# Patient Record
Sex: Male | Born: 1940
Health system: Southern US, Community
[De-identification: ages and names within clinical notes are randomized; demographics above are authoritative.]

## PROBLEM LIST (undated history)

## (undated) DIAGNOSIS — M069 Rheumatoid arthritis, unspecified: Secondary | ICD-10-CM

## (undated) DIAGNOSIS — K219 Gastro-esophageal reflux disease without esophagitis: Secondary | ICD-10-CM

## (undated) DIAGNOSIS — R55 Syncope and collapse: Secondary | ICD-10-CM

## (undated) DIAGNOSIS — E785 Hyperlipidemia, unspecified: Secondary | ICD-10-CM

## (undated) DIAGNOSIS — D7589 Other specified diseases of blood and blood-forming organs: Secondary | ICD-10-CM

## (undated) DIAGNOSIS — G4733 Obstructive sleep apnea (adult) (pediatric): Secondary | ICD-10-CM

## (undated) DIAGNOSIS — Z86010 Personal history of colonic polyps: Secondary | ICD-10-CM

## (undated) DIAGNOSIS — Z860101 Personal history of adenomatous and serrated colon polyps: Secondary | ICD-10-CM

## (undated) DIAGNOSIS — E669 Obesity, unspecified: Secondary | ICD-10-CM

## (undated) DIAGNOSIS — I6523 Occlusion and stenosis of bilateral carotid arteries: Secondary | ICD-10-CM

## (undated) DIAGNOSIS — D638 Anemia in other chronic diseases classified elsewhere: Secondary | ICD-10-CM

## (undated) DIAGNOSIS — R42 Dizziness and giddiness: Secondary | ICD-10-CM

## (undated) DIAGNOSIS — K2270 Barrett's esophagus without dysplasia: Secondary | ICD-10-CM

## (undated) HISTORY — DX: Barrett's esophagus without dysplasia: K22.70

## (undated) HISTORY — DX: Anemia in other chronic diseases classified elsewhere: D63.8

## (undated) HISTORY — DX: Obesity, unspecified: E66.9

## (undated) HISTORY — DX: Occlusion and stenosis of bilateral carotid arteries: I65.23

## (undated) HISTORY — DX: Dizziness and giddiness: R42

## (undated) HISTORY — DX: Personal history of adenomatous and serrated colon polyps: Z86.0101

## (undated) HISTORY — DX: Hyperlipidemia, unspecified: E78.5

## (undated) HISTORY — DX: Obstructive sleep apnea (adult) (pediatric): G47.33

## (undated) HISTORY — DX: Gastro-esophageal reflux disease without esophagitis: K21.9

## (undated) HISTORY — DX: Other specified diseases of blood and blood-forming organs: D75.89

## (undated) HISTORY — DX: Syncope and collapse: R55

## (undated) HISTORY — DX: Personal history of colonic polyps: Z86.010

---

## 2001-10-10 ENCOUNTER — Ambulatory Visit (HOSPITAL_COMMUNITY): Admission: RE | Admit: 2001-10-10 | Discharge: 2001-10-10 | Payer: Self-pay | Admitting: *Deleted

## 2002-03-02 HISTORY — PX: JOINT REPLACEMENT: SHX530

## 2003-10-08 ENCOUNTER — Ambulatory Visit (HOSPITAL_COMMUNITY): Admission: RE | Admit: 2003-10-08 | Discharge: 2003-10-08 | Payer: Self-pay | Admitting: Emergency Medicine

## 2008-01-09 ENCOUNTER — Inpatient Hospital Stay (HOSPITAL_COMMUNITY): Admission: RE | Admit: 2008-01-09 | Discharge: 2008-01-11 | Payer: Self-pay | Admitting: Orthopedic Surgery

## 2010-02-25 DIAGNOSIS — C4492 Squamous cell carcinoma of skin, unspecified: Secondary | ICD-10-CM

## 2010-02-25 HISTORY — DX: Squamous cell carcinoma of skin, unspecified: C44.92

## 2010-07-15 NOTE — Op Note (Signed)
NAME:  Nicholas Webb, Nicholas Webb NO.:  [id number]   MEDICAL RECORD NO.:  [id number]          PATIENT TYPE:  INP   LOCATION:  5015                         FACILITY:  [facility]   PHYSICIAN:  Mila Homer. Sherlean Foot, M.D. DATE OF BIRTH:  10/31/1940   DATE OF PROCEDURE:  01/09/2008  DATE OF DISCHARGE:                               OPERATIVE REPORT   SURGEON:  Mila Homer. Sherlean Foot, MD   ASSISTANTS:  1. Laural Benes. Su Hilt, PA-C  2. Altamese Cabal, PA-C   ANESTHESIA:  General.   PREOPERATIVE DIAGNOSIS:  Left knee osteoarthritis.   POSTOPERATIVE DIAGNOSIS:  Left knee osteoarthritis.   PROCEDURE:  Left total knee arthroplasty.   INDICATIONS FOR PROCEDURE:  The patient is a 70 year old male who failed  conservative measures for left knee osteoarthritis.   DESCRIPTION OF PROCEDURE:  The patient was laid supine, administered  general anesthesia, and placed in the supine position on the operating  room table.  Foley catheter was placed.  Left leg was prepped and draped  in the usual sterile fashion.  The extremity was exsanguinated with the  Esmarch and tourniquet was inflated to 350 mmHg and set for an hour.  I  then made a midline incision with #10 blade.  Used a new blade to make a  medial parapatellar arthrotomy and performed synovectomy.  There was  lots of synovitis.  This appeared to be either a rheumatoid versus gout  or pseudogout or some other source of systemic arthritis.  I everted the  patella, measured 25 mm thick.  I reamed down to 16 and drilled 3 lug  holes with 35-mm template and had recreated a 25-mm thickness.  I  removed the trial component, went into flexion subluxing the patella  laterally.  I used the extramedullary alignment system on the tibia and  intramedullary alignment on the femur, made a 6-degree valgus cut on the  femur, perpendicular 9-degree cut on the tibia.  I then drew out the  epicondylar axis, posterior condylar angle measured 3 degrees.  I then  sized to  a size G on the femur, pinned to 3-degree external rotation  holes, and fastened the 4:1 cutting block to the femur and made the  anterior-posterior chamfer cuts with sagittal saw.  I then used the 6  tibia, drill and keel.  I then trialed with a 6 tibia, G femur after  finishing the femur with the G finishing block.  I trialed various  polyethylenes and felt a size 12 gave perfect flexion/extension gap  balance.  I then took the knee through range of motion and had to do a  small lateral release.  I then chose these components.  I removed the  trial components and copiously irrigated.  I then cemented in the  components removing all excess cement and allowing the cement to harden  in extension.  I placed a Hemovac coming out superolaterally deep to the  arthrotomy, pain catheter coming out superficial to the arthrotomy and  superomedially.  I closed the arthrotomy with figure-of-eight #1 Vicryl  sutures, buried 0 Vicryl sutures, subcuticular 2-0 Vicryl stitches, and  skin  staples.  Dressed with Xeroform, dry sponge, sterile Webril, and  TED stocking.   COMPLICATIONS:  None.   DRAINS:  One Hemovac and one pain catheter.   ESTIMATED BLOOD LOSS:  300 mL.   TOURNIQUET TIME:  50 minutes.           ______________________________  Mila Homer Sherlean Foot, M.D.     SDL/MEDQ  D:  01/09/2008  T:  01/09/2008  Job:  [id number]

## 2010-12-02 LAB — URINALYSIS, ROUTINE W REFLEX MICROSCOPIC
Bilirubin Urine: NEGATIVE
Glucose, UA: NEGATIVE
Hgb urine dipstick: NEGATIVE
Ketones, ur: NEGATIVE
Nitrite: NEGATIVE
Protein, ur: NEGATIVE
Specific Gravity, Urine: 1.016
Urobilinogen, UA: 0.2
pH: 6.5

## 2010-12-02 LAB — URINE CULTURE
Colony Count: NO GROWTH
Culture: NO GROWTH

## 2010-12-02 LAB — BASIC METABOLIC PANEL
BUN: 7
BUN: 8
CO2: 26
CO2: 29
Calcium: 8.5
Calcium: 8.8
Chloride: 101
Chloride: 101
Creatinine, Ser: 0.68
Creatinine, Ser: 0.84
GFR calc Af Amer: >60
GFR calc Af Amer: >60
GFR calc non Af Amer: >60
GFR calc non Af Amer: >60
Glucose, Bld: 115 — ABNORMAL HIGH
Glucose, Bld: 120 — ABNORMAL HIGH
Potassium: 3.5
Potassium: 3.7
Sodium: 136
Sodium: 137

## 2010-12-02 LAB — CROSSMATCH
ABO/RH(D): O POS
Antibody Screen: NEGATIVE

## 2010-12-02 LAB — CBC
HCT: 30.6 — ABNORMAL LOW
HCT: 33.5 — ABNORMAL LOW
HCT: 39.6
Hemoglobin: 10.2 — ABNORMAL LOW
Hemoglobin: 11.2 — ABNORMAL LOW
Hemoglobin: 13.3
MCHC: 33.5
MCHC: 33.5
MCHC: 33.6
MCV: 93.3
MCV: 94.4
MCV: 94.7
Platelets: 237
Platelets: 239
Platelets: 292
RBC: 3.28 — ABNORMAL LOW
RBC: 3.54 — ABNORMAL LOW
RBC: 4.19 — ABNORMAL LOW
RDW: 15
RDW: 15.1
RDW: 15.3
WBC: 10.9 — ABNORMAL HIGH
WBC: 12.5 — ABNORMAL HIGH
WBC: 13.3 — ABNORMAL HIGH

## 2010-12-02 LAB — COMPREHENSIVE METABOLIC PANEL
ALT: 16
AST: 23
Albumin: 3.7
Alkaline Phosphatase: 72
BUN: 12
CO2: 28
Calcium: 9.9
Chloride: 105
Creatinine, Ser: 0.91
GFR calc Af Amer: >60
GFR calc non Af Amer: >60
Glucose, Bld: 105 — ABNORMAL HIGH
Potassium: 4.3
Sodium: 141
Total Bilirubin: 0.4
Total Protein: 7.2

## 2010-12-02 LAB — DIFFERENTIAL
Basophils Absolute: 0
Basophils Relative: 0
Eosinophils Absolute: 0.2
Eosinophils Relative: 2
Lymphocytes Relative: 11 — ABNORMAL LOW
Lymphs Abs: 1.2
Monocytes Absolute: 1.2 — ABNORMAL HIGH
Monocytes Relative: 11
Neutro Abs: 8.2 — ABNORMAL HIGH
Neutrophils Relative %: 75

## 2010-12-02 LAB — PROTIME-INR
INR: 1
Prothrombin Time: 13.4

## 2010-12-02 LAB — APTT: aPTT: 28

## 2010-12-02 LAB — ABO/RH: ABO/RH(D): O POS

## 2011-08-18 ENCOUNTER — Observation Stay (HOSPITAL_COMMUNITY)
Admission: EM | Admit: 2011-08-18 | Discharge: 2011-08-19 | Disposition: A | Discharge status: Home / Self Care | Payer: Medicare Other | Source: Ambulatory Visit | Attending: Emergency Medicine | Admitting: Emergency Medicine

## 2011-08-18 ENCOUNTER — Encounter (HOSPITAL_COMMUNITY): Payer: Self-pay | Admitting: Physical Medicine and Rehabilitation

## 2011-08-18 ENCOUNTER — Emergency Department (HOSPITAL_COMMUNITY): Payer: Medicare Other

## 2011-08-18 DIAGNOSIS — R079 Chest pain, unspecified: Principal | ICD-10-CM | POA: Insufficient documentation

## 2011-08-18 DIAGNOSIS — R0602 Shortness of breath: Secondary | ICD-10-CM | POA: Insufficient documentation

## 2011-08-18 DIAGNOSIS — R110 Nausea: Secondary | ICD-10-CM | POA: Insufficient documentation

## 2011-08-18 DIAGNOSIS — M069 Rheumatoid arthritis, unspecified: Secondary | ICD-10-CM | POA: Insufficient documentation

## 2011-08-18 DIAGNOSIS — R61 Generalized hyperhidrosis: Secondary | ICD-10-CM | POA: Insufficient documentation

## 2011-08-18 HISTORY — DX: Rheumatoid arthritis, unspecified: M06.9

## 2011-08-18 LAB — CBC
HCT: 42.5 % (ref 39.0–52.0)
Hemoglobin: 14.4 g/dL (ref 13.0–17.0)
MCH: 33.2 pg (ref 26.0–34.0)
MCHC: 33.9 g/dL (ref 30.0–36.0)
MCV: 97.9 fL (ref 78.0–100.0)
Platelets: 176 10*3/uL (ref 150–400)
RBC: 4.34 MIL/uL (ref 4.22–5.81)
RDW: 14.2 % (ref 11.5–15.5)
WBC: 7.7 10*3/uL (ref 4.0–10.5)

## 2011-08-18 LAB — BASIC METABOLIC PANEL
BUN: 16 mg/dL (ref 6–23)
CO2: 26 mEq/L (ref 19–32)
Calcium: 9.7 mg/dL (ref 8.4–10.5)
Chloride: 103 mEq/L (ref 96–112)
Creatinine, Ser: 0.98 mg/dL (ref 0.50–1.35)
GFR calc Af Amer: >90 mL/min (ref >90)
GFR calc non Af Amer: 81 mL/min — ABNORMAL LOW (ref >90)
Glucose, Bld: 96 mg/dL (ref 70–99)
Potassium: 4 mEq/L (ref 3.5–5.1)
Sodium: 141 mEq/L (ref 135–145)

## 2011-08-18 LAB — POCT I-STAT TROPONIN I
Troponin i, poc: 0 ng/mL (ref 0.00–0.08)
Troponin i, poc: 0 ng/mL (ref 0.00–0.08)

## 2011-08-18 MED ORDER — NITROGLYCERIN 2 % TD OINT
0.5000 [in_us] | TOPICAL_OINTMENT | Freq: Once | TRANSDERMAL | Status: AC
  Administered 2011-08-18: 0.5 [in_us] via TOPICAL
  Filled 2011-08-18: qty 1

## 2011-08-18 MED ORDER — ASPIRIN 81 MG PO CHEW
324.0000 mg | CHEWABLE_TABLET | Freq: Once | ORAL | Status: AC
  Administered 2011-08-18: 324 mg via ORAL
  Filled 2011-08-18: qty 4

## 2011-08-18 NOTE — ED Provider Notes (Signed)
History     CSN: [id number]  Arrival date & time 08/18/11  1541   First MD Initiated Contact with Patient 08/18/11 1808      Chief Complaint  Patient presents with  . Chest Pain    Patient is a 71 y.o. male presenting with chest pain. The history is provided by the patient.  Chest Pain The chest pain began 6 - 12 hours ago. Chest pain occurs intermittently. The chest pain is resolved. The pain is associated with eating. The severity of the pain is moderate. The quality of the pain is described as aching, heavy, dull and brief. The pain does not radiate. Primary symptoms include shortness of breath and nausea. Pertinent negatives for primary symptoms include no fever, no cough, no wheezing, no palpitations, no abdominal pain, no vomiting and no dizziness.  Associated symptoms include diaphoresis.  Pertinent negatives for associated symptoms include no numbness and no weakness. He tried nothing for the symptoms. Risk factors include male gender.  Pertinent negatives for past medical history include no seizures.     Past Medical History  Diagnosis Date  . Rheumatoid arthritis     No past surgical history on file.  History reviewed. No pertinent family history.  History  Substance Use Topics  . Smoking status: Never Smoker   . Smokeless tobacco: Not on file  . Alcohol Use: Yes      Review of Systems  Constitutional: Positive for diaphoresis. Negative for fever, chills, activity change and appetite change.  HENT: Negative for neck pain.   Respiratory: Positive for chest tightness and shortness of breath. Negative for cough and wheezing.   Cardiovascular: Positive for chest pain. Negative for palpitations and leg swelling.  Gastrointestinal: Positive for nausea. Negative for vomiting, abdominal pain, diarrhea and constipation.  Skin: Negative for rash and wound.  Neurological: Negative for dizziness, seizures, syncope, weakness, light-headedness, numbness and headaches.  All  other systems reviewed and are negative.    Allergies  Review of patient's allergies indicates no known allergies.  Home Medications   Current Outpatient Rx  Name Route Sig Dispense Refill  . ETANERCEPT 50 MG/ML [place] SOLN Subcutaneous Inject 50 mg into the skin once a week. thursdays    . FOLIC ACID 1 MG PO TABS Oral Take 1 mg by mouth daily.    Marland Kitchen METHOTREXATE 2.5 MG PO TABS Oral Take 15 mg by mouth once a week. Caution:Chemotherapy. Protect from light. Mondays    . ADULT MULTIVITAMIN W/MINERALS CH Oral Take 1 tablet by mouth daily.    Marland Kitchen PRILOSEC PO Oral Take 1 capsule by mouth daily.      BP 124/76  Pulse 73  Temp 98.3 F (36.8 C) (Oral)  Resp 15  SpO2 96%  Physical Exam  Nursing note and vitals reviewed. Constitutional: He appears well-developed and well-nourished.  HENT:  Head: Normocephalic and atraumatic.  Right Ear: External ear normal.  Left Ear: External ear normal.  Nose: Nose normal.  Mouth/Throat: Oropharynx is clear and moist. No oropharyngeal exudate.  Eyes: Conjunctivae are normal. Pupils are equal, round, and reactive to light.  Neck: Normal range of motion. Neck supple.  Cardiovascular: Normal rate, regular rhythm, normal heart sounds and intact distal pulses.   Pulmonary/Chest: Effort normal and breath sounds normal. No respiratory distress. He has no wheezes. He has no rales. He exhibits no tenderness.  Abdominal: Soft. Bowel sounds are normal. He exhibits no distension and no mass. There is no tenderness. There is no rebound and no  guarding.  Musculoskeletal: Normal range of motion. He exhibits no edema and no tenderness.  Neurological: He is alert. He displays normal reflexes. No cranial nerve deficit. He exhibits normal muscle tone. Coordination normal.  Skin: Skin is warm and dry. No rash noted. No erythema. No pallor.  Psychiatric: He has a normal mood and affect. His behavior is normal. Judgment and thought content normal.    ED Course  Procedures  (including critical care time)  Labs Reviewed  BASIC METABOLIC PANEL - Abnormal; Notable for the following:    GFR calc non Af Amer 81 (*)     All other components within normal limits  CBC  POCT I-STAT TROPONIN I   Dg Chest 2 View  08/18/2011  *RADIOLOGY REPORT*  Clinical Data: Chest pain  CHEST - 2 VIEW  Comparison: 01/03/2008  Findings: Cardiomediastinal silhouette is stable.  No acute infiltrate or pulmonary edema.  Stable streaky atelectasis or scarring left base.  Stable mild degenerative changes thoracic spine.  IMPRESSION: No active disease.  No significant change.  Original Report Authenticated By: Natasha Mead, M.D.     1. Chest pain      Date: 08/18/2011  Rate: 81 bpm  Rhythm: normal sinus rhythm  QRS Axis: normal  Intervals: normal  ST/T Wave abnormalities: nonspecific ST changes  Conduction Disutrbances:none  Narrative Interpretation:   Old EKG Reviewed: none available    MDM  41 M presents after several episodes of L-sided chest pain with associated dyspnea and diaphoresis. Pt believes it is heartburn; however, his "heartburn" symptoms normally do not have these associated symptoms. Pt currently chest pain-free in ED. AFVSS. Pain not reproducible on exam. 325mg  ASA administered. EKG not concerning for acute ischemia or arrythmia. Initial troponin negative. Will place pt in CDU Chest Pain observation protocol for serial troponins and exercise stress echo.         Clemetine Marker, MD 08/19/11 [phone]

## 2011-08-18 NOTE — ED Notes (Signed)
Pt reports this morning had central cp which subsided after some time. Reports initial sob, diaphoresis. No cardiac s/s at this time. Pt resting, family at bedside

## 2011-08-18 NOTE — ED Notes (Signed)
Pt presents to department from [facility] for evaluation of L sided non radiating chest pain. Onset this morning. Pt states he became diaphoretic and SOB at onset. Describes pain as pressure sensation. Denies pain upon arrival to ED. Skin clammy. He is conscious alert and oriented x4.

## 2011-08-18 NOTE — ED Notes (Signed)
AIDET performed. 

## 2011-08-18 NOTE — ED Notes (Signed)
Patient transported to X-ray 

## 2011-08-18 NOTE — ED Provider Notes (Signed)
Complains of left anterior chest pain 4 episodes today lasting less than 5 minutes each pain was nonradiating left anterior accompanied by shortness of breath and sweatiness he is presently asymptomatic. On exam alert nontoxic lungs clear auscultation heart regular rate and rhythm abdomen nondistended nontender extremities the  Doug Sou, MD 08/18/11 1926

## 2011-08-18 NOTE — ED Provider Notes (Signed)
Assumed care of patient in the CDU from Dr. Noe Gens and Dr. Ethelda Chick.   Patient is on Chest Pain Protocol.  Patient has an episode of chest pain this morning that was associated with nausea, diaphoresis, and dyspnea.  Chest pain has since resolved.  Patient does not have a history of HTN, hyperlipidemia, or DM.  No prior cardiac history.  Patient is scheduled to have a Stress Echocardiogram in the morning.  Patient has been given Aspirin.  [facility] to see patient in the morning.  9:45 PM Reassessed patient.  Patient reports that he is not having any chest pain at this time.  No acute distress.  VSS.  Heart RRR, Lungs CTAB, no lower extremity edema.    10:45 PM Patient in no acute distress.  No CP.  11:00 PM Patient signed out to Dr. Ethelda Chick.    Nicholas Lux Lee, PA-C 08/19/11 [phone]

## 2011-08-19 DIAGNOSIS — R072 Precordial pain: Secondary | ICD-10-CM

## 2011-08-19 LAB — POCT I-STAT TROPONIN I: Troponin i, poc: 0 ng/mL (ref 0.00–0.08)

## 2011-08-19 NOTE — ED Provider Notes (Signed)
I have personally seen and examined the patient.  I have discussed the plan of care with the resident.  I have reviewed the documentation on PMH/FH/Soc. History.  I have reviewed the documentation of the resident and agree.  Doug Sou, MD 08/19/11 [phone]

## 2011-08-19 NOTE — Progress Notes (Signed)
Observation review is complete. 

## 2011-08-19 NOTE — Discharge Instructions (Signed)
Your blood tests do not show any signs of a heart attack.  Your echocardiogram, does not show any signs of damage to your heart or heart failure.  Followup with your Dr. for reevaluation.  Return for worse or uncontrolled symptoms

## 2011-08-19 NOTE — ED Notes (Signed)
Phone call received stating echo was WNL

## 2011-08-19 NOTE — ED Provider Notes (Signed)
Medical screening examination/treatment/procedure(s) were conducted as a shared visit with non-physician practitioner(s) and myself.  I personally evaluated the patient during the encounter  Doug Sou, MD 08/19/11 [phone]

## 2011-08-19 NOTE — ED Notes (Signed)
Patient discharged with a family member. Patient denies pain at this time.  Patient able to verbalize understanding discharge instructions.

## 2011-08-19 NOTE — ED Notes (Signed)
Nitro patch removed. No complaints at this time. Will continue to monitor patient

## 2011-08-19 NOTE — ED Provider Notes (Signed)
Reviewed notes and echo report. Pt asx.  He has no hx of cad. He does not smoke.  He does not get cp when he walks.  His echo is nl.   Neg ces.   Will release to home.  Cheri Guppy, MD 08/19/11 1230

## 2013-03-28 DIAGNOSIS — D229 Melanocytic nevi, unspecified: Secondary | ICD-10-CM

## 2013-03-28 HISTORY — DX: Melanocytic nevi, unspecified: D22.9

## 2013-04-11 ENCOUNTER — Ambulatory Visit (HOSPITAL_BASED_OUTPATIENT_CLINIC_OR_DEPARTMENT_OTHER): Payer: Medicare Other | Attending: Family Medicine | Admitting: Radiology

## 2013-04-11 VITALS — Ht 69.0 in | Wt 200.0 lb

## 2013-04-11 DIAGNOSIS — G4733 Obstructive sleep apnea (adult) (pediatric): Secondary | ICD-10-CM | POA: Insufficient documentation

## 2013-04-15 DIAGNOSIS — G4733 Obstructive sleep apnea (adult) (pediatric): Secondary | ICD-10-CM

## 2013-04-15 NOTE — Sleep Study (Signed)
   NAME: Nicholas Webb DATE OF BIRTH:  21-Jun-1940 MEDICAL RECORD NUMBER [id number]  LOCATION: [facility]  PHYSICIAN: YOUNG,CLINTON D  DATE OF STUDY: 04/11/2013  SLEEP STUDY TYPE: Nocturnal Polysomnogram               REFERRING PHYSICIAN: Jonathon Bellows, MD  INDICATION FOR STUDY: Hypersomnia with sleep apnea   EPWORTH SLEEPINESS SCORE:   HEIGHT: 5\' 9"  (175.3 cm)  WEIGHT: 200 lb (90.719 kg)    Body mass index is 29.52 kg/(m^2).  NECK SIZE: 15 in.  MEDICATIONS: Charted for review  SLEEP ARCHITECTURE: A split study protocol. During the diagnostic phase total sleep time 120 minutes with sleep efficiency 87.3%. Stage I was 7.1%, stage II 78.8%, stage III absent, REM 14.2% of total sleep time. Sleep latency 6.5 minutes, REM latency 80.5 minutes, awake after sleep onset 11 minutes, arousal index 45.5. Bedtime medication: None  RESPIRATORY DATA: Apnea hypopnea index (AHI) 52 per hour. 104 events scored including 30 obstructive apneas and 74 hypopneas. Most events were associated with supine sleep position. REM AHI 63.5 per hour. CPAP was titrated to 12 CWP, AHI 0 per hour. He wore a medium ResMed AirFit F10 fullface mask with heated humidifier.  OXYGEN DATA: Moderate snoring before CPAP with oxygen desaturation to a nadir of 70% on room air. With CPAP control, snoring was prevented and mean oxygen saturation held 93.2% on room air.  CARDIAC DATA: Sinus rhythm with PVCs  MOVEMENT/PARASOMNIA: No significant movement disturbance, no bathroom trips  IMPRESSION/ RECOMMENDATION:   1) Severe obstructive sleep apnea/hypopnea syndrome, AHI 52 per hour with mostly supine events. Moderate snoring with oxygen desaturation to a nadir of 70% on room air. 2) Successful CPAP titration to 12 CWP, AHI 0 per hour. He wore a medium ResMed AirFit F10 fullface mask with heated humidifier. Snoring was prevented and mean oxygen saturation held 93.2% on room air.   Signed Baird Lyons,   M.D. Deneise Lever Diplomate, American Board of Sleep Medicine  ELECTRONICALLY SIGNED ON:  04/15/2013, 10:32 AM [place] PH: [phone]   FX: [phone] [place]

## 2013-05-16 ENCOUNTER — Encounter: Payer: Self-pay | Admitting: Podiatry

## 2013-05-16 ENCOUNTER — Ambulatory Visit (INDEPENDENT_AMBULATORY_CARE_PROVIDER_SITE_OTHER): Payer: Medicare Other | Admitting: Podiatry

## 2013-05-16 VITALS — BP 124/80 | HR 64 | Resp 12

## 2013-05-16 DIAGNOSIS — B351 Tinea unguium: Secondary | ICD-10-CM

## 2013-05-16 DIAGNOSIS — M79609 Pain in unspecified limb: Secondary | ICD-10-CM

## 2013-05-16 NOTE — Progress Notes (Signed)
   Subjective:    Patient ID: Nicholas Webb, male    DOB: [birth year], 73 y.o.   MRN: [id number]  HPI PT STATED BOTH GREAT TOENAIL ARE LITTLE SORE 3 MONTHS. THE TOENAILS ARE THICKER AND GETTING WORSE. TOES GET AGGRAVATED BY WEARING SHOES. TRIED TO KEEP THE TOENAILS TRIM DOWN.    Review of Systems  All other systems reviewed and are negative.       Objective:   Physical Exam I have reviewed her past history medications allergies surgeries social history pulses are palpable bilateral. Nails are sharp incurvated and painful palpation particularly the hallux bilateral.         Assessment & Plan:  Assessment: Ingrown nail hallux bilateral.  Plan: Debridement hallux nail bilateral followup with him.

## 2014-01-23 ENCOUNTER — Encounter: Payer: Self-pay | Admitting: *Deleted

## 2015-02-21 ENCOUNTER — Encounter: Payer: Self-pay | Admitting: Podiatry

## 2015-02-21 ENCOUNTER — Ambulatory Visit (INDEPENDENT_AMBULATORY_CARE_PROVIDER_SITE_OTHER): Payer: Medicare Other | Admitting: Podiatry

## 2015-02-21 DIAGNOSIS — B351 Tinea unguium: Secondary | ICD-10-CM

## 2015-02-21 DIAGNOSIS — M79676 Pain in unspecified toe(s): Secondary | ICD-10-CM | POA: Diagnosis not present

## 2015-02-25 NOTE — Progress Notes (Signed)
He presents today with chief complaint of painful elongated toenails and with toenails they seem to be growing and thick.  Objective: Vital signs are stable he is alert and oriented 3 pulses are palpable. This toenails are thick yellow dystrophic slightly mycotic and painful on palpation.  Assessment: Patient and limb secondary to sharply incurvated nails and onychomycosis.  Plan: Debridement of nails 1 through 5 bilateral covered service secondary to pain.

## 2015-08-06 ENCOUNTER — Other Ambulatory Visit: Payer: Self-pay | Admitting: Family Medicine

## 2015-08-06 DIAGNOSIS — R42 Dizziness and giddiness: Secondary | ICD-10-CM

## 2015-08-07 ENCOUNTER — Other Ambulatory Visit: Payer: Self-pay | Admitting: Family Medicine

## 2015-08-07 DIAGNOSIS — R42 Dizziness and giddiness: Secondary | ICD-10-CM

## 2015-08-12 ENCOUNTER — Ambulatory Visit
Admission: RE | Admit: 2015-08-12 | Discharge: 2015-08-12 | Disposition: A | Discharge status: Home / Self Care | Payer: Medicare Other | Source: Ambulatory Visit | Attending: Family Medicine | Admitting: Family Medicine

## 2015-08-12 ENCOUNTER — Other Ambulatory Visit: Payer: Self-pay

## 2015-08-12 DIAGNOSIS — R42 Dizziness and giddiness: Secondary | ICD-10-CM

## 2015-08-26 ENCOUNTER — Ambulatory Visit
Admission: RE | Admit: 2015-08-26 | Discharge: 2015-08-26 | Disposition: A | Discharge status: Home / Self Care | Payer: Medicare Other | Source: Ambulatory Visit | Attending: Family Medicine | Admitting: Family Medicine

## 2015-08-26 DIAGNOSIS — R42 Dizziness and giddiness: Secondary | ICD-10-CM

## 2015-10-07 DIAGNOSIS — R55 Syncope and collapse: Secondary | ICD-10-CM | POA: Insufficient documentation

## 2015-10-07 DIAGNOSIS — R42 Dizziness and giddiness: Secondary | ICD-10-CM

## 2015-10-21 ENCOUNTER — Ambulatory Visit (INDEPENDENT_AMBULATORY_CARE_PROVIDER_SITE_OTHER): Payer: Medicare Other | Admitting: Cardiology

## 2015-10-21 ENCOUNTER — Encounter: Payer: Self-pay | Admitting: Cardiology

## 2015-10-21 VITALS — BP 130/82 | HR 78 | Ht 69.0 in | Wt 191.8 lb

## 2015-10-21 DIAGNOSIS — I4510 Unspecified right bundle-branch block: Secondary | ICD-10-CM

## 2015-10-21 DIAGNOSIS — M069 Rheumatoid arthritis, unspecified: Secondary | ICD-10-CM | POA: Diagnosis not present

## 2015-10-21 DIAGNOSIS — R42 Dizziness and giddiness: Secondary | ICD-10-CM | POA: Diagnosis not present

## 2015-10-21 DIAGNOSIS — R9431 Abnormal electrocardiogram [ECG] [EKG]: Secondary | ICD-10-CM | POA: Diagnosis not present

## 2015-10-21 NOTE — Patient Instructions (Signed)
Medication Instructions:  The current medical regimen is effective;  continue present plan and medications.  Testing/Procedures: Your physician has requested that you have an echocardiogram. Echocardiography is a painless test that uses sound waves to create images of your heart. It provides your doctor with information about the size and shape of your heart and how well your heart's chambers and valves are working. This procedure takes approximately one hour. There are no restrictions for this procedure.  Follow-Up: Follow up as needed after the above testing.  If you need a refill on your cardiac medications before your next appointment, please call your pharmacy.  Thank you for choosing [facility]!!     

## 2015-10-21 NOTE — Progress Notes (Signed)
Cardiology Office Note    Date:  10/21/2015   ID:  Nicholas Webb, DOB 1940/09/23, MRN [id number]  PCP:  Jonathon Bellows, MD  Cardiologist:   Candee Furbish, MD     History of Present Illness:  Nicholas Webb is a 75 y.o. male here for the evaluation of dizziness, worsening presyncope at the request of Dr. Maurice Small.  In 2007 had a normal exercise treadmill test after an episode of atypical chest discomfort. Further GI evaluation revealed Barrett's esophagus, GERD.  Carotid Dopplers were also recently performed which showed minimal plaque.  States that he feels dizziness when laying down or when sitting up. Seems to be positional. When puts CPAP on, lay down, dizzy/swimmy headed. During day, if looking up in chair may feel dizzy. Loosing balance. When walking his hallway at home sometimes he bangs into the wall. This has been present for a few months.    Occasionally he will feel a slight chest discomfort on sitting for instance with no radiation lasting a few seconds duration, fleeting. Usually goes on about his day without any difficulty. No SOB. His right ankle swells occasionally. Has rheumatoid arthritis.  Cataracts 10/2014.     Past Medical History:  Diagnosis Date  . Anemia of chronic disease   . Atherosclerosis of both carotid arteries   . Barrett's esophagus   . Dizziness   . GERD (gastroesophageal reflux disease)   . Hx of adenomatous colonic polyps   . Hyperlipidemia   . Macrocytosis without anemia   . Obesity   . OSA (obstructive sleep apnea)   . Rheumatoid arthritis(714.0)   . Syncope     No past surgical history on file.  Current Medications: Outpatient Medications Prior to Visit  Medication Sig Dispense Refill  . Adalimumab (HUMIRA) 40 MG/0.8ML PSKT TAKE SUBCUTANEOUS EVERY TWO WEEKS    . amoxicillin (AMOXIL) 500 MG capsule Use as directed for dental procedures    . folic acid (FOLVITE) 1 MG tablet Take 1 mg by mouth daily.    . methotrexate  (RHEUMATREX) 2.5 MG tablet Caution:Chemotherapy. Protect from light. Mondays  Patient takes 5 tablets by mouth on Sundays  And 4 tablets by mouth on Mondays.    . Multiple Vitamin (MULTIVITAMIN WITH MINERALS) TABS Take 1 tablet by mouth daily.    Marland Kitchen etanercept (ENBREL) 50 MG/ML injection Inject 50 mg into the skin once a week. thursdays    . Omeprazole (PRILOSEC PO) Take 1 capsule by mouth daily.     No facility-administered medications prior to visit.      Allergies:   Review of patient's allergies indicates no known allergies.   Social History   Social History  . Marital status: Married    Spouse name: N/A  . Number of children: N/A  . Years of education: N/A   Social History Main Topics  . Smoking status: Never Smoker  . Smokeless tobacco: None  . Alcohol use Yes  . Drug use: No  . Sexual activity: Not Asked   Other Topics Concern  . None   Social History Narrative  . None    Worked as a Biochemist, clinical.  Family History:  The patient's family history includes Cancer in his father; Stroke in his mother.   ROS:   Please see the history of present illness.    ROS All other systems reviewed and are negative.   PHYSICAL EXAM:   VS:  BP 130/82   Pulse 78   Ht  5\' 9"  (1.753 m)   Wt 191 lb 12.8 oz (87 kg)   BMI 28.32 kg/m    GEN: Well nourished, well developed, in no acute distress  HEENT: normal  Neck: no JVD, carotid bruits, or masses Cardiac: RRR; no murmurs, rubs, or gallops,no edema  Respiratory:  clear to auscultation bilaterally, normal work of breathing GI: soft, nontender, nondistended, + BS MS: no deformity or atrophy  Skin: warm and dry, no rash Neuro:  Alert and Oriented x 3, Strength and sensation are intact Psych: euthymic mood, full affect  Wt Readings from Last 3 Encounters:  10/21/15 191 lb 12.8 oz (87 kg)  04/11/13 200 lb (90.7 kg)      Studies/Labs Reviewed:   EKG:  EKG is ordered today.  The ekg ordered today demonstrates  10/21/15-right bundle branch block, sinus rhythm, 78, no other abnormality. Personally viewed  Recent Labs: No results found for requested labs within last 8760 hours.   White count 7.8, hemoglobin 13.8, platelets 244. Creatinine 0.89, sodium 140, potassium 4.2, albumin 4.4. Total cholesterol 206, LDL 132, HDL 47  Lipid Panel No results found for: CHOL, TRIG, HDL, CHOLHDL, VLDL, LDLCALC, LDLDIRECT  Additional studies/ records that were reviewed today include:  Prior office notes reviewed, records reviewed, EKG, lab work reviewed  Carotid u/s: IMPRESSION: Minor carotid atherosclerosis. No hemodynamically significant ICA stenosis. Degree of narrowing less than 50% bilaterally.  Patent antegrade vertebral flow bilaterally.  Head CT: Normal for age non contrast CT appearance of the brain.  Orthostatics Lying down-119/73, pulse 71 Sitting-117/78 pulse 72. Upon motion of sitting up from a laying down position he felt lightheaded Standing 107/68 pulse 88 Standing for 3 minutes 119/74 pulse 82   ASSESSMENT:    1. Dizziness   2. Abnormal EKG   3. Right bundle branch block   4. Disequilibrium   5. Rheumatoid arthritis involving multiple sites, unspecified rheumatoid factor presence (HCC)      PLAN:  In order of problems listed above:  Dizziness  - Disequilibrium, vertigo-like symptoms. These can occur when actively laying down in the bed or turning his head or while sitting and looking up for instance. Gets a sensation of "swimmy headedness". Reassuring carotid Dopplers. Checking echocardiogram to ensure proper structure and function especially in the setting of his right bundle branch block. I do not think that this is an electrical issue or a primary cardiac issue. I think this is in relation to his equilibrium. He did have cataract surgery approximately 1 year ago. Perhaps a visual change has sparked this change in symptoms. One could consider vestibular therapy. He has not had  any prior strokes, no prior heart attacks.  Abnormal EKG/right bundle branch block  - Should not increase mortality. We will check an echocardiogram to insure proper structure and function.  Atypical chest pain  - Occasionally occurs, fleeting, usually at rest when sitting. Nonexertional. Likely GERD related or perhaps musculoskeletal. Does not sound like a cardiac etiology.  GERD  - Currently on Nexium  Rheumatoid arthritis  - Humira, methotrexate.    Medication Adjustments/Labs and Tests Ordered: Current medicines are reviewed at length with the patient today.  Concerns regarding medicines are outlined above.  Medication changes, Labs and Tests ordered today are listed in the Patient Instructions below. Patient Instructions  Medication Instructions:  The current medical regimen is effective;  continue present plan and medications.  Testing/Procedures: Your physician has requested that you have an echocardiogram. Echocardiography is a painless test that uses sound  waves to create images of your heart. It provides your doctor with information about the size and shape of your heart and how well your heart's chambers and valves are working. This procedure takes approximately one hour. There are no restrictions for this procedure.  Follow-Up: Follow up as needed after the above testing.  If you need a refill on your cardiac medications before your next appointment, please call your pharmacy.  Thank you for choosing [facility]!!        Signed, Candee Furbish, MD  10/21/2015 12:08 PM    [address], [place], [place]  [address] Phone: [phone]; Fax: [phone]

## 2015-11-05 ENCOUNTER — Ambulatory Visit (HOSPITAL_COMMUNITY): Payer: Medicare Other | Attending: Cardiovascular Disease

## 2015-11-05 ENCOUNTER — Other Ambulatory Visit: Payer: Self-pay

## 2015-11-05 DIAGNOSIS — E785 Hyperlipidemia, unspecified: Secondary | ICD-10-CM | POA: Insufficient documentation

## 2015-11-05 DIAGNOSIS — I429 Cardiomyopathy, unspecified: Secondary | ICD-10-CM | POA: Diagnosis present

## 2015-11-05 DIAGNOSIS — R55 Syncope and collapse: Secondary | ICD-10-CM | POA: Insufficient documentation

## 2015-11-05 DIAGNOSIS — I517 Cardiomegaly: Secondary | ICD-10-CM | POA: Insufficient documentation

## 2015-11-05 DIAGNOSIS — I4510 Unspecified right bundle-branch block: Secondary | ICD-10-CM | POA: Insufficient documentation

## 2015-11-05 DIAGNOSIS — R42 Dizziness and giddiness: Secondary | ICD-10-CM | POA: Diagnosis not present

## 2015-11-05 DIAGNOSIS — G4733 Obstructive sleep apnea (adult) (pediatric): Secondary | ICD-10-CM | POA: Insufficient documentation

## 2015-11-05 DIAGNOSIS — R9431 Abnormal electrocardiogram [ECG] [EKG]: Secondary | ICD-10-CM | POA: Insufficient documentation

## 2015-12-17 DIAGNOSIS — C4491 Basal cell carcinoma of skin, unspecified: Secondary | ICD-10-CM

## 2015-12-17 HISTORY — DX: Basal cell carcinoma of skin, unspecified: C44.91

## 2015-12-20 DIAGNOSIS — H8112 Benign paroxysmal vertigo, left ear: Secondary | ICD-10-CM | POA: Insufficient documentation

## 2016-01-13 ENCOUNTER — Ambulatory Visit: Payer: Medicare Other | Admitting: Physical Therapy

## 2016-01-21 ENCOUNTER — Ambulatory Visit: Payer: Medicare Other | Attending: Otolaryngology | Admitting: Rehabilitative and Restorative Service Providers"

## 2016-01-21 ENCOUNTER — Encounter: Payer: Self-pay | Admitting: Rehabilitative and Restorative Service Providers"

## 2016-01-21 DIAGNOSIS — R2681 Unsteadiness on feet: Secondary | ICD-10-CM | POA: Diagnosis present

## 2016-01-21 DIAGNOSIS — H8113 Benign paroxysmal vertigo, bilateral: Secondary | ICD-10-CM | POA: Diagnosis present

## 2016-01-21 NOTE — Therapy (Signed)
[facility] [address] [place], [place], [address] Phone: [phone]   Fax:  [phone]  Physical Therapy Evaluation  Patient Details  Name: Nicholas Webb MRN: [id number] Date of Birth: 12/19/1940 Referring Provider:  Melida Quitter, MD  Encounter Date: 01/21/2016      PT End of Session - 01/21/16 2042    Visit Number 1   Number of Visits 6   Date for PT Re-Evaluation 02/20/16   Authorization Type G code every 10th visit   PT Start Time 0935   PT Stop Time 1010   PT Time Calculation (min) 35 min   Activity Tolerance Other (comment)  limited by nausea; held on further treatment/assessment today due to nausea.   Behavior During Therapy [facility] for tasks assessed/performed      Past Medical History:  Diagnosis Date  . Anemia of chronic disease   . Atherosclerosis of both carotid arteries   . Barrett's esophagus   . Dizziness   . GERD (gastroesophageal reflux disease)   . Hx of adenomatous colonic polyps   . Hyperlipidemia   . Macrocytosis without anemia   . Obesity   . OSA (obstructive sleep apnea)   . Rheumatoid arthritis(714.0)   . Syncope     Past Surgical History:  Procedure Laterality Date  . JOINT REPLACEMENT  2004   left    There were no vitals filed for this visit.       Subjective Assessment - 01/21/16 0938    Subjective The patient notes onset of room spinning dizziness over 6 months ago.  He has intermittent episodes that comes and go and clear within a few seconds.  He reports when at Dr. Redmond Baseman' office, he had spinning when sitting up from mat.    After he gets oriented for the day, everything settles and he feels okay.   He has not fallen, however does note occasional episodes of imbalance due to vertigo.    Patient Stated Goals Reduce dizziness.    Currently in Pain? No/denies  L4-L5 low back pain per patient--will monitor, but no goal to follow.  Chronic in nature.              [facility] PT Assessment - 01/21/16 0942      Assessment   Medical Diagnosis BPPV   Referring Provider  Melida Quitter, MD   Onset Date/Surgical Date --  over 6 months   Prior Therapy none     Precautions   Precautions Fall  notes imbalance     Balance Screen   Has the patient fallen in the past 6 months No   Has the patient had a decrease in activity level because of a fear of falling?  No   Is the patient reluctant to leave their home because of a fear of falling?  No     Home Environment   Living Environment Private residence     Prior Function   Level of Independence Independent            Vestibular Assessment - 01/21/16 0944      Vestibular Assessment   General Observation walks indep without device     Symptom Behavior   Type of Dizziness Spinning   Frequency of Dizziness intermittently   Duration of Dizziness seconds   Aggravating Factors Mornings;Turning head quickly   Relieving Factors Lying supine;Head stationary     Occulomotor Exam   Occulomotor Alignment Abnormal  R eye hypertropia   Spontaneous Absent  Gaze-induced Absent   Smooth Pursuits Intact     Vestibulo-Occular Reflex   VOR 1 Head Only (x 1 viewing) self regulated pace provokes neck pain     Positional Testing   Dix-Hallpike Dix-Hallpike Right;Dix-Hallpike Left   Horizontal Canal Testing Horizontal Canal Right;Horizontal Canal Left     Dix-Hallpike Right   Dix-Hallpike Right Duration 15 seconds   Dix-Hallpike Right Symptoms Upbeat, right rotatory nystagmus  smaller amplitude     Dix-Hallpike Left   Dix-Hallpike Left Duration 25+ seconds   Dix-Hallpike Left Symptoms Upbeat, left rotatory nystagmus  severe, large amplitude     Horizontal Canal Right   Horizontal Canal Right Duration noted a mild nystagmus that cleared quickly   Horizontal Canal Right Symptoms Normal     Horizontal Canal Left   Horizontal Canal Left Duration none   Horizontal Canal Left Symptoms Normal                 Vestibular Treatment/Exercise - 01/21/16 1002      Vestibular Treatment/Exercise   Vestibular Treatment Provided Canalith Repositioning   Canalith Repositioning Epley Manuever Left;Canal Roll Left      EPLEY MANUEVER LEFT   Number of Reps  1   Overall Response  --  repositioned into horizontal canal    RESPONSE DETAILS LEFT patient has limited neck ROM for extension, otoconia repositioned into L horizontal canal per retest demonstrating geotropic nystagmus     Canal Roll Left   Number of Reps  1   Overall Response  --  nausea occurred, did not retest   Response Details  Patient with diaphoresis.Evelina Bucy BP=142/88, HR=72               PT Education - 01/21/16 2041    Education provided Yes   Education Details Petra Kuba of BPPV, goals for PT   Person(s) Educated Patient   Methods Explanation;Demonstration;Handout   Comprehension Verbalized understanding;Returned demonstration          PT Short Term Goals - 01/21/16 2043      PT SHORT TERM GOAL #1   Title STGs=LTGs           PT Long Term Goals - 01/21/16 2043      PT LONG TERM GOAL #1   Title The patient will have negative positional testing indicating resolution of BPPV.   Baseline Target date 02/19/2016   Time 4   Period Weeks     PT LONG TERM GOAL #2   Title The patient will be further assessed on gait/balance and HEP established as indicated.   Baseline Target date 02/19/2016   Time 4   Period Weeks     PT LONG TERM GOAL #3   Title The patient will reduce dizziness handicap index from 32% to < or equal to 18% to demo improved self perception of dizziness.   Baseline Target date 02/19/2016   Time 4   Period Weeks               Plan - 01/21/16 2058    Clinical Impression Statement The patient is a 75 year old male initially presenting today with positional testing consistent with L posterior canalithiasis and also R posterior canalithiasis (mild compared to L).  PT  initiated L epley's for canolith repositoining.  Otoconia repositioned into L horiozntal canal per the changing nystagmus to geotropic nystagmus when retesting (possibly occurred due to difficulty with extension during Epley's due to neck tightness).  PT treated L horizontal canlithiasis.  Did not retest today due to nausea.   Educated patient on multi-canal BPPV and being cautious with quick head movements.  PT to further assess/treat at next visit.    Rehab Potential Good   PT Frequency 2x / week   PT Duration 4 weeks   PT Treatment/Interventions ADLs/Self Care Home Management;Patient/family education;Gait training;Neuromuscular re-education;Vestibular;Canalith Repostioning;Therapeutic activities;Balance training;Therapeutic exercise   PT Next Visit Plan Reassess positional testing and treat as indicated; assess balance/gait and provide HEP as indicated.   Consulted and Agree with Plan of Care Patient      Patient will benefit from skilled therapeutic intervention in order to improve the following deficits and impairments:  Dizziness, Decreased balance  Visit Diagnosis: BPPV (benign paroxysmal positional vertigo), bilateral  Unsteadiness on feet     Problem List Patient Active Problem List   Diagnosis Date Noted  . Syncope   . Dizziness     Esparanza Krider, PT 01/21/2016, 9:09 PM  [facility] [address] [place], [place], [address] Phone: [phone]   Fax:  [phone]  Name: SOHAM GODETTE MRN: [id number] Date of Birth: [birth year]

## 2016-01-27 ENCOUNTER — Telehealth: Payer: Self-pay | Admitting: Rehabilitative and Restorative Service Providers"

## 2016-01-27 ENCOUNTER — Ambulatory Visit: Payer: Medicare Other | Admitting: Rehabilitative and Restorative Service Providers"

## 2016-01-27 DIAGNOSIS — H8113 Benign paroxysmal vertigo, bilateral: Secondary | ICD-10-CM | POA: Diagnosis not present

## 2016-01-27 DIAGNOSIS — R2681 Unsteadiness on feet: Secondary | ICD-10-CM

## 2016-01-27 NOTE — Patient Instructions (Signed)
Habituation - Tip Card  1.The goal of habituation training is to assist in decreasing symptoms of vertigo, dizziness, or nausea provoked by specific head and body motions. 2.These exercises may initially increase symptoms; however, be persistent and work through symptoms. With repetition and time, the exercises will assist in reducing or eliminating symptoms. 3.Exercises should be stopped and discussed with the therapist if you experience any of the following: - Sudden change or fluctuation in hearing - New onset of ringing in the ears, or increase in current intensity - Any fluid discharge from the ear - Severe pain in neck or back - Extreme nausea  Copyright  VHI. All rights reserved.   Habituation - Rolling   With pillow under head, start on back. Roll to your right side.  Hold until dizziness stops, plus 20 seconds and then roll to the left side.  Hold until dizziness stops, plus 20 seconds.  Repeat sequence 5 times per session. Do 2 sessions per day.  Copyright  VHI. All rights reserved.    REPEAT UNTIL YOU HAVE 2 DAYS WITHOUT DIZZINESS OR NAUSEA DURING EXERCISE.   Special Instructions: Exercises may bring on mild to moderate symptoms of dizziness and nausea that resolve within 30 minutes of completing exercises. If symptoms are lasting longer than 30 minutes, modify your exercises by:  >decreasing the # of times you complete each activity >ensuring your symptoms return to baseline before moving onto the next exercise >dividing up exercises so you do not do them all in one session, but multiple short sessions throughout the day >doing them once a day until symptoms improve

## 2016-01-27 NOTE — Telephone Encounter (Signed)
[name] has multi-canal BPPV and has nausea with canolith repositioning maneuvers.   He may benefit from anti-nausea medication before further treatment.  Please call prescription to [facility] on [address] if you agree. Thank you, Rudell Cobb, MPT [phone] phone

## 2016-01-27 NOTE — Therapy (Signed)
[facility] [address] [place], [place], [address] Phone: [phone]   Fax:  [phone]  Physical Therapy Treatment  Patient Details  Name: Nicholas Webb MRN: [id number] Date of Birth: [birth year] Referring Provider:  Melida Quitter, MD  Encounter Date: 01/27/2016      PT End of Session - 01/27/16 1241    Visit Number 2   Number of Visits 6   Date for PT Re-Evaluation 02/20/16   Authorization Type G code every 10th visit   PT Start Time 1230   PT Stop Time 1315   PT Time Calculation (min) 45 min   Activity Tolerance Other (comment)  limited by nausea; held on further treatment/assessment today due to nausea.   Behavior During Therapy [facility] for tasks assessed/performed      Past Medical History:  Diagnosis Date  . Anemia of chronic disease   . Atherosclerosis of both carotid arteries   . Barrett's esophagus   . Dizziness   . GERD (gastroesophageal reflux disease)   . Hx of adenomatous colonic polyps   . Hyperlipidemia   . Macrocytosis without anemia   . Obesity   . OSA (obstructive sleep apnea)   . Rheumatoid arthritis(714.0)   . Syncope     Past Surgical History:  Procedure Laterality Date  . JOINT REPLACEMENT  2004   left    There were no vitals filed for this visit.      Subjective Assessment - 01/27/16 1241    Subjective The patient feels lightheaded and nauseous since last treatment intermittently.  He c/o head stuffiness and notes it's worse with bending forward and return to sitting.  He has had 2 episodes of room spinning when rolling in bed since last Tuesday.     Patient Stated Goals Reduce dizziness.                 Vestibular Assessment - 01/27/16 1245      Positional Testing   Dix-Hallpike Dix-Hallpike Right;Dix-Hallpike Left   Sidelying Test Sidelying Right;Sidelying Left   Horizontal Canal Testing Horizontal Canal Right;Horizontal Canal Left     Sidelying Right   Sidelying Right Duration Trace of dizziness described as room spinning + motion   Sidelying Right Symptoms --  Not observed, cleared quickly      Sidelying Left   Sidelying Left Duration none initially, then after 25 seconds, developed an apogeotropic nystagmus x 30 seconds that was mild in intensity.   Sidelying Left Symptoms --  apogeotropic while in L sidelying     Horizontal Canal Right   Horizontal Canal Right Duration none initially; see below- elicited a mild geotropic nystagmus with R rolling.   Horizontal Canal Right Symptoms Normal     Horizontal Canal Left   Horizontal Canal Left Duration none initially when emphasizing 30 degree neck flexion; when using 1 pillow able to elicit strong geotroic to left nystagmus that is intense and creates some nausea.    Horizontal Canal Left Symptoms Normal                  Vestibular Treatment/Exercise - 01/27/16 1254      Vestibular Treatment/Exercise   Vestibular Treatment Provided Habituation;Gaze   Canalith Repositioning Canal Roll Left   Habituation Exercises Horizontal Roll;Brandt Daroff     Canal Roll Left   Number of Reps  1     Brandt Daroff   Number of Reps  2   Symptom Description  Repeated and noted an  apogeotropic nystagmus when moving into L sidelying.     Horizontal Roll   Number of Reps  5   Symptom Description  lightheadedness gets better with repetition               PT Education - 01/27/16 1433    Education provided Yes   Education Details self treatment with habituation for HEP (rolling)   Person(s) Educated Patient   Methods Explanation;Demonstration;Handout   Comprehension Verbalized understanding;Returned demonstration          PT Short Term Goals - 01/21/16 2043      PT SHORT TERM GOAL #1   Title STGs=LTGs           PT Long Term Goals - 01/21/16 2043      PT LONG TERM GOAL #1   Title The patient will have negative positional testing indicating resolution of BPPV.    Baseline Target date 02/19/2016   Time 4   Period Weeks     PT LONG TERM GOAL #2   Title The patient will be further assessed on gait/balance and HEP established as indicated.   Baseline Target date 02/19/2016   Time 4   Period Weeks     PT LONG TERM GOAL #3   Title The patient will reduce dizziness handicap index from 32% to < or equal to 18% to demo improved self perception of dizziness.   Baseline Target date 02/19/2016   Time 4   Period Weeks               Plan - 01/27/16 1308    Clinical Impression Statement The patient continues with persistent L horizontal canalithiasis-- may have other cupulolithiasis, however need to clear L horizontal canalithiasis to have accurate assessment.  Patient not responding to horizontal roll, therefore added habituation to HEP.    PT Treatment/Interventions ADLs/Self Care Home Management;Patient/family education;Gait training;Neuromuscular re-education;Vestibular;Canalith Repostioning;Therapeutic activities;Balance training;Therapeutic exercise   PT Next Visit Plan Reassess positional testing and treat as indicated; assess balance/gait and provide HEP as indicated.   Consulted and Agree with Plan of Care Patient      Patient will benefit from skilled therapeutic intervention in order to improve the following deficits and impairments:  Dizziness, Decreased balance  Visit Diagnosis: BPPV (benign paroxysmal positional vertigo), bilateral  Unsteadiness on feet     Problem List Patient Active Problem List   Diagnosis Date Noted  . Syncope   . Dizziness     Valory Wetherby, PT 01/27/2016, 2:34 PM  [facility] [address] [place], [place], [address] Phone: [phone]   Fax:  [phone]  Name: Nicholas Webb MRN: [id number] Date of Birth: 11-Nov-1940

## 2016-01-31 ENCOUNTER — Ambulatory Visit: Payer: Medicare Other | Admitting: Rehabilitative and Restorative Service Providers"

## 2016-02-10 ENCOUNTER — Ambulatory Visit: Payer: Medicare Other | Admitting: Rehabilitative and Restorative Service Providers"

## 2016-02-14 ENCOUNTER — Encounter: Payer: Medicare Other | Admitting: Rehabilitative and Restorative Service Providers"

## 2016-02-17 ENCOUNTER — Ambulatory Visit: Payer: Medicare Other | Attending: Otolaryngology | Admitting: Rehabilitative and Restorative Service Providers"

## 2016-02-17 DIAGNOSIS — R2681 Unsteadiness on feet: Secondary | ICD-10-CM | POA: Insufficient documentation

## 2016-02-17 DIAGNOSIS — H8113 Benign paroxysmal vertigo, bilateral: Secondary | ICD-10-CM | POA: Diagnosis not present

## 2016-02-17 NOTE — Patient Instructions (Addendum)
Habituation - Tip Card  1.The goal of habituation training is to assist in decreasing symptoms of vertigo, dizziness, or nausea provoked by specific head and body motions. 2.These exercises may initially increase symptoms; however, be persistent and work through symptoms. With repetition and time, the exercises will assist in reducing or eliminating symptoms. 3.Exercises should be stopped and discussed with the therapist if you experience any of the following: - Sudden change or fluctuation in hearing - New onset of ringing in the ears, or increase in current intensity - Any fluid discharge from the ear - Severe pain in neck or back - Extreme nausea  Copyright  VHI. All rights reserved.   Habituation - Rolling   With pillow under head, start on back. Roll to your right side.  Hold until dizziness stops, plus 20 seconds and then roll to the left side.  Hold until dizziness stops, plus 20 seconds.  Repeat sequence 5 times per session. Do 2 sessions per day.  Copyright  VHI. All rights reserved.   Habituation - Sit to Side-Lying   Sit on edge of bed. Lie down onto the right side and hold until dizziness stops, plus 20 seconds.  Return to sitting and wait until dizziness stops, plus 20 seconds.  Repeat to the left side. Repeat sequence 3 times per session. Do 2 sessions per day.  Copyright  VHI. All rights reserved.   Gaze Stabilization: Sitting    Keeping eyes on target held in hand 2-3 feet away, and move head side to side for _30___ seconds. Do __2-3__ sessions per day.  Copyright  VHI. All rights reserved.   Gaze Stabilization: Tip Card  1.Target must remain in focus, not blurry, and appear stationary while head is in motion. 2.Perform exercises with small head movements (45 to either side of midline). 3.Increase speed of head motion so long as target is in focus. 4.If you wear eyeglasses, be sure you can see target through lens (therapist will give specific instructions  for bifocal / progressive lenses). 5.These exercises may provoke dizziness or nausea. Work through these symptoms. If too dizzy, slow head movement slightly. Rest between each exercise. 6.Exercises demand concentration; avoid distractions.  Copyright  VHI. All rights reserved.     BALANCE:     Feet Heel-Toe "Tandem", Varied Arm Positions - Eyes Open   With eyes open, right foot directly in front of the other, arms at your side, look straight ahead at a stationary object. Hold __30__ seconds, then switch feet. Repeat _3___ times each side per session. Do __1-2__ sessions per day. CAN BEGIN BY LEAVING SOME SPACE BETWEEN FEET.  Copyright  VHI. All rights reserved.   Feet Together, Varied Arm Positions - Eyes Closed    Stand with feet together and arms out. Close eyes and visualize upright position. Hold _30___ seconds. Repeat __3__ times per session. Do _1-2___ sessions per day.  Copyright  VHI. All rights reserved.    Standing On One Leg Without Support   Stand on one leg in neutral spine with support through countertop or chair back. Hold __10-15__ seconds. Repeat on other leg. Do _3___ repetitions, _1-2 times per day.  http://bt.exer.us/36   Copyright  VHI. All rights reserved.    Side-Stepping   Walk to left side with eyes open. Take even steps, leading with same foot. Make sure each foot lifts off the floor. Repeat in opposite direction. Repeat for _10 steps x 3 sets up/down hallway.  Do _1-2___ sessions per day.  Copyright  VHI. All rights reserved.

## 2016-02-17 NOTE — Therapy (Signed)
[facility] [address] [place], [place], [address] Phone: [phone]   Fax:  [phone]  Physical Therapy Treatment  Patient Details  Name: QUATEZ HILT MRN: [id number] Date of Birth: [birth year] Referring Provider:  Melida Quitter, MD  Encounter Date: 02/17/2016      PT End of Session - 02/17/16 1023    Visit Number 3   Number of Visits 6   Date for PT Re-Evaluation 02/20/16   Authorization Type G code every 10th visit   PT Start Time 1020   PT Stop Time 1050   PT Time Calculation (min) 30 min   Activity Tolerance Patient tolerated treatment well   Behavior During Therapy [facility] for tasks assessed/performed      Past Medical History:  Diagnosis Date  . Anemia of chronic disease   . Atherosclerosis of both carotid arteries   . Barrett's esophagus   . Dizziness   . GERD (gastroesophageal reflux disease)   . Hx of adenomatous colonic polyps   . Hyperlipidemia   . Macrocytosis without anemia   . Obesity   . OSA (obstructive sleep apnea)   . Rheumatoid arthritis(714.0)   . Syncope     Past Surgical History:  Procedure Laterality Date  . JOINT REPLACEMENT  2004   left    There were no vitals filed for this visit.      Subjective Assessment - 02/17/16 1020    Subjective The patient reports that he travelled some and did not do HEP.  He tried to do HEP and did not bring on dizziness.  He is noting intermittent lightheaded sensation, but no further spinning.    Patient Stated Goals Reduce dizziness.    Currently in Pain? No/denies                Vestibular Assessment - 02/17/16 1024      Positional Testing   Sidelying Test Sidelying Right;Sidelying Left   Horizontal Canal Testing Horizontal Canal Right;Horizontal Canal Left     Sidelying Right   Sidelying Right Duration 3 beats (<5 seconds)     Sidelying Left   Sidelying Left Duration none   Sidelying Left Symptoms No nystagmus      Horizontal Canal Right   Horizontal Canal Right Duration none   Horizontal Canal Right Symptoms Normal     Horizontal Canal Left   Horizontal Canal Left Duration none   Horizontal Canal Left Symptoms Normal                 [facility] Adult PT Treatment/Exercise - 02/17/16 1051      Neuro Re-ed    Neuro Re-ed Details  Prescribed balance HEP after performing Habituation and Gaze HEP based on tasks that were challenging in the clinic.  HEP to include: tandem stance (beginning with partial tandem and working towards more narrow base), single leg stance, corner stand with feet together and eyes closed, and sidestepping.          Vestibular Treatment/Exercise - 02/17/16 1030      Vestibular Treatment/Exercise   Vestibular Treatment Provided Habituation   Habituation Exercises [name]   Gaze Exercises X1 Viewing Horizontal     [name]   Number of Reps  2   Symptom Description  Repeated and resolved R sidelying nystagmus on 2nd repetition     X1 Viewing Horizontal   Foot Position seated   Comments tolerates 15 reps without dizziness with PT providing demo and verbal cues for  increased speed.                 PT Short Term Goals - 01/21/16 2043      PT SHORT TERM GOAL #1   Title STGs=LTGs           PT Long Term Goals - 02/17/16 1052      PT LONG TERM GOAL #1   Title The patient will have negative positional testing indicating resolution of BPPV.   Baseline Target date 02/19/2016   Time 4   Period Weeks     PT LONG TERM GOAL #2   Title The patient will be further assessed on gait/balance and HEP established as indicated.   Baseline Provided HEP on 02/17/2016 based on which tasks were challenging in clinic.    Time 4   Period Weeks   Status Achieved     PT LONG TERM GOAL #3   Title The patient will reduce dizziness handicap index from 32% to < or equal to 18% to demo improved self perception of dizziness.   Baseline Target date 02/19/2016    Time 4   Period Weeks               Plan - 02/17/16 1053    Clinical Impression Statement The patient has trace vertigo noted with R sidelying test (<5 seconds of nystagmus).  Provided habituation to address and checked balance activities in the clinic.  HEP provided to address deficits and PT to f/u in 2 weeks to ensure progressing well.    Rehab Potential Good   PT Frequency 2x / week   PT Duration 4 weeks   PT Treatment/Interventions ADLs/Self Care Home Management;Patient/family education;Gait training;Neuromuscular re-education;Vestibular;Canalith Repostioning;Therapeutic activities;Balance training;Therapeutic exercise   PT Next Visit Plan Check HEP, ensure vertigo resolved/ reassess, discharge (cert and G code due).    Consulted and Agree with Plan of Care Patient      Patient will benefit from skilled therapeutic intervention in order to improve the following deficits and impairments:  Dizziness, Decreased balance  Visit Diagnosis: BPPV (benign paroxysmal positional vertigo), bilateral  Unsteadiness on feet     Problem List Patient Active Problem List   Diagnosis Date Noted  . Syncope   . Dizziness     Omaree Fuqua, PT 02/17/2016, 10:56 AM  [facility] [address] [place], [place], [address] Phone: [phone]   Fax:  [phone]  Name: JARRY MIHALEK MRN: [id number] Date of Birth: 10/14/1940

## 2016-03-12 ENCOUNTER — Ambulatory Visit: Payer: Medicare Other | Admitting: Rehabilitative and Restorative Service Providers"

## 2016-03-13 ENCOUNTER — Telehealth: Payer: Self-pay | Admitting: Rehabilitative and Restorative Service Providers"

## 2016-03-13 NOTE — Telephone Encounter (Signed)
The patient called to cancel due to sinus infection.  He cancelled remaining visits because dizziness improved.  PT returned call and plans to d/c.  Schyler Butikofer, PT

## 2016-04-03 ENCOUNTER — Encounter: Payer: Self-pay | Admitting: Rehabilitative and Restorative Service Providers"

## 2016-04-03 NOTE — Therapy (Signed)
[facility] [address] [place], [place], [address] Phone: [phone]   Fax:  [phone]  Patient Details  Name: Nicholas Webb MRN: [id number] Date of Birth: 16-May-1940 Referring Provider:  No ref. provider found  Encounter Date: last encounter 02/16/2017  PHYSICAL THERAPY DISCHARGE SUMMARY  Visits from Start of Care: 3  Current functional level related to goals / functional outcomes:     PT Long Term Goals - 02/17/16 1052      PT LONG TERM GOAL #1   Title The patient will have negative positional testing indicating resolution of BPPV.   Baseline Target date 02/19/2016   Time 4   Period Weeks     PT LONG TERM GOAL #2   Title The patient will be further assessed on gait/balance and HEP established as indicated.   Baseline Provided HEP on 02/17/2016 based on which tasks were challenging in clinic.    Time 4   Period Weeks   Status Achieved     PT LONG TERM GOAL #3   Title The patient will reduce dizziness handicap index from 32% to < or equal to 18% to demo improved self perception of dizziness.   Baseline Target date 02/19/2016   Time 4   Period Weeks    Patient met 1/3 LTGs- called to cancel remaining visit due to illness (sinus infection) and improved vertigo.   Remaining deficits: See last treatment note 02/17/16 for patient status.   Education / Equipment: HEP, nature of vertigo.   Plan: Patient agrees to discharge.  Patient goals were partially met. Patient is being discharged due to meeting the stated rehab goals.  ?????        Thank you for the referral of this patient. Rudell Cobb, MPT   [name] 04/03/2016, 2:22 PM  [facility] [address] [place], [place], [address] Phone: [phone]   Fax:  [phone]

## 2016-04-07 ENCOUNTER — Encounter: Payer: Self-pay | Admitting: *Deleted

## 2016-04-07 ENCOUNTER — Other Ambulatory Visit: Payer: Self-pay | Admitting: *Deleted

## 2016-04-07 ENCOUNTER — Encounter: Payer: Self-pay | Admitting: Podiatry

## 2016-04-07 ENCOUNTER — Ambulatory Visit (INDEPENDENT_AMBULATORY_CARE_PROVIDER_SITE_OTHER): Payer: Medicare Other | Admitting: Podiatry

## 2016-04-07 DIAGNOSIS — B351 Tinea unguium: Secondary | ICD-10-CM

## 2016-04-07 DIAGNOSIS — M79676 Pain in unspecified toe(s): Secondary | ICD-10-CM

## 2016-04-08 NOTE — Progress Notes (Signed)
He presents today with chief complaint of painfully ingrown toenails and elongated toenails bilaterally.  Objective: Vital signs are stable he is alert and oriented 3. Toenails are long thick yellow dystrophic with mycotic no open wounds or lesions are noted.  Assessment: Pain in limb secondary to onychomycosis  Plan: Debrided nails 1 through 5 bilateral..

## 2016-12-14 ENCOUNTER — Other Ambulatory Visit: Payer: Self-pay | Admitting: Family Medicine

## 2016-12-14 DIAGNOSIS — M79605 Pain in left leg: Secondary | ICD-10-CM

## 2016-12-15 ENCOUNTER — Ambulatory Visit
Admission: RE | Admit: 2016-12-15 | Discharge: 2016-12-15 | Disposition: A | Discharge status: Home / Self Care | Payer: Medicare Other | Source: Ambulatory Visit | Attending: Family Medicine | Admitting: Family Medicine

## 2016-12-15 DIAGNOSIS — M79605 Pain in left leg: Secondary | ICD-10-CM

## 2017-05-18 ENCOUNTER — Encounter: Payer: Self-pay | Admitting: Podiatry

## 2017-05-18 ENCOUNTER — Ambulatory Visit: Payer: Medicare Other | Admitting: Podiatry

## 2017-05-18 DIAGNOSIS — B351 Tinea unguium: Secondary | ICD-10-CM

## 2017-05-18 DIAGNOSIS — M79676 Pain in unspecified toe(s): Secondary | ICD-10-CM | POA: Diagnosis not present

## 2017-05-18 DIAGNOSIS — L600 Ingrowing nail: Secondary | ICD-10-CM | POA: Diagnosis not present

## 2017-05-18 NOTE — Progress Notes (Signed)
He presents today chief complaint of painful elongated toenails bilaterally.  He states that he would like to have the hallux nail left removed as possible.  Objective: Vital signs are stable he is alert and oriented x3 there is tenderness along thick yellow dystrophic clinically mycotic and painful palpation as well as debridement.  His hallux nail left is thickened painful however he is on entire verrucoid bilateral drug which decreases his immune status and I feel that this was a result of the chronic wound during the night and his failure.  Assessment: Pain in limb secondary to onychomycosis.  Plan: Debridement of toenails 1 through 5 bilateral.

## 2017-06-01 ENCOUNTER — Encounter: Payer: Self-pay | Admitting: Podiatry

## 2017-06-01 ENCOUNTER — Ambulatory Visit: Payer: Medicare Other | Admitting: Podiatry

## 2017-06-01 DIAGNOSIS — M79676 Pain in unspecified toe(s): Secondary | ICD-10-CM | POA: Diagnosis not present

## 2017-06-01 DIAGNOSIS — B351 Tinea unguium: Secondary | ICD-10-CM

## 2017-06-01 NOTE — Progress Notes (Signed)
He presents today for follow-up of his ingrown nails.  Objective: Vital signs are stable he is alert and oriented x3.  Sharp incurvated nail margins tender on palpation.  Assessment: Pain in limb secondary to ingrown nail tibial border hallux bilateral.  Plan: Debrided those nails today.  Follow-up with him as needed.

## 2017-11-18 IMAGING — CT CT HEAD W/O CM
3 of 4 series · 16 of 47 positions shown, 19 images · non-contrast
Comparison: None.

CLINICAL DATA: 75-year-old male with vertigo and dizziness for
several months. No known injury. Presyncope. Initial encounter.

EXAM:
CT HEAD WITHOUT CONTRAST
TECHNIQUE: Contiguous axial images were obtained from the base of the skull
through the vertex without intravenous contrast.

[Series 32: 3d filtered head w/o · axial · non-contrast · 0.49mm/px · z∈[-15,+115]mm · 10 of 32 slices shown, 13 images]
[im 3/32  brain]
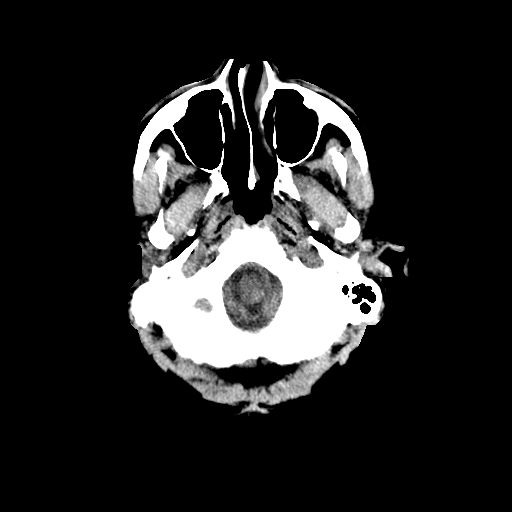
[im 3/32  bone]
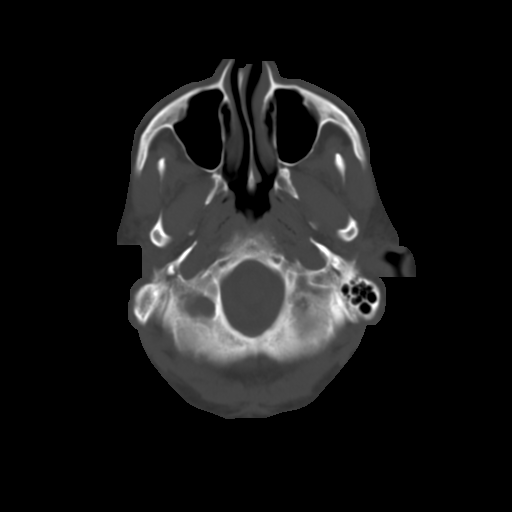
[im 5/32  brain]
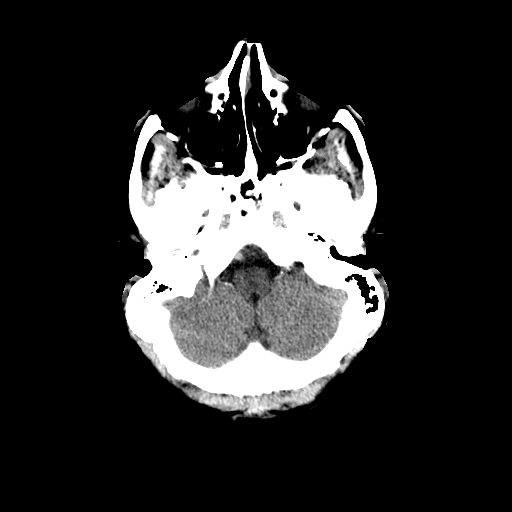
[im 9/32  brain]
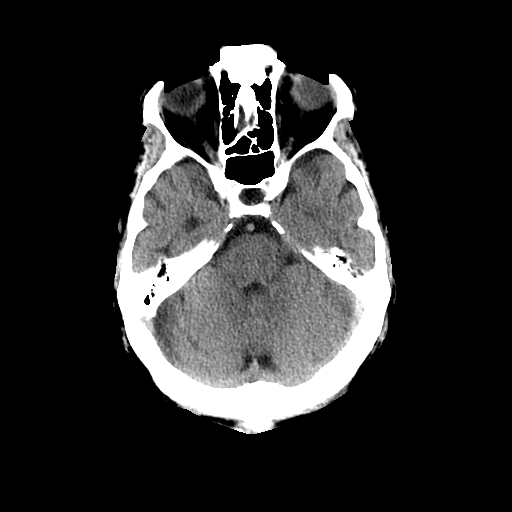
[im 12/32  brain]
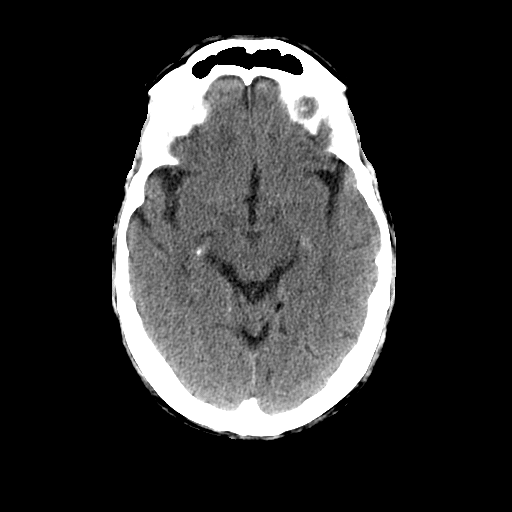
[im 14/32  brain]
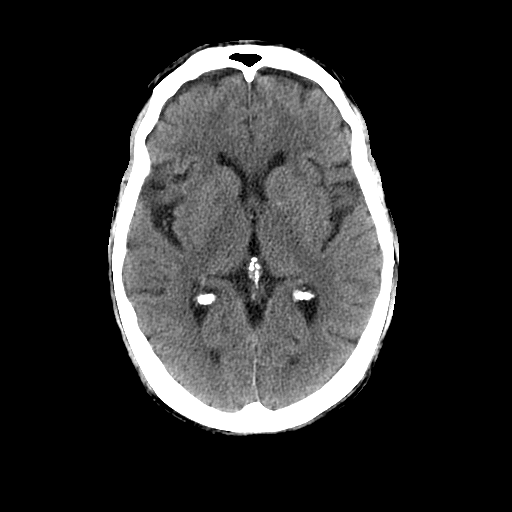
[im 14/32  bone]
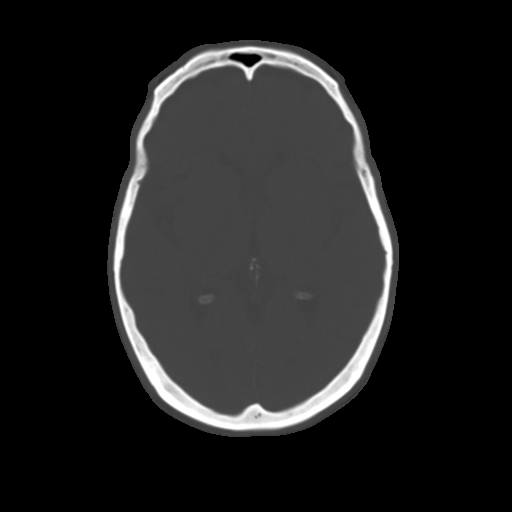
[im 18/32  brain]
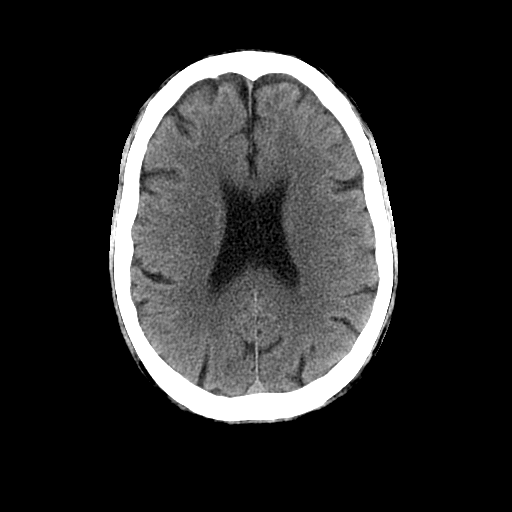
[im 20/32  brain]
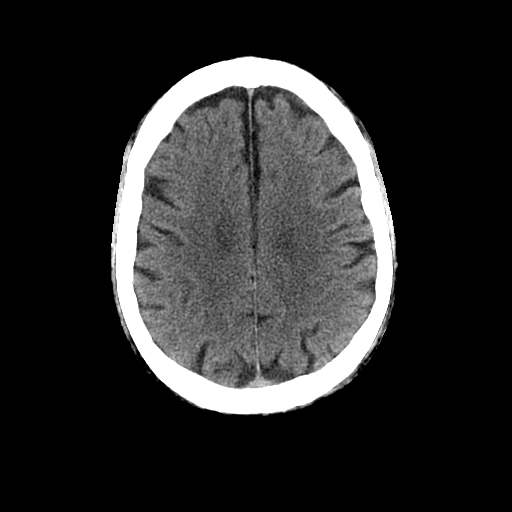
[im 23/32  brain]
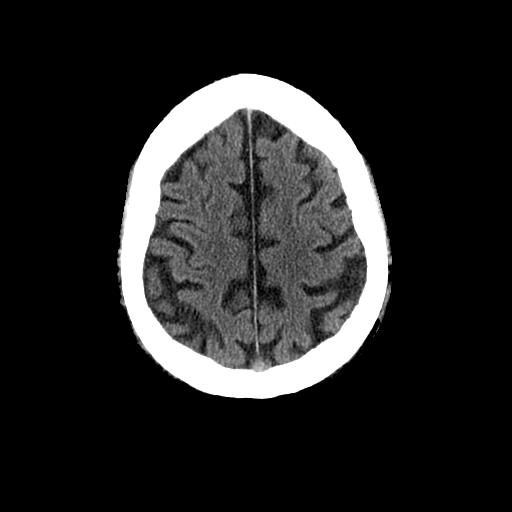
[im 27/32  brain]
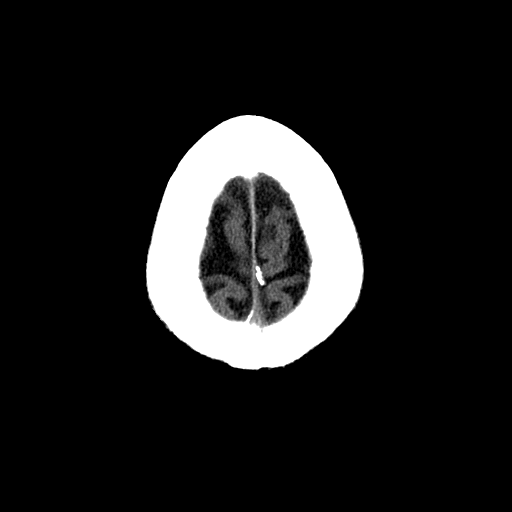
[im 27/32  bone]
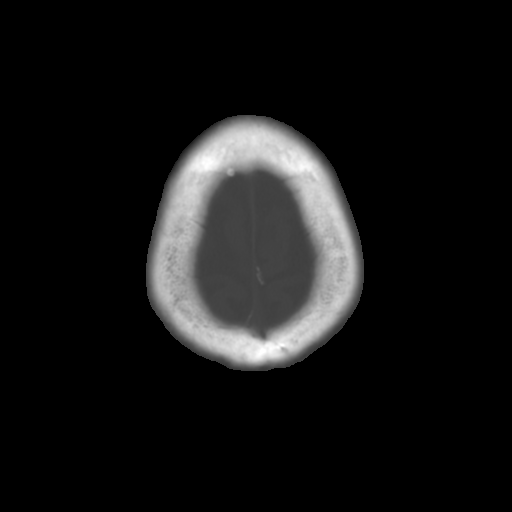
[im 29/32  brain]
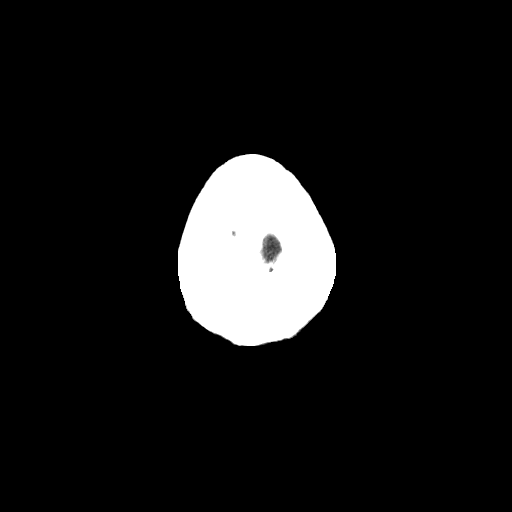

[Series 601: coronal brain · coronal · 0.49mm/px · 3 of 73 slices shown]
[im 25/73  brain]
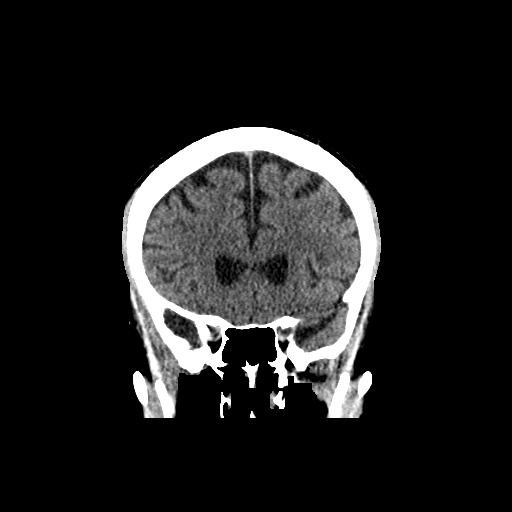
[im 33/73  brain]
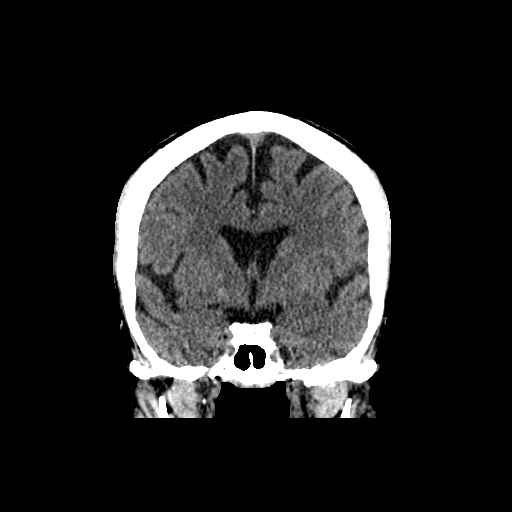
[im 41/73  brain]
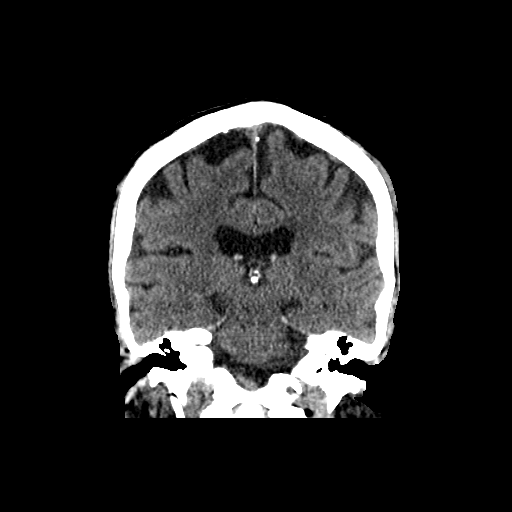

[Series 602: sagittal brain · sagittal · 0.49mm/px · 3 of 60 slices shown]
[im 20/60  brain]
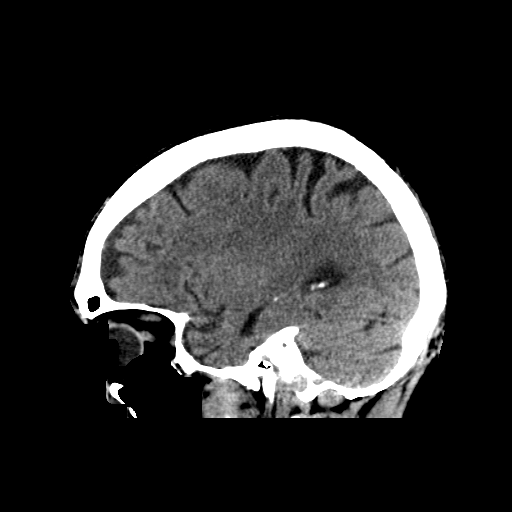
[im 30/60  brain]
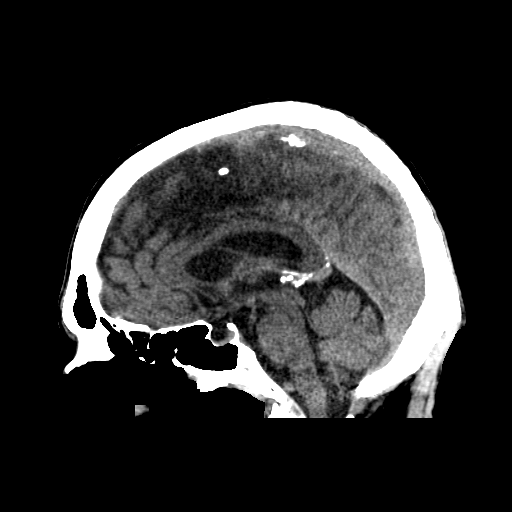
[im 40/60  brain]
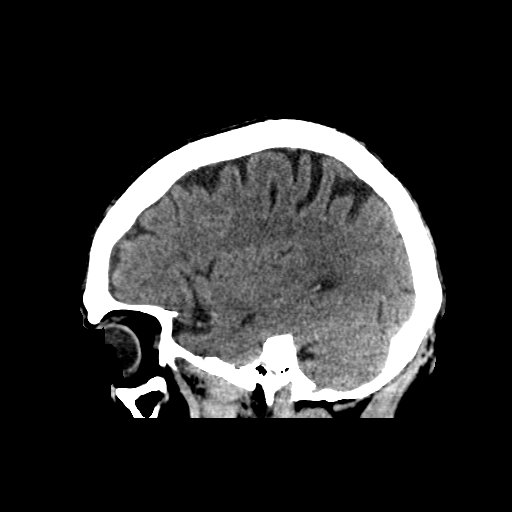

[16 of 47 positions shown; findings below may reference images not displayed]

FINDINGS: Visible paranasal sinuses and mastoids are clear. No acute orbit or
scalp soft tissue findings. Visualized skull appears intact and
within normal limits. Mild Calcified atherosclerosis at the skull
base.

Cerebral volume is within normal limits for age. No midline shift,
ventriculomegaly, mass effect, evidence of mass lesion, intracranial
hemorrhage or evidence of cortically based acute infarction.
Gray-white matter differentiation is within normal limits throughout
the brain. No suspicious intracranial vascular hyperdensity.
IMPRESSION: Normal for age non contrast CT appearance of the brain.

## 2018-01-19 ENCOUNTER — Encounter: Payer: Self-pay | Admitting: Neurology

## 2018-01-21 NOTE — Progress Notes (Signed)
NEUROLOGY CONSULTATION NOTE  Nicholas Webb MRN: [id number] DOB: [birth year]  Primary care provider: Maurice Small, MD  Reason for consult: Unsteady gait, memory problems  HISTORY OF PRESENT ILLNESS: Nicholas Webb is a 77 year old right-handed male with rheumatoid arthritis, obstructive sleep apnea, hyperlipidemia, and carotid artery disease who presents for unsteady gait and problems with memory.  He is accompanied by his daughter who supplements history.   Since 2017, he has had vertigo, described as a spinning sensation with change in position, lasting a couple of minutes.  There was no associated double vision or nausea.  CT of head from 08/12/15 was personally reviewed and was normal.  Carotid ultrasound from 08/26/2015 revealed minor carotid atherosclerosis bilaterally but without hemodynamically significant stenosis.  Patent antegrade flow of the vertebral arteries were noted.  He reportedly had an abnormal EKG with right bundle branch block.  Echocardiogram from 11/05/2015 revealed ejection fraction of 55-60% with grade 1 diastolic dysfunction and mildly dilated atrium but otherwise unremarkable.  He went to vestibular rehab which resolved the problem.  6 months ago, it started to recur.  It is not as severe as the first episode.  He can get up out of the bed and feel lightheaded.  When he has the dizziness, he may sometimes be unsteady.  He has had a couple of falls over the past year, which he attributes to tripping.  He also endorses a moderate bi-frontal pressure-like headache.  No associated symptoms.  They are triggered when bending over (body or neck), lasting for just as long as he is bent over.  It occurs every time he bends over.  He also has had some memory deficits.  For example, he forgot to pay insurance bill.  He may forget to do things that his wife asks him to perform.  B12 and TSH reportedly normal.  There is no family history of dementia.  PAST MEDICAL HISTORY: Past  Medical History:  Diagnosis Date  . Anemia of chronic disease   . Atherosclerosis of both carotid arteries   . Barrett's esophagus   . Dizziness   . GERD (gastroesophageal reflux disease)   . Hx of adenomatous colonic polyps   . Hyperlipidemia   . Macrocytosis without anemia   . Obesity   . OSA (obstructive sleep apnea)   . Rheumatoid arthritis(714.0)   . Syncope     PAST SURGICAL HISTORY: Past Surgical History:  Procedure Laterality Date  . JOINT REPLACEMENT  2004   left    MEDICATIONS: Current Outpatient Medications on File Prior to Visit  Medication Sig Dispense Refill  . Adalimumab (HUMIRA) 40 MG/0.8ML PSKT TAKE SUBCUTANEOUS EVERY TWO WEEKS    . esomeprazole (NEXIUM) 20 MG capsule Take 40 mg by mouth daily at 12 noon.    . folic acid (FOLVITE) 1 MG tablet Take 1 mg by mouth daily.    . methotrexate (RHEUMATREX) 2.5 MG tablet Caution:Chemotherapy. Protect from light. Mondays  Patient takes 5 tablets by mouth on Sundays  And 4 tablets by mouth on Mondays.    . Multiple Vitamin (MULTIVITAMIN WITH MINERALS) TABS Take 1 tablet by mouth daily.    . Vitamins/Minerals TABS Take by mouth.     No current facility-administered medications on file prior to visit.     ALLERGIES: No Known Allergies  FAMILY HISTORY: Family History  Problem Relation Age of Onset  . Stroke Mother   . Cancer Father     SOCIAL HISTORY: Social History   Socioeconomic History  .  Marital status: Married    Spouse name: Not on file  . Number of children: Not on file  . Years of education: Not on file  . Highest education level: Not on file  Occupational History  . Not on file  Social Needs  . Financial resource strain: Not on file  . Food insecurity:    Worry: Not on file    Inability: Not on file  . Transportation needs:    Medical: Not on file    Non-medical: Not on file  Tobacco Use  . Smoking status: Never Smoker  . Smokeless tobacco: Never Used  Substance and Sexual Activity    . Alcohol use: Yes  . Drug use: No  . Sexual activity: Not on file  Lifestyle  . Physical activity:    Days per week: Not on file    Minutes per session: Not on file  . Stress: Not on file  Relationships  . Social connections:    Talks on phone: Not on file    Gets together: Not on file    Attends religious service: Not on file    Active member of club or organization: Not on file    Attends meetings of clubs or organizations: Not on file    Relationship status: Not on file  . Intimate partner violence:    Fear of current or ex partner: Not on file    Emotionally abused: Not on file    Physically abused: Not on file    Forced sexual activity: Not on file  Other Topics Concern  . Not on file  Social History Narrative  . Not on file    REVIEW OF SYSTEMS: Constitutional: No fevers, chills, or sweats, no generalized fatigue, change in appetite Eyes: No visual changes, double vision, eye pain Ear, nose and throat: No hearing loss, ear pain, nasal congestion, sore throat Cardiovascular: No chest pain, palpitations Respiratory:  No shortness of breath at rest or with exertion, wheezes GastrointestinaI: No nausea, vomiting, diarrhea, abdominal pain, fecal incontinence Genitourinary:  No dysuria, urinary retention or frequency Musculoskeletal: Neck pain Integumentary: No rash, pruritus, skin lesions Neurological: as above Psychiatric: No depression, insomnia, anxiety Endocrine: No palpitations, fatigue, diaphoresis, mood swings, change in appetite, change in weight, increased thirst Hematologic/Lymphatic:  No purpura, petechiae. Allergic/Immunologic: no itchy/runny eyes, nasal congestion, recent allergic reactions, rashes  PHYSICAL EXAM: Blood pressure 104/68, pulse 76, height 5' 6.5" (1.689 m), weight 192 lb (87.1 kg), SpO2 92 %. General: No acute distress.  Patient appears well-groomed.  Head:  Normocephalic/atraumatic, left suboccipital tenderness to palpation Eyes:  fundi  examined but not visualized Neck: supple, no paraspinal tenderness, full range of motion Back: No paraspinal tenderness Heart: regular rate and rhythm Lungs: Clear to auscultation bilaterally. Vascular: No carotid bruits. Neurological Exam: Mental status: alert and oriented to person, place, and time, recent memory reduced, remote memory intact, fund of knowledge intact, attention and concentration intact, speech fluent and not dysarthric, language intact. Montreal Cognitive Assessment  01/24/2018  Visuospatial/ Executive (0/5) 5  Naming (0/3) 3  Attention: Read list of digits (0/2) 1  Attention: Read list of letters (0/1) 1  Attention: Serial 7 subtraction starting at 100 (0/3) 3  Language: Repeat phrase (0/2) 0  Language : Fluency (0/1) 0  Abstraction (0/2) 2  Delayed Recall (0/5) 2  Orientation (0/6) 6  Total 23  Adjusted Score (based on education) 23   Cranial nerves: CN I: not tested CN II: pupils equal, round and reactive to  light, visual fields intact CN III, IV, VI:  full range of motion, no nystagmus, no ptosis CN V: facial sensation intact CN VII: upper and lower face symmetric CN VIII: hearing intact CN IX, X: gag intact, uvula midline CN XI: sternocleidomastoid and trapezius muscles intact CN XII: tongue midline Bulk & Tone: normal, no fasciculations. Motor:  5/5 throughout  Sensation:  Pinprick and vibration sensation reduced in toes up to ankles, more pronounced on the left. Deep Tendon Reflexes: 1+ upper extremities, absent lower extremities, toes downgoing. Finger to nose testing:  Without dysmetria.   Heel to shin:  Without dysmetria.   Gait: Mildly wide-based gait with slight right limp and torso slightly side bent to the right.  Normal arm swing.  Able to turn.  Romberg with mild sway.  IMPRESSION: 1.  Unsteady gait, likely multifactorial.  He exhibits neuropathy in the feet which may be related to lumbar stenosis (slight asymmetry).  He also has arthritic  changes related to RA, which is likely contributing to gait abnormality. 2.  Benign paroxysmal positional vertigo 3.  Tension type headache, likely cervicogenic given that he has likely arthritis in the cervical spine and the pain is triggered with neck flexion. 4.  Memory deficits.  Based on history, he has not had any significant recurrent memory deficits.  He did miss paying 1 insurance bill but otherwise nothing suspicious.  Moca score is below normal but not significant.  Mild cognitive impairment may be possible but could be just part of normal aging.  PLAN: 1.  To address headache and neck pain, we will start gabapentin 100 mg at bedtime.  He will increase to 200 mg at bedtime in 1 week.  If needed, we can further increase dose. 2.  We will continue to monitor memory. 3.  I will refer him for vestibular rehab as it was previously effective. 4.  Follow-up in 3 to 4 months.  Thank you for allowing me to take part in the care of this patient.  40 minutes spent with the patient, over 50% spent discussing diagnosis and management.  Metta Clines, DO  CC: Maurice Small, MD

## 2018-01-24 ENCOUNTER — Ambulatory Visit (INDEPENDENT_AMBULATORY_CARE_PROVIDER_SITE_OTHER): Payer: Medicare Other | Admitting: Neurology

## 2018-01-24 ENCOUNTER — Encounter: Payer: Self-pay | Admitting: Neurology

## 2018-01-24 VITALS — BP 104/68 | HR 76 | Ht 66.5 in | Wt 192.0 lb

## 2018-01-24 DIAGNOSIS — R2681 Unsteadiness on feet: Secondary | ICD-10-CM

## 2018-01-24 DIAGNOSIS — M48061 Spinal stenosis, lumbar region without neurogenic claudication: Secondary | ICD-10-CM

## 2018-01-24 DIAGNOSIS — R413 Other amnesia: Secondary | ICD-10-CM

## 2018-01-24 DIAGNOSIS — H8110 Benign paroxysmal vertigo, unspecified ear: Secondary | ICD-10-CM | POA: Diagnosis not present

## 2018-01-24 DIAGNOSIS — G44219 Episodic tension-type headache, not intractable: Secondary | ICD-10-CM | POA: Diagnosis not present

## 2018-01-24 MED ORDER — GABAPENTIN 100 MG PO CAPS: ORAL_CAPSULE | ORAL | 0 refills | Status: DC

## 2018-01-24 NOTE — Patient Instructions (Addendum)
1.  To help with headaches and neck pain, we will start gabapentin 100mg .  Take 1 capsule at bedtime for 7 days, then increase to 2 capsules at bedtime.  If you feel we need to increase dose, contact me.  Caution for dizziness. 2.  We will refer you for vestibular rehabilitation for vertigo 3.  At this time, I don't appreciate any cognitive impairment.  We will continue to monitor.

## 2018-01-31 ENCOUNTER — Ambulatory Visit: Payer: Medicare Other | Attending: Family Medicine | Admitting: Rehabilitative and Restorative Service Providers"

## 2018-01-31 ENCOUNTER — Other Ambulatory Visit: Payer: Self-pay

## 2018-01-31 DIAGNOSIS — R2681 Unsteadiness on feet: Secondary | ICD-10-CM | POA: Diagnosis present

## 2018-01-31 DIAGNOSIS — R2689 Other abnormalities of gait and mobility: Secondary | ICD-10-CM | POA: Diagnosis present

## 2018-01-31 DIAGNOSIS — H8111 Benign paroxysmal vertigo, right ear: Secondary | ICD-10-CM | POA: Diagnosis present

## 2018-01-31 NOTE — Patient Instructions (Signed)
Access Code: [email or web address]  URL: [email or web address]  Date: 01/31/2018  Prepared by: Rudell Cobb   Exercises Brandt-Daroff Vestibular Exercise - 5 reps - 3x daily - 7x weekly

## 2018-02-01 NOTE — Therapy (Signed)
[facility] [address] [place], [place], [address] Phone: [phone]   Fax:  [phone]  Physical Therapy Evaluation  Patient Details  Name: Nicholas Webb MRN: [id number] Date of Birth: 21-May-1940 Referring Provider (PT): Metta Clines, DO   Encounter Date: 01/31/2018  PT End of Session - 01/31/18 1351    Visit Number  1    Number of Visits  8    Date for PT Re-Evaluation  04/01/18    Authorization Type  [employer] medicare, $20 copay    PT Start Time  1315    PT Stop Time  1400    PT Time Calculation (min)  45 min    Activity Tolerance  Patient tolerated treatment well    Behavior During Therapy  [facility] for tasks assessed/performed       Past Medical History:  Diagnosis Date  . Anemia of chronic disease   . Atherosclerosis of both carotid arteries   . Barrett's esophagus   . Dizziness   . GERD (gastroesophageal reflux disease)   . Hx of adenomatous colonic polyps   . Hyperlipidemia   . Macrocytosis without anemia   . Obesity   . OSA (obstructive sleep apnea)   . Rheumatoid arthritis(714.0)   . Syncope     Past Surgical History:  Procedure Laterality Date  . JOINT REPLACEMENT  2004   left    There were no vitals filed for this visit.   Subjective Assessment - 01/31/18 1307    Subjective  The patient notes a "staggering problem" for the first 5-10 minutes of walking in the morning.  He has not noted a change in symptoms since beginning med (last week from neurology).  He denies a spinning sensation and notes more of an "off balance" sensation.  He feels stress and early mornings make it worse.  He notes some imbalance on rocky surfaces.      Pertinent History   rheumatoid arthritis, obstructive sleep apnea, hyperlipidemia, and carotid artery disease who presents for unsteady gait and problems with memory    Patient Stated Goals  Get this cleared up to "get my wife off my back". (more of the staggery feeling)     Currently in Pain?  No/denies         [facility] PT Assessment - 01/31/18 1322      Assessment   Medical Diagnosis  BPPV, unspecified laterality    Referring Provider (PT)  Metta Clines, DO    Onset Date/Surgical Date  --   6 months   Hand Dominance  Right    Prior Therapy  known to our clinic from prior vestibular rehab      Precautions   Precautions  Fall      Restrictions   Weight Bearing Restrictions  No      Balance Screen   Has the patient fallen in the past 6 months  Yes    How many times?  2- tripping (walking in yard)    Has the patient had a decrease in activity level because of a fear of falling?   No    Is the patient reluctant to leave their home because of a fear of falling?   No      Home Environment   Living Environment  Private residence    Living Arrangements  Spouse/significant other    Type of [place] to enter    Entrance Stairs-Number of Steps  1    Home Layout  One level    Home Equipment  None      Prior Function   Level of Independence  Independent      Sensation   Additional Comments  Noted abnormalities in neurology examination; noticed visual changes (having to move items closer).       Posture/Postural Control   Posture/Postural Control  Postural limitations    Postural Limitations  Rounded Shoulders;Forward head;Decreased lumbar lordosis;Increased thoracic kyphosis      ROM / Strength   AROM / PROM / Strength  AROM;Strength      AROM   Overall AROM   Within functional limits for tasks performed      Strength   Overall Strength  Within functional limits for tasks performed    Overall Strength Comments  5/5 bilateral UEs for shoulder flexion/abduction, elbow flexion/extension.  5/5 bilateral hip flexion, knee flexion/extension, ankle DF.      Transfers   Transfers  Sit to Stand;Stand to Sit    Sit to Stand  7: Independent    Five time sit to stand comments   13.09 seconds    Stand to Sit  7: Independent       Ambulation/Gait   Ambulation/Gait  Yes    Ambulation/Gait Assistance  7: Independent      Standardized Balance Assessment   Standardized Balance Assessment  Berg Balance Test      Berg Balance Test   Sit to Stand  Able to stand without using hands and stabilize independently    Standing Unsupported  Able to stand safely 2 minutes    Sitting with Back Unsupported but Feet Supported on Floor or Stool  Able to sit safely and securely 2 minutes    Stand to Sit  Sits safely with minimal use of hands    Transfers  Able to transfer safely, minor use of hands    Standing Unsupported with Eyes Closed  Able to stand 10 seconds safely    Standing Ubsupported with Feet Together  Able to place feet together independently and stand 1 minute safely    From Standing, Reach Forward with Outstretched Arm  Can reach confidently >25 cm (10")    From Standing Position, Pick up Object from Floor  Able to pick up shoe safely and easily    From Standing Position, Turn to Look Behind Over each Shoulder  Turn sideways only but maintains balance    Turn 360 Degrees  Able to turn 360 degrees safely but slowly   slight dizziness   Standing Unsupported, Alternately Place Feet on Step/Stool  Able to stand independently and safely and complete 8 steps in 20 seconds    Standing Unsupported, One Foot in Front  Able to plae foot ahead of the other independently and hold 30 seconds    Standing on One Leg  Tries to lift leg/unable to hold 3 seconds but remains standing independently    Total Score  48    Berg comment:  48/56            Vestibular Assessment - 01/31/18 1335      Vestibular Assessment   General Observation  Walks independently into clinic.  Rates resting dizziness as a 1/10 further described as "lightheaded".      Symptom Behavior   Type of Dizziness  Lightheadedness    Frequency of Dizziness  daily    Duration of Dizziness  constant    Aggravating Factors  --   morning,  stress   Relieving  Factors  Head stationary   rest     Occulomotor Exam   Occulomotor Alignment  Abnormal   R eye hypertropia   Spontaneous  Absent    Gaze-induced  Absent    Smooth Pursuits  Intact   needs cues to avoid head motion   Saccades  Intact   takes 2 eye movements to get to target.     Vestibulo-Occular Reflex   VOR 1 Head Only (x 1 viewing)  Patient guards against movement for head impulse test.  Positive to the left side when PT could test.  Did not get impulse to the right due to neck muscle guarding      Positional Testing   Sidelying Test  Sidelying Right;Sidelying Left    Horizontal Canal Testing  Horizontal Canal Right;Horizontal Canal Left      Sidelying Right   Sidelying Right Duration  6 seconds    Sidelying Right Symptoms  Upbeat, right rotatory nystagmus      Sidelying Left   Sidelying Left Duration  None moving sit>L sidelying, however 3/10 with return to sit "lightheaded" sensation    Sidelying Left Symptoms  No nystagmus      Horizontal Canal Right   Horizontal Canal Right Duration  none    Horizontal Canal Right Symptoms  Normal      Horizontal Canal Left   Horizontal Canal Left Duration  none    Horizontal Canal Left Symptoms  Normal          Objective measurements completed on examination: See above findings.       Vestibular Treatment/Exercise - 01/31/18 1345      Vestibular Treatment/Exercise   Vestibular Treatment Provided  Canalith Repositioning;Habituation    Canalith Repositioning  Semont Procedure Right Posterior      Semont Procedure Right Posterior   Number of Reps   2    Response Details   *PT chose Semont maneuver due to neck tightness.  During prior vestibular rehab episode of care, PT repositioned otoconia into horiozntal canal most likely due to neck limitations.  Chose Semont over Epley's today to try to avoid this same outcome.  After first rep, patient reports 5/10 dizziness/ lightheadedness.  Retested with trace subjective report but  no nystagmus noted on 2nd rep.  Still notes 5/10 lightheadedness with return to sitting.             PT Education - 02/01/18 0800    Education Details  brandt daroff habituation    Person(s) Educated  Patient    Methods  Explanation;Handout;Demonstration    Comprehension  Verbalized understanding;Returned demonstration       PT Short Term Goals - 01/31/18 1353      PT SHORT TERM GOAL #1   Title  The patient will have negative positional testing indicating resolution of R BPPV    Time  4    Period  Weeks    Target Date  03/02/18      PT SHORT TERM GOAL #2   Title  The patient will return demo HEP for habituation, high level balance and multi-sensory balance challenges.    Time  4    Period  Weeks    Target Date  03/02/18      PT SHORT TERM GOAL #3   Title  Check gait speed and establish LTG.    Time  4    Period  Weeks    Target Date  03/02/18  PT SHORT TERM GOAL #4   Title  Check SOT and establish LTG.    Time  4    Period  Weeks    Target Date  03/02/18        PT Long Term Goals - 01/31/18 1354      PT LONG TERM GOAL #1   Title  The patient will verbalize understanding of gym routine (has silver sneakers).    Time  8    Target Date  04/01/18      PT LONG TERM GOAL #2   Title  Improve Berg score from 48/56 to > or equal to 52/56 to demo improving high level balance and dec'd risk for falls.    Time  8    Period  Weeks    Target Date  04/01/18      PT LONG TERM GOAL #3   Title  Gait speed goal based on STG baseline.    Time  8    Period  Weeks    Target Date  04/01/18      PT LONG TERM GOAL #4   Title  The patient will improve on SOT by > or equal to 9% to demo improving multi-sensory balance use.    Baseline  to establish baseline    Time  8    Period  Weeks    Target Date  04/01/18             Plan - 01/31/18 1357    Clinical Impression Statement  The patient is a 77 year old male presenting to OP physical therapy with R posterior  canal BPPV (per + R sidelying test), cervical/neck muscle tightness/guarding, multi-sensory balance impairment, and decreased mobility.  PT focused on tretament of BPPV today (modified due to neck tightness), and establishing a HEP for habituation.  Plan to further assess gait speed and sensory organization and establish HEP to address.  Also plan to address psotural weakness and neck ROM as tolerated.      History and Personal Factors relevant to plan of care:  rheumatoid arthritis, obstructive sleep apnea, hyperlipidemia, and carotid artery disease who presents for unsteady gait and problems with memory    Clinical Presentation  Evolving    Clinical Presentation due to:  2 falls (tripping), sensation of balance worsening in the morning, and multi-sensory nature of impairments    Clinical Decision Making  Moderate    Rehab Potential  Good    PT Frequency  1x / week    PT Duration  8 weeks    PT Treatment/Interventions  ADLs/Self Care Home Management;Balance training;Neuromuscular re-education;Gait training;Functional mobility training;Therapeutic activities;Therapeutic exercise;Manual techniques;Vestibular;Canalith Repostioning;Patient/family education;Passive range of motion;Dry needling    PT Next Visit Plan  Check orthostatic measures, assess gait speed, assess SOT, check HEP habituation and treat BPPV, neck ROM and VOR to tolerance.    Consulted and Agree with Plan of Care  Patient       Patient will benefit from skilled therapeutic intervention in order to improve the following deficits and impairments:  Abnormal gait, Decreased activity tolerance, Decreased balance, Dizziness, Impaired sensation, Decreased mobility, Postural dysfunction, Decreased range of motion  Visit Diagnosis: BPPV (benign paroxysmal positional vertigo), right  Unsteadiness on feet  Other abnormalities of gait and mobility     Problem List Patient Active Problem List   Diagnosis Date Noted  . BPPV (benign  paroxysmal positional vertigo), left 12/20/2015  . Syncope   . Dizziness     Marigny Borre 02/01/2018,  8:09 AM  [facility] [address] [place], [place], [address] Phone: [phone]   Fax:  [phone]  Name: LOGIN MUCKLEROY MRN: [id number] Date of Birth: [birth year]

## 2018-02-07 ENCOUNTER — Ambulatory Visit: Payer: Medicare Other | Admitting: Rehabilitative and Restorative Service Providers"

## 2018-02-07 ENCOUNTER — Encounter: Payer: Self-pay | Admitting: Rehabilitative and Restorative Service Providers"

## 2018-02-07 DIAGNOSIS — R2681 Unsteadiness on feet: Secondary | ICD-10-CM

## 2018-02-07 DIAGNOSIS — H8111 Benign paroxysmal vertigo, right ear: Secondary | ICD-10-CM | POA: Diagnosis not present

## 2018-02-07 DIAGNOSIS — R2689 Other abnormalities of gait and mobility: Secondary | ICD-10-CM

## 2018-02-07 NOTE — Therapy (Signed)
[facility] [address] [place], [place], [address] Phone: [phone]   Fax:  [phone]  Physical Therapy Treatment  Patient Details  Name: Nicholas Webb MRN: [id number] Date of Birth: 1941/02/28 Referring Provider (PT): Metta Clines, DO   Encounter Date: 02/07/2018  PT End of Session - 02/07/18 1109    Visit Number  2    Number of Visits  8    Date for PT Re-Evaluation  04/01/18    Authorization Type  [employer] medicare, $20 copay    PT Start Time  1105    PT Stop Time  1145    PT Time Calculation (min)  40 min    Activity Tolerance  Patient tolerated treatment well    Behavior During Therapy  [facility] for tasks assessed/performed       Past Medical History:  Diagnosis Date  . Anemia of chronic disease   . Atherosclerosis of both carotid arteries   . Barrett's esophagus   . Dizziness   . GERD (gastroesophageal reflux disease)   . Hx of adenomatous colonic polyps   . Hyperlipidemia   . Macrocytosis without anemia   . Obesity   . OSA (obstructive sleep apnea)   . Rheumatoid arthritis(714.0)   . Syncope     Past Surgical History:  Procedure Laterality Date  . JOINT REPLACEMENT  2004   left    There were no vitals filed for this visit.  Subjective Assessment - 02/07/18 1105    Subjective  The patient thinks that he had a negative reaction to gabapentin and he noticed worseing dizziness.  He stopped taking it the end of last week and notices he is less dizzy when not taking the medication.    He didn't do the exercises because of not feeling his best.    Pertinent History   rheumatoid arthritis, obstructive sleep apnea, hyperlipidemia, and carotid artery disease who presents for unsteady gait and problems with memory    Patient Stated Goals  Get this cleared up to "get my wife off my back". (more of the staggery feeling)    Currently in Pain?  No/denies         [facility] PT Assessment - 02/07/18 1131       Ambulation/Gait   Ambulation/Gait  Yes    Ambulation/Gait Assistance  7: Independent    Ambulation Distance (Feet)  300 Feet    Assistive device  None    Gait Pattern  Within Functional Limits    Ambulation Surface  Level    Gait velocity  3.17 ft/sec      Balance   Balance Assessed  Yes      Dynamic Standing Balance   Dynamic Standing - Comments  Sensory organization testing scoring 47% compared to age/height norm of 65%.  Patient WNLs use of somatosensory and visual inputs and 0% use of vestibular inputs for balance.  He has "falls" on 3/3 trials of conditions 5 and 6.      Standardized Balance Assessment   Standardized Balance Assessment  Balance Master Testing    Balance Master Testing  Sensory Organization Test         Vestibular Assessment - 02/07/18 1112      Positional Testing   Sidelying Test  Sidelying Right      Sidelying Right   Sidelying Right Duration  5 seconds    Sidelying Right Symptoms  Upbeat, right rotatory nystagmus      Orthostatics   BP supine (  x 5 minutes)  102/68    HR supine (x 5 minutes)  96    BP standing (after 1 minute)  90/62    HR standing (after 1 minute)  100    BP standing (after 3 minutes)  100/60    HR standing (after 3 minutes)  72    Orthostatics Comment  Mild sensation of lightheadedness when moving from sit>stand.               [facility] PT Treatment/Exercise - 02/07/18 1131      Neuro Re-ed    Neuro Re-ed Details   Corner balance HEP with feet apart progressing to feet together on pillow wiht eyes closed.  *Feet apart safest for HEP.       Vestibular Treatment/Exercise - 02/07/18 1113      Vestibular Treatment/Exercise   Vestibular Treatment Provided  Habituation;Canalith Repositioning    Canalith Repositioning  Epley Manuever Right    Habituation Exercises  [name]       EPLEY MANUEVER RIGHT   Number of Reps   2    Response Details   modified with pillow under upper back/ was able to tolerate 30  degrees of neck extension with pillow and PT supporting head.  No nystagmus or dizziness on 2nd rep (however symptoms seem to fatigue quickly).        [name]   Number of Reps   2    Symptom Description   Symptoms fatigue second rep to the right side.             PT Education - 02/07/18 1151    Education Details  HEP: corner standing + compliant surface + eyes closed with feet apart    Person(s) Educated  Patient    Methods  Explanation;Demonstration;Handout    Comprehension  Verbalized understanding;Returned demonstration       PT Short Term Goals - 01/31/18 1353      PT SHORT TERM GOAL #1   Title  The patient will have negative positional testing indicating resolution of R BPPV    Time  4    Period  Weeks    Target Date  03/02/18      PT SHORT TERM GOAL #2   Title  The patient will return demo HEP for habituation, high level balance and multi-sensory balance challenges.    Time  4    Period  Weeks    Target Date  03/02/18      PT SHORT TERM GOAL #3   Title  Check gait speed and establish LTG.    Time  4    Period  Weeks    Target Date  03/02/18      PT SHORT TERM GOAL #4   Title  Check SOT and establish LTG.    Time  4    Period  Weeks    Target Date  03/02/18        PT Long Term Goals - 01/31/18 1354      PT LONG TERM GOAL #1   Title  The patient will verbalize understanding of gym routine (has silver sneakers).    Time  8    Target Date  04/01/18      PT LONG TERM GOAL #2   Title  Improve Berg score from 48/56 to > or equal to 52/56 to demo improving high level balance and dec'd risk for falls.    Time  8    Period  Weeks  Target Date  04/01/18      PT LONG TERM GOAL #3   Title  Gait speed goal based on STG baseline.    Time  8    Period  Weeks    Target Date  04/01/18      PT LONG TERM GOAL #4   Title  The patient will improve on SOT by > or equal to 9% to demo improving multi-sensory balance use.    Baseline  to establish baseline     Time  8    Period  Weeks    Target Date  04/01/18            Plan - 02/07/18 1215    Clinical Impression Statement  The patient continues with mild R BPPV, treated in clinic with modified Epley's maneuver today and recommended HEP for habituation.  He shows dec'd use of vestibular inputs on SOT and PT provided HEP for foam + eyes closed in corner.  Continue working to [employer].     PT Treatment/Interventions  ADLs/Self Care Home Management;Balance training;Neuromuscular re-education;Gait training;Functional mobility training;Therapeutic activities;Therapeutic exercise;Manual techniques;Vestibular;Canalith Repostioning;Patient/family education;Passive range of motion;Dry needling    PT Next Visit Plan  Check HEP, ex to improve vestibular inputs, recheck R BPPV, neck ROM + VOR    Consulted and Agree with Plan of Care  Patient       Patient will benefit from skilled therapeutic intervention in order to improve the following deficits and impairments:  Abnormal gait, Decreased activity tolerance, Decreased balance, Dizziness, Impaired sensation, Decreased mobility, Postural dysfunction, Decreased range of motion  Visit Diagnosis: BPPV (benign paroxysmal positional vertigo), right  Unsteadiness on feet  Other abnormalities of gait and mobility     Problem List Patient Active Problem List   Diagnosis Date Noted  . BPPV (benign paroxysmal positional vertigo), left 12/20/2015  . Syncope   . Dizziness     Devita Nies, PT 02/07/2018, 12:19 PM  [facility] [address] [place], [place], [address] Phone: [phone]   Fax:  [phone]  Name: Nicholas Webb MRN: [id number] Date of Birth: 10-Jan-1941

## 2018-02-07 NOTE — Patient Instructions (Signed)
Feet Apart (Compliant Surface) Varied Arm Positions - Eyes Closed    Stand on compliant surface: __pillow__ with feet shoulder width apart and arms out. Close eyes and visualize upright position. Hold__30__ seconds. Repeat _3___ times per session. Do __2__ sessions per day.  Copyright  VHI. All rights reserved.   

## 2018-02-18 ENCOUNTER — Encounter

## 2018-02-23 ENCOUNTER — Other Ambulatory Visit: Payer: Self-pay | Admitting: Neurology

## 2018-02-24 ENCOUNTER — Telehealth: Payer: Self-pay | Admitting: Neurology

## 2018-02-24 NOTE — Telephone Encounter (Signed)
Called and LMOVM advising Pt could try muscle relaxant and to call back if agreeable.

## 2018-02-24 NOTE — Telephone Encounter (Signed)
Pt unable to tolerate gabapentin, states it makes him "loopy". He d/c'd 2 weeks ago. Did not help headaches or neck pain. Is currently in vestibular rehab. F/U appt in April, Pt wants to know if there is something else he should try until then, or wait until appt

## 2018-02-24 NOTE — Telephone Encounter (Signed)
Patient left vm about medication effects. Please call him back at [phone]. Thanks!

## 2018-02-24 NOTE — Telephone Encounter (Signed)
If he has not tried a muscle relaxant, he can take tizanidine 2mg  at bedtime as needed for neck pain/headache and see if this helps.  It will make him sleepy, so take at bedtime only.

## 2018-02-25 ENCOUNTER — Encounter

## 2018-02-28 NOTE — Telephone Encounter (Signed)
Called home number, LMOVM again for Pt to return my call

## 2018-03-01 ENCOUNTER — Ambulatory Visit: Payer: Medicare Other | Admitting: Podiatry

## 2018-03-01 ENCOUNTER — Encounter: Payer: Self-pay | Admitting: Podiatry

## 2018-03-01 DIAGNOSIS — M79676 Pain in unspecified toe(s): Secondary | ICD-10-CM | POA: Diagnosis not present

## 2018-03-01 DIAGNOSIS — B351 Tinea unguium: Secondary | ICD-10-CM

## 2018-03-01 NOTE — Progress Notes (Signed)
Presents today chief complaint of painful elongated toenails 1 through 5 bilateral.  Objective: Toenails are long thick yellow dystrophic-like mycotic painful palpation.  Assessment: Pain in limb secondary onychomycosis.  Plan: Debridement of toenails 1 through 5 bilateral.

## 2018-03-03 ENCOUNTER — Telehealth: Payer: Self-pay | Admitting: Neurology

## 2018-03-03 NOTE — Telephone Encounter (Signed)
Patient is calling in you back. Please call

## 2018-03-04 ENCOUNTER — Ambulatory Visit: Payer: Medicare Other | Attending: Family Medicine | Admitting: Rehabilitative and Restorative Service Providers"

## 2018-03-04 ENCOUNTER — Encounter: Payer: Self-pay | Admitting: Rehabilitative and Restorative Service Providers"

## 2018-03-04 DIAGNOSIS — H8111 Benign paroxysmal vertigo, right ear: Secondary | ICD-10-CM | POA: Insufficient documentation

## 2018-03-04 DIAGNOSIS — R2681 Unsteadiness on feet: Secondary | ICD-10-CM | POA: Insufficient documentation

## 2018-03-04 DIAGNOSIS — R2689 Other abnormalities of gait and mobility: Secondary | ICD-10-CM | POA: Diagnosis present

## 2018-03-04 MED ORDER — TIZANIDINE HCL 2 MG PO CAPS: 2.0000 mg | ORAL_CAPSULE | Freq: Every day | ORAL | 3 refills | Status: DC

## 2018-03-04 NOTE — Therapy (Signed)
[facility] [address] [place], [place], [address] Phone: [phone]   Fax:  [phone]  Physical Therapy Treatment  Patient Details  Name: Nicholas Webb MRN: [id number] Date of Birth: 1940/04/13 Referring Provider (PT): Metta Clines, DO   Encounter Date: 03/04/2018  PT End of Session - 03/04/18 1452    Visit Number  3    Number of Visits  8    Date for PT Re-Evaluation  04/01/18    Authorization Type  [employer] medicare, $20 copay    PT Start Time  1448    PT Stop Time  1532    PT Time Calculation (min)  44 min    Activity Tolerance  Patient tolerated treatment well    Behavior During Therapy  [facility] for tasks assessed/performed       Past Medical History:  Diagnosis Date  . Anemia of chronic disease   . Atherosclerosis of both carotid arteries   . Barrett's esophagus   . Dizziness   . GERD (gastroesophageal reflux disease)   . Hx of adenomatous colonic polyps   . Hyperlipidemia   . Macrocytosis without anemia   . Obesity   . OSA (obstructive sleep apnea)   . Rheumatoid arthritis(714.0)   . Syncope     Past Surgical History:  Procedure Laterality Date  . JOINT REPLACEMENT  2004   left    There were no vitals filed for this visit.  Subjective Assessment - 03/04/18 1447    Subjective  The patient continues with issues with vertigo.  "I think they are going to give me a different medication for pain for the neck."  He notes dizziness during the day worse when bending to look at items in the sink.  The patient lost the excercise sheet and didn't do the exercises since last visit.    Pertinent History   rheumatoid arthritis, obstructive sleep apnea, hyperlipidemia, and carotid artery disease who presents for unsteady gait and problems with memory    Patient Stated Goals  Get this cleared up to "get my wife off my back". (more of the staggery feeling)    Currently in Pain?  No/denies              Vestibular Assessment - 03/04/18 1453      Vestibular Assessment   General Observation  Patient still notes some vertigo with brandt daroff HEP.        Positional Testing   Sidelying Test  Sidelying Right;Sidelying Left      Sidelying Right   Sidelying Right Duration  8 seconds    Sidelying Right Symptoms  Upbeat, right rotatory nystagmus      Sidelying Left   Sidelying Left Duration  none    Sidelying Left Symptoms  No nystagmus               [facility] Adult PT Treatment/Exercise - 03/04/18 1601      Neuro Re-ed    Neuro Re-ed Details   turning in place for habituation and balance, dynamic gait activities with  horizontal head motion with supervision with veering from midline noted, reviewed foam standing with eyes closed for HEP,.      Exercises   Exercises  Neck      Neck Exercises: Seated   Cervical Rotation  5 reps;Right;Left    Other Seated Exercise  seated postural cues for improved alignment and chin tuck in seated position      Neck Exercises: Supine  Neck Retraction  10 reps;5 secs    Cervical Rotation  5 reps    Cervical Rotation Limitations  initially had pain at upper c-spine right side and this improved with manual therapy      Manual Therapy   Manual Therapy  Joint mobilization;Soft tissue mobilization    Manual therapy comments  to gain incrased ROM c-spine and reduce pain R upper c-spine at end range rotation    Joint Mobilization  upglides and down glides upper to mid cervical spine, lateral glides grader i-II upper cervical spine,     Soft tissue mobilization  suboccipital release, posterior c-spinemusculature, scalene soft tissue mobilization      Vestibular Treatment/Exercise - 03/04/18 1457      Vestibular Treatment/Exercise   Vestibular Treatment Provided  Habituation;Gaze    Gaze Exercises  X1 Viewing Horizontal      Brandt Daroff   Number of Reps   3    Symptom Description   symptoms fatigue to the right after the first  repetition      X1 Viewing Horizontal   Foot Position  seated    Comments  Verbal cues for posture and speed of movement.      X1 Viewing Vertical   Comments  deferred due to use of bifocal lenses              PT Short Term Goals - 03/04/18 1604      PT SHORT TERM GOAL #1   Title  The patient will have negative positional testing indicating resolution of R BPPV    Baseline  improved, but not resolved per + R sidelying test on first rep    Time  4    Period  Weeks    Status  Partially Met      PT SHORT TERM GOAL #2   Title  The patient will return demo HEP for habituation, high level balance and multi-sensory balance challenges.    Time  4    Period  Weeks    Status  Achieved      PT SHORT TERM GOAL #3   Title  Check gait speed and establish LTG.    Time  4    Period  Weeks    Status  On-going      PT SHORT TERM GOAL #4   Title  Check SOT and establish LTG.    Baseline  scored 47% composite equilibrium score    Time  4    Period  Weeks    Status  Achieved        PT Long Term Goals - 01/31/18 1354      PT LONG TERM GOAL #1   Title  The patient will verbalize understanding of gym routine (has silver sneakers).    Time  8    Target Date  04/01/18      PT LONG TERM GOAL #2   Title  Improve Berg score from 48/56 to > or equal to 52/56 to demo improving high level balance and dec'd risk for falls.    Time  8    Period  Weeks    Target Date  04/01/18      PT LONG TERM GOAL #3   Title  Gait speed goal based on STG baseline.    Time  8    Period  Weeks    Target Date  04/01/18      PT LONG TERM GOAL #4   Title  The patient will   improve on SOT by > or equal to 9% to demo improving multi-sensory balance use.    Baseline  to establish baseline    Time  8    Period  Weeks    Target Date  04/01/18            Plan - 03/04/18 1606    Clinical Impression Statement  Patient met 2 STGs and partially met 1 STG.  PT to assess gait speed at next session.   Patient has some veering with head motion during dynamic tasks.  PT focused on improving posture and neck mobility to enhance ability to perform VOR x 1 viewing.  Continue with activities to stimulate use of vestibular inputs.     PT Treatment/Interventions  ADLs/Self Care Home Management;Balance training;Neuromuscular re-education;Gait training;Functional mobility training;Therapeutic activities;Therapeutic exercise;Manual techniques;Vestibular;Canalith Repostioning;Patient/family education;Passive range of motion;Dry needling    PT Next Visit Plan  check STGs (gait speed), dynamic gait with head motion, check prior HEP for accuracy of technique and progress, habituation for turns.     Consulted and Agree with Plan of Care  Patient       Patient will benefit from skilled therapeutic intervention in order to improve the following deficits and impairments:  Abnormal gait, Decreased activity tolerance, Decreased balance, Dizziness, Impaired sensation, Decreased mobility, Postural dysfunction, Decreased range of motion  Visit Diagnosis: BPPV (benign paroxysmal positional vertigo), right  Unsteadiness on feet  Other abnormalities of gait and mobility     Problem List Patient Active Problem List   Diagnosis Date Noted  . BPPV (benign paroxysmal positional vertigo), left 12/20/2015  . Syncope   . Dizziness     WEAVER,CHRISTINA, PT 03/04/2018, 4:07 PM  [facility] [address] [place], [place], [address] Phone: [phone]   Fax:  [phone]  Name: Nicholas Webb MRN: [id number] Date of Birth: 06/15/1940   

## 2018-03-04 NOTE — Addendum Note (Signed)
Addended by: Clois Comber on: 03/04/2018 09:56 AM   Modules accepted: Orders

## 2018-03-04 NOTE — Patient Instructions (Signed)
Access Code: [email or web address]  URL: [email or web address]  Date: 03/04/2018  Prepared by: Rudell Cobb   Exercises Brandt-Daroff Vestibular Exercise - 5 reps - 3x daily - 7x weekly Wide Stance with Eyes Closed on Foam Pad - 3 reps - 1 sets - 30 seconds hold - 1x daily - 7x weekly Standing with Head Rotation - 10 reps - 3 sets - 1x daily - 7x weekly Standing Quarter Turn - 5 reps - 1 sets - 1x daily - 7x weekly Seated Gaze Stabilization with Head Rotation - 1 reps - 2 sets - 3x daily - 7x weekly

## 2018-03-04 NOTE — Telephone Encounter (Signed)
Spoke with Pt, he is agreeable to try zanaflex

## 2018-03-14 ENCOUNTER — Ambulatory Visit: Payer: Medicare Other | Admitting: Rehabilitative and Restorative Service Providers"

## 2018-03-14 DIAGNOSIS — H8111 Benign paroxysmal vertigo, right ear: Secondary | ICD-10-CM | POA: Diagnosis not present

## 2018-03-14 DIAGNOSIS — R2689 Other abnormalities of gait and mobility: Secondary | ICD-10-CM

## 2018-03-14 DIAGNOSIS — R2681 Unsteadiness on feet: Secondary | ICD-10-CM

## 2018-03-14 NOTE — Therapy (Signed)
[facility] [address] [place], [place], [address] Phone: [phone]   Fax:  [phone]  Physical Therapy Treatment  Patient Details  Name: Nicholas Webb MRN: [id number] Date of Birth: 08-Oct-1940 Referring Provider (PT): Metta Clines, DO   Encounter Date: 03/14/2018  PT End of Session - 03/14/18 1107    Visit Number  4    Number of Visits  8    Date for PT Re-Evaluation  04/01/18    Authorization Type  [employer] medicare, $20 copay    PT Start Time  1104    PT Stop Time  1144    PT Time Calculation (min)  40 min    Activity Tolerance  Patient tolerated treatment well    Behavior During Therapy  [facility] for tasks assessed/performed       Past Medical History:  Diagnosis Date  . Anemia of chronic disease   . Atherosclerosis of both carotid arteries   . Barrett's esophagus   . Dizziness   . GERD (gastroesophageal reflux disease)   . Hx of adenomatous colonic polyps   . Hyperlipidemia   . Macrocytosis without anemia   . Obesity   . OSA (obstructive sleep apnea)   . Rheumatoid arthritis(714.0)   . Syncope     Past Surgical History:  Procedure Laterality Date  . JOINT REPLACEMENT  2004   left    There were no vitals filed for this visit.  Subjective Assessment - 03/14/18 1105    Subjective  "The exercises have helped a little bit."  The patient gets dizziness to the right side "not every time, but most" on the first rep.  He notes some soreness in his neck and feels the CPAP pillow might be aggravating his neck further.      Pertinent History   rheumatoid arthritis, obstructive sleep apnea, hyperlipidemia, and carotid artery disease who presents for unsteady gait and problems with memory    Patient Stated Goals  Get this cleared up to "get my wife off my back". (more of the staggery feeling)    Currently in Pain?  No/denies         [facility] PT Assessment - 03/14/18 1110      Ambulation/Gait   Ambulation/Gait  Yes    Ambulation/Gait Assistance  7: Independent    Ambulation Distance (Feet)  400 Feet    Assistive device  None    Gait Pattern  Within Functional Limits    Ambulation Surface  Level    Gait velocity  3.82 ft/sec         Vestibular Assessment - 03/14/18 1115      Positional Testing   Sidelying Test  Sidelying Right;Sidelying Left      Sidelying Right   Sidelying Right Duration  5 seconds with longer latency>10 seconds    Sidelying Right Symptoms  Upbeat, right rotatory nystagmus      Sidelying Left   Sidelying Left Duration  did not retest *notes symptoms only to right               [facility] Adult PT Treatment/Exercise - 03/14/18 1110      Ambulation/Gait   Gait Comments  Gait with ball toss for vertical head motion and then horiozntal passing of the ball right and left sides.       Exercises   Exercises  Other Exercises    Other Exercises   Supine neck AROM rotation, chin tuck x 5 reps into provider's hands (*if  only using pillow, the patient elevates shoulders) and then added chin tuck + cervical rotaiton.      Vestibular Treatment/Exercise - 03/14/18 1118      Vestibular Treatment/Exercise   Vestibular Treatment Provided  Habituation;Gaze    Canalith Repositioning  Epley Manuever Right    Habituation Exercises  [employer]    Gaze Exercises  X1 Viewing Horizontal       EPLEY MANUEVER RIGHT   Number of Reps   1    Overall Response  Symptoms Resolved   no nystagmus viewed with R sidelying test after maneuver   Response Details   with vibration due to longer latency today      [name]   Number of Reps   3    Symptom Description   symtpoms fatigue with repetition      X1 Viewing Horizontal   Foot Position  seated    Comments  x 25 seconds x 3 reps with verbal and demo cues for posture and technique.      X1 Viewing Vertical   Foot Position  *deferred due to bifocal lenses              PT Short Term Goals - 03/14/18  1110      PT SHORT TERM GOAL #1   Title  The patient will have negative positional testing indicating resolution of R BPPV    Baseline  improved, but not resolved per + R sidelying test on first rep    Time  4    Period  Weeks    Status  Partially Met      PT SHORT TERM GOAL #2   Title  The patient will return demo HEP for habituation, high level balance and multi-sensory balance challenges.    Time  4    Period  Weeks    Status  Achieved      PT SHORT TERM GOAL #3   Title  Check gait speed and establish LTG.    Baseline  3.82 ft/sec.    Time  4    Period  Weeks    Status  Achieved      PT SHORT TERM GOAL #4   Title  Check SOT and establish LTG.    Baseline  scored 47% composite equilibrium score    Time  4    Period  Weeks    Status  Achieved        PT Long Term Goals - 03/14/18 1111      PT LONG TERM GOAL #1   Title  The patient will verbalize understanding of gym routine (has silver sneakers).    Time  8      PT LONG TERM GOAL #2   Title  Improve Berg score from 48/56 to > or equal to 52/56 to demo improving high level balance and dec'd risk for falls.    Time  8    Period  Weeks      PT LONG TERM GOAL #3   Title  Gait speed goal based on STG baseline.    Baseline  Tested and 3.82 ft/sec.    Time  8    Period  Weeks    Status  Deferred      PT LONG TERM GOAL #4   Title  The patient will improve on SOT by > or equal to 9% to demo improving multi-sensory balance use.    Baseline  to establish baseline    Time  8    Period  Weeks            Plan - 03/14/18 1458    Clinical Impression Statement  The patient has near normal gait speed.  He continues with postural deficits and R BPPV, however he is not performing HEP regularly for habituation.  PT treated using vibration today to see if it can improve outcome of canolith repsotioningmaneuvers.     PT Treatment/Interventions  ADLs/Self Care Home Management;Balance training;Neuromuscular re-education;Gait  training;Functional mobility training;Therapeutic activities;Therapeutic exercise;Manual techniques;Vestibular;Canalith Repostioning;Patient/family education;Passive range of motion;Dry needling    PT Next Visit Plan  Dynamic gait with head motion, habituation for turns.    Consulted and Agree with Plan of Care  Patient       Patient will benefit from skilled therapeutic intervention in order to improve the following deficits and impairments:  Abnormal gait, Decreased activity tolerance, Decreased balance, Dizziness, Impaired sensation, Decreased mobility, Postural dysfunction, Decreased range of motion  Visit Diagnosis: BPPV (benign paroxysmal positional vertigo), right  Unsteadiness on feet  Other abnormalities of gait and mobility     Problem List Patient Active Problem List   Diagnosis Date Noted  . BPPV (benign paroxysmal positional vertigo), left 12/20/2015  . Syncope   . Dizziness     Alzina Golda, PT 03/14/2018, 3:01 PM  [facility] [address] [place], [place], [address] Phone: [phone]   Fax:  [phone]  Name: Nicholas Webb MRN: [id number] Date of Birth: [birth year]

## 2018-03-21 ENCOUNTER — Encounter: Payer: Self-pay | Admitting: Rehabilitative and Restorative Service Providers"

## 2018-03-21 ENCOUNTER — Ambulatory Visit: Payer: Medicare Other | Admitting: Rehabilitative and Restorative Service Providers"

## 2018-03-21 DIAGNOSIS — H8111 Benign paroxysmal vertigo, right ear: Secondary | ICD-10-CM

## 2018-03-21 DIAGNOSIS — R2689 Other abnormalities of gait and mobility: Secondary | ICD-10-CM

## 2018-03-21 DIAGNOSIS — R2681 Unsteadiness on feet: Secondary | ICD-10-CM

## 2018-03-21 NOTE — Therapy (Signed)
[facility] [address] [place], [place], [address] Phone: [phone]   Fax:  [phone]  Physical Therapy Treatment  Patient Details  Name: Nicholas Webb MRN: [id number] Date of Birth: 09-29-1940 Referring Provider (PT): Metta Clines, DO   Encounter Date: 03/21/2018  PT End of Session - 03/21/18 1152    Visit Number  5    Number of Visits  8    Date for PT Re-Evaluation  04/01/18    Authorization Type  [employer] medicare, $20 copay    PT Start Time  1145    PT Stop Time  1225    PT Time Calculation (min)  40 min    Activity Tolerance  Patient tolerated treatment well    Behavior During Therapy  [facility] for tasks assessed/performed       Past Medical History:  Diagnosis Date  . Anemia of chronic disease   . Atherosclerosis of both carotid arteries   . Barrett's esophagus   . Dizziness   . GERD (gastroesophageal reflux disease)   . Hx of adenomatous colonic polyps   . Hyperlipidemia   . Macrocytosis without anemia   . Obesity   . OSA (obstructive sleep apnea)   . Rheumatoid arthritis(714.0)   . Syncope     Past Surgical History:  Procedure Laterality Date  . JOINT REPLACEMENT  2004   left    There were no vitals filed for this visit.  Subjective Assessment - 03/21/18 1146    Subjective  The patient reports he felt bad after last week's treatment.  He fell asleep in his truck for 2 hours before driving home.  He felt better after taking a nap. He reports that he has not had any dizziness when getitng into bed since last session.    Pertinent History   rheumatoid arthritis, obstructive sleep apnea, hyperlipidemia, and carotid artery disease who presents for unsteady gait and problems with memory    Patient Stated Goals  Get this cleared up to "get my wife off my back". (more of the staggery feeling)    Currently in Pain?  No/denies         [facility] PT Assessment - 03/21/18 1156      Dynamic Standing  Balance   Dynamic Standing - Comments  Sensory organization testing scoring 48% compared to age/height norm of 65%.  Patient WNLs use of somatosensory and visual inputs and 0% use of vestibular inputs for balance.  He has "falls" on 3/3 trials of conditions 5 and 6.  He scored 47% on 02/07/2018 (no significant clinical change).      Standardized Balance Assessment   Standardized Balance Assessment  Balance Master Testing;Berg Balance Test    Balance Master Testing  Sensory Organization Test      Berg Balance Test   Sit to Stand  Able to stand without using hands and stabilize independently    Standing Unsupported  Able to stand safely 2 minutes    Sitting with Back Unsupported but Feet Supported on Floor or Stool  Able to sit safely and securely 2 minutes    Stand to Sit  Sits safely with minimal use of hands    Transfers  Able to transfer safely, minor use of hands    Standing Unsupported with Eyes Closed  Able to stand 10 seconds safely    Standing Ubsupported with Feet Together  Able to place feet together independently and stand 1 minute safely    From Standing, Reach  Forward with Outstretched Arm  Can reach confidently >25 cm (10")    From Standing Position, Pick up Object from [place] to pick up shoe safely and easily    From Standing Position, Turn to Look Behind Over each Shoulder  Looks behind from both sides and weight shifts well    Turn 360 Degrees  Able to turn 360 degrees safely but slowly    Standing Unsupported, Alternately Place Feet on Step/Stool  Able to stand independently and safely and complete 8 steps in 20 seconds    Standing Unsupported, One Foot in Front  Able to plae foot ahead of the other independently and hold 30 seconds    Standing on One Leg  Tries to lift leg/unable to hold 3 seconds but remains standing independently    Total Score  50    Berg comment:  50/56         Vestibular Assessment - 03/21/18 1153      Positional Testing   Sidelying Test   Sidelying Right;Sidelying Left      Sidelying Right   Sidelying Right Duration  3-4 seconds; feeling a slight sense of headache or vibration    Sidelying Right Symptoms  No nystagmus   viewed in room light              [facility] Adult PT Treatment/Exercise - 03/21/18 2003      Self-Care   Self-Care  Other Self-Care Comments    Other Self-Care Comments   REcommended community exercise program.        Neuro Re-ed    Neuro Re-ed Details   Reviewed HEP and emphasized need for foam standing + eyes closed.  Patient notes he is not performing as regularly as recommended.       Vestibular Treatment/Exercise - 03/21/18 1155      Vestibular Treatment/Exercise   Vestibular Treatment Provided  Habituation    Habituation Exercises  Legrand Como Daroff   Number of Reps   2    Symptom Description   resolved on 2nd repetition              PT Short Term Goals - 03/14/18 1110      PT SHORT TERM GOAL #1   Title  The patient will have negative positional testing indicating resolution of R BPPV    Baseline  improved, but not resolved per + R sidelying test on first rep    Time  4    Period  Weeks    Status  Partially Met      PT SHORT TERM GOAL #2   Title  The patient will return demo HEP for habituation, high level balance and multi-sensory balance challenges.    Time  4    Period  Weeks    Status  Achieved      PT SHORT TERM GOAL #3   Title  Check gait speed and establish LTG.    Baseline  3.82 ft/sec.    Time  4    Period  Weeks    Status  Achieved      PT SHORT TERM GOAL #4   Title  Check SOT and establish LTG.    Baseline  scored 47% composite equilibrium score    Time  4    Period  Weeks    Status  Achieved        PT Long Term Goals - 03/21/18 1155  PT LONG TERM GOAL #1   Title  The patient will verbalize understanding of gym routine (has silver sneakers).    Time  8    Status  Achieved      PT LONG TERM GOAL #2   Title  Improve Berg  score from 48/56 to > or equal to 52/56 to demo improving high level balance and dec'd risk for falls.    Baseline  50/56.    Time  8    Period  Weeks    Status  Partially Met      PT LONG TERM GOAL #3   Title  Gait speed goal based on STG baseline.    Baseline  Tested and 3.82 ft/sec.    Time  8    Period  Weeks    Status  Deferred      PT LONG TERM GOAL #4   Title  The patient will improve on SOT by > or equal to 9% to demo improving multi-sensory balance use.    Baseline  47% increased to 48%.    Time  8    Period  Weeks    Status  Not Met            Plan - 03/21/18 2012    Clinical Impression Statement  The patient has partially met LTGs.  He is demonstrating improved posture, has close to normal gait speed, and has comprehensive HEP to address remaining deficits.  He has not shown improvement in use of vestibular inputs per SOT.  He continued today to have "falls" on conditions 5 and 6 in multi-sensory environments.  The patient notes no sensations of dizziness during daily tasks, but does have a trace subjective report during R sidelying.  No nystagmus were vieed in room light today and PT encourages patient to continue habituation.  We plan to f/u as needed after patient emphasizes home exercise program to address specific impairments.      PT Treatment/Interventions  ADLs/Self Care Home Management;Balance training;Neuromuscular re-education;Gait training;Functional mobility training;Therapeutic activities;Therapeutic exercise;Manual techniques;Vestibular;Canalith Repostioning;Patient/family education;Passive range of motion;Dry needling    PT Next Visit Plan  Patient to work on performing HEP regularly, beginning to perform community wellness.      Consulted and Agree with Plan of Care  Patient       Patient will benefit from skilled therapeutic intervention in order to improve the following deficits and impairments:  Abnormal gait, Decreased activity tolerance, Decreased  balance, Dizziness, Impaired sensation, Decreased mobility, Postural dysfunction, Decreased range of motion  Visit Diagnosis: BPPV (benign paroxysmal positional vertigo), right  Unsteadiness on feet  Other abnormalities of gait and mobility     Problem List Patient Active Problem List   Diagnosis Date Noted  . BPPV (benign paroxysmal positional vertigo), left 12/20/2015  . Syncope   . Dizziness     Hang Ammon, PT 03/21/2018, 8:33 PM  [facility] [address] [place], [place], [address] Phone: [phone]   Fax:  [phone]  Name: Nicholas Webb MRN: [id number] Date of Birth: 1940/09/24

## 2018-03-28 ENCOUNTER — Ambulatory Visit: Payer: Medicare Other | Admitting: Rehabilitative and Restorative Service Providers"

## 2018-05-31 ENCOUNTER — Other Ambulatory Visit: Payer: Self-pay

## 2018-05-31 ENCOUNTER — Telehealth (INDEPENDENT_AMBULATORY_CARE_PROVIDER_SITE_OTHER): Payer: Medicare Other | Admitting: Neurology

## 2018-05-31 ENCOUNTER — Encounter: Payer: Self-pay | Admitting: Neurology

## 2018-05-31 VITALS — BP 143/78 | HR 78 | Wt 192.0 lb

## 2018-05-31 DIAGNOSIS — R413 Other amnesia: Secondary | ICD-10-CM | POA: Diagnosis not present

## 2018-05-31 DIAGNOSIS — G44219 Episodic tension-type headache, not intractable: Secondary | ICD-10-CM

## 2018-05-31 DIAGNOSIS — R030 Elevated blood-pressure reading, without diagnosis of hypertension: Secondary | ICD-10-CM

## 2018-05-31 DIAGNOSIS — H8110 Benign paroxysmal vertigo, unspecified ear: Secondary | ICD-10-CM | POA: Diagnosis not present

## 2018-05-31 DIAGNOSIS — M542 Cervicalgia: Secondary | ICD-10-CM | POA: Diagnosis not present

## 2018-05-31 NOTE — Progress Notes (Addendum)
Virtual Visit via Video Note The purpose of this virtual visit is to provide medical care while limiting exposure to the novel coronavirus.    Consent was obtained for video visit:  Yes.   Answered questions that patient had about telehealth interaction:  Yes.   I discussed the limitations, risks, security and privacy concerns of performing an evaluation and management service by telemedicine. I also discussed with the patient that there may be a patient responsible charge related to this service. The patient expressed understanding and agreed to proceed.  Pt location: Home Physician Location: office Name of referring provider:  Maurice Small, MD I connected with [name] at patients initiation/request on 05/31/2018 at  9:00 AM EDT by video enabled telemedicine application and verified that I am speaking with the correct person using two identifiers. Pt MRN:  [id number] Pt DOB:  01-May-1940   History of Present Illness:  Nicholas Webb is a 78 year old right-handed male with rheumatoid arthritis, obstructive sleep apnea, hyperlipidemia, and carotid artery disease who follows up for memory problems, tension type headache with cervicalgia and benign paroxysmal positional vertigo.  He is accompanied by his daughter who supplements history.  UPDATE: For headache and neck pain, he was started on gabapentin but it made him feel "loopy" so he discontinued it.  Instead, he was started on tizanidine 2mg  at bedtime.  He is feeling well.  Headaches and neck pain have pretty much resolved.  If he is sitting at the computer for any significant time, he may feel a popping in his neck.  He was referred to vestibular rehab for BPPV.  Vertigo has resolved. Memory is stable.  HISTORY: Since 2017, he has had vertigo, described as a spinning sensation with change in position, lasting a couple of minutes.  There was no associated double vision or nausea.  CT of head from 08/12/15 was personally  reviewed and was normal.  Carotid ultrasound from 08/26/2015 revealed minor carotid atherosclerosis bilaterally but without hemodynamically significant stenosis.  Patent antegrade flow of the vertebral arteries were noted.  He reportedly had an abnormal EKG with right bundle branch block.  Echocardiogram from 11/05/2015 revealed ejection fraction of 55-60% with grade 1 diastolic dysfunction and mildly dilated atrium but otherwise unremarkable.  He went to vestibular rehab which resolved the problem.  6 months ago, it started to recur.  It is not as severe as the first episode.  He can get up out of the bed and feel lightheaded.  When he has the dizziness, he may sometimes be unsteady.  He has had a couple of falls over the past year, which he attributes to tripping.  He also endorses a moderate bi-frontal pressure-like headache.  No associated symptoms.  They are triggered when bending over (body or neck), lasting for just as long as he is bent over.  It occurs every time he bends over.  He also has had some memory deficits.  For example, he forgot to pay insurance bill.  He may forget to do things that his wife asks him to perform.  B12 and TSH reportedly normal.  There is no family history of dementia.  Past medications:  Gabapentin (unable to tolerate)  Past Medical History: Past Medical History:  Diagnosis Date   Anemia of chronic disease    Atherosclerosis of both carotid arteries    Barrett's esophagus    Dizziness    GERD (gastroesophageal reflux disease)    Hx of adenomatous colonic polyps    Hyperlipidemia  Macrocytosis without anemia    Obesity    OSA (obstructive sleep apnea)    Rheumatoid arthritis(714.0)    Syncope     Medications: Outpatient Encounter Medications as of 05/31/2018  Medication Sig   Adalimumab (HUMIRA) 40 MG/0.8ML PSKT TAKE SUBCUTANEOUS EVERY TWO WEEKS   esomeprazole (NEXIUM) 20 MG capsule Take 40 mg by mouth daily at 12 noon.   folic acid (FOLVITE)  1 MG tablet Take 1 mg by mouth daily.   methotrexate (RHEUMATREX) 2.5 MG tablet Caution:Chemotherapy. Protect from light. Mondays  Patient takes 5 tablets by mouth on Sundays  And 4 tablets by mouth on Mondays.   Multiple Vitamin (MULTIVITAMIN WITH MINERALS) TABS Take 1 tablet by mouth daily.   naproxen sodium (ALEVE) 220 MG tablet Take 220 mg by mouth.   tizanidine (ZANAFLEX) 2 MG capsule Take 1 capsule (2 mg total) by mouth at bedtime.   gabapentin (NEURONTIN) 100 MG capsule TAKE 2 CAPSULES BY MOUTH AT BEDTIME (Patient not taking: Reported on 05/31/2018)   Vitamins/Minerals TABS Take by mouth.   No facility-administered encounter medications on file as of 05/31/2018.    Allergies: No known allergies.  Family History: Family History  Problem Relation Age of Onset   Stroke Mother    Glaucoma Mother    Cancer Father    Prostate cancer Father    Colon cancer Father    Social History: Social History   Socioeconomic History   Marital status: Married    Spouse name: Katharine Look   Number of children: 2   Years of education: Not on file   Highest education level: Bachelor's degree (e.g., BA, AB, BS)  Occupational History    Employer: [employer]  Social Needs   Financial resource strain: Not on file   Food insecurity:    Worry: Not on file    Inability: Not on file   Transportation needs:    Medical: Not on file    Non-medical: Not on file  Tobacco Use   Smoking status: Never Smoker   Smokeless tobacco: Never Used  Substance and Sexual Activity   Alcohol use: Yes   Drug use: No   Sexual activity: Not on file  Lifestyle   Physical activity:    Days per week: Not on file    Minutes per session: Not on file   Stress: Not on file  Relationships   Social connections:    Talks on phone: Not on file    Gets together: Not on file    Attends religious service: Not on file    Active member of club or organization: Not on file    Attends  meetings of clubs or organizations: Not on file    Relationship status: Not on file   Intimate partner violence:    Fear of current or ex partner: Not on file    Emotionally abused: Not on file    Physically abused: Not on file    Forced sexual activity: Not on file  Other Topics Concern   Not on file  Social History Narrative   Patient is right-handed.  He lives with his wife in a one level home. He does not exercise.    Review of Systems: Review of Systems  Constitutional: Negative for chills, diaphoresis, fever, malaise/fatigue and weight loss.  HENT: Negative.  Negative for congestion, ear discharge, ear pain, nosebleeds, sinus pain, sore throat and tinnitus.   Eyes: Negative.  Negative for blurred vision, double vision, pain, discharge and redness.  Respiratory: Negative.  Negative for cough, hemoptysis, shortness of breath and wheezing.   Cardiovascular: Negative.  Negative for chest pain, palpitations and orthopnea.  Gastrointestinal: Negative.  Negative for abdominal pain, constipation, diarrhea, nausea and vomiting.  Genitourinary: Negative.  Negative for dysuria.  Musculoskeletal: Negative.  Negative for back pain, myalgias and neck pain.  Skin: Negative for itching and rash.  Neurological: Negative.  Negative for dizziness, tremors, focal weakness, seizures and loss of consciousness.  Endo/Heme/Allergies: Negative.  Negative for environmental allergies and polydipsia. Does not bruise/bleed easily.  Psychiatric/Behavioral: Negative.  Negative for depression.   Observations/Objective:   Blood pressure (!) 143/78, pulse 78, weight 192 lb (87.1 kg). Patient in no acute distress; well-groomed; alert and oriented to person, place, and time. Attention span and concentration intact, recalled 3 words after 5 minutes, remote memory intact, fund of knowledge intact.  Speech fluent and not dysarthric, language intact.  Pupils equal and round.  Face symmetric.  Facial sensation intact.   Tongue midline.  No pronator drift.  Able to move all extremities against gravity. Finger to nose testing intact.  Mildly wide-based gait with slight right limp and torso side bent right.  Romberg with mild sway.  Assessment and Plan:   1.  Tension-type headache, likely cervicogenic given that he has likely arthritis in the cervical spine and the pain is triggered with neck flexion. 2.  Memory deficits.  Based on history, he has not had any significant recurrent memory deficits.  He did miss paying 1 insurance bill but otherwise nothing suspicious.  Prior MOCA score was below normal but not significant.  Mild cognitive impairment may be possible but could be just part of normal aging. 3.  Benign paroxysmal positional vertigo 4.  Unsteady gait, likely multifactorial.  He exhibits neuropathy in the feet which may be related to lumbar stenosis (slight asymmetry).  He also has arthritic changes related to RA, which is likely contributing to gait abnormality. 5.  Elevated blood pressure.  It may be elevated since he had just returned from walking his dogs.  1.  Tizanidine 2mg  at bedtime for tension-type headache and cervicalgia 2.  Advised to recheck blood pressure in a couple of hours after resting.  If still elevated, then to contact PCP office. 3.  Limit use of pain relievers to no more than 2 days out of week to prevent risk of rebound or medication-overuse headache. 4.  Keep headache diary 5.  Follow up in 6 months.   Follow Up Instructions:    -I discussed the assessment and treatment plan with the patient. The patient was provided an opportunity to ask questions and all were answered. The patient agreed with the plan and demonstrated an understanding of the instructions.   The patient was advised to call back or seek an in-person evaluation if the symptoms worsen or if the condition fails to improve as anticipated.    Dudley Major, DO

## 2018-06-07 ENCOUNTER — Ambulatory Visit: Payer: Medicare Other | Admitting: Neurology

## 2018-06-23 ENCOUNTER — Emergency Department (HOSPITAL_COMMUNITY): Payer: Medicare Other

## 2018-06-23 ENCOUNTER — Other Ambulatory Visit: Payer: Self-pay

## 2018-06-23 ENCOUNTER — Inpatient Hospital Stay (HOSPITAL_COMMUNITY)
Admission: EM | Admit: 2018-06-23 | Discharge: 2018-06-28 | DRG: 246 | Disposition: A | Discharge status: Home / Self Care | Payer: Medicare Other | Attending: Internal Medicine | Admitting: Internal Medicine

## 2018-06-23 ENCOUNTER — Encounter (HOSPITAL_COMMUNITY): Payer: Self-pay

## 2018-06-23 ENCOUNTER — Inpatient Hospital Stay (HOSPITAL_COMMUNITY): Payer: Medicare Other

## 2018-06-23 DIAGNOSIS — M159 Polyosteoarthritis, unspecified: Secondary | ICD-10-CM

## 2018-06-23 DIAGNOSIS — Z8042 Family history of malignant neoplasm of prostate: Secondary | ICD-10-CM

## 2018-06-23 DIAGNOSIS — J8410 Pulmonary fibrosis, unspecified: Secondary | ICD-10-CM | POA: Diagnosis present

## 2018-06-23 DIAGNOSIS — W19XXXA Unspecified fall, initial encounter: Secondary | ICD-10-CM | POA: Diagnosis not present

## 2018-06-23 DIAGNOSIS — S0083XA Contusion of other part of head, initial encounter: Secondary | ICD-10-CM | POA: Diagnosis present

## 2018-06-23 DIAGNOSIS — I82412 Acute embolism and thrombosis of left femoral vein: Secondary | ICD-10-CM | POA: Diagnosis present

## 2018-06-23 DIAGNOSIS — I4510 Unspecified right bundle-branch block: Secondary | ICD-10-CM | POA: Diagnosis not present

## 2018-06-23 DIAGNOSIS — R001 Bradycardia, unspecified: Secondary | ICD-10-CM | POA: Diagnosis present

## 2018-06-23 DIAGNOSIS — I2510 Atherosclerotic heart disease of native coronary artery without angina pectoris: Secondary | ICD-10-CM | POA: Diagnosis not present

## 2018-06-23 DIAGNOSIS — Z6828 Body mass index (BMI) 28.0-28.9, adult: Secondary | ICD-10-CM

## 2018-06-23 DIAGNOSIS — D496 Neoplasm of unspecified behavior of brain: Secondary | ICD-10-CM

## 2018-06-23 DIAGNOSIS — K760 Fatty (change of) liver, not elsewhere classified: Secondary | ICD-10-CM | POA: Diagnosis present

## 2018-06-23 DIAGNOSIS — R0602 Shortness of breath: Secondary | ICD-10-CM

## 2018-06-23 DIAGNOSIS — E669 Obesity, unspecified: Secondary | ICD-10-CM | POA: Diagnosis present

## 2018-06-23 DIAGNOSIS — M898X8 Other specified disorders of bone, other site: Secondary | ICD-10-CM | POA: Diagnosis not present

## 2018-06-23 DIAGNOSIS — K219 Gastro-esophageal reflux disease without esophagitis: Secondary | ICD-10-CM

## 2018-06-23 DIAGNOSIS — Y92009 Unspecified place in unspecified non-institutional (private) residence as the place of occurrence of the external cause: Secondary | ICD-10-CM | POA: Diagnosis not present

## 2018-06-23 DIAGNOSIS — I2699 Other pulmonary embolism without acute cor pulmonale: Secondary | ICD-10-CM | POA: Diagnosis present

## 2018-06-23 DIAGNOSIS — Z83511 Family history of glaucoma: Secondary | ICD-10-CM

## 2018-06-23 DIAGNOSIS — G9389 Other specified disorders of brain: Secondary | ICD-10-CM | POA: Diagnosis not present

## 2018-06-23 DIAGNOSIS — K2270 Barrett's esophagus without dysplasia: Secondary | ICD-10-CM | POA: Diagnosis present

## 2018-06-23 DIAGNOSIS — Z86010 Personal history of colonic polyps: Secondary | ICD-10-CM

## 2018-06-23 DIAGNOSIS — G4733 Obstructive sleep apnea (adult) (pediatric): Secondary | ICD-10-CM | POA: Diagnosis present

## 2018-06-23 DIAGNOSIS — R42 Dizziness and giddiness: Secondary | ICD-10-CM | POA: Diagnosis not present

## 2018-06-23 DIAGNOSIS — H8110 Benign paroxysmal vertigo, unspecified ear: Secondary | ICD-10-CM | POA: Diagnosis present

## 2018-06-23 DIAGNOSIS — R008 Other abnormalities of heart beat: Secondary | ICD-10-CM | POA: Diagnosis not present

## 2018-06-23 DIAGNOSIS — K210 Gastro-esophageal reflux disease with esophagitis: Secondary | ICD-10-CM | POA: Diagnosis present

## 2018-06-23 DIAGNOSIS — Z955 Presence of coronary angioplasty implant and graft: Secondary | ICD-10-CM

## 2018-06-23 DIAGNOSIS — I214 Non-ST elevation (NSTEMI) myocardial infarction: Secondary | ICD-10-CM | POA: Diagnosis present

## 2018-06-23 DIAGNOSIS — M899 Disorder of bone, unspecified: Secondary | ICD-10-CM | POA: Diagnosis present

## 2018-06-23 DIAGNOSIS — I361 Nonrheumatic tricuspid (valve) insufficiency: Secondary | ICD-10-CM | POA: Diagnosis not present

## 2018-06-23 DIAGNOSIS — E785 Hyperlipidemia, unspecified: Secondary | ICD-10-CM

## 2018-06-23 DIAGNOSIS — D7589 Other specified diseases of blood and blood-forming organs: Secondary | ICD-10-CM | POA: Diagnosis present

## 2018-06-23 DIAGNOSIS — I25110 Atherosclerotic heart disease of native coronary artery with unstable angina pectoris: Secondary | ICD-10-CM | POA: Diagnosis present

## 2018-06-23 DIAGNOSIS — R55 Syncope and collapse: Secondary | ICD-10-CM | POA: Diagnosis present

## 2018-06-23 DIAGNOSIS — G44209 Tension-type headache, unspecified, not intractable: Secondary | ICD-10-CM | POA: Diagnosis present

## 2018-06-23 DIAGNOSIS — Z79899 Other long term (current) drug therapy: Secondary | ICD-10-CM

## 2018-06-23 DIAGNOSIS — Z823 Family history of stroke: Secondary | ICD-10-CM

## 2018-06-23 DIAGNOSIS — Z800 Family history of malignant neoplasm of digestive organs: Secondary | ICD-10-CM

## 2018-06-23 DIAGNOSIS — Z23 Encounter for immunization: Secondary | ICD-10-CM | POA: Diagnosis present

## 2018-06-23 DIAGNOSIS — I200 Unstable angina: Secondary | ICD-10-CM | POA: Diagnosis not present

## 2018-06-23 DIAGNOSIS — M069 Rheumatoid arthritis, unspecified: Secondary | ICD-10-CM | POA: Diagnosis present

## 2018-06-23 DIAGNOSIS — D638 Anemia in other chronic diseases classified elsewhere: Secondary | ICD-10-CM | POA: Diagnosis present

## 2018-06-23 DIAGNOSIS — Z7982 Long term (current) use of aspirin: Secondary | ICD-10-CM

## 2018-06-23 DIAGNOSIS — D333 Benign neoplasm of cranial nerves: Secondary | ICD-10-CM | POA: Diagnosis present

## 2018-06-23 DIAGNOSIS — Z87891 Personal history of nicotine dependence: Secondary | ICD-10-CM

## 2018-06-23 DIAGNOSIS — G939 Disorder of brain, unspecified: Secondary | ICD-10-CM | POA: Diagnosis present

## 2018-06-23 DIAGNOSIS — H8112 Benign paroxysmal vertigo, left ear: Secondary | ICD-10-CM | POA: Diagnosis present

## 2018-06-23 LAB — CBC WITH DIFFERENTIAL/PLATELET
Abs Immature Granulocytes: 0.03 10*3/uL (ref 0.00–0.07)
Basophils Absolute: 0 10*3/uL (ref 0.0–0.1)
Basophils Relative: 0 %
Eosinophils Absolute: 0 10*3/uL (ref 0.0–0.5)
Eosinophils Relative: 0 %
HCT: 41.4 % (ref 39.0–52.0)
Hemoglobin: 13.4 g/dL (ref 13.0–17.0)
Immature Granulocytes: 0 %
Lymphocytes Relative: 20 %
Lymphs Abs: 1.6 10*3/uL (ref 0.7–4.0)
MCH: 33.5 pg (ref 26.0–34.0)
MCHC: 32.4 g/dL (ref 30.0–36.0)
MCV: 103.5 fL — ABNORMAL HIGH (ref 80.0–100.0)
Monocytes Absolute: 0.5 10*3/uL (ref 0.1–1.0)
Monocytes Relative: 7 %
Neutro Abs: 5.7 10*3/uL (ref 1.7–7.7)
Neutrophils Relative %: 73 %
Platelets: 158 10*3/uL (ref 150–400)
RBC: 4 MIL/uL — ABNORMAL LOW (ref 4.22–5.81)
RDW: 14.6 % (ref 11.5–15.5)
WBC: 7.9 10*3/uL (ref 4.0–10.5)
nRBC: 0 % (ref 0.0–0.2)

## 2018-06-23 LAB — LIPID PANEL
Cholesterol: 155 mg/dL (ref 0–200)
HDL: 45 mg/dL (ref >40)
LDL Cholesterol: 103 mg/dL — ABNORMAL HIGH (ref 0–99)
Total CHOL/HDL Ratio: 3.4 RATIO
Triglycerides: 36 mg/dL (ref <150)
VLDL: 7 mg/dL (ref 0–40)

## 2018-06-23 LAB — BASIC METABOLIC PANEL
Anion gap: 15 (ref 5–15)
BUN: 22 mg/dL (ref 8–23)
CO2: 22 mmol/L (ref 22–32)
Calcium: 9.5 mg/dL (ref 8.9–10.3)
Chloride: 103 mmol/L (ref 98–111)
Creatinine, Ser: 1.04 mg/dL (ref 0.61–1.24)
GFR calc Af Amer: >60 mL/min (ref >60)
GFR calc non Af Amer: >60 mL/min (ref >60)
Glucose, Bld: 135 mg/dL — ABNORMAL HIGH (ref 70–99)
Potassium: 4.2 mmol/L (ref 3.5–5.1)
Sodium: 140 mmol/L (ref 135–145)

## 2018-06-23 LAB — TROPONIN I
Troponin I: 0.32 ng/mL (ref <0.03)
Troponin I: 0.33 ng/mL (ref <0.03)

## 2018-06-23 LAB — MAGNESIUM: Magnesium: 2 mg/dL (ref 1.7–2.4)

## 2018-06-23 LAB — HEMOGLOBIN A1C
Hgb A1c MFr Bld: 5.9 % — ABNORMAL HIGH (ref 4.8–5.6)
Mean Plasma Glucose: 122.63 mg/dL

## 2018-06-23 LAB — BRAIN NATRIURETIC PEPTIDE: B Natriuretic Peptide: 314.2 pg/mL — ABNORMAL HIGH (ref 0.0–100.0)

## 2018-06-23 LAB — TSH: TSH: 2.365 u[IU]/mL (ref 0.350–4.500)

## 2018-06-23 MED ORDER — ACETAMINOPHEN 325 MG PO TABS: 650.0000 mg | ORAL_TABLET | Freq: Four times a day (QID) | ORAL | Status: DC | PRN

## 2018-06-23 MED ORDER — ACETAMINOPHEN 650 MG RE SUPP: 650.0000 mg | Freq: Four times a day (QID) | RECTAL | Status: DC | PRN

## 2018-06-23 MED ORDER — HEPARIN (PORCINE) 25000 UT/250ML-% IV SOLN
1100.0000 [IU]/h | INTRAVENOUS | Status: DC
  Administered 2018-06-23: 1100 [IU]/h via INTRAVENOUS
  Filled 2018-06-23 (×2): qty 250

## 2018-06-23 MED ORDER — FLEET ENEMA 7-19 GM/118ML RE ENEM: 1.0000 | ENEMA | Freq: Once | RECTAL | Status: DC | PRN

## 2018-06-23 MED ORDER — ASPIRIN 81 MG PO CHEW
324.0000 mg | CHEWABLE_TABLET | Freq: Once | ORAL | Status: AC
  Administered 2018-06-23: 324 mg via ORAL
  Filled 2018-06-23: qty 4

## 2018-06-23 MED ORDER — HEPARIN BOLUS VIA INFUSION
4000.0000 [IU] | Freq: Once | INTRAVENOUS | Status: AC
  Administered 2018-06-23: 4000 [IU] via INTRAVENOUS
  Filled 2018-06-23: qty 4000

## 2018-06-23 MED ORDER — ASPIRIN EC 81 MG PO TBEC: 81.0000 mg | DELAYED_RELEASE_TABLET | Freq: Every day | ORAL | Status: DC

## 2018-06-23 MED ORDER — ONDANSETRON HCL 4 MG PO TABS: 4.0000 mg | ORAL_TABLET | Freq: Four times a day (QID) | ORAL | Status: DC | PRN

## 2018-06-23 MED ORDER — HYDRALAZINE HCL 20 MG/ML IJ SOLN: 10.0000 mg | INTRAMUSCULAR | Status: DC | PRN

## 2018-06-23 MED ORDER — BISACODYL 5 MG PO TBEC: 5.0000 mg | DELAYED_RELEASE_TABLET | Freq: Every day | ORAL | Status: DC | PRN

## 2018-06-23 MED ORDER — TIZANIDINE HCL 2 MG PO TABS
2.0000 mg | ORAL_TABLET | Freq: Every day | ORAL | Status: DC
  Administered 2018-06-23 – 2018-06-27 (×5): 2 mg via ORAL
  Filled 2018-06-23 (×6): qty 1

## 2018-06-23 MED ORDER — ONDANSETRON HCL 4 MG/2ML IJ SOLN: 4.0000 mg | Freq: Four times a day (QID) | INTRAMUSCULAR | Status: DC | PRN

## 2018-06-23 MED ORDER — TETANUS-DIPHTH-ACELL PERTUSSIS 5-2.5-18.5 LF-MCG/0.5 IM SUSP
0.5000 mL | Freq: Once | INTRAMUSCULAR | Status: AC
  Administered 2018-06-23: 0.5 mL via INTRAMUSCULAR
  Filled 2018-06-23: qty 0.5

## 2018-06-23 MED ORDER — SENNOSIDES-DOCUSATE SODIUM 8.6-50 MG PO TABS: 1.0000 | ORAL_TABLET | Freq: Every evening | ORAL | Status: DC | PRN

## 2018-06-23 MED ORDER — SODIUM CHLORIDE 0.9 % IV SOLN
INTRAVENOUS | Status: DC
  Administered 2018-06-23 – 2018-06-24 (×3): via INTRAVENOUS

## 2018-06-23 MED ORDER — PANTOPRAZOLE SODIUM 40 MG PO TBEC
40.0000 mg | DELAYED_RELEASE_TABLET | Freq: Every day | ORAL | Status: DC
  Administered 2018-06-24 – 2018-06-28 (×5): 40 mg via ORAL
  Filled 2018-06-23 (×5): qty 1

## 2018-06-23 MED ORDER — FOLIC ACID 1 MG PO TABS
2.0000 mg | ORAL_TABLET | Freq: Every day | ORAL | Status: DC
  Administered 2018-06-24 – 2018-06-28 (×5): 2 mg via ORAL
  Filled 2018-06-23 (×5): qty 2

## 2018-06-23 MED ORDER — HYDROCODONE-ACETAMINOPHEN 5-325 MG PO TABS: 1.0000 | ORAL_TABLET | ORAL | Status: DC | PRN

## 2018-06-23 NOTE — Consult Note (Addendum)
Cardiology Consultation:   Patient ID: Nicholas Webb; [id number]; 1940/07/17   Admit date: 06/23/2018 Date of Consult: 06/23/2018  Primary Care Provider: Maurice Small, MD Primary Cardiologist: Candee Furbish, MD 10/21/2015 Primary Electrophysiologist:  None   Patient Profile:   Nicholas Webb is a 78 y.o. male with a hx of GERD, Barrett's esophagus, OSA, RA, HLD, minimal carotid disease in 2017, normal GXT 2007, RBBB, who is being seen today for the evaluation of elevated troponin at the request of Dr. Wilson Singer.  History of Present Illness:   Mr. [name] was evaluated by Dr. Marlou Porch in 2017 for dizziness and worsening presyncope.  The symptoms were positional/postural.  He also reported symptoms when tilting his head back, but carotid Doppler showed no significant disease.  The symptoms were felt similar to vertigo, and echo showed no significant abnormalities.  Symptoms felt related to equilibrium or a vestibular problem.    Mr. [name] has been having exertional chest pain for approximately 2 weeks.  He would notice it when he mowed the grass.  It was a tightness, 2-3/10.  He did not feel any change in his normal dyspnea on exertion, there was no nausea or diaphoresis.  The symptoms would resolve with rest.  The last time he had the symptoms was yesterday, while mowing the grass.  They have not changed in frequency or intensity since they started, a couple of weeks ago.  Today, he had an episode of dizziness that was significant.  He does not think he completely lost consciousness.  He fell to his knees and then fell forward.  He has some facial injuries from this.  He was able to crawl back to bed.  The dizziness persisted, but improved when he laid on his right side.  He remained there until approximately 3:00 this afternoon, when his daughter came by.  His his symptoms were no better, so he came to the emergency room.  Currently, he is pain-free, and he is not dizzy.  He has  occasional PVCs on the monitor, but is not aware of them.  Other than some discomfort from his facial injuries, he has no complaints.  He has been having headaches recently.  He has had vestibular therapy several times for the dizziness, which helped.  However, the dizziness was much worse than usual today.  He has not previously fallen.   Past Medical History:  Diagnosis Date  . Anemia of chronic disease   . Atherosclerosis of both carotid arteries   . Barrett's esophagus   . Dizziness   . GERD (gastroesophageal reflux disease)   . Hx of adenomatous colonic polyps   . Hyperlipidemia   . Macrocytosis without anemia   . Obesity   . OSA (obstructive sleep apnea)   . Rheumatoid arthritis(714.0)   . Syncope     Past Surgical History:  Procedure Laterality Date  . JOINT REPLACEMENT  2004   left     Prior to Admission medications   Medication Sig Start Date End Date Taking? Authorizing Provider  Adalimumab (HUMIRA) 40 MG/0.8ML PSKT TAKE SUBCUTANEOUS EVERY TWO WEEKS    [provider]  esomeprazole (NEXIUM) 20 MG capsule Take 40 mg by mouth daily at 12 noon.    [provider]  folic acid (FOLVITE) 1 MG tablet Take 1 mg by mouth daily.    [provider]  gabapentin (NEURONTIN) 100 MG capsule TAKE 2 CAPSULES BY MOUTH AT BEDTIME Patient not taking: Reported on 05/31/2018 02/24/18   Metta Clines  R, DO  methotrexate (RHEUMATREX) 2.5 MG tablet Caution:Chemotherapy. Protect from light. Mondays  Patient takes 5 tablets by mouth on Sundays  And 4 tablets by mouth on Mondays.    [provider]  Multiple Vitamin (MULTIVITAMIN WITH MINERALS) TABS Take 1 tablet by mouth daily.    [provider]  naproxen sodium (ALEVE) 220 MG tablet Take 220 mg by mouth.    [provider]  tizanidine (ZANAFLEX) 2 MG capsule Take 1 capsule (2 mg total) by mouth at bedtime. 03/04/18   Pieter Partridge, DO  Vitamins/Minerals TABS Take by mouth.    [provider]    Inpatient Medications: Scheduled Meds: . [START ON 06/24/2018] aspirin EC  81 mg Oral Daily  . [START ON 04/30/6008] folic acid  2 mg Oral Daily  . [START ON 06/24/2018] pantoprazole  40 mg Oral Daily  . tiZANidine  2 mg Oral QHS   Continuous Infusions: . sodium chloride 100 mL/hr at 06/23/18 2040  . heparin 1,100 Units/hr (06/23/18 1904)   PRN Meds:   Allergies:   No Known Allergies  Social History:   Social History   Socioeconomic History  . Marital status: Married    Spouse name: Katharine Look  . Number of children: 2  . Years of education: Not on file  . Highest education level: Bachelor's degree (e.g., BA, AB, BS)  Occupational History    Employer: [employer]  Social Needs  . Financial resource strain: Not on file  . Food insecurity:    Worry: Not on file    Inability: Not on file  . Transportation needs:    Medical: Not on file    Non-medical: Not on file  Tobacco Use  . Smoking status: Never Smoker  . Smokeless tobacco: Never Used  Substance and Sexual Activity  . Alcohol use: Yes  . Drug use: No  . Sexual activity: Not on file  Lifestyle  . Physical activity:    Days per week: Not on file    Minutes per session: Not on file  . Stress: Not on file  Relationships  . Social connections:    Talks on phone: Not on file    Gets together: Not on file    Attends religious service: Not on file    Active member of club or organization: Not on file    Attends meetings of clubs or organizations: Not on file    Relationship status: Not on file  . Intimate partner violence:    Fear of current or ex partner: Not on file    Emotionally abused: Not on file    Physically abused: Not on file    Forced sexual activity: Not on file  Other Topics Concern  . Not on file  Social History Narrative   Patient is right-handed.  He lives with his wife in a one level home. He does not exercise.     Family History:   Family History  Problem  Relation Age of Onset  . Stroke Mother   . Glaucoma Mother   . Cancer Father   . Prostate cancer Father   . Colon cancer Father    Family Status:  Family Status  Relation Name Status  . Mother  Deceased  . Father  Deceased    ROS:  Please see the history of present illness.  All other ROS reviewed and negative.     Physical Exam/Data:   Vitals:   06/23/18 1927 06/23/18 1928 06/23/18 2000  06/23/18 2038  BP:    125/78  Pulse:      Resp:    16  Temp:    98.2 F (36.8 C)  TempSrc:    Oral  SpO2: 97% 97%  96%  Weight:   81.6 kg   Height:   5\' 9"  (1.753 m)    No intake or output data in the 24 hours ending 06/23/18 2247 Filed Weights   06/23/18 1644 06/23/18 2000  Weight: 87.1 kg 81.6 kg   Body mass index is 26.57 kg/m.  General:  Well nourished, well developed, in no acute distress HEENT: normal, multiple small contusions and abrasions, a small laceration on the bridge of his nose are noted Lymph: no adenopathy Neck: no JVD Endocrine:  No thryomegaly Vascular: No carotid bruits; 4/4 extremity pulses 2+, without bruits  Cardiac:  normal S1, S2; RRR; no murmur  Lungs: Scattered rales to auscultation bilaterally, no wheezing, rhonchi Abd: soft, nontender, no hepatomegaly  Ext: no edema Musculoskeletal:  No deformities, BUE and BLE strength normal and equal Skin: warm and dry  Neuro:  CNs 2-12 intact, no focal abnormalities noted Psych:  Normal affect   EKG:  The EKG was personally reviewed and demonstrates: Sinus rhythm, ventricular bigeminy, heart rate 69 Telemetry:  Telemetry was personally reviewed and demonstrates: Sinus rhythm with frequent PVCs  Relevant CV Studies:  ECHO: 11/05/2015 - Left ventricle: The cavity size was normal. Wall thickness was   normal. Systolic function was normal. The estimated ejection   fraction was in the range of 55% to 60%. Wall motion was normal;   there were no regional wall motion abnormalities. Doppler   parameters are  consistent with abnormal left ventricular   relaxation (grade 1 diastolic dysfunction). - Right atrium: The atrium was mildly dilated.   CAROTID DOPPLER: 08/26/2015  IMPRESSION: Minor carotid atherosclerosis. No hemodynamically significant ICA stenosis. Degree of narrowing less than 50% bilaterally.  Patent antegrade vertebral flow bilaterally.  Laboratory Data:  Chemistry Recent Labs  Lab 06/23/18 1701  NA 140  K 4.2  CL 103  CO2 22  GLUCOSE 135*  BUN 22  CREATININE 1.04  CALCIUM 9.5  GFRNONAA >60  GFRAA >60  ANIONGAP 15    Lab Results  Component Value Date   ALT 16 01/03/2008   AST 23 01/03/2008   ALKPHOS 72 01/03/2008   BILITOT 0.4 01/03/2008   Hematology Recent Labs  Lab 06/23/18 1701  WBC 7.9  RBC 4.00*  HGB 13.4  HCT 41.4  MCV 103.5*  MCH 33.5  MCHC 32.4  RDW 14.6  PLT 158   Cardiac Enzymes Recent Labs  Lab 06/23/18 1701 06/23/18 1932  TROPONINI 0.32* 0.33*   Magnesium:  Magnesium  Date Value Ref Range Status  06/23/2018 2.0 1.7 - 2.4 mg/dL Final    Comment:    Performed at [facility], [place] [address], [place], [place] [address]     Radiology/Studies:  Dg Chest 2 View  Result Date: 06/23/2018 CLINICAL DATA:  Shortness of breath. EXAM: CHEST - 2 VIEW COMPARISON:  04/11/2017. FINDINGS: Borderline cardiomegaly. Pleuroparenchymal scarring bilaterally is stable. No consolidation or edema. No effusion or pneumothorax. Bones unremarkable. IMPRESSION: Stable chest. Electronically Signed   By: Staci Righter M.D.   On: 06/23/2018 20:07   Ct Head Wo Contrast  Result Date: 06/23/2018 CLINICAL DATA:  Vertigo.  Recent fall.  History of BPPV. EXAM: CT HEAD WITHOUT CONTRAST TECHNIQUE: Contiguous axial images were obtained from the base of the  skull through the vertex without intravenous contrast. COMPARISON:  CT head 08/12/2015. FINDINGS: Brain: No evidence for acute infarction, hemorrhage, mass lesion, hydrocephalus, or extra-axial fluid.  Generalized atrophy. Hypoattenuation of white matter likely small vessel disease. In the LEFT parietal parasagittal vertex, there is a destructive lesion of the skull. There is both intracranial and extracranial extension, with a 3 mm thick epidural component seen best on sagittal series 6 image 33. Vascular: Calcification of the cavernous internal carotid arteries consistent with cerebrovascular atherosclerotic disease. No signs of intracranial large vessel occlusion. Skull: Destructive skull lesion, LEFT parasagittal parietal bone, with largest confluent area destruction 15 x 18 x 14 mm. Lesion is contiguous with the superior sagittal sinus but I do not believe that it is venous in origin. A second lesion is seen more caudally in the midline, involving the occipital bone, again with intracranial extension. Subgaleal extension is most prominent at the LEFT parietal region. No fracture related to the recent fall. Sinuses/Orbits: Paranasal sinuses are clear. No orbital findings. Slight frontal soft tissue swelling. Other: Compared with previous CT from 2017, the skull was normal. IMPRESSION: 1. Destructive skull lesion, LEFT parasagittal parietal bone, with intracranial and extracranial extension. Smaller lesion also involving the occipital bone in the midline. The lesions are contiguous with the superior sagittal sinus but I do not favor that it is venous in origin. Concern for osseous spread of unknown malignancy. Aggressive pacchionian granulations cannot completely be excluded, but would appear unlikely given the normal skull just 3 years earlier. 2. Atrophy and small vessel disease. No acute intracranial findings. 3. No skull fracture or intracranial hemorrhage. 4. If further investigation desired, MRI of the brain without and with contrast recommended for further evaluation. Electronically Signed   By: Staci Righter M.D.   On: 06/23/2018 17:43    Assessment and Plan:   1.  Elevated troponin, possible  non-STEMI: His last episode of chest pain was 24 hours ago, not clearly related to the troponin.   -Continue to cycle troponin -Check an echocardiogram -His oxygen saturation keeps falling below 90%, check a two-view chest x-ray and a BNP -Further evaluation and treatment will depend on the results of the above testing  2.  Near syncope/syncope with abnormal head CT: -Internal medicine to admit the patient and evaluate him for this  Principal Problem:   Syncope Active Problems:   Dizziness   BPPV (benign paroxysmal positional vertigo), left   NSTEMI (non-ST elevated myocardial infarction) (HCC)   GERD (gastroesophageal reflux disease)   Generalized OA   HLD (hyperlipidemia)  For questions or updates, please contact [facility] Please consult [email or web address] for contact info under Cardiology/STEMI.   Signed, Ena Dawley, MD  06/23/2018 10:47 PM  The patient was seen, examined and discussed with Rosaria Ferries, PA-C and I agree with the above.   77-yo-male with h/o GERD, Barrett's esophagus, OSA, RA, HLD, chronic, RBBB, who has seen by Dr. Marlou Porch for dizziness, presyncopal episodes- symptoms were positional/postural, also with head position change (carotid Doppler showed no significant disease), symptoms were attributed to a vestibular problem.    He presented with exertional chest pain for approximately 2 weeks, much worse yesterday while he mowed the grass.  It was a tightness, resolved within 30 minutes at rest. Today he felt unusually tired and slept most of the day, then had a syncopal episode falling forward on his face. He was very dizzy afterwards, brought to ER by his daughter later. On physical exam he is comfortable, NAD, S1,2, lungs  CTA, no edema in the lower extremities, good peripheral pulses. Troponin 0.32->0.33, Hb13.4, Crea 1.0. ECG shows SR, RBBB, frequent PVCs in a pattern of bigeminy.  Head CT performed for fall/head injury showed destructive skull lesion,  LEFT parasagittal parietal bone, with intracranial and extracranial extension. Smaller lesion also involving the occipital bone in the midline. The lesions are contiguous with the superior sagittal sinus but I do not favor that it is venous in origin. Concern for osseous spread of unknown malignancy. MRI of the brain without and with contrast recommended for further evaluation.  Plan: NSTEMI Brain mass, probable metastatis of unknown malignancy  Continue iv Heparin, aspirin 81 mg po daily, plan for a left heart cath in the am to rule our significant CAD, however before proceeding with intervention - further evaluation of brain mass, finding primary malignancy, risk of bleeding should be considered.  Ena Dawley, MD 06/23/2018

## 2018-06-23 NOTE — ED Notes (Signed)
ED TO INPATIENT HANDOFF REPORT  ED Nurse Name and Phone #: Gifford Shave [phone]  S Name/Age/Gender [name] 78 y.o. male Room/Bed: 027C/027C  Code Status   Code Status: Prior  Home/SNF/Other Home Patient oriented to: self, place, time and situation Is this baseline? Yes   Triage Complete: Triage complete  Chief Complaint Fall; Vertigo  Triage Note Pt reports getting ready to walk the dog and experiencing vertigo. Falling and hitting himself in the face and head. No chest pain, no LOC, no injuries to the torso, arms, legs or hips.    Allergies No Known Allergies  Level of Care/Admitting Diagnosis ED Disposition    ED Disposition Condition Comment   Admit  Hospital Area: [facility] [[id number]]  Level of Care: Telemetry Cardiac [[id number]]  Covid Evaluation: N/A  Diagnosis: NSTEMI (non-ST elevated myocardial infarction) [facility]) [[id number]]  Admitting Physician: Gerlean Ren [facility] [[id number]]  Attending Physician: Gerlean Ren [facility] [[id number]]  Estimated length of stay: past midnight tomorrow  Certification:: I certify this patient will need inpatient services for at least 2 midnights  PT Class (Do Not Modify): Inpatient [101]  PT Acc Code (Do Not Modify): Private [1]       B Medical/Surgery History Past Medical History:  Diagnosis Date  . Anemia of chronic disease   . Atherosclerosis of both carotid arteries   . Barrett's esophagus   . Dizziness   . GERD (gastroesophageal reflux disease)   . Hx of adenomatous colonic polyps   . Hyperlipidemia   . Macrocytosis without anemia   . Obesity   . OSA (obstructive sleep apnea)   . Rheumatoid arthritis(714.0)   . Syncope    Past Surgical History:  Procedure Laterality Date  . JOINT REPLACEMENT  2004   left     A IV Location/Drains/Wounds Patient Lines/Drains/Airways Status   Active Line/Drains/Airways    Name:   Placement date:   Placement time:   Site:   Days:   Peripheral IV 06/23/18  Right Antecubital   06/23/18    1710    Antecubital   less than 1          Intake/Output Last 24 hours No intake or output data in the 24 hours ending 06/23/18 1922  Labs/Imaging Results for orders placed or performed during the hospital encounter of 06/23/18 (from the past 48 hour(s))  CBC with Differential     Status: Abnormal   Collection Time: 06/23/18  5:01 PM  Result Value Ref Range   WBC 7.9 4.0 - 10.5 K/uL   RBC 4.00 (L) 4.22 - 5.81 MIL/uL   Hemoglobin 13.4 13.0 - 17.0 g/dL   HCT 41.4 39.0 - 52.0 %   MCV 103.5 (H) 80.0 - 100.0 fL   MCH 33.5 26.0 - 34.0 pg   MCHC 32.4 30.0 - 36.0 g/dL   RDW 14.6 11.5 - 15.5 %   Platelets 158 150 - 400 K/uL   nRBC 0.0 0.0 - 0.2 %   Neutrophils Relative % 73 %   Neutro Abs 5.7 1.7 - 7.7 K/uL   Lymphocytes Relative 20 %   Lymphs Abs 1.6 0.7 - 4.0 K/uL   Monocytes Relative 7 %   Monocytes Absolute 0.5 0.1 - 1.0 K/uL   Eosinophils Relative 0 %   Eosinophils Absolute 0.0 0.0 - 0.5 K/uL   Basophils Relative 0 %   Basophils Absolute 0.0 0.0 - 0.1 K/uL   Immature Granulocytes 0 %   Abs Immature Granulocytes 0.03 0.00 -  0.07 K/uL    Comment: Performed at [facility], [place] [address] [place], [place] [address]  Basic metabolic panel     Status: Abnormal   Collection Time: 06/23/18  5:01 PM  Result Value Ref Range   Sodium 140 135 - 145 mmol/L   Potassium 4.2 3.5 - 5.1 mmol/L   Chloride 103 98 - 111 mmol/L   CO2 22 22 - 32 mmol/L   Glucose, Bld 135 (H) 70 - 99 mg/dL   BUN 22 8 - 23 mg/dL   Creatinine, Ser 1.04 0.61 - 1.24 mg/dL   Calcium 9.5 8.9 - 10.3 mg/dL   GFR calc non Af Amer >60 >60 mL/min   GFR calc Af Amer >60 >60 mL/min   Anion gap 15 5 - 15    Comment: Performed at [facility] [address] [place], [place] [address]  Magnesium     Status: None   Collection Time: 06/23/18  5:01 PM  Result Value Ref Range   Magnesium 2.0 1.7 - 2.4 mg/dL    Comment: Performed at [facility], [place] [address],  [place], [place] [address]  Troponin I - ONCE - STAT     Status: Abnormal   Collection Time: 06/23/18  5:01 PM  Result Value Ref Range   Troponin I 0.32 (HH) <0.03 ng/mL    Comment: CRITICAL RESULT CALLED TO, READ BACK BY AND VERIFIED WITH: Sherry Ruffing 1811 06/23/2018 WBOND Performed at [facility], [place] [address], [place], [place] [address]    Ct Head Wo Contrast  Result Date: 06/23/2018 CLINICAL DATA:  Vertigo.  Recent fall.  History of BPPV. EXAM: CT HEAD WITHOUT CONTRAST TECHNIQUE: Contiguous axial images were obtained from the base of the skull through the vertex without intravenous contrast. COMPARISON:  CT head 08/12/2015. FINDINGS: Brain: No evidence for acute infarction, hemorrhage, mass lesion, hydrocephalus, or extra-axial fluid. Generalized atrophy. Hypoattenuation of white matter likely small vessel disease. In the LEFT parietal parasagittal vertex, there is a destructive lesion of the skull. There is both intracranial and extracranial extension, with a 3 mm thick epidural component seen best on sagittal series 6 image 33. Vascular: Calcification of the cavernous internal carotid arteries consistent with cerebrovascular atherosclerotic disease. No signs of intracranial large vessel occlusion. Skull: Destructive skull lesion, LEFT parasagittal parietal bone, with largest confluent area destruction 15 x 18 x 14 mm. Lesion is contiguous with the superior sagittal sinus but I do not believe that it is venous in origin. A second lesion is seen more caudally in the midline, involving the occipital bone, again with intracranial extension. Subgaleal extension is most prominent at the LEFT parietal region. No fracture related to the recent fall. Sinuses/Orbits: Paranasal sinuses are clear. No orbital findings. Slight frontal soft tissue swelling. Other: Compared with previous CT from 2017, the skull was normal. IMPRESSION: 1. Destructive skull lesion, LEFT parasagittal parietal bone, with  intracranial and extracranial extension. Smaller lesion also involving the occipital bone in the midline. The lesions are contiguous with the superior sagittal sinus but I do not favor that it is venous in origin. Concern for osseous spread of unknown malignancy. Aggressive pacchionian granulations cannot completely be excluded, but would appear unlikely given the normal skull just 3 years earlier. 2. Atrophy and small vessel disease. No acute intracranial findings. 3. No skull fracture or intracranial hemorrhage. 4. If further investigation desired, MRI of the brain without and with contrast recommended for further evaluation. Electronically Signed  By: Staci Righter M.D.   On: 06/23/2018 17:43    Pending Labs Unresulted Labs (From admission, onward)    Start     Ordered   06/24/18 0500  Heparin level (unfractionated)  Daily,   R     06/23/18 1827   06/24/18 0500  CBC  Daily,   R     06/23/18 1827          Vitals/Pain Today's Vitals   06/23/18 1644 06/23/18 1650 06/23/18 1700 06/23/18 1911  BP:  (!) 131/93 103/62 114/70  Pulse:  82 80 81  Resp:  18 17 18   Temp:  (!) 97.3 F (36.3 C)    TempSrc:  Axillary    SpO2:  95% 100% 92%  Weight: 87.1 kg     Height: 5\' 9"  (1.753 m)     PainSc:        Isolation Precautions No active isolations  Medications Medications  0.9 %  sodium chloride infusion ( Intravenous New Bag/Given 06/23/18 1715)  heparin ADULT infusion 100 units/mL (25000 units/252mL sodium chloride 0.45%) (1,100 Units/hr Intravenous New Bag/Given 06/23/18 1904)  Tdap (BOOSTRIX) injection 0.5 mL (0.5 mLs Intramuscular Given 06/23/18 1911)  aspirin chewable tablet 324 mg (324 mg Oral Given 06/23/18 1908)  heparin bolus via infusion 4,000 Units (4,000 Units Intravenous Bolus from Bag 06/23/18 1904)    Mobility walks High fall risk   Focused Assessments   R Recommendations: See Admitting Provider Note  Report given to:   Additional Notes:

## 2018-06-23 NOTE — H&P (Signed)
History and Physical    Nicholas Webb UVO:[id number] DOB: 1940-10-16 DOA: 06/23/2018  PCP: Maurice Small, MD Patient coming from: Home  Chief Complaint: Chest pain  HPI: Nicholas Webb is a 78 y.o. male with medical history significant of Barrett's esophagus, GERD, osteoarthritis, rheumatoid arthritis, hyperlipidemia came to the hospital with complains of chest pain.  Patient states he was cutting grass yesterday and as he was doing so he had episodes of substernal squeezing chest pain without any evidence of shortness of breath.  After rest this pain resolved but with activity this would come back on.  No other symptoms with this.  Denies any fevers, chills and other complaints.  Denied diaphoresis as well. This morning when he was walking he felt lightheaded, dizzy with presyncopal episode where he fell down on his knees.  Cannot clearly remember if he had any palpitations or funny feeling in his chest. Recently as outpatient has been evaluated by a neurologist for headaches and has been labeled as tension headache.  Currently he is not having 1.  In the ER his troponins were elevated at 0.3.  EKG did not show any acute ST-T changes but there was concerns of non-STEMI therefore cardiology team was consulted and started on heparin drip.  CT of the head showedAbnormal destructive skull lesion.  He denies having any previous knowledge or diagnosis of this.  He was admitted to the hospital for further care and management.   Review of Systems: As per HPI otherwise 10 point review of systems negative.  Review of Systems Otherwise negative except as per HPI, including: General: Denies fever, chills, night sweats or unintended weight loss. Resp: Denies cough, wheezing, shortness of breath. Cardiac: Denies palpitations, orthopnea, paroxysmal nocturnal dyspnea. GI: Denies abdominal pain, nausea, vomiting, diarrhea or constipation GU: Denies dysuria, frequency, hesitancy or incontinence MS:  Denies muscle aches, joint pain or swelling Neuro: Denies headache, neurologic deficits (focal weakness, numbness, tingling), abnormal gait Psych: Denies anxiety, depression, SI/HI/AVH Skin: Denies new rashes or lesions ID: Denies sick contacts, exotic exposures, travel  Past Medical History:  Diagnosis Date   Anemia of chronic disease    Atherosclerosis of both carotid arteries    Barrett's esophagus    Dizziness    GERD (gastroesophageal reflux disease)    Hx of adenomatous colonic polyps    Hyperlipidemia    Macrocytosis without anemia    Obesity    OSA (obstructive sleep apnea)    Rheumatoid arthritis(714.0)    Syncope     Past Surgical History:  Procedure Laterality Date   JOINT REPLACEMENT  2004   left    SOCIAL HISTORY:  reports that he has never smoked. He has never used smokeless tobacco. He reports current alcohol use. He reports that he does not use drugs.  No Known Allergies  FAMILY HISTORY: Family History  Problem Relation Age of Onset   Stroke Mother    Glaucoma Mother    Cancer Father    Prostate cancer Father    Colon cancer Father      Prior to Admission medications   Medication Sig Start Date End Date Taking? Authorizing Provider  Adalimumab (HUMIRA) 40 MG/0.8ML PSKT Inject 1 each into the skin See admin instructions. Every 2 weeks   Yes [provider]  aspirin EC 81 MG tablet Take 81 mg by mouth daily.   Yes [provider]  esomeprazole (NEXIUM) 20 MG capsule Take 20 mg by mouth daily at 12 noon.    Yes  [provider]  folic acid (FOLVITE) 1 MG tablet Take 2 mg by mouth daily.    Yes [provider]  methotrexate (RHEUMATREX) 2.5 MG tablet Take 30-37.5 mg by mouth See admin instructions. Take 37.5mg  on Sundays and 30mg  on Mondays   Yes [provider]  Multiple Vitamin (MULTIVITAMIN WITH MINERALS) TABS Take 1 tablet by mouth daily.   Yes [provider]  naproxen sodium  (ALEVE) 220 MG tablet Take 220 mg by mouth.   Yes [provider]  tizanidine (ZANAFLEX) 2 MG capsule Take 1 capsule (2 mg total) by mouth at bedtime. 03/04/18  Yes Pieter Partridge, DO    Physical Exam: Vitals:   06/23/18 1927 06/23/18 1928 06/23/18 2000 06/23/18 2038  BP:    125/78  Pulse:      Resp:    16  Temp:    98.2 F (36.8 C)  TempSrc:    Oral  SpO2: 97% 97%  96%  Weight:   (P) 81.6 kg   Height:   (P) 5\' 9"  (1.753 m)       Constitutional: NAD, calm, comfortable Eyes: PERRL, lids and conjunctivae normal ENMT: Mucous membranes are moist. Posterior pharynx clear of any exudate or lesions.Normal dentition.  Neck: normal, supple, no masses, no thyromegaly Respiratory: clear to auscultation bilaterally, no wheezing, no crackles. Normal respiratory effort. No accessory muscle use.  Cardiovascular: Regular rate and rhythm, no murmurs / rubs / gallops. No extremity edema. 2+ pedal pulses. No carotid bruits.  Abdomen: no tenderness, no masses palpated. No hepatosplenomegaly. Bowel sounds positive.  Musculoskeletal: no clubbing / cyanosis. No joint deformity upper and lower extremities. Good ROM, no contractures. Normal muscle tone.  Skin: no rashes, lesions, ulcers. No induration Neurologic: CN 2-12 grossly intact. Sensation intact, DTR normal. Strength 5/5 in all 4.  Psychiatric: Normal judgment and insight. Alert and oriented x 3. Normal mood.     Labs on Admission: I have personally reviewed following labs and imaging studies  CBC: Recent Labs  Lab 06/23/18 1701  WBC 7.9  NEUTROABS 5.7  HGB 13.4  HCT 41.4  MCV 103.5*  PLT 423   Basic Metabolic Panel: Recent Labs  Lab 06/23/18 1701  NA 140  K 4.2  CL 103  CO2 22  GLUCOSE 135*  BUN 22  CREATININE 1.04  CALCIUM 9.5  MG 2.0   GFR: Estimated Creatinine Clearance: 66.1 mL/min (by C-G formula based on SCr of 1.04 mg/dL). Liver Function Tests: No results for input(s): AST, ALT, ALKPHOS, BILITOT, PROT,  ALBUMIN in the last 168 hours. No results for input(s): LIPASE, AMYLASE in the last 168 hours. No results for input(s): AMMONIA in the last 168 hours. Coagulation Profile: No results for input(s): INR, PROTIME in the last 168 hours. Cardiac Enzymes: Recent Labs  Lab 06/23/18 1701 06/23/18 1932  TROPONINI 0.32* 0.33*   BNP (last 3 results) No results for input(s): PROBNP in the last 8760 hours. HbA1C: Recent Labs    06/23/18 1933  HGBA1C 5.9*   CBG: No results for input(s): GLUCAP in the last 168 hours. Lipid Profile: No results for input(s): CHOL, HDL, LDLCALC, TRIG, CHOLHDL, LDLDIRECT in the last 72 hours. Thyroid Function Tests: Recent Labs    06/23/18 1933  TSH 2.365   Anemia Panel: No results for input(s): VITAMINB12, FOLATE, FERRITIN, TIBC, IRON, RETICCTPCT in the last 72 hours. Urine analysis:    Component Value Date/Time   COLORURINE YELLOW 01/03/2008 1019   APPEARANCEUR CLEAR 01/03/2008 1019  LABSPEC 1.016 01/03/2008 1019   PHURINE 6.5 01/03/2008 1019   GLUCOSEU NEGATIVE 01/03/2008 1019   HGBUR NEGATIVE 01/03/2008 1019   BILIRUBINUR NEGATIVE 01/03/2008 1019   KETONESUR NEGATIVE 01/03/2008 1019   PROTEINUR NEGATIVE 01/03/2008 1019   UROBILINOGEN 0.2 01/03/2008 1019   NITRITE NEGATIVE 01/03/2008 1019   LEUKOCYTESUR  01/03/2008 1019    NEGATIVE MICROSCOPIC NOT DONE ON URINES WITH NEGATIVE PROTEIN, BLOOD, LEUKOCYTES, NITRITE, OR GLUCOSE <1000 mg/dL.   Sepsis Labs: !!!!!!!!!!!!!!!!!!!!!!!!!!!!!!!!!!!!!!!!!!!! @LABRCNTIP (procalcitonin:4,lacticidven:4) )No results found for this or any previous visit (from the past 240 hour(s)).   Radiological Exams on Admission: Dg Chest 2 View  Result Date: 06/23/2018 CLINICAL DATA:  Shortness of breath. EXAM: CHEST - 2 VIEW COMPARISON:  04/11/2017. FINDINGS: Borderline cardiomegaly. Pleuroparenchymal scarring bilaterally is stable. No consolidation or edema. No effusion or pneumothorax. Bones unremarkable. IMPRESSION:  Stable chest. Electronically Signed   By: Staci Righter M.D.   On: 06/23/2018 20:07   Ct Head Wo Contrast  Result Date: 06/23/2018 CLINICAL DATA:  Vertigo.  Recent fall.  History of BPPV. EXAM: CT HEAD WITHOUT CONTRAST TECHNIQUE: Contiguous axial images were obtained from the base of the skull through the vertex without intravenous contrast. COMPARISON:  CT head 08/12/2015. FINDINGS: Brain: No evidence for acute infarction, hemorrhage, mass lesion, hydrocephalus, or extra-axial fluid. Generalized atrophy. Hypoattenuation of white matter likely small vessel disease. In the LEFT parietal parasagittal vertex, there is a destructive lesion of the skull. There is both intracranial and extracranial extension, with a 3 mm thick epidural component seen best on sagittal series 6 image 33. Vascular: Calcification of the cavernous internal carotid arteries consistent with cerebrovascular atherosclerotic disease. No signs of intracranial large vessel occlusion. Skull: Destructive skull lesion, LEFT parasagittal parietal bone, with largest confluent area destruction 15 x 18 x 14 mm. Lesion is contiguous with the superior sagittal sinus but I do not believe that it is venous in origin. A second lesion is seen more caudally in the midline, involving the occipital bone, again with intracranial extension. Subgaleal extension is most prominent at the LEFT parietal region. No fracture related to the recent fall. Sinuses/Orbits: Paranasal sinuses are clear. No orbital findings. Slight frontal soft tissue swelling. Other: Compared with previous CT from 2017, the skull was normal. IMPRESSION: 1. Destructive skull lesion, LEFT parasagittal parietal bone, with intracranial and extracranial extension. Smaller lesion also involving the occipital bone in the midline. The lesions are contiguous with the superior sagittal sinus but I do not favor that it is venous in origin. Concern for osseous spread of unknown malignancy. Aggressive  pacchionian granulations cannot completely be excluded, but would appear unlikely given the normal skull just 3 years earlier. 2. Atrophy and small vessel disease. No acute intracranial findings. 3. No skull fracture or intracranial hemorrhage. 4. If further investigation desired, MRI of the brain without and with contrast recommended for further evaluation. Electronically Signed   By: Staci Righter M.D.   On: 06/23/2018 17:43     All images have been reviewed by me personally.  EKG: Independently reviewed.  No acute ST elevations otherwise sinus rhythm  Assessment/Plan Principal Problem:   Syncope Active Problems:   Dizziness   BPPV (benign paroxysmal positional vertigo), left   NSTEMI (non-ST elevated myocardial infarction) (HCC)   GERD (gastroesophageal reflux disease)   Generalized OA   HLD (hyperlipidemia)   Chest pain with elevated troponin - Concerns for acute coronary syndrome.  Troponins 0.3.  We will continue to trend this.  EKG does not show  any acute ST-T elevations. - Check 2D echocardiogram. - Possibly could benefit from stress test at the least but will defer to cardiology.  Presyncope with chronic headache -CT of the head shows destructive bony lesion in the parietal bone.  We will get MRI of the brain to get better definition of this.  Denies any personal or family history of malignancies.  History of rheumatoid arthritis -On Humira and methotrexate.  We will hold this for now but should be able to resume if necessary.  On folic acid  GERD with Barrett's esophagus -PPI daily   DVT prophylaxis: Heparin drip Code Status: Full code Family Communication: None at bedside Disposition Plan: To be determined Consults called: Cardiology Admission status: Inpatient admission to cardiac telemetry floor   Time Spent: 65 minutes.  >50% of the time was devoted to discussing the patients care, assessment, plan and disposition with other care givers along with counseling  the patient about the risks and benefits of treatment.    Kourtni Stineman Arsenio Loader MD [facility]  If 7PM-7AM, please contact night-coverage [email or web address]  06/23/2018, 9:03 PM

## 2018-06-23 NOTE — ED Notes (Signed)
Pt report attempted 3x times. Report given at 2000 to floor nurse. Pt went to xray and returned. Admitting provider currently at bedside. Will transfer when admitting provider leaves room.

## 2018-06-23 NOTE — ED Triage Notes (Signed)
Pt reports getting ready to walk the dog and experiencing vertigo. Falling and hitting himself in the face and head. No chest pain, no LOC, no injuries to the torso, arms, legs or hips.

## 2018-06-23 NOTE — ED Provider Notes (Signed)
[facility] EMERGENCY DEPARTMENT Provider Note   CSN: [id number] Arrival date & time: 06/23/18  1627    History   Chief Complaint Chief Complaint  Patient presents with  . Dizziness  . Fall    injury to the face    HPI Nicholas Webb is a 78 y.o. male.     HPI   78 year old male presenting after a fall.  Happened shortly before arrival.  Patient was getting ready to walk his dog and felt lightheaded.  Felt as if he may pass out.  He fell down onto his knees and then struck his face/head against the ground.  He did not completely lose consciousness though.  He has some facial contusion/abrasion from his glasses.  He denies any other associated injuries. Does have some neck pain but says this is chronic/unchanged.  When questioning about how he has otherwise been feeling recently, he reports that he has been having exertional chest tightness and generalized weakness such as when doing yard work.  His symptoms will improve with rest.  He is currently not having any chest pain.  Denies any known cardiac disease.  Past Medical History:  Diagnosis Date  . Anemia of chronic disease   . Atherosclerosis of both carotid arteries   . Barrett's esophagus   . Dizziness   . GERD (gastroesophageal reflux disease)   . Hx of adenomatous colonic polyps   . Hyperlipidemia   . Macrocytosis without anemia   . Obesity   . OSA (obstructive sleep apnea)   . Rheumatoid arthritis(714.0)   . Syncope     Patient Active Problem List   Diagnosis Date Noted  . BPPV (benign paroxysmal positional vertigo), left 12/20/2015  . Syncope   . Dizziness     Past Surgical History:  Procedure Laterality Date  . JOINT REPLACEMENT  2004   left        Home Medications    Prior to Admission medications   Medication Sig Start Date End Date Taking? Authorizing Provider  Adalimumab (HUMIRA) 40 MG/0.8ML PSKT TAKE SUBCUTANEOUS EVERY TWO WEEKS    [provider]   esomeprazole (NEXIUM) 20 MG capsule Take 40 mg by mouth daily at 12 noon.    [provider]  folic acid (FOLVITE) 1 MG tablet Take 1 mg by mouth daily.    [provider]  gabapentin (NEURONTIN) 100 MG capsule TAKE 2 CAPSULES BY MOUTH AT BEDTIME Patient not taking: Reported on 05/31/2018 02/24/18   Pieter Partridge, DO  methotrexate (RHEUMATREX) 2.5 MG tablet Caution:Chemotherapy. Protect from light. Mondays  Patient takes 5 tablets by mouth on Sundays  And 4 tablets by mouth on Mondays.    [provider]  Multiple Vitamin (MULTIVITAMIN WITH MINERALS) TABS Take 1 tablet by mouth daily.    [provider]  naproxen sodium (ALEVE) 220 MG tablet Take 220 mg by mouth.    [provider]  tizanidine (ZANAFLEX) 2 MG capsule Take 1 capsule (2 mg total) by mouth at bedtime. 03/04/18   Pieter Partridge, DO  Vitamins/Minerals TABS Take by mouth.    [provider]    Family History Family History  Problem Relation Age of Onset  . Stroke Mother   . Glaucoma Mother   . Cancer Father   . Prostate cancer Father   . Colon cancer Father     Social History Social History   Tobacco Use  . Smoking status: Never Smoker  . Smokeless tobacco: Never  Used  Substance Use Topics  . Alcohol use: Yes  . Drug use: No     Allergies   Patient has no known allergies.   Review of Systems Review of Systems  All systems reviewed and negative, other than as noted in HPI.  Physical Exam Updated Vital Signs BP 103/62   Pulse 80   Temp (!) 97.3 F (36.3 C) (Axillary)   Resp 17   Ht 5\' 9"  (1.753 m)   Wt 87.1 kg   SpO2 100%   BMI 28.35 kg/m   Physical Exam Vitals signs and nursing note reviewed.  Constitutional:      General: He is not in acute distress.    Appearance: He is well-developed.  HENT:     Head: Normocephalic.     Comments: Abrasion across bridge of nose and mild ecchymosis to forehead.  Eyes:     General:        Right eye: No  discharge.        Left eye: No discharge.     Conjunctiva/sclera: Conjunctivae normal.  Neck:     Musculoskeletal: Neck supple.  Cardiovascular:     Rate and Rhythm: Normal rate.     Heart sounds: Normal heart sounds. No murmur. No friction rub. No gallop.      Comments: Mostly regular but ectopy noted Pulmonary:     Effort: Pulmonary effort is normal. No respiratory distress.     Breath sounds: Normal breath sounds.  Abdominal:     General: There is no distension.     Palpations: Abdomen is soft.     Tenderness: There is no abdominal tenderness.  Musculoskeletal:        General: No tenderness.  Skin:    General: Skin is warm and dry.  Neurological:     Mental Status: He is alert.  Psychiatric:        Behavior: Behavior normal.        Thought Content: Thought content normal.      ED Treatments / Results  Labs (all labs ordered are listed, but only abnormal results are displayed) Labs Reviewed  CBC WITH DIFFERENTIAL/PLATELET - Abnormal; Notable for the following components:      Result Value   RBC 4.00 (*)    MCV 103.5 (*)    All other components within normal limits  BASIC METABOLIC PANEL - Abnormal; Notable for the following components:   Glucose, Bld 135 (*)    All other components within normal limits  TROPONIN I - Abnormal; Notable for the following components:   Troponin I 0.32 (*)    All other components within normal limits  MAGNESIUM  HEPARIN LEVEL (UNFRACTIONATED)  CBC    EKG EKG Interpretation  Date/Time:  Thursday June 23 2018 16:44:18 EDT Ventricular Rate:  69 PR Interval:    QRS Duration: 140 QT Interval:  403 QTC Calculation: 432 R Axis:   36 Text Interpretation:  Sinus rhythm Ventricular bigeminy Right bundle branch block Confirmed by Virgel Manifold [id number]) on 06/23/2018 4:59:43 PM   Radiology Ct Head Wo Contrast  Result Date: 06/23/2018 CLINICAL DATA:  Vertigo.  Recent fall.  History of BPPV. EXAM: CT HEAD WITHOUT CONTRAST TECHNIQUE:  Contiguous axial images were obtained from the base of the skull through the vertex without intravenous contrast. COMPARISON:  CT head 08/12/2015. FINDINGS: Brain: No evidence for acute infarction, hemorrhage, mass lesion, hydrocephalus, or extra-axial fluid. Generalized atrophy. Hypoattenuation of white matter likely small vessel disease. In the LEFT parietal parasagittal  vertex, there is a destructive lesion of the skull. There is both intracranial and extracranial extension, with a 3 mm thick epidural component seen best on sagittal series 6 image 33. Vascular: Calcification of the cavernous internal carotid arteries consistent with cerebrovascular atherosclerotic disease. No signs of intracranial large vessel occlusion. Skull: Destructive skull lesion, LEFT parasagittal parietal bone, with largest confluent area destruction 15 x 18 x 14 mm. Lesion is contiguous with the superior sagittal sinus but I do not believe that it is venous in origin. A second lesion is seen more caudally in the midline, involving the occipital bone, again with intracranial extension. Subgaleal extension is most prominent at the LEFT parietal region. No fracture related to the recent fall. Sinuses/Orbits: Paranasal sinuses are clear. No orbital findings. Slight frontal soft tissue swelling. Other: Compared with previous CT from 2017, the skull was normal. IMPRESSION: 1. Destructive skull lesion, LEFT parasagittal parietal bone, with intracranial and extracranial extension. Smaller lesion also involving the occipital bone in the midline. The lesions are contiguous with the superior sagittal sinus but I do not favor that it is venous in origin. Concern for osseous spread of unknown malignancy. Aggressive pacchionian granulations cannot completely be excluded, but would appear unlikely given the normal skull just 3 years earlier. 2. Atrophy and small vessel disease. No acute intracranial findings. 3. No skull fracture or intracranial  hemorrhage. 4. If further investigation desired, MRI of the brain without and with contrast recommended for further evaluation. Electronically Signed   By: Staci Righter M.D.   On: 06/23/2018 17:43    Procedures Procedures (including critical care time)  CRITICAL CARE Performed by: Virgel Manifold Total critical care time: 35 minutes Critical care time was exclusive of separately billable procedures and treating other patients. Critical care was necessary to treat or prevent imminent or life-threatening deterioration. Critical care was time spent personally by me on the following activities: development of treatment plan with patient and/or surrogate as well as nursing, discussions with consultants, evaluation of patient's response to treatment, examination of patient, obtaining history from patient or surrogate, ordering and performing treatments and interventions, ordering and review of laboratory studies, ordering and review of radiographic studies, pulse oximetry and re-evaluation of patient's condition.  Medications Ordered in ED Medications  Tdap (BOOSTRIX) injection 0.5 mL (has no administration in time range)  0.9 %  sodium chloride infusion ( Intravenous New Bag/Given 06/23/18 1715)     Initial Impression / Assessment and Plan / ED Course  I have reviewed the triage vital signs and the nursing notes.  Pertinent labs & imaging results that were available during my care of the patient were reviewed by me and considered in my medical decision making (see chart for details).        77yM with dizziness and fall. Describes what sounds like near syncope. Has a history of vertigo but says episodes today felt different. He relates that he has had at least a several day history of exertional chest tightness and feeling of generalized weakness which will improve with rest. His initial troponin was elevated at 0.32. He is currently asymptomatic. ASA/heparin ordered. Cardiology consulted.  No  significant traumatic injury on head CT, but destructive osseous skull lesions noted. New from head CT done in 2017. No known hx of malignancy. He has been having headaches for months and this may be why.  Will discuss with medicine for admission.   Pt and his daughter, who is a Designer, jewellery with [employer], updated.   Final  Clinical Impressions(s) / ED Diagnoses   Final diagnoses:  Unstable angina [facility])  Mass of parietal bone of skull    ED Discharge Orders    None       Virgel Manifold, MD 06/24/18 2334

## 2018-06-23 NOTE — ED Notes (Signed)
Patient transported to CT 

## 2018-06-23 NOTE — ED Notes (Signed)
Pt wavering between mid 90's and high 80's for o2 sats. Placed on 2L o2. Sats now in the upper 90's.

## 2018-06-23 NOTE — Progress Notes (Signed)
ANTICOAGULATION CONSULT NOTE - Initial Consult  Pharmacy Consult for heparin Indication: chest pain/ACS  No Known Allergies  Patient Measurements: Height: 5\' 9"  (175.3 cm) Weight: 192 lb (87.1 kg) IBW/kg (Calculated) : 70.7 Heparin Dosing Weight: 87kg  Vital Signs: Temp: 97.3 F (36.3 C) (04/23 1650) Temp Source: Axillary (04/23 1650) BP: 103/62 (04/23 1700) Pulse Rate: 80 (04/23 1700)  Labs: Recent Labs    06/23/18 1701  HGB 13.4  HCT 41.4  PLT 158  CREATININE 1.04  TROPONINI 0.32*    Estimated Creatinine Clearance: 66.1 mL/min (by C-G formula based on SCr of 1.04 mg/dL).   Medical History: Past Medical History:  Diagnosis Date  . Anemia of chronic disease   . Atherosclerosis of both carotid arteries   . Barrett's esophagus   . Dizziness   . GERD (gastroesophageal reflux disease)   . Hx of adenomatous colonic polyps   . Hyperlipidemia   . Macrocytosis without anemia   . Obesity   . OSA (obstructive sleep apnea)   . Rheumatoid arthritis(714.0)   . Syncope    Assessment: 61 YOM presenting with vertigo, troponin 0.32 and pharmacy consulted to start heparin.  No anticoagulation PTA, CBC wnl.    Goal of Therapy:  Heparin level 0.3-0.7 units/ml Monitor platelets by anticoagulation protocol: Yes   Plan:  Heparin 4000 units IV x1, and gtt at 1100 units/hr F/u 8 hour heparin level  Bertis Ruddy, PharmD Clinical Pharmacist Please check AMION for all [facility] numbers 06/23/2018 6:26 PM

## 2018-06-24 ENCOUNTER — Encounter (HOSPITAL_COMMUNITY)
Admission: EM | Disposition: A | Discharge status: Home / Self Care | Payer: Self-pay | Source: Home / Self Care | Attending: Internal Medicine

## 2018-06-24 ENCOUNTER — Inpatient Hospital Stay (HOSPITAL_COMMUNITY): Payer: Medicare Other

## 2018-06-24 DIAGNOSIS — I361 Nonrheumatic tricuspid (valve) insufficiency: Secondary | ICD-10-CM

## 2018-06-24 DIAGNOSIS — M898X8 Other specified disorders of bone, other site: Secondary | ICD-10-CM

## 2018-06-24 DIAGNOSIS — I200 Unstable angina: Secondary | ICD-10-CM

## 2018-06-24 DIAGNOSIS — I2510 Atherosclerotic heart disease of native coronary artery without angina pectoris: Secondary | ICD-10-CM

## 2018-06-24 HISTORY — PX: CORONARY STENT INTERVENTION: CATH118234

## 2018-06-24 HISTORY — PX: LEFT HEART CATH AND CORONARY ANGIOGRAPHY: CATH118249

## 2018-06-24 LAB — ECHOCARDIOGRAM COMPLETE
Height: 69 in
Weight: 2878.4 oz

## 2018-06-24 LAB — CBC
HCT: 34.8 % — ABNORMAL LOW (ref 39.0–52.0)
Hemoglobin: 11.6 g/dL — ABNORMAL LOW (ref 13.0–17.0)
MCH: 34 pg (ref 26.0–34.0)
MCHC: 33.3 g/dL (ref 30.0–36.0)
MCV: 102.1 fL — ABNORMAL HIGH (ref 80.0–100.0)
Platelets: 150 10*3/uL (ref 150–400)
RBC: 3.41 MIL/uL — ABNORMAL LOW (ref 4.22–5.81)
RDW: 14.5 % (ref 11.5–15.5)
WBC: 10.1 10*3/uL (ref 4.0–10.5)
nRBC: 0 % (ref 0.0–0.2)

## 2018-06-24 LAB — COMPREHENSIVE METABOLIC PANEL
ALT: 49 U/L — ABNORMAL HIGH (ref 0–44)
AST: 40 U/L (ref 15–41)
Albumin: 3.2 g/dL — ABNORMAL LOW (ref 3.5–5.0)
Alkaline Phosphatase: 67 U/L (ref 38–126)
Anion gap: 10 (ref 5–15)
BUN: 25 mg/dL — ABNORMAL HIGH (ref 8–23)
CO2: 23 mmol/L (ref 22–32)
Calcium: 8.9 mg/dL (ref 8.9–10.3)
Chloride: 107 mmol/L (ref 98–111)
Creatinine, Ser: 1 mg/dL (ref 0.61–1.24)
GFR calc Af Amer: >60 mL/min (ref >60)
GFR calc non Af Amer: >60 mL/min (ref >60)
Glucose, Bld: 111 mg/dL — ABNORMAL HIGH (ref 70–99)
Potassium: 4 mmol/L (ref 3.5–5.1)
Sodium: 140 mmol/L (ref 135–145)
Total Bilirubin: 0.8 mg/dL (ref 0.3–1.2)
Total Protein: 6.4 g/dL — ABNORMAL LOW (ref 6.5–8.1)

## 2018-06-24 LAB — PSA: Prostatic Specific Antigen: 0.87 ng/mL (ref 0.00–4.00)

## 2018-06-24 LAB — POCT ACTIVATED CLOTTING TIME
Activated Clotting Time: 296 seconds
Activated Clotting Time: 362 seconds

## 2018-06-24 LAB — TROPONIN I
Troponin I: 0.25 ng/mL (ref <0.03)
Troponin I: 0.17 ng/mL (ref <0.03)

## 2018-06-24 LAB — HEPARIN LEVEL (UNFRACTIONATED)
Heparin Unfractionated: 0.48 IU/mL (ref 0.30–0.70)
Heparin Unfractionated: 0.42 IU/mL (ref 0.30–0.70)

## 2018-06-24 LAB — MAGNESIUM: Magnesium: 1.9 mg/dL (ref 1.7–2.4)

## 2018-06-24 SURGERY — LEFT HEART CATH AND CORONARY ANGIOGRAPHY
Anesthesia: LOCAL

## 2018-06-24 MED ORDER — SODIUM CHLORIDE 0.9% FLUSH
3.0000 mL | Freq: Two times a day (BID) | INTRAVENOUS | Status: DC
  Administered 2018-06-26 – 2018-06-28 (×2): 3 mL via INTRAVENOUS

## 2018-06-24 MED ORDER — CHLORHEXIDINE GLUCONATE 0.12 % MT SOLN
15.0000 mL | Freq: Two times a day (BID) | OROMUCOSAL | Status: DC
  Administered 2018-06-24 – 2018-06-28 (×9): 15 mL via OROMUCOSAL
  Filled 2018-06-24 (×8): qty 15

## 2018-06-24 MED ORDER — ASPIRIN 81 MG PO CHEW
81.0000 mg | CHEWABLE_TABLET | ORAL | Status: AC
  Administered 2018-06-24: 81 mg via ORAL
  Filled 2018-06-24: qty 1

## 2018-06-24 MED ORDER — SODIUM CHLORIDE 0.9 % IV SOLN: 250.0000 mL | INTRAVENOUS | Status: DC | PRN

## 2018-06-24 MED ORDER — SODIUM CHLORIDE 0.9% FLUSH: 3.0000 mL | INTRAVENOUS | Status: DC | PRN

## 2018-06-24 MED ORDER — ORAL CARE MOUTH RINSE
15.0000 mL | Freq: Two times a day (BID) | OROMUCOSAL | Status: DC
  Administered 2018-06-27: 15 mL via OROMUCOSAL

## 2018-06-24 MED ORDER — IOHEXOL 350 MG/ML SOLN
INTRAVENOUS | Status: DC | PRN
  Administered 2018-06-24: 120 mL via INTRA_ARTERIAL

## 2018-06-24 MED ORDER — HEPARIN SODIUM (PORCINE) 1000 UNIT/ML IJ SOLN
INTRAMUSCULAR | Status: DC | PRN
  Administered 2018-06-24: 3000 [IU] via INTRAVENOUS
  Administered 2018-06-24: 6000 [IU] via INTRAVENOUS
  Administered 2018-06-24: 4000 [IU] via INTRAVENOUS

## 2018-06-24 MED ORDER — ATORVASTATIN CALCIUM 80 MG PO TABS
80.0000 mg | ORAL_TABLET | Freq: Every day | ORAL | Status: DC
  Administered 2018-06-24 – 2018-06-27 (×4): 80 mg via ORAL
  Filled 2018-06-24 (×4): qty 1

## 2018-06-24 MED ORDER — HEPARIN (PORCINE) IN NACL 1000-0.9 UT/500ML-% IV SOLN
INTRAVENOUS | Status: AC
  Filled 2018-06-24: qty 1000

## 2018-06-24 MED ORDER — LIDOCAINE HCL (PF) 1 % IJ SOLN
INTRAMUSCULAR | Status: DC | PRN
  Administered 2018-06-24: 2 mL

## 2018-06-24 MED ORDER — SODIUM CHLORIDE 0.9 % WEIGHT BASED INFUSION: 3.0000 mL/kg/h | INTRAVENOUS | Status: DC

## 2018-06-24 MED ORDER — MIDAZOLAM HCL 2 MG/2ML IJ SOLN
INTRAMUSCULAR | Status: AC
  Filled 2018-06-24: qty 2

## 2018-06-24 MED ORDER — MIDAZOLAM HCL 2 MG/2ML IJ SOLN
INTRAMUSCULAR | Status: DC | PRN
  Administered 2018-06-24: 1 mg via INTRAVENOUS

## 2018-06-24 MED ORDER — FENTANYL CITRATE (PF) 100 MCG/2ML IJ SOLN
INTRAMUSCULAR | Status: DC | PRN
  Administered 2018-06-24: 25 ug via INTRAVENOUS

## 2018-06-24 MED ORDER — ASPIRIN EC 81 MG PO TBEC
81.0000 mg | DELAYED_RELEASE_TABLET | Freq: Every day | ORAL | Status: DC
  Administered 2018-06-25 – 2018-06-28 (×4): 81 mg via ORAL
  Filled 2018-06-24 (×4): qty 1

## 2018-06-24 MED ORDER — SODIUM CHLORIDE 0.9 % WEIGHT BASED INFUSION: 1.0000 mL/kg/h | INTRAVENOUS | Status: DC

## 2018-06-24 MED ORDER — SODIUM CHLORIDE 0.9 % IV SOLN
INTRAVENOUS | Status: AC
  Administered 2018-06-24: 18:00:00 via INTRAVENOUS

## 2018-06-24 MED ORDER — HEPARIN (PORCINE) IN NACL 1000-0.9 UT/500ML-% IV SOLN
INTRAVENOUS | Status: DC | PRN
  Administered 2018-06-24: 500 mL

## 2018-06-24 MED ORDER — SODIUM CHLORIDE 0.9% FLUSH
3.0000 mL | INTRAVENOUS | Status: DC | PRN
  Administered 2018-06-25: 3 mL via INTRAVENOUS
  Filled 2018-06-24: qty 3

## 2018-06-24 MED ORDER — GADOBUTROL 1 MMOL/ML IV SOLN
8.0000 mL | Freq: Once | INTRAVENOUS | Status: AC | PRN
  Administered 2018-06-24: 8 mL via INTRAVENOUS

## 2018-06-24 MED ORDER — HEPARIN SODIUM (PORCINE) 1000 UNIT/ML IJ SOLN
INTRAMUSCULAR | Status: AC
  Filled 2018-06-24: qty 1

## 2018-06-24 MED ORDER — TICAGRELOR 90 MG PO TABS
ORAL_TABLET | ORAL | Status: AC
  Filled 2018-06-24: qty 2

## 2018-06-24 MED ORDER — NITROGLYCERIN 1 MG/10 ML FOR IR/CATH LAB
INTRA_ARTERIAL | Status: AC
  Filled 2018-06-24: qty 10

## 2018-06-24 MED ORDER — VERAPAMIL HCL 2.5 MG/ML IV SOLN
INTRAVENOUS | Status: AC
  Filled 2018-06-24: qty 2

## 2018-06-24 MED ORDER — VERAPAMIL HCL 2.5 MG/ML IV SOLN
INTRAVENOUS | Status: DC | PRN
  Administered 2018-06-24: 10 mL via INTRA_ARTERIAL

## 2018-06-24 MED ORDER — FENTANYL CITRATE (PF) 100 MCG/2ML IJ SOLN
INTRAMUSCULAR | Status: AC
  Filled 2018-06-24: qty 2

## 2018-06-24 MED ORDER — SODIUM CHLORIDE 0.9% FLUSH: 3.0000 mL | Freq: Two times a day (BID) | INTRAVENOUS | Status: DC

## 2018-06-24 MED ORDER — HYDRALAZINE HCL 20 MG/ML IJ SOLN: 10.0000 mg | INTRAMUSCULAR | Status: AC | PRN

## 2018-06-24 MED ORDER — LABETALOL HCL 5 MG/ML IV SOLN: 10.0000 mg | INTRAVENOUS | Status: AC | PRN

## 2018-06-24 MED ORDER — LIDOCAINE HCL (PF) 1 % IJ SOLN
INTRAMUSCULAR | Status: AC
  Filled 2018-06-24: qty 30

## 2018-06-24 MED ORDER — TICAGRELOR 90 MG PO TABS
ORAL_TABLET | ORAL | Status: DC | PRN
  Administered 2018-06-24: 180 mg via ORAL

## 2018-06-24 MED ORDER — TICAGRELOR 90 MG PO TABS
90.0000 mg | ORAL_TABLET | Freq: Two times a day (BID) | ORAL | Status: DC
  Administered 2018-06-24 – 2018-06-28 (×8): 90 mg via ORAL
  Filled 2018-06-24 (×8): qty 1

## 2018-06-24 SURGICAL SUPPLY — 16 items
BALLN SAPPHIRE 2.0X12 (BALLOONS) ×2
BALLN SAPPHIRE ~~LOC~~ 3.25X12 (BALLOONS) ×1 IMPLANT
BALLOON SAPPHIRE 2.0X12 (BALLOONS) IMPLANT
CATH 5FR JL3.5 JR4 ANG PIG MP (CATHETERS) ×1 IMPLANT
CATH VISTA GUIDE 6FR XBLAD3.5 (CATHETERS) ×1 IMPLANT
DEVICE RAD COMP TR BAND LRG (VASCULAR PRODUCTS) ×1 IMPLANT
GLIDESHEATH SLEND SS 6F .021 (SHEATH) ×1 IMPLANT
GUIDEWIRE INQWIRE 1.5J.035X260 (WIRE) IMPLANT
INQWIRE 1.5J .035X260CM (WIRE) ×2
KIT ENCORE 26 ADVANTAGE (KITS) ×1 IMPLANT
KIT HEART LEFT (KITS) ×2 IMPLANT
PACK CARDIAC CATHETERIZATION (CUSTOM PROCEDURE TRAY) ×2 IMPLANT
STENT RESOLUTE ONYX 3.0X18 (Permanent Stent) ×1 IMPLANT
TRANSDUCER W/STOPCOCK (MISCELLANEOUS) ×2 IMPLANT
TUBING CIL FLEX 10 FLL-RA (TUBING) ×2 IMPLANT
WIRE COUGAR XT STRL 190CM (WIRE) ×1 IMPLANT

## 2018-06-24 NOTE — Progress Notes (Signed)
ANTICOAGULATION CONSULT NOTE  Pharmacy Consult for heparin Indication: chest pain/ACS  No Known Allergies  Patient Measurements: Height: 5\' 9"  (175.3 cm) Weight: 179 lb 14.4 oz (81.6 kg) IBW/kg (Calculated) : 70.7 Heparin Dosing Weight: 87kg  Vital Signs: Temp: 98.2 F (36.8 C) (04/23 2038) Temp Source: Oral (04/23 2038) BP: 111/72 (04/23 2109) Pulse Rate: 40 (04/23 1930)  Labs: Recent Labs    06/23/18 1701 06/23/18 1932 06/24/18 0234  HGB 13.4  --  11.6*  HCT 41.4  --  34.8*  PLT 158  --  150  HEPARINUNFRC  --   --  0.48  CREATININE 1.04  --   --   TROPONINI 0.32* 0.33*  --     Estimated Creatinine Clearance: 60.4 mL/min (by C-G formula based on SCr of 1.04 mg/dL).  Medical History: Past Medical History:  Diagnosis Date  . Anemia of chronic disease   . Atherosclerosis of both carotid arteries   . Barrett's esophagus   . Dizziness   . GERD (gastroesophageal reflux disease)   . Hx of adenomatous colonic polyps   . Hyperlipidemia   . Macrocytosis without anemia   . Obesity   . OSA (obstructive sleep apnea)   . Rheumatoid arthritis(714.0)   . Syncope    Assessment: 75 YOM presenting with NSTEMI. Pharmacy consulted for heparin dosing pending cath on 4/24.  No anticoagulation PTA. Heparin level therapeutic at 0.48. Hgb decreased from 13.4 to 11.6 today. Pltc stable at 150. No bleeding noted per RN.  Goal of Therapy:  Heparin level 0.3-0.7 units/ml Monitor platelets by anticoagulation protocol: Yes   Plan:  Continue IV heparin at 1100 units/hr Check 8 hour confirmatory heparin level Monitor for signs/symptoms of bleeding F/u plans post cath  Vertis Kelch, PharmD PGY1 Pharmacy Resident Phone [phone] 06/24/2018       3:45 AM

## 2018-06-24 NOTE — Progress Notes (Deleted)
RT called regarding cpap, RT was in a rapid on 3E. RT checked on pt, pt resting comfortably at this time and will be placed on CPAP tonight. RN aware.

## 2018-06-24 NOTE — Progress Notes (Signed)
Progress Note  Patient Name: Nicholas Webb Date of Encounter: 06/24/2018  Primary Cardiologist: Candee Furbish, MD   Subjective   78 year old gentleman with a history of gastroesophageal reflux, hyperlipidemia, minimal carotid artery disease who is seen yesterday by Dr. Meda Coffee for further evaluation of an elevated troponin level.  Patient describes exertional chest pain for the past several weeks.  He noticed it initially while mowing the grass. Yesterday the patient had an episode of syncope.  It was preceded by some dizziness for a few seconds and then he completely lost consciousness.  He is scheduled for heart catheterization today given his episodes of unstable angina and the episode of syncope.  CT of the head revealed no lesions in the brain.  He was found to have some skull lesions which are likely metastatic disease.  There is no PSA on file in our chart.  Inpatient Medications    Scheduled Meds:  aspirin EC  81 mg Oral Daily   chlorhexidine  15 mL Mouth Rinse BID   folic acid  2 mg Oral Daily   mouth rinse  15 mL Mouth Rinse q12n4p   pantoprazole  40 mg Oral Daily   tiZANidine  2 mg Oral QHS   Continuous Infusions:  sodium chloride 100 mL/hr at 06/24/18 3818   heparin 1,100 Units/hr (06/23/18 1904)   PRN Meds: acetaminophen **OR** acetaminophen, bisacodyl, hydrALAZINE, HYDROcodone-acetaminophen, ondansetron **OR** ondansetron (ZOFRAN) IV, senna-docusate, sodium phosphate   Vital Signs    Vitals:   06/23/18 2000 06/23/18 2038 06/23/18 2109 06/24/18 0402  BP:  125/78 111/72 (!) 103/59  Pulse:      Resp:  16  16  Temp:  98.2 F (36.8 C)  97.6 F (36.4 C)  TempSrc:  Oral  Oral  SpO2:  96% 91% 98%  Weight: 81.6 kg   81.6 kg  Height: 5\' 9"  (1.753 m)       Intake/Output Summary (Last 24 hours) at 06/24/2018 0729 Last data filed at 06/24/2018 0600 Gross per 24 hour  Intake 1112.43 ml  Output 400 ml  Net 712.43 ml   Last 3 Weights 06/24/2018  06/23/2018 06/23/2018  Weight (lbs) 179 lb 14.4 oz 179 lb 14.4 oz 192 lb  Weight (kg) 81.602 kg 81.602 kg 87.091 kg      Telemetry     NSR  - Personally Reviewed  ECG     NSR  - Personally Reviewed  Physical Exam   GEN: Elderly gentleman, no acute distress.   Neck: No JVD Cardiac: RRR, no murmurs, rubs, or gallops.  Respiratory: Clear to auscultation bilaterally. GI: Soft, nontender, non-distended  MS: No edema; No deformity.  Right radial pulses 2+.  Neuro:  Nonfocal  Psych: Normal affect   Labs    Chemistry Recent Labs  Lab 06/23/18 1701 06/24/18 0234  NA 140 140  K 4.2 4.0  CL 103 107  CO2 22 23  GLUCOSE 135* 111*  BUN 22 25*  CREATININE 1.04 1.00  CALCIUM 9.5 8.9  PROT  --  6.4*  ALBUMIN  --  3.2*  AST  --  40  ALT  --  49*  ALKPHOS  --  67  BILITOT  --  0.8  GFRNONAA >60 >60  GFRAA >60 >60  ANIONGAP 15 10     Hematology Recent Labs  Lab 06/23/18 1701 06/24/18 0234  WBC 7.9 10.1  RBC 4.00* 3.41*  HGB 13.4 11.6*  HCT 41.4 34.8*  MCV 103.5* 102.1*  MCH 33.5 34.0  MCHC 32.4 33.3  RDW 14.6 14.5  PLT 158 150    Cardiac Enzymes Recent Labs  Lab 06/23/18 1701 06/23/18 1932 06/24/18 0234  TROPONINI 0.32* 0.33* 0.25*   No results for input(s): TROPIPOC in the last 168 hours.   BNP Recent Labs  Lab 06/23/18 1933  BNP 314.2*     DDimer No results for input(s): DDIMER in the last 168 hours.   Radiology    Dg Chest 2 View  Result Date: 06/23/2018 CLINICAL DATA:  Shortness of breath. EXAM: CHEST - 2 VIEW COMPARISON:  04/11/2017. FINDINGS: Borderline cardiomegaly. Pleuroparenchymal scarring bilaterally is stable. No consolidation or edema. No effusion or pneumothorax. Bones unremarkable. IMPRESSION: Stable chest. Electronically Signed   By: Staci Righter M.D.   On: 06/23/2018 20:07   Ct Head Wo Contrast  Result Date: 06/23/2018 CLINICAL DATA:  Vertigo.  Recent fall.  History of BPPV. EXAM: CT HEAD WITHOUT CONTRAST TECHNIQUE: Contiguous  axial images were obtained from the base of the skull through the vertex without intravenous contrast. COMPARISON:  CT head 08/12/2015. FINDINGS: Brain: No evidence for acute infarction, hemorrhage, mass lesion, hydrocephalus, or extra-axial fluid. Generalized atrophy. Hypoattenuation of white matter likely small vessel disease. In the LEFT parietal parasagittal vertex, there is a destructive lesion of the skull. There is both intracranial and extracranial extension, with a 3 mm thick epidural component seen best on sagittal series 6 image 33. Vascular: Calcification of the cavernous internal carotid arteries consistent with cerebrovascular atherosclerotic disease. No signs of intracranial large vessel occlusion. Skull: Destructive skull lesion, LEFT parasagittal parietal bone, with largest confluent area destruction 15 x 18 x 14 mm. Lesion is contiguous with the superior sagittal sinus but I do not believe that it is venous in origin. A second lesion is seen more caudally in the midline, involving the occipital bone, again with intracranial extension. Subgaleal extension is most prominent at the LEFT parietal region. No fracture related to the recent fall. Sinuses/Orbits: Paranasal sinuses are clear. No orbital findings. Slight frontal soft tissue swelling. Other: Compared with previous CT from 2017, the skull was normal. IMPRESSION: 1. Destructive skull lesion, LEFT parasagittal parietal bone, with intracranial and extracranial extension. Smaller lesion also involving the occipital bone in the midline. The lesions are contiguous with the superior sagittal sinus but I do not favor that it is venous in origin. Concern for osseous spread of unknown malignancy. Aggressive pacchionian granulations cannot completely be excluded, but would appear unlikely given the normal skull just 3 years earlier. 2. Atrophy and small vessel disease. No acute intracranial findings. 3. No skull fracture or intracranial hemorrhage. 4. If  further investigation desired, MRI of the brain without and with contrast recommended for further evaluation. Electronically Signed   By: Staci Righter M.D.   On: 06/23/2018 17:43   [name] MW Contrast  Result Date: 06/24/2018 CLINICAL DATA:  78 year old male with headache and destructive lesion at the skull vertex by CT yesterday. EXAM: MRI HEAD WITHOUT AND WITH CONTRAST TECHNIQUE: Multiplanar, multiecho pulse sequences of the brain and surrounding structures were obtained without and with intravenous contrast. CONTRAST:  8 milliliters Gadavist COMPARISON:  Head CT 06/23/2018. FINDINGS: Brain: Poorly delineated and infiltrative appearing lesion of the posterior skull vertex demonstrates replacement of the normal marrow in an area of 6-7 centimeters (series 9, image 13), with associated abnormal diffusion and heterogeneous enhancement of the bone. There is extraosseous extension both intracranially and superficially into the scalp (series 20, image 13) encompassing 16-20 millimeters in  thickness. Associated fairly focal pachymeningeal thickening and T2 hyperintense soft tissue mass centered around the posterosuperior sagittal sinus (series 17, image 5, series 18, image 5). Some of the lesion appears centrally necrotic or nonvascular (series 19, image 48). The sagittal sinus is narrow it but remains patent. There is no significant mass effect on the brain parenchyma at this time, and no cerebral edema identified. Elsewhere the dura appears within normal limits. No leptomeningeal thickening identified. No abnormal parenchymal enhancement of the brain is identified. Cerebral volume is within normal limits. No restricted diffusion to suggest acute infarction. No midline shift,ventriculomegaly, or acute intracranial hemorrhage. Cervicomedullary junction and pituitary are within normal limits. There are occasional chronic micro hemorrhages in the brain (right parietal lobe series 14, image 35). No cortical  encephalomalacia identified and otherwise normal for age gray and white matter signal. Vascular: Major intracranial vascular flow voids are preserved, although the superior sagittal sinus is partially invaded and narrow it by the vertex lesion (series 17 images 4 through 6). The left vertebral artery is dominant. The other major dural venous sinuses are enhancing and appear to be patent. Skull and upper cervical spine: Posterior skull vertex bone lesion described above. Elsewhere visible bone marrow signal is within normal limits. Negative visible cervical spine aside from degenerative changes. Sinuses/Orbits: Negative; prior cataract surgery. Other: Mastoids are clear. There is subtle nodularity and enhancement in the left internal auditory canal (series 10, image 6 and series 19, image 15. This is 2-3 millimeters. The other visible internal auditory structures, including the left cochlea and vestibule appear grossly normal. Normal stylomastoid foramina. Abnormal scalp invasion over the posterior skull vertex as stated above. Other scalp and face soft tissues appear negative. IMPRESSION: 1. 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 No associated cerebral edema. No leptomeningeal involvement or direct brain invasion is suspected. The lesion invades and narrows the superior sagittal sinus, but the sinus remains patent. A 6-7 cm diameter area of the bone and dura appears infiltrated, with the remaining confluent extraosseous component encompassing 2-3 cm. Top differential considerations include metastasis, meningioma, and lymphoma. 2. 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 Dedicated IAC protocol brain MRI would better characterize this lesion in the future. 3. No other bone lesion and no other acute or neoplastic process identified in the  head. There are occasional chronic cerebral microhemorrhages. Electronically Signed   By: Genevie Ann M.D.   On: 06/24/2018 06:24    Cardiac Studies     Patient Profile     PAL SHELL is a 78 y.o. male with a hx of GERD, Barrett's esophagus, OSA, RA, HLD, minimal carotid disease in 2017, normal GXT 2007, RBBB presented for evaluation of dizziness/near syncope and chest pain.   He presented with exertional chest pain for approximately 2 weeks, much worse yesterday while he mowed the grass.  It was a tightness, resolved within 30 minutes at rest. Today he felt unusually tired and slept most of the day, then had a syncopal episode falling forward on his face. He was very dizzy afterwards, brought to ER by his daughter later.   Assessment & Plan    1. NSTEMI - Presented with symptoms concerning for angina. Troponin trend down 0.32>>0.33>>0.25. EKG with ventri bigeminy and RBBB. Treated with IV heparin and ASA.  LDL 103. Will start statin. Patient will need ischemic evaluation however further  evaluation of brain mass and risk of bleeding should be considered. Might need to hold heparin as well.   2. Abnormal head CT and MRI - Per report differential considerations include metastasis, meningioma, and Lymphoma. - Work up per primary team   For questions or updates, please contact [facility] Please consult [email or web address] for contact info under        SignedLeanor Kail, PA  06/24/2018, 7:29 AM     Attending Note:   The patient was seen and examined.  Agree with assessment and plan as noted above.  Changes made to the above note as needed.  Patient seen and independently examined with  Robbie Lis, PA .   We discussed all aspects of the encounter. I agree with the assessment and plan as stated above.  1.  Unstable angina: The patient presents with symptoms that are consistent with unstable angina.  They have progressed over the past couple weeks.  He now has episodes of  angina mowing to laps with his lawnmower.  He is scheduled for heart catheterization.  I discussed the risks, benefits, options of heart cath.  He understands and agrees to proceed.  2.  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  He is scheduled to have an MRI today. I do think that we should further evaluate and treat his unstable angina if needed. He is having fairly predictable angina at a low level of exercise.  I have ordered a PSA.   I have spent a total of 40 minutes with patient reviewing hospital  notes , telemetry, EKGs, labs and examining patient as well as establishing an assessment and plan that was discussed with the patient. > 50% of time was spent in direct patient care.    Thayer Headings, Brooke Bonito., MD, [facility] 06/24/2018, 9:09 AM [address],  [place] Pager [phone]

## 2018-06-24 NOTE — Progress Notes (Signed)
RT called regarding CPAP. RT was in a rapid on 3E. RT checked on pt, pt resting comfortably at this time, pt will be placed on cpap tonight. RN aware.

## 2018-06-24 NOTE — Progress Notes (Signed)
PROGRESS NOTE    Nicholas Webb  WLS:[id number] DOB: 1941-02-05 DOA: 06/23/2018 PCP: Maurice Small, MD   Brief Narrative: 78 y.o. male  W/ hc of [place]/GERD ,steoarthritis/RA, hyperlipidemia came to the hospital with complains of chest pain that started While he was cutting grass 4/22, substernal, squeezing in nature without associated shortness of breath.  After rest pain resolved but would come back with activity.  Patient also felt lightheaded dizzy with presyncopal episode on the morning of 4/23. recently as outpatient has been evaluated by a neurologist for headaches and has been labeled as tension headache.  Currently he is not having 1.  In the ER his troponins were elevated at 0.3.  EKG did not show any acute ST-T changes but there was concerns of non-STEMI therefore cardiology team was consulted and started on heparin drip.  CT of the head showedAbnormal destructive skull lesion.  Subjective: Seen this morning resting well denies any complaint.  Went for cardiac cath at rest being seen by cardiology.  No chest pain this morning.  Assessment & Plan:  Chest pain with positive troponin, consistent with NSTEMI w presyncopal episode: trop 0.3.>0.33>0.24m EKG vent bigimeny and RBBB. He is on heparin drip, aspirin. Add statin. Appreciate cardiology input, going for cardiac cath today.  Skull mass noted in CT/MRI.  Patient has had about 10 pound weight loss unclear what timeframe, no prior history of malignancy in family or himself. 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 No associated cerebral edema. No leptomeningeal involvement or direct brain invasion is suspected. The lesion invades and narrows the superior sagittal sinus, but the sinus remains patent. A 6-7 cm diameter area of the bone and dura appears infiltrated, with the remaining confluent extraosseous  component encompassing 2-3 cm. Top differential considerations include metastasis, meningioma, and lymphoma." Neurosurgery consulted, appreciate input after cardiac cath will continue further work-up with imaging- bone scan, psa, thyroid US, CT chest/abd/pelvis.Given contrast load will hydrate him w NSS post cath.  Discussed with neurosurgery team and they are okay with anticoagulation with heparin for now for NSTEMI.  RA, stable on humira and methotrexate  GERD c/w ppi  Generalized OA: prn tylenol  HLD added statins   DVT prophylaxis: Heparin Code Status: full Family Communication: discussed the plan of care with the patient in detail, reviewed imaging.  He has verbalized understanding.family at bedside Disposition Plan: remains inpatient pending clinical improvement.   Consultants:  Neurosurgery Cardiology Procedures:  Antimicrobials: Anti-infectives (From admission, onward)   None       Objective: Vitals:   06/23/18 2000 06/23/18 2038 06/23/18 2109 06/24/18 0402  BP:  125/78 111/72 (!) 103/59  Pulse:      Resp:  16  16  Temp:  98.2 F (36.8 C)  97.6 F (36.4 C)  TempSrc:  Oral  Oral  SpO2:  96% 91% 98%  Weight: 81.6 kg   81.6 kg  Height: 5\' 9"  (1.753 m)       Intake/Output Summary (Last 24 hours) at 06/24/2018 1244 Last data filed at 06/24/2018 1117 Gross per 24 hour  Intake 1170.19 ml  Output 400 ml  Net 770.19 ml   Filed Weights   06/23/18 1644 06/23/18 2000 06/24/18 0402  Weight: 87.1 kg 81.6 kg 81.6 kg   Weight change:   Body mass index is 26.57 kg/m.  Intake/Output from previous day: 04/23 0701 - 04/24 0700 In: 1112.4 [  I.V.:1112.4] Out: 400 [Urine:400] Intake/Output this shift: Total I/O In: 57.8 [I.V.:57.8] Out: -   Examination:  General exam: Appears calm and comfortable,Not in distress, older fore the age HEENT:PERRL,Oral mucosa moist, Ear/Nose normal on gross exam Respiratory system: Bilateral equal air entry, normal vesicular breath  sounds, no wheezes or crackles  Cardiovascular system: S1 & S2 heard,No JVD, murmurs. Gastrointestinal system: Abdomen is  soft, non tender, non distended, BS +  Nervous System:Alert and oriented. No focal neurological deficits/moving extremities, sensation intact. Extremities: No edema, no clubbing, distal peripheral pulses palpable. Skin: No rashes, lesions, no icterus MSK: Normal muscle bulk,tone ,power  Medications:  Scheduled Meds:  [START ON 06/25/2018] aspirin EC  81 mg Oral Daily   chlorhexidine  15 mL Mouth Rinse BID   folic acid  2 mg Oral Daily   mouth rinse  15 mL Mouth Rinse q12n4p   pantoprazole  40 mg Oral Daily   sodium chloride flush  3 mL Intravenous Q12H   tiZANidine  2 mg Oral QHS   Continuous Infusions:  sodium chloride 100 mL/hr at 06/24/18 4782   sodium chloride     sodium chloride 1 mL/kg/hr (06/24/18 0916)   heparin 1,100 Units/hr (06/24/18 0601)    Data Reviewed: I have personally reviewed following labs and imaging studies  CBC: Recent Labs  Lab 06/23/18 1701 06/24/18 0234  WBC 7.9 10.1  NEUTROABS 5.7  --   HGB 13.4 11.6*  HCT 41.4 34.8*  MCV 103.5* 102.1*  PLT 158 956   Basic Metabolic Panel: Recent Labs  Lab 06/23/18 1701 06/24/18 0234  NA 140 140  K 4.2 4.0  CL 103 107  CO2 22 23  GLUCOSE 135* 111*  BUN 22 25*  CREATININE 1.04 1.00  CALCIUM 9.5 8.9  MG 2.0 1.9   GFR: Estimated Creatinine Clearance: 62.8 mL/min (by C-G formula based on SCr of 1 mg/dL). Liver Function Tests: Recent Labs  Lab 06/24/18 0234  AST 40  ALT 49*  ALKPHOS 67  BILITOT 0.8  PROT 6.4*  ALBUMIN 3.2*   No results for input(s): LIPASE, AMYLASE in the last 168 hours. No results for input(s): AMMONIA in the last 168 hours. Coagulation Profile: No results for input(s): INR, PROTIME in the last 168 hours. Cardiac Enzymes: Recent Labs  Lab 06/23/18 1701 06/23/18 1932 06/24/18 0234 06/24/18 0649  TROPONINI 0.32* 0.33* 0.25* 0.17*   BNP  (last 3 results) No results for input(s): PROBNP in the last 8760 hours. HbA1C: Recent Labs    06/23/18 1933  HGBA1C 5.9*   CBG: No results for input(s): GLUCAP in the last 168 hours. Lipid Profile: Recent Labs    06/23/18 1933  CHOL 155  HDL 45  LDLCALC 103*  TRIG 36  CHOLHDL 3.4   Thyroid Function Tests: Recent Labs    06/23/18 1933  TSH 2.365   Anemia Panel: No results for input(s): VITAMINB12, FOLATE, FERRITIN, TIBC, IRON, RETICCTPCT in the last 72 hours. Sepsis Labs: No results for input(s): PROCALCITON, LATICACIDVEN in the last 168 hours.  No results found for this or any previous visit (from the past 240 hour(s)).    Radiology Studies: Dg Chest 2 View  Result Date: 06/23/2018 CLINICAL DATA:  Shortness of breath. EXAM: CHEST - 2 VIEW COMPARISON:  04/11/2017. FINDINGS: Borderline cardiomegaly. Pleuroparenchymal scarring bilaterally is stable. No consolidation or edema. No effusion or pneumothorax. Bones unremarkable. IMPRESSION: Stable chest. Electronically Signed   By: Staci Righter M.D.   On: 06/23/2018 20:07   Ct  Head Wo Contrast  Result Date: 06/23/2018 CLINICAL DATA:  Vertigo.  Recent fall.  History of BPPV. EXAM: CT HEAD WITHOUT CONTRAST TECHNIQUE: Contiguous axial images were obtained from the base of the skull through the vertex without intravenous contrast. COMPARISON:  CT head 08/12/2015. FINDINGS: Brain: No evidence for acute infarction, hemorrhage, mass lesion, hydrocephalus, or extra-axial fluid. Generalized atrophy. Hypoattenuation of white matter likely small vessel disease. In the LEFT parietal parasagittal vertex, there is a destructive lesion of the skull. There is both intracranial and extracranial extension, with a 3 mm thick epidural component seen best on sagittal series 6 image 33. Vascular: Calcification of the cavernous internal carotid arteries consistent with cerebrovascular atherosclerotic disease. No signs of intracranial large vessel  occlusion. Skull: Destructive skull lesion, LEFT parasagittal parietal bone, with largest confluent area destruction 15 x 18 x 14 mm. Lesion is contiguous with the superior sagittal sinus but I do not believe that it is venous in origin. A second lesion is seen more caudally in the midline, involving the occipital bone, again with intracranial extension. Subgaleal extension is most prominent at the LEFT parietal region. No fracture related to the recent fall. Sinuses/Orbits: Paranasal sinuses are clear. No orbital findings. Slight frontal soft tissue swelling. Other: Compared with previous CT from 2017, the skull was normal. IMPRESSION: 1. Destructive skull lesion, LEFT parasagittal parietal bone, with intracranial and extracranial extension. Smaller lesion also involving the occipital bone in the midline. The lesions are contiguous with the superior sagittal sinus but I do not favor that it is venous in origin. Concern for osseous spread of unknown malignancy. Aggressive pacchionian granulations cannot completely be excluded, but would appear unlikely given the normal skull just 3 years earlier. 2. Atrophy and small vessel disease. No acute intracranial findings. 3. No skull fracture or intracranial hemorrhage. 4. If further investigation desired, MRI of the brain without and with contrast recommended for further evaluation. Electronically Signed   By: Staci Righter M.D.   On: 06/23/2018 17:43   [name] OV Contrast  Result Date: 06/24/2018 CLINICAL DATA:  78 year old male with headache and destructive lesion at the skull vertex by CT yesterday. EXAM: MRI HEAD WITHOUT AND WITH CONTRAST TECHNIQUE: Multiplanar, multiecho pulse sequences of the brain and surrounding structures were obtained without and with intravenous contrast. CONTRAST:  8 milliliters Gadavist COMPARISON:  Head CT 06/23/2018. FINDINGS: Brain: Poorly delineated and infiltrative appearing lesion of the posterior skull vertex demonstrates  replacement of the normal marrow in an area of 6-7 centimeters (series 9, image 13), with associated abnormal diffusion and heterogeneous enhancement of the bone. There is extraosseous extension both intracranially and superficially into the scalp (series 20, image 13) encompassing 16-20 millimeters in thickness. Associated fairly focal pachymeningeal thickening and T2 hyperintense soft tissue mass centered around the posterosuperior sagittal sinus (series 17, image 5, series 18, image 5). Some of the lesion appears centrally necrotic or nonvascular (series 19, image 48). The sagittal sinus is narrow it but remains patent. There is no significant mass effect on the brain parenchyma at this time, and no cerebral edema identified. Elsewhere the dura appears within normal limits. No leptomeningeal thickening identified. No abnormal parenchymal enhancement of the brain is identified. Cerebral volume is within normal limits. No restricted diffusion to suggest acute infarction. No midline shift,ventriculomegaly, or acute intracranial hemorrhage. Cervicomedullary junction and pituitary are within normal limits. There are occasional chronic micro hemorrhages in the brain (right parietal lobe series 14, image 35). No cortical encephalomalacia identified and otherwise  normal for age gray and white matter signal. Vascular: Major intracranial vascular flow voids are preserved, although the superior sagittal sinus is partially invaded and narrow it by the vertex lesion (series 17 images 4 through 6). The left vertebral artery is dominant. The other major dural venous sinuses are enhancing and appear to be patent. Skull and upper cervical spine: Posterior skull vertex bone lesion described above. Elsewhere visible bone marrow signal is within normal limits. Negative visible cervical spine aside from degenerative changes. Sinuses/Orbits: Negative; prior cataract surgery. Other: Mastoids are clear. There is subtle nodularity and  enhancement in the left internal auditory canal (series 10, image 6 and series 19, image 15. This is 2-3 millimeters. The other visible internal auditory structures, including the left cochlea and vestibule appear grossly normal. Normal stylomastoid foramina. Abnormal scalp invasion over the posterior skull vertex as stated above. Other scalp and face soft tissues appear negative. IMPRESSION: 1. 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 No associated cerebral edema. No leptomeningeal involvement or direct brain invasion is suspected. The lesion invades and narrows the superior sagittal sinus, but the sinus remains patent. A 6-7 cm diameter area of the bone and dura appears infiltrated, with the remaining confluent extraosseous component encompassing 2-3 cm. Top differential considerations include metastasis, meningioma, and lymphoma. 2. 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 Dedicated IAC protocol brain MRI would better characterize this lesion in the future. 3. No other bone lesion and no other acute or neoplastic process identified in the head. There are occasional chronic cerebral microhemorrhages. Electronically Signed   By: Genevie Ann M.D.   On: 06/24/2018 06:24      LOS: 1 day   Time spent: More than 50% of that time was spent in counseling and/or coordination of care.  Antonieta Pert, MD [facility]  06/24/2018, 12:44 PM

## 2018-06-24 NOTE — Progress Notes (Signed)
  Echocardiogram 2D Echocardiogram has been performed.  Jannett Celestine 06/24/2018, 10:21 AM

## 2018-06-24 NOTE — Interval H&P Note (Signed)
History and Physical Interval Note:  06/24/2018 2:07 PM  Nicholas Webb  has presented today for cardiac cath with the diagnosis of non stemi.  The various methods of treatment have been discussed with the patient and family. After consideration of risks, benefits and other options for treatment, the patient has consented to  Procedure(s): LEFT HEART CATH AND CORONARY ANGIOGRAPHY (N/A) as a surgical intervention.  The patient's history has been reviewed, patient examined, no change in status, stable for surgery.  I have reviewed the patient's chart and labs.  Questions were answered to the patient's satisfaction.    Cath Lab Visit (complete for each Cath Lab visit)  Clinical Evaluation Leading to the Procedure:   ACS: Yes.    Non-ACS:    Anginal Classification: CCS II  Anti-ischemic medical therapy: No Therapy  Non-Invasive Test Results: No non-invasive testing performed  Prior CABG: No previous CABG         Lauree Chandler

## 2018-06-24 NOTE — H&P (View-Only) (Signed)
Progress Note  Patient Name: Nicholas Webb Date of Encounter: 06/24/2018  Primary Cardiologist: Candee Furbish, MD   Subjective   78 year old gentleman with a history of gastroesophageal reflux, hyperlipidemia, minimal carotid artery disease who is seen yesterday by Dr. Meda Coffee for further evaluation of an elevated troponin level.  Patient describes exertional chest pain for the past several weeks.  He noticed it initially while mowing the grass. Yesterday the patient had an episode of syncope.  It was preceded by some dizziness for a few seconds and then he completely lost consciousness.  He is scheduled for heart catheterization today given his episodes of unstable angina and the episode of syncope.  CT of the head revealed no lesions in the brain.  He was found to have some skull lesions which are likely metastatic disease.  There is no PSA on file in our chart.  Inpatient Medications    Scheduled Meds:  aspirin EC  81 mg Oral Daily   chlorhexidine  15 mL Mouth Rinse BID   folic acid  2 mg Oral Daily   mouth rinse  15 mL Mouth Rinse q12n4p   pantoprazole  40 mg Oral Daily   tiZANidine  2 mg Oral QHS   Continuous Infusions:  sodium chloride 100 mL/hr at 06/24/18 1941   heparin 1,100 Units/hr (06/23/18 1904)   PRN Meds: acetaminophen **OR** acetaminophen, bisacodyl, hydrALAZINE, HYDROcodone-acetaminophen, ondansetron **OR** ondansetron (ZOFRAN) IV, senna-docusate, sodium phosphate   Vital Signs    Vitals:   06/23/18 2000 06/23/18 2038 06/23/18 2109 06/24/18 0402  BP:  125/78 111/72 (!) 103/59  Pulse:      Resp:  16  16  Temp:  98.2 F (36.8 C)  97.6 F (36.4 C)  TempSrc:  Oral  Oral  SpO2:  96% 91% 98%  Weight: 81.6 kg   81.6 kg  Height: 5\' 9"  (1.753 m)       Intake/Output Summary (Last 24 hours) at 06/24/2018 0729 Last data filed at 06/24/2018 0600 Gross per 24 hour  Intake 1112.43 ml  Output 400 ml  Net 712.43 ml   Last 3 Weights 06/24/2018  06/23/2018 06/23/2018  Weight (lbs) 179 lb 14.4 oz 179 lb 14.4 oz 192 lb  Weight (kg) 81.602 kg 81.602 kg 87.091 kg      Telemetry     NSR  - Personally Reviewed  ECG     NSR  - Personally Reviewed  Physical Exam   GEN: Elderly gentleman, no acute distress.   Neck: No JVD Cardiac: RRR, no murmurs, rubs, or gallops.  Respiratory: Clear to auscultation bilaterally. GI: Soft, nontender, non-distended  MS: No edema; No deformity.  Right radial pulses 2+.  Neuro:  Nonfocal  Psych: Normal affect   Labs    Chemistry Recent Labs  Lab 06/23/18 1701 06/24/18 0234  NA 140 140  K 4.2 4.0  CL 103 107  CO2 22 23  GLUCOSE 135* 111*  BUN 22 25*  CREATININE 1.04 1.00  CALCIUM 9.5 8.9  PROT  --  6.4*  ALBUMIN  --  3.2*  AST  --  40  ALT  --  49*  ALKPHOS  --  67  BILITOT  --  0.8  GFRNONAA >60 >60  GFRAA >60 >60  ANIONGAP 15 10     Hematology Recent Labs  Lab 06/23/18 1701 06/24/18 0234  WBC 7.9 10.1  RBC 4.00* 3.41*  HGB 13.4 11.6*  HCT 41.4 34.8*  MCV 103.5* 102.1*  MCH 33.5 34.0  MCHC 32.4 33.3  RDW 14.6 14.5  PLT 158 150    Cardiac Enzymes Recent Labs  Lab 06/23/18 1701 06/23/18 1932 06/24/18 0234  TROPONINI 0.32* 0.33* 0.25*   No results for input(s): TROPIPOC in the last 168 hours.   BNP Recent Labs  Lab 06/23/18 1933  BNP 314.2*     DDimer No results for input(s): DDIMER in the last 168 hours.   Radiology    Dg Chest 2 View  Result Date: 06/23/2018 CLINICAL DATA:  Shortness of breath. EXAM: CHEST - 2 VIEW COMPARISON:  04/11/2017. FINDINGS: Borderline cardiomegaly. Pleuroparenchymal scarring bilaterally is stable. No consolidation or edema. No effusion or pneumothorax. Bones unremarkable. IMPRESSION: Stable chest. Electronically Signed   By: Staci Righter M.D.   On: 06/23/2018 20:07   Ct Head Wo Contrast  Result Date: 06/23/2018 CLINICAL DATA:  Vertigo.  Recent fall.  History of BPPV. EXAM: CT HEAD WITHOUT CONTRAST TECHNIQUE: Contiguous  axial images were obtained from the base of the skull through the vertex without intravenous contrast. COMPARISON:  CT head 08/12/2015. FINDINGS: Brain: No evidence for acute infarction, hemorrhage, mass lesion, hydrocephalus, or extra-axial fluid. Generalized atrophy. Hypoattenuation of white matter likely small vessel disease. In the LEFT parietal parasagittal vertex, there is a destructive lesion of the skull. There is both intracranial and extracranial extension, with a 3 mm thick epidural component seen best on sagittal series 6 image 33. Vascular: Calcification of the cavernous internal carotid arteries consistent with cerebrovascular atherosclerotic disease. No signs of intracranial large vessel occlusion. Skull: Destructive skull lesion, LEFT parasagittal parietal bone, with largest confluent area destruction 15 x 18 x 14 mm. Lesion is contiguous with the superior sagittal sinus but I do not believe that it is venous in origin. A second lesion is seen more caudally in the midline, involving the occipital bone, again with intracranial extension. Subgaleal extension is most prominent at the LEFT parietal region. No fracture related to the recent fall. Sinuses/Orbits: Paranasal sinuses are clear. No orbital findings. Slight frontal soft tissue swelling. Other: Compared with previous CT from 2017, the skull was normal. IMPRESSION: 1. Destructive skull lesion, LEFT parasagittal parietal bone, with intracranial and extracranial extension. Smaller lesion also involving the occipital bone in the midline. The lesions are contiguous with the superior sagittal sinus but I do not favor that it is venous in origin. Concern for osseous spread of unknown malignancy. Aggressive pacchionian granulations cannot completely be excluded, but would appear unlikely given the normal skull just 3 years earlier. 2. Atrophy and small vessel disease. No acute intracranial findings. 3. No skull fracture or intracranial hemorrhage. 4. If  further investigation desired, MRI of the brain without and with contrast recommended for further evaluation. Electronically Signed   By: Staci Righter M.D.   On: 06/23/2018 17:43   [name] WU Contrast  Result Date: 06/24/2018 CLINICAL DATA:  78 year old male with headache and destructive lesion at the skull vertex by CT yesterday. EXAM: MRI HEAD WITHOUT AND WITH CONTRAST TECHNIQUE: Multiplanar, multiecho pulse sequences of the brain and surrounding structures were obtained without and with intravenous contrast. CONTRAST:  8 milliliters Gadavist COMPARISON:  Head CT 06/23/2018. FINDINGS: Brain: Poorly delineated and infiltrative appearing lesion of the posterior skull vertex demonstrates replacement of the normal marrow in an area of 6-7 centimeters (series 9, image 13), with associated abnormal diffusion and heterogeneous enhancement of the bone. There is extraosseous extension both intracranially and superficially into the scalp (series 20, image 13) encompassing 16-20 millimeters in  thickness. Associated fairly focal pachymeningeal thickening and T2 hyperintense soft tissue mass centered around the posterosuperior sagittal sinus (series 17, image 5, series 18, image 5). Some of the lesion appears centrally necrotic or nonvascular (series 19, image 48). The sagittal sinus is narrow it but remains patent. There is no significant mass effect on the brain parenchyma at this time, and no cerebral edema identified. Elsewhere the dura appears within normal limits. No leptomeningeal thickening identified. No abnormal parenchymal enhancement of the brain is identified. Cerebral volume is within normal limits. No restricted diffusion to suggest acute infarction. No midline shift,ventriculomegaly, or acute intracranial hemorrhage. Cervicomedullary junction and pituitary are within normal limits. There are occasional chronic micro hemorrhages in the brain (right parietal lobe series 14, image 35). No cortical  encephalomalacia identified and otherwise normal for age gray and white matter signal. Vascular: Major intracranial vascular flow voids are preserved, although the superior sagittal sinus is partially invaded and narrow it by the vertex lesion (series 17 images 4 through 6). The left vertebral artery is dominant. The other major dural venous sinuses are enhancing and appear to be patent. Skull and upper cervical spine: Posterior skull vertex bone lesion described above. Elsewhere visible bone marrow signal is within normal limits. Negative visible cervical spine aside from degenerative changes. Sinuses/Orbits: Negative; prior cataract surgery. Other: Mastoids are clear. There is subtle nodularity and enhancement in the left internal auditory canal (series 10, image 6 and series 19, image 15. This is 2-3 millimeters. The other visible internal auditory structures, including the left cochlea and vestibule appear grossly normal. Normal stylomastoid foramina. Abnormal scalp invasion over the posterior skull vertex as stated above. Other scalp and face soft tissues appear negative. IMPRESSION: 1. 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 No associated cerebral edema. No leptomeningeal involvement or direct brain invasion is suspected. The lesion invades and narrows the superior sagittal sinus, but the sinus remains patent. A 6-7 cm diameter area of the bone and dura appears infiltrated, with the remaining confluent extraosseous component encompassing 2-3 cm. Top differential considerations include metastasis, meningioma, and lymphoma. 2. 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 Dedicated IAC protocol brain MRI would better characterize this lesion in the future. 3. No other bone lesion and no other acute or neoplastic process identified in the  head. There are occasional chronic cerebral microhemorrhages. Electronically Signed   By: Genevie Ann M.D.   On: 06/24/2018 06:24    Cardiac Studies     Patient Profile     JERET GOYER is a 78 y.o. male with a hx of GERD, Barrett's esophagus, OSA, RA, HLD, minimal carotid disease in 2017, normal GXT 2007, RBBB presented for evaluation of dizziness/near syncope and chest pain.   He presented with exertional chest pain for approximately 2 weeks, much worse yesterday while he mowed the grass.  It was a tightness, resolved within 30 minutes at rest. Today he felt unusually tired and slept most of the day, then had a syncopal episode falling forward on his face. He was very dizzy afterwards, brought to ER by his daughter later.   Assessment & Plan    1. NSTEMI - Presented with symptoms concerning for angina. Troponin trend down 0.32>>0.33>>0.25. EKG with ventri bigeminy and RBBB. Treated with IV heparin and ASA.  LDL 103. Will start statin. Patient will need ischemic evaluation however further  evaluation of brain mass and risk of bleeding should be considered. Might need to hold heparin as well.   2. Abnormal head CT and MRI - Per report differential considerations include metastasis, meningioma, and Lymphoma. - Work up per primary team   For questions or updates, please contact [facility] Please consult [email or web address] for contact info under        SignedLeanor Kail, PA  06/24/2018, 7:29 AM     Attending Note:   The patient was seen and examined.  Agree with assessment and plan as noted above.  Changes made to the above note as needed.  Patient seen and independently examined with  Robbie Lis, PA .   We discussed all aspects of the encounter. I agree with the assessment and plan as stated above.  1.  Unstable angina: The patient presents with symptoms that are consistent with unstable angina.  They have progressed over the past couple weeks.  He now has episodes of  angina mowing to laps with his lawnmower.  He is scheduled for heart catheterization.  I discussed the risks, benefits, options of heart cath.  He understands and agrees to proceed.  2.  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  He is scheduled to have an MRI today. I do think that we should further evaluate and treat his unstable angina if needed. He is having fairly predictable angina at a low level of exercise.  I have ordered a PSA.   I have spent a total of 40 minutes with patient reviewing hospital  notes , telemetry, EKGs, labs and examining patient as well as establishing an assessment and plan that was discussed with the patient. > 50% of time was spent in direct patient care.    Thayer Headings, Brooke Bonito., MD, [facility] 06/24/2018, 9:09 AM [address]  [place] Pager [phone]

## 2018-06-24 NOTE — Consult Note (Signed)
Reason for Consult: skull lesion Referring Physician: Dr. Ludwig Clarks is an 78 y.o. male.   HPI:  78 year old male who presented to the hospital for dizziness headaches and chest pain for 2 weeks. States that he fell at home. He denies any history of cancer.   Past Medical History:  Diagnosis Date  . Anemia of chronic disease   . Atherosclerosis of both carotid arteries   . Barrett's esophagus   . Dizziness   . GERD (gastroesophageal reflux disease)   . Hx of adenomatous colonic polyps   . Hyperlipidemia   . Macrocytosis without anemia   . Obesity   . OSA (obstructive sleep apnea)   . Rheumatoid arthritis(714.0)   . Syncope     Past Surgical History:  Procedure Laterality Date  . JOINT REPLACEMENT  2004   left    No Known Allergies  Social History   Tobacco Use  . Smoking status: Never Smoker  . Smokeless tobacco: Never Used  Substance Use Topics  . Alcohol use: Yes    Family History  Problem Relation Age of Onset  . Stroke Mother   . Glaucoma Mother   . Cancer Father   . Prostate cancer Father   . Colon cancer Father      Review of Systems  Positive ROS:   All other systems have been reviewed and were otherwise negative with the exception of those mentioned in the HPI and as above.  Objective: Vital signs in last 24 hours: Temp:  [97.3 F (36.3 C)-98.2 F (36.8 C)] 97.6 F (36.4 C) (04/24 0402) Pulse Rate:  [40-82] 40 (04/23 1930) Resp:  [14-18] 16 (04/24 0402) BP: (103-131)/(56-93) 103/59 (04/24 0402) SpO2:  [89 %-100 %] 98 % (04/24 0402) Weight:  [81.6 kg-87.1 kg] 81.6 kg (04/24 0402)  General Appearance: Alert, cooperative, no distress, appears stated age Head: Normocephalic, without obvious abnormality, atraumatic Eyes: PERRL, conjunctiva/corneas clear, EOM's intact, fundi benign, both eyes      Lungs: respirations unlabored Heart: Regular rate and rhythm Skin: flat palpable mass paracentral to the left on the vertex    NEUROLOGIC:   Mental status: A&O x4, no aphasia, good attention span, Memory and fund of knowledge Motor Exam - grossly normal, normal tone and bulk Sensory Exam - grossly normal Reflexes: not tested Coordination - not tested Gait - not tested Balance - not tested Cranial Nerves: I: smell Not tested  II: visual acuity  OS: na    OD: na  II: visual fields Full to confrontation  II: pupils Equal, round, reactive to light  III,VII: ptosis None  III,IV,VI: extraocular muscles    V: mastication   V: facial light touch sensation    V,VII: corneal reflex    VII: facial muscle function - upper    VII: facial muscle function - lower   VIII: hearing   IX: soft palate elevation    IX,X: gag reflex   XI: trapezius strength    XI: sternocleidomastoid strength   XI: neck flexion strength    XII: tongue strength      Data Review Lab Results  Component Value Date   WBC 10.1 06/24/2018   HGB 11.6 (L) 06/24/2018   HCT 34.8 (L) 06/24/2018   MCV 102.1 (H) 06/24/2018   PLT 150 06/24/2018   Lab Results  Component Value Date   NA 140 06/24/2018   K 4.0 06/24/2018   CL 107 06/24/2018   CO2 23 06/24/2018   BUN  25 (H) 06/24/2018   CREATININE 1.00 06/24/2018   GLUCOSE 111 (H) 06/24/2018   Lab Results  Component Value Date   INR 1.0 01/03/2008    Radiology: Dg Chest 2 View  Result Date: 06/23/2018 CLINICAL DATA:  Shortness of breath. EXAM: CHEST - 2 VIEW COMPARISON:  04/11/2017. FINDINGS: Borderline cardiomegaly. Pleuroparenchymal scarring bilaterally is stable. No consolidation or edema. No effusion or pneumothorax. Bones unremarkable. IMPRESSION: Stable chest. Electronically Signed   By: Staci Righter M.D.   On: 06/23/2018 20:07   Ct Head Wo Contrast  Result Date: 06/23/2018 CLINICAL DATA:  Vertigo.  Recent fall.  History of BPPV. EXAM: CT HEAD WITHOUT CONTRAST TECHNIQUE: Contiguous axial images were obtained from the base of the skull through the vertex without intravenous  contrast. COMPARISON:  CT head 08/12/2015. FINDINGS: Brain: No evidence for acute infarction, hemorrhage, mass lesion, hydrocephalus, or extra-axial fluid. Generalized atrophy. Hypoattenuation of white matter likely small vessel disease. In the LEFT parietal parasagittal vertex, there is a destructive lesion of the skull. There is both intracranial and extracranial extension, with a 3 mm thick epidural component seen best on sagittal series 6 image 33. Vascular: Calcification of the cavernous internal carotid arteries consistent with cerebrovascular atherosclerotic disease. No signs of intracranial large vessel occlusion. Skull: Destructive skull lesion, LEFT parasagittal parietal bone, with largest confluent area destruction 15 x 18 x 14 mm. Lesion is contiguous with the superior sagittal sinus but I do not believe that it is venous in origin. A second lesion is seen more caudally in the midline, involving the occipital bone, again with intracranial extension. Subgaleal extension is most prominent at the LEFT parietal region. No fracture related to the recent fall. Sinuses/Orbits: Paranasal sinuses are clear. No orbital findings. Slight frontal soft tissue swelling. Other: Compared with previous CT from 2017, the skull was normal. IMPRESSION: 1. Destructive skull lesion, LEFT parasagittal parietal bone, with intracranial and extracranial extension. Smaller lesion also involving the occipital bone in the midline. The lesions are contiguous with the superior sagittal sinus but I do not favor that it is venous in origin. Concern for osseous spread of unknown malignancy. Aggressive pacchionian granulations cannot completely be excluded, but would appear unlikely given the normal skull just 3 years earlier. 2. Atrophy and small vessel disease. No acute intracranial findings. 3. No skull fracture or intracranial hemorrhage. 4. If further investigation desired, MRI of the brain without and with contrast recommended for  further evaluation. Electronically Signed   By: Staci Righter M.D.   On: 06/23/2018 17:43   [name] XF Contrast  Result Date: 06/24/2018 CLINICAL DATA:  78 year old male with headache and destructive lesion at the skull vertex by CT yesterday. EXAM: MRI HEAD WITHOUT AND WITH CONTRAST TECHNIQUE: Multiplanar, multiecho pulse sequences of the brain and surrounding structures were obtained without and with intravenous contrast. CONTRAST:  8 milliliters Gadavist COMPARISON:  Head CT 06/23/2018. FINDINGS: Brain: Poorly delineated and infiltrative appearing lesion of the posterior skull vertex demonstrates replacement of the normal marrow in an area of 6-7 centimeters (series 9, image 13), with associated abnormal diffusion and heterogeneous enhancement of the bone. There is extraosseous extension both intracranially and superficially into the scalp (series 20, image 13) encompassing 16-20 millimeters in thickness. Associated fairly focal pachymeningeal thickening and T2 hyperintense soft tissue mass centered around the posterosuperior sagittal sinus (series 17, image 5, series 18, image 5). Some of the lesion appears centrally necrotic or nonvascular (series 19, image 48). The sagittal sinus is narrow it  but remains patent. There is no significant mass effect on the brain parenchyma at this time, and no cerebral edema identified. Elsewhere the dura appears within normal limits. No leptomeningeal thickening identified. No abnormal parenchymal enhancement of the brain is identified. Cerebral volume is within normal limits. No restricted diffusion to suggest acute infarction. No midline shift,ventriculomegaly, or acute intracranial hemorrhage. Cervicomedullary junction and pituitary are within normal limits. There are occasional chronic micro hemorrhages in the brain (right parietal lobe series 14, image 35). No cortical encephalomalacia identified and otherwise normal for age gray and white matter signal. Vascular:  Major intracranial vascular flow voids are preserved, although the superior sagittal sinus is partially invaded and narrow it by the vertex lesion (series 17 images 4 through 6). The left vertebral artery is dominant. The other major dural venous sinuses are enhancing and appear to be patent. Skull and upper cervical spine: Posterior skull vertex bone lesion described above. Elsewhere visible bone marrow signal is within normal limits. Negative visible cervical spine aside from degenerative changes. Sinuses/Orbits: Negative; prior cataract surgery. Other: Mastoids are clear. There is subtle nodularity and enhancement in the left internal auditory canal (series 10, image 6 and series 19, image 15. This is 2-3 millimeters. The other visible internal auditory structures, including the left cochlea and vestibule appear grossly normal. Normal stylomastoid foramina. Abnormal scalp invasion over the posterior skull vertex as stated above. Other scalp and face soft tissues appear negative. IMPRESSION: 1. 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 No associated cerebral edema. No leptomeningeal involvement or direct brain invasion is suspected. The lesion invades and narrows the superior sagittal sinus, but the sinus remains patent. A 6-7 cm diameter area of the bone and dura appears infiltrated, with the remaining confluent extraosseous component encompassing 2-3 cm. Top differential considerations include metastasis, meningioma, and lymphoma. 2. 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 Dedicated IAC protocol brain MRI would better characterize this lesion in the future. 3. No other bone lesion and no other acute or neoplastic process identified in the head. There are occasional chronic cerebral microhemorrhages. Electronically Signed   By: Genevie Ann  M.D.   On: 06/24/2018 06:24    Assessment/Plan: 1. 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 Recommend follow up with ENT as outpatient.   2. Skull mass- metastasis versus primary bone lesion versus lymphoma versus meningioma. This incidental finding can be worked up while his cardiac issue is being managed. Must rule out prostate, breast, kidney, thyroid, lung mets to skull. Would recommend typical medical workup such as bone scan, psa, thyroid ultrasound, and chest abd pelvis CT and typical medical workup for lymphoma. If all of this is negative and does not give a diagnosis and tissue is needed, could consider IR or we could perform biopsy once cleared from cardiac standpoint. There is no indication for urgent neurosurgical procedure at this point.    Ocie Cornfield Meyran 06/24/2018 8:25 AM

## 2018-06-24 NOTE — Progress Notes (Signed)
Pt refusing to go on cpap for the night. RT made pt aware if he were to change his mind to let us know. RT will continue to monitor as needed.

## 2018-06-24 NOTE — Progress Notes (Signed)
[facility] for heparin Indication: chest pain/ACS  No Known Allergies  Patient Measurements: Height: 5\' 9"  (175.3 cm) Weight: 179 lb 14.4 oz (81.6 kg) IBW/kg (Calculated) : 70.7 Heparin Dosing Weight: 87kg  Vital Signs: Temp: 97.6 F (36.4 C) (04/24 0402) Temp Source: Oral (04/24 0402) BP: 103/59 (04/24 0402)  Labs: Recent Labs    06/23/18 1701 06/23/18 1932 06/24/18 0234 06/24/18 0649 06/24/18 1148  HGB 13.4  --  11.6*  --   --   HCT 41.4  --  34.8*  --   --   PLT 158  --  150  --   --   HEPARINUNFRC  --   --  0.48  --  0.42  CREATININE 1.04  --  1.00  --   --   TROPONINI 0.32* 0.33* 0.25* 0.17*  --     Estimated Creatinine Clearance: 62.8 mL/min (by C-G formula based on SCr of 1 mg/dL).  Medical History: Past Medical History:  Diagnosis Date  . Anemia of chronic disease   . Atherosclerosis of both carotid arteries   . Barrett's esophagus   . Dizziness   . GERD (gastroesophageal reflux disease)   . Hx of adenomatous colonic polyps   . Hyperlipidemia   . Macrocytosis without anemia   . Obesity   . OSA (obstructive sleep apnea)   . Rheumatoid arthritis(714.0)   . Syncope    Assessment: Nicholas Webb presenting with NSTEMI. Pharmacy consulted for heparin dosing pending cath on 4/24.  No anticoagulation PTA. Heparin level therapeutic. Hgb decreased from 13.4 to 11.6 today. Pltc stable at 150. No bleeding noted per RN.  Goal of Therapy:  Heparin level 0.3-0.7 units/ml Monitor platelets by anticoagulation protocol: Yes   Plan:  Continue IV heparin at 1100 units/hr Daily heparin level and CBC. Monitor for signs/symptoms of bleeding F/u plans post cath  Nicholas Webb, [place] Pharmacist Phone [phone]  06/24/2018 1:06 PM

## 2018-06-25 ENCOUNTER — Encounter (HOSPITAL_COMMUNITY): Payer: Self-pay | Admitting: Radiology

## 2018-06-25 ENCOUNTER — Other Ambulatory Visit: Payer: Self-pay | Admitting: Student

## 2018-06-25 ENCOUNTER — Inpatient Hospital Stay (HOSPITAL_COMMUNITY): Payer: Medicare Other

## 2018-06-25 ENCOUNTER — Telehealth: Payer: Self-pay | Admitting: Student

## 2018-06-25 DIAGNOSIS — R55 Syncope and collapse: Secondary | ICD-10-CM

## 2018-06-25 DIAGNOSIS — R002 Palpitations: Secondary | ICD-10-CM

## 2018-06-25 DIAGNOSIS — I2699 Other pulmonary embolism without acute cor pulmonale: Secondary | ICD-10-CM

## 2018-06-25 LAB — BASIC METABOLIC PANEL
Anion gap: 8 (ref 5–15)
BUN: 14 mg/dL (ref 8–23)
CO2: 24 mmol/L (ref 22–32)
Calcium: 8.9 mg/dL (ref 8.9–10.3)
Chloride: 107 mmol/L (ref 98–111)
Creatinine, Ser: 0.85 mg/dL (ref 0.61–1.24)
GFR calc Af Amer: >60 mL/min (ref >60)
GFR calc non Af Amer: >60 mL/min (ref >60)
Glucose, Bld: 106 mg/dL — ABNORMAL HIGH (ref 70–99)
Potassium: 3.9 mmol/L (ref 3.5–5.1)
Sodium: 139 mmol/L (ref 135–145)

## 2018-06-25 LAB — CBC
HCT: 35 % — ABNORMAL LOW (ref 39.0–52.0)
Hemoglobin: 11.7 g/dL — ABNORMAL LOW (ref 13.0–17.0)
MCH: 34 pg (ref 26.0–34.0)
MCHC: 33.4 g/dL (ref 30.0–36.0)
MCV: 101.7 fL — ABNORMAL HIGH (ref 80.0–100.0)
Platelets: 136 10*3/uL — ABNORMAL LOW (ref 150–400)
RBC: 3.44 MIL/uL — ABNORMAL LOW (ref 4.22–5.81)
RDW: 14.7 % (ref 11.5–15.5)
WBC: 9.5 10*3/uL (ref 4.0–10.5)
nRBC: 0 % (ref 0.0–0.2)

## 2018-06-25 LAB — PSA, TOTAL AND FREE
PSA, Free Pct: 17.8 %
PSA, Free: 0.16 ng/mL
Prostate Specific Ag, Serum: 0.9 ng/mL (ref 0.0–4.0)

## 2018-06-25 MED ORDER — METOPROLOL TARTRATE 12.5 MG HALF TABLET
12.5000 mg | ORAL_TABLET | Freq: Two times a day (BID) | ORAL | Status: DC
  Administered 2018-06-25 – 2018-06-28 (×7): 12.5 mg via ORAL
  Filled 2018-06-25 (×7): qty 1

## 2018-06-25 MED ORDER — METOPROLOL TARTRATE 25 MG PO TABS: 12.5000 mg | ORAL_TABLET | Freq: Two times a day (BID) | ORAL | 1 refills | Status: DC

## 2018-06-25 MED ORDER — TICAGRELOR 90 MG PO TABS: 90.0000 mg | ORAL_TABLET | Freq: Two times a day (BID) | ORAL | 1 refills | Status: DC

## 2018-06-25 MED ORDER — IOHEXOL 300 MG/ML  SOLN
100.0000 mL | Freq: Once | INTRAMUSCULAR | Status: AC | PRN
  Administered 2018-06-25: 100 mL via INTRAVENOUS

## 2018-06-25 MED ORDER — ATORVASTATIN CALCIUM 80 MG PO TABS: 80.0000 mg | ORAL_TABLET | Freq: Every day | ORAL | 1 refills | Status: DC

## 2018-06-25 MED ORDER — HEPARIN (PORCINE) 25000 UT/250ML-% IV SOLN
1250.0000 [IU]/h | INTRAVENOUS | Status: AC
  Administered 2018-06-25: 17:00:00 1100 [IU]/h via INTRAVENOUS
  Administered 2018-06-26 – 2018-06-28 (×3): 1250 [IU]/h via INTRAVENOUS
  Filled 2018-06-25 (×3): qty 250

## 2018-06-25 NOTE — Telephone Encounter (Signed)
   TELEPHONE CALL NOTE  This patient has been deemed a candidate for follow-up tele-health visit to limit community exposure during the Covid-19 pandemic. I spoke with the patient via phone to discuss instructions. This has been outlined on the patient's AVS (dotphrase: hcevisitinfo). The patient was advised to review the section on consent for treatment as well. The patient will receive a phone call 2-3 days prior to their E-Visit at which time consent will be verbally confirmed. Will have office contact patient to schedule Virtual Office Visit appointment - patient willing to try Video type.  Patient recently declined MyChart.  Darreld Mclean, PA-C 06/25/2018 9:30 AM

## 2018-06-25 NOTE — Progress Notes (Signed)
[facility] for heparin Indication: PE  No Known Allergies  Patient Measurements: Height: 5\' 9"  (175.3 cm) Weight: 181 lb 1.6 oz (82.1 kg) IBW/kg (Calculated) : 70.7 Heparin Dosing Weight: 87kg  Vital Signs: Temp: 97.9 F (36.6 C) (04/25 1505) Temp Source: Oral (04/25 1505) BP: 130/67 (04/25 1505) Pulse Rate: 65 (04/25 1505)  Labs: Recent Labs    06/23/18 1701 06/23/18 1932 06/24/18 0234 06/24/18 0649 06/24/18 1148 06/25/18 0403  HGB 13.4  --  11.6*  --   --  11.7*  HCT 41.4  --  34.8*  --   --  35.0*  PLT 158  --  150  --   --  136*  HEPARINUNFRC  --   --  0.48  --  0.42  --   CREATININE 1.04  --  1.00  --   --  0.85  TROPONINI 0.32* 0.33* 0.25* 0.17*  --   --     Estimated Creatinine Clearance: 73.9 mL/min (by C-G formula based on SCr of 0.85 mg/dL).  Medical History: Past Medical History:  Diagnosis Date  . Anemia of chronic disease   . Atherosclerosis of both carotid arteries   . Barrett's esophagus   . Dizziness   . GERD (gastroesophageal reflux disease)   . Hx of adenomatous colonic polyps   . Hyperlipidemia   . Macrocytosis without anemia   . Obesity   . OSA (obstructive sleep apnea)   . Rheumatoid arthritis(714.0)   . Syncope    Assessment: 49 YOM s/p NSTEMI and cath with DES placed.. Pharmacy consulted for heparin dosing pending cath on 4/24.  CT now shows PE and pleural nodules. Pharmacy consulted to restart heparin. Patient noted on ASA and ticagrelor -hg= 11.7, plt= 136 -last heparin rate was 1100 units/hr with heparin level= 0.48  Goal of Therapy:  Heparin level 0.3-0.7 units/ml Monitor platelets by anticoagulation protocol: Yes   Plan:  -Restart IV heparin at 1100 units/hr -Heparin level in 6 hours and daily wth CBC daily  Hildred Laser, PharmD Clinical Pharmacist **Pharmacist phone directory can now be found on [email or web address] ([email or web address]).  Listed under [place].

## 2018-06-25 NOTE — Addendum Note (Signed)
Addended by: Sande Rives on: 06/25/2018 09:44 AM   Modules accepted: Orders

## 2018-06-25 NOTE — Progress Notes (Signed)
Cardiac Rehab Advisory Cardiac Rehab Phase I is not seeing pts face to face at this time due to Covid 19 restrictions. Ambulation is occurring through nursing, PT, and mobility teams. We will help facilitate that process as needed. We are calling pts in their rooms and discussing education. We will then deliver education materials to pts RN for delivery to pt.   Spoke with pt by phone. He is feeling well but has not been out of bed yet. Discussed MI, stent, Brilinta/ASA, restrictions, diet, walking at home for exercise, NTG, and CRPII. Pt voiced understanding and requested that I call his daughter Mickel Baas, who is a NP with [facility]. I spoke with Mickel Baas as well and reviewed information. Good reception. I will refer pt to [facility]. He understands the importance of Brilinta. I encouraged him to walk with staff this am. I will deliver his materials to his RN.  [phone] Yves Dill CES, ACSM 8:45 AM 06/25/2018

## 2018-06-25 NOTE — Progress Notes (Signed)
Ordered 14 day monitor at discharge for evaluation of syncope.

## 2018-06-25 NOTE — Progress Notes (Signed)
Progress Note  Patient Name: Nicholas Webb Date of Encounter: 06/25/2018  Primary Cardiologist: Candee Furbish, MD   Subjective   78 year old gentleman with a history of gastroesophageal reflux, hyperlipidemia, minimal carotid artery disease who is admitted for non-STEMI.  He currently feels well.  No chest pain or shortness of breath..  Inpatient Medications    Scheduled Meds:  aspirin EC  81 mg Oral Daily   atorvastatin  80 mg Oral q1800   chlorhexidine  15 mL Mouth Rinse BID   folic acid  2 mg Oral Daily   mouth rinse  15 mL Mouth Rinse q12n4p   metoprolol tartrate  12.5 mg Oral BID   pantoprazole  40 mg Oral Daily   sodium chloride flush  3 mL Intravenous Q12H   ticagrelor  90 mg Oral BID   tiZANidine  2 mg Oral QHS   Continuous Infusions:  sodium chloride 100 mL/hr at 06/24/18 7169   sodium chloride     PRN Meds: sodium chloride, acetaminophen **OR** acetaminophen, bisacodyl, hydrALAZINE, HYDROcodone-acetaminophen, ondansetron **OR** ondansetron (ZOFRAN) IV, senna-docusate, sodium chloride flush, sodium phosphate   Vital Signs    Vitals:   06/24/18 1502 06/24/18 1507 06/24/18 2157 06/25/18 0536  BP: 125/81 126/78 121/68 115/62  Pulse: 86 (!) 51 79 72  Resp: 15 18    Temp:   97.9 F (36.6 C) 98 F (36.7 C)  TempSrc:   Oral Oral  SpO2: 98% 100% 95% 94%  Weight:    82.1 kg  Height:        Intake/Output Summary (Last 24 hours) at 06/25/2018 0854 Last data filed at 06/25/2018 6789 Gross per 24 hour  Intake 936.43 ml  Output 700 ml  Net 236.43 ml   Last 3 Weights 06/25/2018 06/24/2018 06/23/2018  Weight (lbs) 181 lb 1.6 oz 179 lb 14.4 oz 179 lb 14.4 oz  Weight (kg) 82.146 kg 81.602 kg 81.602 kg      Telemetry     NSR  - Personally Reviewed  ECG    Sinus rhythm, right bundle branch block- Personally Reviewed  Physical Exam   GEN: Well nourished, well developed, in no acute distress  HEENT: normal  Neck: no JVD, carotid bruits, or  masses Cardiac: RRR; no murmurs, rubs, or gallops,no edema  Respiratory:  clear to auscultation bilaterally, normal work of breathing GI: soft, nontender, nondistended, + BS MS: no deformity or atrophy  Skin: warm and dry Neuro:  Strength and sensation are intact Psych: euthymic mood, full affect   Labs    Chemistry Recent Labs  Lab 06/23/18 1701 06/24/18 0234 06/25/18 0403  NA 140 140 139  K 4.2 4.0 3.9  CL 103 107 107  CO2 22 23 24   GLUCOSE 135* 111* 106*  BUN 22 25* 14  CREATININE 1.04 1.00 0.85  CALCIUM 9.5 8.9 8.9  PROT  --  6.4*  --   ALBUMIN  --  3.2*  --   AST  --  40  --   ALT  --  49*  --   ALKPHOS  --  67  --   BILITOT  --  0.8  --   GFRNONAA >60 >60 >60  GFRAA >60 >60 >60  ANIONGAP 15 10 8      Hematology Recent Labs  Lab 06/23/18 1701 06/24/18 0234 06/25/18 0403  WBC 7.9 10.1 9.5  RBC 4.00* 3.41* 3.44*  HGB 13.4 11.6* 11.7*  HCT 41.4 34.8* 35.0*  MCV 103.5* 102.1* 101.7*  MCH 33.5  34.0 34.0  MCHC 32.4 33.3 33.4  RDW 14.6 14.5 14.7  PLT 158 150 136*    Cardiac Enzymes Recent Labs  Lab 06/23/18 1701 06/23/18 1932 06/24/18 0234 06/24/18 0649  TROPONINI 0.32* 0.33* 0.25* 0.17*   No results for input(s): TROPIPOC in the last 168 hours.   BNP Recent Labs  Lab 06/23/18 1933  BNP 314.2*     DDimer No results for input(s): DDIMER in the last 168 hours.   Radiology    Dg Chest 2 View  Result Date: 06/23/2018 CLINICAL DATA:  Shortness of breath. EXAM: CHEST - 2 VIEW COMPARISON:  04/11/2017. FINDINGS: Borderline cardiomegaly. Pleuroparenchymal scarring bilaterally is stable. No consolidation or edema. No effusion or pneumothorax. Bones unremarkable. IMPRESSION: Stable chest. Electronically Signed   By: Staci Righter M.D.   On: 06/23/2018 20:07   Ct Head Wo Contrast  Result Date: 06/23/2018 CLINICAL DATA:  Vertigo.  Recent fall.  History of BPPV. EXAM: CT HEAD WITHOUT CONTRAST TECHNIQUE: Contiguous axial images were obtained from the  base of the skull through the vertex without intravenous contrast. COMPARISON:  CT head 08/12/2015. FINDINGS: Brain: No evidence for acute infarction, hemorrhage, mass lesion, hydrocephalus, or extra-axial fluid. Generalized atrophy. Hypoattenuation of white matter likely small vessel disease. In the LEFT parietal parasagittal vertex, there is a destructive lesion of the skull. There is both intracranial and extracranial extension, with a 3 mm thick epidural component seen best on sagittal series 6 image 33. Vascular: Calcification of the cavernous internal carotid arteries consistent with cerebrovascular atherosclerotic disease. No signs of intracranial large vessel occlusion. Skull: Destructive skull lesion, LEFT parasagittal parietal bone, with largest confluent area destruction 15 x 18 x 14 mm. Lesion is contiguous with the superior sagittal sinus but I do not believe that it is venous in origin. A second lesion is seen more caudally in the midline, involving the occipital bone, again with intracranial extension. Subgaleal extension is most prominent at the LEFT parietal region. No fracture related to the recent fall. Sinuses/Orbits: Paranasal sinuses are clear. No orbital findings. Slight frontal soft tissue swelling. Other: Compared with previous CT from 2017, the skull was normal. IMPRESSION: 1. Destructive skull lesion, LEFT parasagittal parietal bone, with intracranial and extracranial extension. Smaller lesion also involving the occipital bone in the midline. The lesions are contiguous with the superior sagittal sinus but I do not favor that it is venous in origin. Concern for osseous spread of unknown malignancy. Aggressive pacchionian granulations cannot completely be excluded, but would appear unlikely given the normal skull just 3 years earlier. 2. Atrophy and small vessel disease. No acute intracranial findings. 3. No skull fracture or intracranial hemorrhage. 4. If further investigation desired, MRI  of the brain without and with contrast recommended for further evaluation. Electronically Signed   By: Staci Righter M.D.   On: 06/23/2018 17:43   [name] ER Contrast  Result Date: 06/24/2018 CLINICAL DATA:  78 year old male with headache and destructive lesion at the skull vertex by CT yesterday. EXAM: MRI HEAD WITHOUT AND WITH CONTRAST TECHNIQUE: Multiplanar, multiecho pulse sequences of the brain and surrounding structures were obtained without and with intravenous contrast. CONTRAST:  8 milliliters Gadavist COMPARISON:  Head CT 06/23/2018. FINDINGS: Brain: Poorly delineated and infiltrative appearing lesion of the posterior skull vertex demonstrates replacement of the normal marrow in an area of 6-7 centimeters (series 9, image 13), with associated abnormal diffusion and heterogeneous enhancement of the bone. There is extraosseous extension both intracranially and superficially into the  scalp (series 20, image 13) encompassing 16-20 millimeters in thickness. Associated fairly focal pachymeningeal thickening and T2 hyperintense soft tissue mass centered around the posterosuperior sagittal sinus (series 17, image 5, series 18, image 5). Some of the lesion appears centrally necrotic or nonvascular (series 19, image 48). The sagittal sinus is narrow it but remains patent. There is no significant mass effect on the brain parenchyma at this time, and no cerebral edema identified. Elsewhere the dura appears within normal limits. No leptomeningeal thickening identified. No abnormal parenchymal enhancement of the brain is identified. Cerebral volume is within normal limits. No restricted diffusion to suggest acute infarction. No midline shift,ventriculomegaly, or acute intracranial hemorrhage. Cervicomedullary junction and pituitary are within normal limits. There are occasional chronic micro hemorrhages in the brain (right parietal lobe series 14, image 35). No cortical encephalomalacia identified and otherwise  normal for age gray and white matter signal. Vascular: Major intracranial vascular flow voids are preserved, although the superior sagittal sinus is partially invaded and narrow it by the vertex lesion (series 17 images 4 through 6). The left vertebral artery is dominant. The other major dural venous sinuses are enhancing and appear to be patent. Skull and upper cervical spine: Posterior skull vertex bone lesion described above. Elsewhere visible bone marrow signal is within normal limits. Negative visible cervical spine aside from degenerative changes. Sinuses/Orbits: Negative; prior cataract surgery. Other: Mastoids are clear. There is subtle nodularity and enhancement in the left internal auditory canal (series 10, image 6 and series 19, image 15. This is 2-3 millimeters. The other visible internal auditory structures, including the left cochlea and vestibule appear grossly normal. Normal stylomastoid foramina. Abnormal scalp invasion over the posterior skull vertex as stated above. Other scalp and face soft tissues appear negative. IMPRESSION: 1. 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 No associated cerebral edema. No leptomeningeal involvement or direct brain invasion is suspected. The lesion invades and narrows the superior sagittal sinus, but the sinus remains patent. A 6-7 cm diameter area of the bone and dura appears infiltrated, with the remaining confluent extraosseous component encompassing 2-3 cm. Top differential considerations include metastasis, meningioma, and lymphoma. 2. 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 Dedicated IAC protocol brain MRI would better characterize this lesion in the future. 3. No other bone lesion and no other acute or neoplastic process identified in the head. There are occasional chronic cerebral  microhemorrhages. Electronically Signed   By: Genevie Ann M.D.   On: 06/24/2018 06:24   US Thyroid  Result Date: 06/25/2018 CLINICAL DATA:  Other.  Intracranial mass. EXAM: THYROID ULTRASOUND TECHNIQUE: Ultrasound examination of the thyroid gland and adjacent soft tissues was performed. COMPARISON:  None. FINDINGS: Parenchymal Echotexture: Mildly heterogenous Isthmus: 0.1 cm Right lobe: 2.8 x 1.5 x 1.3 cm Left lobe: 3.2 x 1.5 x 1.3 cm _________________________________________________________ Estimated total number of nodules >/= 1 cm: 1 Number of spongiform nodules >/=  2 cm not described below (TR1): 0 Number of mixed cystic and solid nodules >/= 1.5 cm not described below (TR2): 0 _________________________________________________________ Nodule # 1: Location: Left; Superior Maximum size: 1.4 cm; Other 2 dimensions: 0.5 x 0.8 cm Composition: solid/almost completely solid (2) Echogenicity: hypoechoic (2) Shape: not taller-than-wide (0) Margins: ill-defined (0) Echogenic foci: none (0) ACR TI-RADS total points: 4. ACR TI-RADS risk category: TR4 (4-6 points). ACR TI-RADS recommendations: *Given size (>/= 1 -  1.4 cm) and appearance, a follow-up ultrasound in 1 year should be considered based on TI-RADS criteria. _________________________________________________________ Two other hyperechoic areas measured in both right and left lobes do not have the appearance of discrete nodules and are consistent with hyperechoic islands of tissue that appear benign (white knight nodules). No abnormal lymph nodes identified. IMPRESSION: 1.4 cm left superior hypoechoic nodule meets criteria for 1 year follow-up ultrasound. The above is in keeping with the ACR TI-RADS recommendations - J Am Coll Radiol 2017;14:587-595. Electronically Signed   By: Aletta Edouard M.D.   On: 06/25/2018 08:08    Cardiac Studies   LHC 06/24/18  Mid RCA lesion is 20% stenosed.  Prox Cx to Mid Cx lesion is 20% stenosed.  Prox LAD lesion is 99%  stenosed.  A drug-eluting stent was successfully placed using a STENT RESOLUTE ONYX 3.0X18.  Post intervention, there is a 0% residual stenosis.  Ost 1st Diag lesion is 80% stenosed.   1. Severe stenosis proximal to mid LAD 2. Successful PTCA/DES x 1 proximal LAD 3. Mild non-obstructive disease in the Circumflex and RCA  Patient Profile     Nicholas Webb is a 78 y.o. male with a hx of GERD, Barrett's esophagus, OSA, RA, HLD, minimal carotid disease in 2017, normal GXT 2007, RBBB presented for evaluation of dizziness/near syncope and chest pain.   Assessment & Plan    1. NSTEMI That is post left heart catheterization and stenting of his proximal LAD.  He does also have disease of his diagonal vessel, but this was not stented.  Would continue aspirin and Brilinta.  He is on high-dose statin.  Patriece Archbold need dual antiplatelet therapy for 1 year.  2. Abnormal head CT and MRI Likely due to metastasis.  Work-up per primary team.  3.  Syncope: At this point is unclear to me as to the cause of the syncope.  I spoke with his daughter and he does have vertigo, but this does not sound is similar to that.  He is never had an episode of syncope.  We Maytal Mijangos arrange for him to wear a 14-day monitor and follow-up in cardiology clinic.  I have told him no driving for 6 months per [facility].  4.  Hyperlipidemia: LDL 103.  Goal less than 70.  Continue high-dose atorvastatin.  For questions or updates, please contact [place] Please consult [email or web address] for contact info under    Derby Stanlee Roehrig sign off.   Medication Recommendations: Aspirin, Brilinta, atorvastatin Other recommendations (labs, testing, etc): None Follow up as an outpatient: To be arranged in cardiology clinic    Signed, Burdett Pinzon Meredith Leeds, MD  06/25/2018, 8:54 AM

## 2018-06-25 NOTE — Care Management (Signed)
Pt given 30 day free Brilinta card.  Advised to find out copay and plan for the next month.  Pt verbalizes understanding.

## 2018-06-25 NOTE — Progress Notes (Signed)
PROGRESS NOTE    Nicholas Webb  EGB:[id number] DOB: 1940-03-28 DOA: 06/23/2018 PCP: Maurice Small, MD   Brief Narrative: 78 y.o. male  W/ hc of [place]/GERD ,steoarthritis/RA, hyperlipidemia came to the hospital with complains of chest pain that started While he was cutting grass 4/22, substernal, squeezing in nature without associated shortness of breath.  After rest pain resolved but would come back with activity.  Patient also felt lightheaded dizzy with presyncopal episode on the morning of 4/23. recently as outpatient has been evaluated by a neurologist for headaches and has been labeled as tension headache.  Currently he is not having 1.  In the ER his troponins were elevated at 0.3.  EKG did not show any acute ST-T changes but there was concerns of non-STEMI therefore cardiology team was consulted and started on heparin drip.  CT of the head showedAbnormal destructive skull lesion. Patient was admitted underwent cardiac cath with stent placement.  For his abnormal CT scan MRI brain seen by neurosurgery recommended to continue work-up for underlying malignancy  Subjective: Resting well, denies any new complaint.  Assessment & Plan:  NSTEMI status post cath with proximal LAD lesion 99% stenosed, s/p DES advised to continue aspirin/Brilinta, statin, beta-blocker . appreciate cardio input.  Syncope: Unclear etiology, is being scheduled for outpatient cardiac monitoring device.  Advised not to drive for 6 months as per state law patient verbalized understanding  Skull mass noted in CT/MRI:Patient has had about 10 pound weight loss unclear what timeframe, no prior history of malignancy in family or himself.MRI " Posterior vertex infiltrative and poorly marginated tumor with epicenter in the bone and extension both the underlying pachymeninges and superficially into the scalp.No associated cerebral edema.No leptomeningeal involvement or direct brain invasion is suspected. The lesion invades  and narrows the superior sagittal sinus, but the sinus remains patent. A 6-7 cm diameter area of the bone and dura appears infiltrated, with the remaining confluent extraosseous component encompassing 2-3 cm.Top differential considerations include metastasis, meningioma, and lymphoma." Neurosurgery consulted, appreciate input-psa is normal, thyroid USw nodule needing 1 y follow up. CT chest/abd/pelvis pending depending on result may need to f/u as outpatient to complete other work up. Paged Dr Jana Hakim to d/w.  Acute bilateral PE:CTA done for skull mass showing emboli in distal right main pulmonary artery, the interlobar pulmonary artery RUL,RML RLL, LLL,Pulmonary artery and its branches.Clot burden is large. No evidence of right heart strain with RV/LV ratio is approximately 0.86. I discussed w Dr Margaretann Loveless from cardio- and we will anticoagualte with heparin for now and recommends to cont triple therapy for 1 month then drop aspirin and after 1 year switch brilinta to aspirin. Spoke w Neurosurgery team [place]: the yare okay to anticoagualtion as above as it is skull mass and they plan to follow as outpatient on non-emergent basis for skull mass. Discussed risks/benefits w patient.  RA,stable on humira and methotrexate.  Severe chronic pulmonary fibrosis incidentally noted in CTA chest.  GERD:c/w ppi.  Generalized OA:prn tylenol.  HLD added statins.  DVT prophylaxis:ambulate Code Status: full Family Communication: discussed the plan of care with the patient in detail, reviewed imaging, labs, ct findings. Called and updated his daughter who is a PA Horticulturist, commercial int medicine. Disposition Plan:  Home pending further workup   Consultants:  Neurosurgery Cardiology Procedures: [facility] 06/24/18  Mid RCA lesion is 20% stenosed.  Prox Cx to Mid Cx lesion is 20% stenosed.  Prox LAD lesion is 99% stenosed.  A drug-eluting stent was successfully  placed using a STENT RESOLUTE ONYX 3.0X18.  Post  intervention, there is a 0% residual stenosis.  Ost 1st Diag lesion is 80% stenosed.   1. Severe stenosis proximal to mid LAD 2. Successful PTCA/DES x 1 proximal LAD 3. Mild non-obstructive disease in the Circumflex and RCA  CTA Chest/Abdomen pelvis  1 Acute pulmonary emboli involving the distal right main pulmonary artery, the interlobar pulmonary artery, the RIGHT UPPER LOBE pulmonary artery, the RIGHT MIDDLE and RIGHT LOWER LOBE pulmonary arteries and their branches, the LEFT LOWER LOBE pulmonary artery and its branches, and minimally in the LEFT UPPER LOBE pulmonary artery. Clot burden is large. No evidence of right heart strain currently as the RV/LV ratio is approximately 0.86. 2. A primary malignancy is not clearly identified on the current examination. 3. Necrotic approximate 2 cm right paraesophageal mediastinal lymph node and approximate 2 cm left paratracheal mediastinal lymph node. No pathologic lymphadenopathy elsewhere in the chest, abdomen or pelvis. 4. Approximate 8 mm pleural based nodule in the lateral aspect of the SUPERIOR segment LEFT LOWER LOBE and approximate 4 mm nodule in the LEFT UPPER LOBE. No nodules elsewhere in either lung. 5. Severe chronic pulmonary fibrosis. No acute cardiopulmonary disease. 6. No acute abnormalities involving the abdomen or pelvis. 7. Diffuse hepatic steatosis without evidence of hepatic mass. 8. Elongated and tortuous sigmoid colon without evidence of a colonic mass.   Antimicrobials: Anti-infectives (From admission, onward)   None       Objective: Vitals:   06/24/18 1507 06/24/18 2157 06/25/18 0536 06/25/18 0928  BP: 126/78 121/68 115/62 105/66  Pulse: (!) 51 79 72   Resp: 18     Temp:  97.9 F (36.6 C) 98 F (36.7 C)   TempSrc:  Oral Oral   SpO2: 100% 95% 94%   Weight:   82.1 kg   Height:        Intake/Output Summary (Last 24 hours) at 06/25/2018 1245 Last data filed at 06/25/2018 0900 Gross per 24 hour    Intake 1118.67 ml  Output 700 ml  Net 418.67 ml   Filed Weights   06/23/18 2000 06/24/18 0402 06/25/18 0536  Weight: 81.6 kg 81.6 kg 82.1 kg   Weight change: -4.944 kg  Body mass index is 26.74 kg/m.  Intake/Output from previous day: 04/24 0701 - 04/25 0700 In: 936.4 [P.O.:240; I.V.:696.4] Out: 500 [Urine:500] Intake/Output this shift: Total I/O In: 240 [P.O.:240] Out: 200 [Urine:200]  Examination:  General exam: Calm, comfortable, not in acute distress, older for age, average built.  HEENT:Oral mucosa moist, Ear/Nose WNL grossly, dentition normal. Respiratory system: Bilateral equal air entry, no crackles and wheezing, no use of accessory muscle, non tender on palpation. Cardiovascular system: regular rate and rhythm, S1 & S2 heard, No JVD/murmurs. Gastrointestinal system: Abdomen soft, non-tender, non-distended, BS +. No hepatosplenomegaly palpable. Nervous System:Alert, awake and oriented at baseline. Able to move UE and LE, sensation intact. Extremities: No edema, distal peripheral pulses palpable.  Skin: No rashes,no icterus. MSK: Normal muscle bulk,tone, power  Medications:  Scheduled Meds:  aspirin EC  81 mg Oral Daily   atorvastatin  80 mg Oral q1800   chlorhexidine  15 mL Mouth Rinse BID   folic acid  2 mg Oral Daily   mouth rinse  15 mL Mouth Rinse q12n4p   metoprolol tartrate  12.5 mg Oral BID   pantoprazole  40 mg Oral Daily   sodium chloride flush  3 mL Intravenous Q12H   ticagrelor  90 mg Oral  BID   tiZANidine  2 mg Oral QHS   Continuous Infusions:  sodium chloride 100 mL/hr at 06/24/18 1610   sodium chloride      Data Reviewed: I have personally reviewed following labs and imaging studies  CBC: Recent Labs  Lab 06/23/18 1701 06/24/18 0234 06/25/18 0403  WBC 7.9 10.1 9.5  NEUTROABS 5.7  --   --   HGB 13.4 11.6* 11.7*  HCT 41.4 34.8* 35.0*  MCV 103.5* 102.1* 101.7*  PLT 158 150 960*   Basic Metabolic Panel: Recent Labs   Lab 06/23/18 1701 06/24/18 0234 06/25/18 0403  NA 140 140 139  K 4.2 4.0 3.9  CL 103 107 107  CO2 22 23 24   GLUCOSE 135* 111* 106*  BUN 22 25* 14  CREATININE 1.04 1.00 0.85  CALCIUM 9.5 8.9 8.9  MG 2.0 1.9  --    GFR: Estimated Creatinine Clearance: 73.9 mL/min (by C-G formula based on SCr of 0.85 mg/dL). Liver Function Tests: Recent Labs  Lab 06/24/18 0234  AST 40  ALT 49*  ALKPHOS 67  BILITOT 0.8  PROT 6.4*  ALBUMIN 3.2*   No results for input(s): LIPASE, AMYLASE in the last 168 hours. No results for input(s): AMMONIA in the last 168 hours. Coagulation Profile: No results for input(s): INR, PROTIME in the last 168 hours. Cardiac Enzymes: Recent Labs  Lab 06/23/18 1701 06/23/18 1932 06/24/18 0234 06/24/18 0649  TROPONINI 0.32* 0.33* 0.25* 0.17*   BNP (last 3 results) No results for input(s): PROBNP in the last 8760 hours. HbA1C: Recent Labs    06/23/18 1933  HGBA1C 5.9*   CBG: No results for input(s): GLUCAP in the last 168 hours. Lipid Profile: Recent Labs    06/23/18 1933  CHOL 155  HDL 45  LDLCALC 103*  TRIG 36  CHOLHDL 3.4   Thyroid Function Tests: Recent Labs    06/23/18 1933  TSH 2.365   Anemia Panel: No results for input(s): VITAMINB12, FOLATE, FERRITIN, TIBC, IRON, RETICCTPCT in the last 72 hours. Sepsis Labs: No results for input(s): PROCALCITON, LATICACIDVEN in the last 168 hours.  No results found for this or any previous visit (from the past 240 hour(s)).    Radiology Studies: Dg Chest 2 View  Result Date: 06/23/2018 CLINICAL DATA:  Shortness of breath. EXAM: CHEST - 2 VIEW COMPARISON:  04/11/2017. FINDINGS: Borderline cardiomegaly. Pleuroparenchymal scarring bilaterally is stable. No consolidation or edema. No effusion or pneumothorax. Bones unremarkable. IMPRESSION: Stable chest. Electronically Signed   By: Staci Righter M.D.   On: 06/23/2018 20:07   Ct Head Wo Contrast  Result Date: 06/23/2018 CLINICAL DATA:  Vertigo.   Recent fall.  History of BPPV. EXAM: CT HEAD WITHOUT CONTRAST TECHNIQUE: Contiguous axial images were obtained from the base of the skull through the vertex without intravenous contrast. COMPARISON:  CT head 08/12/2015. FINDINGS: Brain: No evidence for acute infarction, hemorrhage, mass lesion, hydrocephalus, or extra-axial fluid. Generalized atrophy. Hypoattenuation of white matter likely small vessel disease. In the LEFT parietal parasagittal vertex, there is a destructive lesion of the skull. There is both intracranial and extracranial extension, with a 3 mm thick epidural component seen best on sagittal series 6 image 33. Vascular: Calcification of the cavernous internal carotid arteries consistent with cerebrovascular atherosclerotic disease. No signs of intracranial large vessel occlusion. Skull: Destructive skull lesion, LEFT parasagittal parietal bone, with largest confluent area destruction 15 x 18 x 14 mm. Lesion is contiguous with the superior sagittal sinus but I do not believe  that it is venous in origin. A second lesion is seen more caudally in the midline, involving the occipital bone, again with intracranial extension. Subgaleal extension is most prominent at the LEFT parietal region. No fracture related to the recent fall. Sinuses/Orbits: Paranasal sinuses are clear. No orbital findings. Slight frontal soft tissue swelling. Other: Compared with previous CT from 2017, the skull was normal. IMPRESSION: 1. Destructive skull lesion, LEFT parasagittal parietal bone, with intracranial and extracranial extension. Smaller lesion also involving the occipital bone in the midline. The lesions are contiguous with the superior sagittal sinus but I do not favor that it is venous in origin. Concern for osseous spread of unknown malignancy. Aggressive pacchionian granulations cannot completely be excluded, but would appear unlikely given the normal skull just 3 years earlier. 2. Atrophy and small vessel disease. No  acute intracranial findings. 3. No skull fracture or intracranial hemorrhage. 4. If further investigation desired, MRI of the brain without and with contrast recommended for further evaluation. Electronically Signed   By: Staci Righter M.D.   On: 06/23/2018 17:43   [name] JS Contrast  Result Date: 06/24/2018 CLINICAL DATA:  78 year old male with headache and destructive lesion at the skull vertex by CT yesterday. EXAM: MRI HEAD WITHOUT AND WITH CONTRAST TECHNIQUE: Multiplanar, multiecho pulse sequences of the brain and surrounding structures were obtained without and with intravenous contrast. CONTRAST:  8 milliliters Gadavist COMPARISON:  Head CT 06/23/2018. FINDINGS: Brain: Poorly delineated and infiltrative appearing lesion of the posterior skull vertex demonstrates replacement of the normal marrow in an area of 6-7 centimeters (series 9, image 13), with associated abnormal diffusion and heterogeneous enhancement of the bone. There is extraosseous extension both intracranially and superficially into the scalp (series 20, image 13) encompassing 16-20 millimeters in thickness. Associated fairly focal pachymeningeal thickening and T2 hyperintense soft tissue mass centered around the posterosuperior sagittal sinus (series 17, image 5, series 18, image 5). Some of the lesion appears centrally necrotic or nonvascular (series 19, image 48). The sagittal sinus is narrow it but remains patent. There is no significant mass effect on the brain parenchyma at this time, and no cerebral edema identified. Elsewhere the dura appears within normal limits. No leptomeningeal thickening identified. No abnormal parenchymal enhancement of the brain is identified. Cerebral volume is within normal limits. No restricted diffusion to suggest acute infarction. No midline shift,ventriculomegaly, or acute intracranial hemorrhage. Cervicomedullary junction and pituitary are within normal limits. There are occasional chronic micro  hemorrhages in the brain (right parietal lobe series 14, image 35). No cortical encephalomalacia identified and otherwise normal for age gray and white matter signal. Vascular: Major intracranial vascular flow voids are preserved, although the superior sagittal sinus is partially invaded and narrow it by the vertex lesion (series 17 images 4 through 6). The left vertebral artery is dominant. The other major dural venous sinuses are enhancing and appear to be patent. Skull and upper cervical spine: Posterior skull vertex bone lesion described above. Elsewhere visible bone marrow signal is within normal limits. Negative visible cervical spine aside from degenerative changes. Sinuses/Orbits: Negative; prior cataract surgery. Other: Mastoids are clear. There is subtle nodularity and enhancement in the left internal auditory canal (series 10, image 6 and series 19, image 15. This is 2-3 millimeters. The other visible internal auditory structures, including the left cochlea and vestibule appear grossly normal. Normal stylomastoid foramina. Abnormal scalp invasion over the posterior skull vertex as stated above. Other scalp and face soft tissues appear negative. IMPRESSION: 1.  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 No associated cerebral edema. No leptomeningeal involvement or direct brain invasion is suspected. The lesion invades and narrows the superior sagittal sinus, but the sinus remains patent. A 6-7 cm diameter area of the bone and dura appears infiltrated, with the remaining confluent extraosseous component encompassing 2-3 cm. Top differential considerations include metastasis, meningioma, and lymphoma. 2. 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 Dedicated IAC protocol brain MRI would better characterize this lesion in the future. 3.  No other bone lesion and no other acute or neoplastic process identified in the head. There are occasional chronic cerebral microhemorrhages. Electronically Signed   By: Genevie Ann M.D.   On: 06/24/2018 06:24   US Thyroid  Result Date: 06/25/2018 CLINICAL DATA:  Other.  Intracranial mass. EXAM: THYROID ULTRASOUND TECHNIQUE: Ultrasound examination of the thyroid gland and adjacent soft tissues was performed. COMPARISON:  None. FINDINGS: Parenchymal Echotexture: Mildly heterogenous Isthmus: 0.1 cm Right lobe: 2.8 x 1.5 x 1.3 cm Left lobe: 3.2 x 1.5 x 1.3 cm _________________________________________________________ Estimated total number of nodules >/= 1 cm: 1 Number of spongiform nodules >/=  2 cm not described below (TR1): 0 Number of mixed cystic and solid nodules >/= 1.5 cm not described below (TR2): 0 _________________________________________________________ Nodule # 1: Location: Left; Superior Maximum size: 1.4 cm; Other 2 dimensions: 0.5 x 0.8 cm Composition: solid/almost completely solid (2) Echogenicity: hypoechoic (2) Shape: not taller-than-wide (0) Margins: ill-defined (0) Echogenic foci: none (0) ACR TI-RADS total points: 4. ACR TI-RADS risk category: TR4 (4-6 points). ACR TI-RADS recommendations: *Given size (>/= 1 - 1.4 cm) and appearance, a follow-up ultrasound in 1 year should be considered based on TI-RADS criteria. _________________________________________________________ Two other hyperechoic areas measured in both right and left lobes do not have the appearance of discrete nodules and are consistent with hyperechoic islands of tissue that appear benign (white knight nodules). No abnormal lymph nodes identified. IMPRESSION: 1.4 cm left superior hypoechoic nodule meets criteria for 1 year follow-up ultrasound. The above is in keeping with the ACR TI-RADS recommendations - J Am Coll Radiol 2017;14:587-595. Electronically Signed   By: Aletta Edouard M.D.   On: 06/25/2018 08:08      LOS: 2 days    Time spent: More than 50% of that time was spent in counseling and/or coordination of care.  Antonieta Pert, MD [facility]  06/25/2018, 12:45 PM

## 2018-06-26 ENCOUNTER — Other Ambulatory Visit: Payer: Self-pay | Admitting: Neurology

## 2018-06-26 DIAGNOSIS — G9389 Other specified disorders of brain: Secondary | ICD-10-CM

## 2018-06-26 LAB — PROTIME-INR
INR: 1.2 (ref 0.8–1.2)
Prothrombin Time: 14.7 seconds (ref 11.4–15.2)

## 2018-06-26 LAB — CBC
HCT: 36.8 % — ABNORMAL LOW (ref 39.0–52.0)
Hemoglobin: 12.5 g/dL — ABNORMAL LOW (ref 13.0–17.0)
MCH: 34.2 pg — ABNORMAL HIGH (ref 26.0–34.0)
MCHC: 34 g/dL (ref 30.0–36.0)
MCV: 100.5 fL — ABNORMAL HIGH (ref 80.0–100.0)
Platelets: 143 10*3/uL — ABNORMAL LOW (ref 150–400)
RBC: 3.66 MIL/uL — ABNORMAL LOW (ref 4.22–5.81)
RDW: 14.7 % (ref 11.5–15.5)
WBC: 8.7 10*3/uL (ref 4.0–10.5)
nRBC: 0 % (ref 0.0–0.2)

## 2018-06-26 LAB — HEPARIN LEVEL (UNFRACTIONATED)
Heparin Unfractionated: 0.19 IU/mL — ABNORMAL LOW (ref 0.30–0.70)
Heparin Unfractionated: 0.43 IU/mL (ref 0.30–0.70)
Heparin Unfractionated: 0.48 IU/mL (ref 0.30–0.70)

## 2018-06-26 MED ORDER — HEART ATTACK BOUNCING BOOK
Freq: Once | Status: AC
  Administered 2018-06-26: 05:00:00

## 2018-06-26 MED ORDER — ANGIOPLASTY BOOK
Freq: Once | Status: AC
  Administered 2018-06-26: 05:00:00

## 2018-06-26 NOTE — Progress Notes (Signed)
PROGRESS NOTE    Nicholas Webb  UDJ:[id number] DOB: 12/01/1940 DOA: 06/23/2018 PCP: Maurice Small, MD   Brief Narrative: 78 y.o. male  W/ hc of Barrets/GERD ,steoarthritis/RA, hyperlipidemia came to the hospital with complains of chest pain that started While he was cutting grass 4/22, substernal, squeezing in nature without associated shortness of breath.  After rest pain resolved but would come back with activity.  Patient also felt lightheaded dizzy with presyncopal episode on the morning of 4/23. recently as outpatient has been evaluated by a neurologist for headaches and has been labeled as tension headache.  Currently he is not having 1.  In the ER his troponins were elevated at 0.3.  EKG did not show any acute ST-T changes but there was concerns of non-STEMI therefore cardiology team was consulted and started on heparin drip.  CT of the head showedAbnormal destructive skull lesion. Patient was admitted underwent cardiac cath with stent placement.  For his abnormal CT scan MRI brain seen by neurosurgery recommended to continue work-up for underlying malignancy  4/25: CT chest showed bilateral pulmonary embolism, patient placed back on heparin drip  subjective: Seen this morning no new complaints, on room air, denies chest pain shortness of breath.  Later in the room.  Assessment & Plan:  NSTEMI status post cath with proximal LAD lesion 99% stenosed, s/p DES advised to continue aspirin/Brilinta, statin, beta-blocker . appreciate cardio input.  Syncope: Unclear etiology.  Could be secondary to patient's PE. He is being scheduled for outpatient cardiac monitoring device. 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 The lesion invades and narrows the superior sagittal sinus, but the sinus remains patent. A 6-7 cm diameter area of the bone and dura appears infiltrated, with the remaining confluent extraosseous component encompassing 2-3 cm.Top differential considerations include metastasis, meningioma, and lymphoma." Neurosurgery consulted, appreciate input-psa is normal, thyroid USw nodule needing 1 y follow up. CT chest/abd/pelvis done. No obvious primary.  Discussed with on call oncology again today and they will evaluate the patient and will await further recommendation. Continue on current heparin drip.  Acute bilateral PE w large clot burden seen in CTA incidentally distal right main pulmonary:Discussed w Dr Margaretann Loveless from cardio- cont heparin gtt for now and recommends to cont triple therapy for 1 month then drop aspirin and after 1 year switch brilinta to aspirin. Spoke w Neurosurgery team [place]: they are okay to anticoagualtion as above as it is skull mass and they plan to follow as outpatient on non-emergent basis for skull mass. Discussed risks/benefits w patient and agrees with current therapy.  RA,stable on humira and methotrexate.  Severe chronic pulmonary fibrosis incidentally noted in CTA chest. w RA.  GERD w hx of Barrets esophagitis: c/w ppi.  Generalized OA:prn tylenol.  HLD added statins.  DVT prophylaxis: heparin  Code Status: full Family Communication: Discussed the plan of care with the patient in detail, reviewed imaging, labs, ct findings. I had called and updated his daughter who is a PA w [employer] int medicine 4/25. Disposition Plan:  Remains inpatient.   Consultants:  Neurosurgery Cardiology Oncology  Procedures: [facility] 06/24/18  Mid RCA lesion is 20% stenosed.  Prox Cx to Mid Cx lesion is 20%  stenosed.  Prox LAD lesion is 99% stenosed.  A drug-eluting stent was successfully placed using a STENT RESOLUTE ONYX 3.0X18.  Post intervention, there is a 0% residual stenosis.  Ost 1st Diag lesion is 80% stenosed.  1. Severe stenosis proximal to mid LAD 2. Successful PTCA/DES x 1 proximal LAD 3. Mild non-obstructive disease in the Circumflex and RCA  CTA Chest/Abdomen pelvis  1 Acute pulmonary emboli involving the distal right main pulmonary artery, the interlobar pulmonary artery, the RIGHT UPPER LOBE pulmonary artery, the RIGHT MIDDLE and RIGHT LOWER LOBE pulmonary arteries and their branches, the LEFT LOWER LOBE pulmonary artery and its branches, and minimally in the LEFT UPPER LOBE pulmonary artery. Clot burden is large. No evidence of right heart strain currently as the RV/LV ratio is approximately 0.86. 2. A primary malignancy is not clearly identified on the current examination. 3. Necrotic approximate 2 cm right paraesophageal mediastinal lymph node and approximate 2 cm left paratracheal mediastinal lymph node. No pathologic lymphadenopathy elsewhere in the chest, abdomen or pelvis. 4. Approximate 8 mm pleural based nodule in the lateral aspect of the SUPERIOR segment LEFT LOWER LOBE and approximate 4 mm nodule in the LEFT UPPER LOBE. No nodules elsewhere in either lung. 5. Severe chronic pulmonary fibrosis. No acute cardiopulmonary disease. 6. No acute abnormalities involving the abdomen or pelvis. 7. Diffuse hepatic steatosis without evidence of hepatic mass. 8. Elongated and tortuous sigmoid colon without evidence of a colonic mass.   Antimicrobials: Anti-infectives (From admission, onward)   None       Objective: Vitals:   06/25/18 1505 06/25/18 2036 06/25/18 2126 06/26/18 0459  BP: 130/67 123/78  111/64  Pulse: 65 (!) 109 78 62  Resp:  20  18  Temp: 97.9 F (36.6 C) (!) 97.5 F (36.4 C)  97.9 F (36.6 C)  TempSrc: Oral Oral  Oral  SpO2: 95%  95%    Weight:    81.7 kg  Height:        Intake/Output Summary (Last 24 hours) at 06/26/2018 1029 Last data filed at 06/25/2018 1800 Gross per 24 hour  Intake 5.85 ml  Output --  Net 5.85 ml   Filed Weights   06/24/18 0402 06/25/18 0536 06/26/18 0459  Weight: 81.6 kg 82.1 kg 81.7 kg   Weight change: -0.454 kg  Body mass index is 26.6 kg/m.  Intake/Output from previous day: 04/25 0701 - 04/26 0700 In: 245.9 [P.O.:240; I.V.:5.9] Out: 200 [Urine:200] Intake/Output this shift: No intake/output data recorded.  Examination:  General exam: Calm, comfortable, not in acute distress,. HEENT:Oral mucosa moist, Ear/Nose WNL grossly, dentition normal. Respiratory system: Bilateral equal air entry, no crackles and wheezing, no use of accessory muscle, non tender on palpation. Cardiovascular system:Regular rate and rhythm, S1 & S2 heard, No JVD/murmurs. Gastrointestinal system:Abdomen soft, non-tender, non-distended, BS +. No hepatosplenomegaly palpable. Nervous System:Alert, awake and oriented at baseline. Able to move UE and LE, sensation intact. Extremities:No edema, distal peripheral pulses palpable.  Skin:No rashes,no icterus. ELF:YBOFBP muscle bulk,tone, power  Medications:  Scheduled Meds:  aspirin EC  81 mg Oral Daily   atorvastatin  80 mg Oral q1800   chlorhexidine  15 mL Mouth Rinse BID   folic acid  2 mg Oral Daily   mouth rinse  15 mL Mouth Rinse q12n4p   metoprolol tartrate  12.5 mg Oral BID   pantoprazole  40 mg Oral Daily  sodium chloride flush  3 mL Intravenous Q12H   ticagrelor  90 mg Oral BID   tiZANidine  2 mg Oral QHS   Continuous Infusions:  sodium chloride 100 mL/hr at 06/24/18 9470   sodium chloride     heparin 1,250 Units/hr (06/26/18 0030)    Data Reviewed: I have personally reviewed following labs and imaging studies  CBC: Recent Labs  Lab 06/23/18 1701 06/24/18 0234 06/25/18 0403 06/26/18 0453  WBC 7.9 10.1 9.5 8.7   NEUTROABS 5.7  --   --   --   HGB 13.4 11.6* 11.7* 12.5*  HCT 41.4 34.8* 35.0* 36.8*  MCV 103.5* 102.1* 101.7* 100.5*  PLT 158 150 136* 962*   Basic Metabolic Panel: Recent Labs  Lab 06/23/18 1701 06/24/18 0234 06/25/18 0403  NA 140 140 139  K 4.2 4.0 3.9  CL 103 107 107  CO2 22 23 24   GLUCOSE 135* 111* 106*  BUN 22 25* 14  CREATININE 1.04 1.00 0.85  CALCIUM 9.5 8.9 8.9  MG 2.0 1.9  --    GFR: Estimated Creatinine Clearance: 73.9 mL/min (by C-G formula based on SCr of 0.85 mg/dL). Liver Function Tests: Recent Labs  Lab 06/24/18 0234  AST 40  ALT 49*  ALKPHOS 67  BILITOT 0.8  PROT 6.4*  ALBUMIN 3.2*   No results for input(s): LIPASE, AMYLASE in the last 168 hours. No results for input(s): AMMONIA in the last 168 hours. Coagulation Profile: Recent Labs  Lab 06/26/18 0453  INR 1.2   Cardiac Enzymes: Recent Labs  Lab 06/23/18 1701 06/23/18 1932 06/24/18 0234 06/24/18 0649  TROPONINI 0.32* 0.33* 0.25* 0.17*   BNP (last 3 results) No results for input(s): PROBNP in the last 8760 hours. HbA1C: Recent Labs    06/23/18 1933  HGBA1C 5.9*   CBG: No results for input(s): GLUCAP in the last 168 hours. Lipid Profile: Recent Labs    06/23/18 1933  CHOL 155  HDL 45  LDLCALC 103*  TRIG 36  CHOLHDL 3.4   Thyroid Function Tests: Recent Labs    06/23/18 1933  TSH 2.365   Anemia Panel: No results for input(s): VITAMINB12, FOLATE, FERRITIN, TIBC, IRON, RETICCTPCT in the last 72 hours. Sepsis Labs: No results for input(s): PROCALCITON, LATICACIDVEN in the last 168 hours.  No results found for this or any previous visit (from the past 240 hour(s)).    Radiology Studies: Ct Chest W Contrast  Result Date: 06/25/2018 CLINICAL DATA:  78 year old with a mass involving the skull with extension into the leptomeninges and the scalp, possibly a metastasis. This examination is performed in order to potentially identify a primary malignancy. EXAM: CT CHEST,  ABDOMEN, AND PELVIS WITH CONTRAST TECHNIQUE: Multidetector CT imaging of the chest, abdomen and pelvis was performed following the standard protocol during bolus administration of intravenous contrast. Oral contrast was also administered. CONTRAST:  116mL OMNIPAQUE IOHEXOL 300 MG/ML IV. COMPARISON:  No prior chest or abdominopelvic CT. The MRI brain performed yesterday is correlated. FINDINGS: CT CHEST FINDINGS Cardiovascular: Normal heart size. LAD coronary stent. No pericardial effusion. Filling defects within the distal RIGHT main pulmonary artery extending into the RIGHT UPPER LOBE pulmonary artery, the intralobar pulmonary artery and branches of the RIGHT MIDDLE LOBE and RIGHT LOWER LOBE pulmonary artery. Filling defects within the proximal LEFT UPPER LOBE pulmonary artery and the distal LEFT LOWER LOBE pulmonary artery and several of its branches. No evidence of RIGHT heart strain currently as the RV/LV ratio measures approximately 0.86. Mild atherosclerosis  involving the thoracic aorta without evidence of aneurysm. Visualized proximal great vessels widely patent. Mediastinum/Nodes: Necrotic approximate 1.1 x 2.0 cm RIGHT paraesophageal mediastinal lymph node (station 8), series 3, image 33. Approximate 1.6 x 2.1 cm LEFT paratracheal mediastinal node (station 4 L), series 3, image 22. Multiple normal sized lymph nodes elsewhere throughout the mediastinum. Normal sized BILATERAL hilar lymph nodes. Normal axillary lymph nodes. No visible supraclavicular lymphadenopathy. Normal appearing esophagus. Normal-appearing thyroid gland. Lungs/Pleura: Severe changes of peripheral pulmonary fibrosis throughout both lungs, with LOWER zonal predominance. Approximate 4 x 5 mm (5 mm mean) nodule in the LEFT UPPER LOBE on image 38 of series 5. Approximate 8 x 8 mm (8 mm mean) pleural based nodule laterally in the SUPERIOR segment LEFT LOWER LOBE on image 67 of series 5. No parenchymal nodules or masses elsewhere in either  lung. No confluent or ground-glass airspace consolidation in either lung. No pleural effusions. Central airways patent. Mild BILATERAL lower lobe bronchiectasis. Musculoskeletal: Degenerative disc disease and spondylosis involving the visualized LOWER cervical spine and throughout the thoracic spine. No acute findings. No evidence of osseous metastatic disease. CT ABDOMEN PELVIS FINDINGS Hepatobiliary: Diffuse hepatic steatosis without evidence of a hepatic mass. Gallbladder normal in appearance without calcified gallstones. No biliary ductal dilation. Pancreas: Normal in appearance without evidence of mass, ductal dilation, or inflammation. Spleen: Normal in size and appearance. Adrenals/Urinary Tract: Normal appearing adrenal glands. Subcentimeter cortical cysts involving both kidneys. No solid renal masses. No urinary tract calculi. No hydronephrosis. Normal appearing urinary bladder. Stomach/Bowel: Stomach normal in appearance for the degree of distention. Normal-appearing small bowel. The sigmoid colon is elongated and tortuous and extends well up into the RIGHT UPPER QUADRANT of the abdomen beneath the RIGHT hemidiaphragm. Moderate stool burden is present throughout the colon. A colonic mass is not identified. Appendix is not conspicuous but there is no pericecal inflammation. Vascular/Lymphatic: Mild aortoiliofemoral atherosclerosis without evidence of aneurysm. Normal-appearing portal venous and systemic venous systems. No pathologic lymphadenopathy. Reproductive: Mild median lobe prostate gland enlargement. Scattered prostatic calcifications. Normal seminal vesicles. Other: Phleboliths low in both sides of the pelvis. Musculoskeletal: Lumbar levoscoliosis. Multilevel degenerative disc disease, spondylosis and facet degenerative changes. No acute findings. No evidence of osseous metastatic disease. IMPRESSION: 1. Acute pulmonary emboli involving the distal right main pulmonary artery, the interlobar  pulmonary artery, the RIGHT UPPER LOBE pulmonary artery, the RIGHT MIDDLE and RIGHT LOWER LOBE pulmonary arteries and their branches, the LEFT LOWER LOBE pulmonary artery and its branches, and minimally in the LEFT UPPER LOBE pulmonary artery. Clot burden is large. No evidence of right heart strain currently as the RV/LV ratio is approximately 0.86. 2. A primary malignancy is not clearly identified on the current examination. 3. Necrotic approximate 2 cm right paraesophageal mediastinal lymph node and approximate 2 cm left paratracheal mediastinal lymph node. No pathologic lymphadenopathy elsewhere in the chest, abdomen or pelvis. 4. Approximate 8 mm pleural based nodule in the lateral aspect of the SUPERIOR segment LEFT LOWER LOBE and approximate 4 mm nodule in the LEFT UPPER LOBE. No nodules elsewhere in either lung. 5. Severe chronic pulmonary fibrosis. No acute cardiopulmonary disease. 6. No acute abnormalities involving the abdomen or pelvis. 7. Diffuse hepatic steatosis without evidence of hepatic mass. 8. Elongated and tortuous sigmoid colon without evidence of a colonic mass. Aortic Atherosclerosis (ICD10-170.0) One might consider biopsy of the skull mass in order to establish the source of the malignancy. These critical/emergent results were discussed directly by telephone with the hospitalist caring for  the patient at the time of interpretation on 06/25/2018 at 4:12 p.m. Electronically Signed   By: Evangeline Dakin M.D.   On: 06/25/2018 16:19   Ct Abdomen Pelvis W Contrast  Result Date: 06/25/2018 CLINICAL DATA:  78 year old with a mass involving the skull with extension into the leptomeninges and the scalp, possibly a metastasis. This examination is performed in order to potentially identify a primary malignancy. EXAM: CT CHEST, ABDOMEN, AND PELVIS WITH CONTRAST TECHNIQUE: Multidetector CT imaging of the chest, abdomen and pelvis was performed following the standard protocol during bolus  administration of intravenous contrast. Oral contrast was also administered. CONTRAST:  174mL OMNIPAQUE IOHEXOL 300 MG/ML IV. COMPARISON:  No prior chest or abdominopelvic CT. The MRI brain performed yesterday is correlated. FINDINGS: CT CHEST FINDINGS Cardiovascular: Normal heart size. LAD coronary stent. No pericardial effusion. Filling defects within the distal RIGHT main pulmonary artery extending into the RIGHT UPPER LOBE pulmonary artery, the intralobar pulmonary artery and branches of the RIGHT MIDDLE LOBE and RIGHT LOWER LOBE pulmonary artery. Filling defects within the proximal LEFT UPPER LOBE pulmonary artery and the distal LEFT LOWER LOBE pulmonary artery and several of its branches. No evidence of RIGHT heart strain currently as the RV/LV ratio measures approximately 0.86. Mild atherosclerosis involving the thoracic aorta without evidence of aneurysm. Visualized proximal great vessels widely patent. Mediastinum/Nodes: Necrotic approximate 1.1 x 2.0 cm RIGHT paraesophageal mediastinal lymph node (station 8), series 3, image 33. Approximate 1.6 x 2.1 cm LEFT paratracheal mediastinal node (station 4 L), series 3, image 22. Multiple normal sized lymph nodes elsewhere throughout the mediastinum. Normal sized BILATERAL hilar lymph nodes. Normal axillary lymph nodes. No visible supraclavicular lymphadenopathy. Normal appearing esophagus. Normal-appearing thyroid gland. Lungs/Pleura: Severe changes of peripheral pulmonary fibrosis throughout both lungs, with LOWER zonal predominance. Approximate 4 x 5 mm (5 mm mean) nodule in the LEFT UPPER LOBE on image 38 of series 5. Approximate 8 x 8 mm (8 mm mean) pleural based nodule laterally in the SUPERIOR segment LEFT LOWER LOBE on image 67 of series 5. No parenchymal nodules or masses elsewhere in either lung. No confluent or ground-glass airspace consolidation in either lung. No pleural effusions. Central airways patent. Mild BILATERAL lower lobe bronchiectasis.  Musculoskeletal: Degenerative disc disease and spondylosis involving the visualized LOWER cervical spine and throughout the thoracic spine. No acute findings. No evidence of osseous metastatic disease. CT ABDOMEN PELVIS FINDINGS Hepatobiliary: Diffuse hepatic steatosis without evidence of a hepatic mass. Gallbladder normal in appearance without calcified gallstones. No biliary ductal dilation. Pancreas: Normal in appearance without evidence of mass, ductal dilation, or inflammation. Spleen: Normal in size and appearance. Adrenals/Urinary Tract: Normal appearing adrenal glands. Subcentimeter cortical cysts involving both kidneys. No solid renal masses. No urinary tract calculi. No hydronephrosis. Normal appearing urinary bladder. Stomach/Bowel: Stomach normal in appearance for the degree of distention. Normal-appearing small bowel. The sigmoid colon is elongated and tortuous and extends well up into the RIGHT UPPER QUADRANT of the abdomen beneath the RIGHT hemidiaphragm. Moderate stool burden is present throughout the colon. A colonic mass is not identified. Appendix is not conspicuous but there is no pericecal inflammation. Vascular/Lymphatic: Mild aortoiliofemoral atherosclerosis without evidence of aneurysm. Normal-appearing portal venous and systemic venous systems. No pathologic lymphadenopathy. Reproductive: Mild median lobe prostate gland enlargement. Scattered prostatic calcifications. Normal seminal vesicles. Other: Phleboliths low in both sides of the pelvis. Musculoskeletal: Lumbar levoscoliosis. Multilevel degenerative disc disease, spondylosis and facet degenerative changes. No acute findings. No evidence of osseous metastatic disease. IMPRESSION: 1.  Acute pulmonary emboli involving the distal right main pulmonary artery, the interlobar pulmonary artery, the RIGHT UPPER LOBE pulmonary artery, the RIGHT MIDDLE and RIGHT LOWER LOBE pulmonary arteries and their branches, the LEFT LOWER LOBE pulmonary artery  and its branches, and minimally in the LEFT UPPER LOBE pulmonary artery. Clot burden is large. No evidence of right heart strain currently as the RV/LV ratio is approximately 0.86. 2. A primary malignancy is not clearly identified on the current examination. 3. Necrotic approximate 2 cm right paraesophageal mediastinal lymph node and approximate 2 cm left paratracheal mediastinal lymph node. No pathologic lymphadenopathy elsewhere in the chest, abdomen or pelvis. 4. Approximate 8 mm pleural based nodule in the lateral aspect of the SUPERIOR segment LEFT LOWER LOBE and approximate 4 mm nodule in the LEFT UPPER LOBE. No nodules elsewhere in either lung. 5. Severe chronic pulmonary fibrosis. No acute cardiopulmonary disease. 6. No acute abnormalities involving the abdomen or pelvis. 7. Diffuse hepatic steatosis without evidence of hepatic mass. 8. Elongated and tortuous sigmoid colon without evidence of a colonic mass. Aortic Atherosclerosis (ICD10-170.0) One might consider biopsy of the skull mass in order to establish the source of the malignancy. These critical/emergent results were discussed directly by telephone with the hospitalist caring for the patient at the time of interpretation on 06/25/2018 at 4:12 p.m. Electronically Signed   By: Evangeline Dakin M.D.   On: 06/25/2018 16:19   US Thyroid  Result Date: 06/25/2018 CLINICAL DATA:  Other.  Intracranial mass. EXAM: THYROID ULTRASOUND TECHNIQUE: Ultrasound examination of the thyroid gland and adjacent soft tissues was performed. COMPARISON:  None. FINDINGS: Parenchymal Echotexture: Mildly heterogenous Isthmus: 0.1 cm Right lobe: 2.8 x 1.5 x 1.3 cm Left lobe: 3.2 x 1.5 x 1.3 cm _________________________________________________________ Estimated total number of nodules >/= 1 cm: 1 Number of spongiform nodules >/=  2 cm not described below (TR1): 0 Number of mixed cystic and solid nodules >/= 1.5 cm not described below (TR2): 0  _________________________________________________________ Nodule # 1: Location: Left; Superior Maximum size: 1.4 cm; Other 2 dimensions: 0.5 x 0.8 cm Composition: solid/almost completely solid (2) Echogenicity: hypoechoic (2) Shape: not taller-than-wide (0) Margins: ill-defined (0) Echogenic foci: none (0) ACR TI-RADS total points: 4. ACR TI-RADS risk category: TR4 (4-6 points). ACR TI-RADS recommendations: *Given size (>/= 1 - 1.4 cm) and appearance, a follow-up ultrasound in 1 year should be considered based on TI-RADS criteria. _________________________________________________________ Two other hyperechoic areas measured in both right and left lobes do not have the appearance of discrete nodules and are consistent with hyperechoic islands of tissue that appear benign (white knight nodules). No abnormal lymph nodes identified. IMPRESSION: 1.4 cm left superior hypoechoic nodule meets criteria for 1 year follow-up ultrasound. The above is in keeping with the ACR TI-RADS recommendations - J Am Coll Radiol 2017;14:587-595. Electronically Signed   By: Aletta Edouard M.D.   On: 06/25/2018 08:08     LOS: 3 days   Time spent: More than 50% of that time was spent in counseling and/or coordination of care.  Antonieta Pert, MD [facility]  06/26/2018, 10:29 AM

## 2018-06-26 NOTE — Progress Notes (Signed)
ANTICOAGULATION CONSULT NOTE  Pharmacy Consult for Heparin Indication: PE  No Known Allergies  Patient Measurements: Height: 5\' 9"  (175.3 cm) Weight: 180 lb 1.6 oz (81.7 kg) IBW/kg (Calculated) : 70.7 Heparin Dosing Weight: 87kg  Vital Signs: Temp: 98.1 F (36.7 C) (04/26 1348) Temp Source: Oral (04/26 1348) BP: 105/66 (04/26 1348) Pulse Rate: 57 (04/26 1348)  Labs: Recent Labs    06/23/18 1932  06/24/18 0234 06/24/18 3382  06/25/18 0403 06/25/18 2332 06/26/18 0453 06/26/18 0927 06/26/18 1653  HGB  --    < > 11.6*  --   --  11.7*  --  12.5*  --   --   HCT  --   --  34.8*  --   --  35.0*  --  36.8*  --   --   PLT  --   --  150  --   --  136*  --  143*  --   --   LABPROT  --   --   --   --   --   --   --  14.7  --   --   INR  --   --   --   --   --   --   --  1.2  --   --   HEPARINUNFRC  --   --  0.48  --    < >  --  0.19*  --  0.48 0.43  CREATININE  --   --  1.00  --   --  0.85  --   --   --   --   TROPONINI 0.33*  --  0.25* 0.17*  --   --   --   --   --   --    < > = values in this interval not displayed.    Estimated Creatinine Clearance: 73.9 mL/min (by C-G formula based on SCr of 0.85 mg/dL).  Medical History: Past Medical History:  Diagnosis Date  . Anemia of chronic disease   . Atherosclerosis of both carotid arteries   . Barrett's esophagus   . Dizziness   . GERD (gastroesophageal reflux disease)   . Hx of adenomatous colonic polyps   . Hyperlipidemia   . Macrocytosis without anemia   . Obesity   . OSA (obstructive sleep apnea)   . Rheumatoid arthritis(714.0)   . Syncope    Assessment: Nicholas Webb s/p NSTEMI and cath with DES placed.. Pharmacy consulted for heparin dosing pending cath on 4/24.  CT now shows PE and pleural nodules. Pharmacy consulted to restart heparin. Patient noted on ASA and ticagrelor  Heparin level came back therapeutic at 0.43, on 1250 units/hr. Hgb 12.5, plt 143. No s/sx of bleeding. No infusion issues.   Goal of Therapy:   Heparin level 0.3-0.7 units/ml Monitor platelets by anticoagulation protocol: Yes   Plan:  -Continue IV heparin at current rate. -Daily heparin level and CBC -F/u plans for DOAC once invasive procedures over.  Antonietta Jewel, PharmD, [place] Clinical Pharmacist  Pager: [phone] Phone: [phone]  06/26/2018 5:45 PM

## 2018-06-26 NOTE — Consult Note (Signed)
New Hematology/Oncology Consult   Requesting WS:[facility]       Reason for Consult: Pulmonary emboli, chest lymphadenopathy, skull mass  HPI: Nicholas Webb was admitted on 06/23/2018 with chest pain and presyncope.  A CT of the skull revealed a destructive lesion in the left parasagittal parietal bone.  A smaller lesion was noted in the midline occipital bone.  An MRI of the brain on 06/24/2018 revealed a lesion at the posterior skull vertex replacing the normal marrow.  Extraosseous extension was noted intracranially and superficially into the scalp.  There is associated focal pachymeningeal thickening at the posterior superior sagittal sinus.  No leptomeningeal thickening.  No abnormal parenchymal enhancement of the brain.  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 CTs of the chest, abdomen, and pelvis on 06/25/2018 revealed acute pulmonary emboli in the distal right main pulmonary artery and throughout the right lung.  There is also embolism in the left lower and upper lobe pulmonary arteries.  No evidence of right heart strain.  Necrotic right paraesophageal node and 2 cm left paratracheal node.  Small nodules at the pleura superior segment of the left lower lobe and in the left upper lobe.  Chronic pulmonary fibrosis.  He was placed on heparin anticoagulation.  He was evaluated by cardiology.  A cardiac catheterization 06/24/2018 revealed a 99% stenosis of the proximal LAD  He has no complaint today.    Past Medical History:  Diagnosis Date  . Anemia of chronic disease   . Atherosclerosis of both carotid arteries   . Barrett's esophagus   .    Marland Kitchen GERD (gastroesophageal reflux disease)   . Hx of adenomatous colonic polyps   . Hyperlipidemia   . Macrocytosis without anemia   . Obesity   . OSA (obstructive sleep apnea)   . Rheumatoid arthritis(714.0)   . Syncope   :  .  History of premalignant skin lesions  Past Surgical History:  Procedure  Laterality Date  . JOINT REPLACEMENT  2004   left  :   Current Facility-Administered Medications:  .  0.9 %  sodium chloride infusion, , Intravenous, Continuous, Burnell Blanks, MD, Last Rate: 100 mL/hr at 06/24/18 5170 .  0.9 %  sodium chloride infusion, 250 mL, Intravenous, PRN, Burnell Blanks, MD .  acetaminophen (TYLENOL) tablet 650 mg, 650 mg, Oral, Q6H PRN **OR** acetaminophen (TYLENOL) suppository 650 mg, 650 mg, Rectal, Q6H PRN, Burnell Blanks, MD .  aspirin EC tablet 81 mg, 81 mg, Oral, Daily, Burnell Blanks, MD, 81 mg at 06/26/18 1001 .  atorvastatin (LIPITOR) tablet 80 mg, 80 mg, Oral, q1800, Burnell Blanks, MD, 80 mg at 06/26/18 1718 .  bisacodyl (DULCOLAX) EC tablet 5 mg, 5 mg, Oral, Daily PRN, Burnell Blanks, MD .  chlorhexidine (PERIDEX) 0.12 % solution 15 mL, 15 mL, Mouth Rinse, BID, Burnell Blanks, MD, 15 mL at 06/26/18 1002 .  folic acid (FOLVITE) tablet 2 mg, 2 mg, Oral, Daily, Burnell Blanks, MD, 2 mg at 06/26/18 1001 .  heparin ADULT infusion 100 units/mL (25000 units/265mL sodium chloride 0.45%), 1,250 Units/hr, Intravenous, Continuous, Erenest Blank, RPH, Last Rate: 12.5 mL/hr at 06/26/18 1145, 1,250 Units/hr at 06/26/18 1145 .  hydrALAZINE (APRESOLINE) injection 10 mg, 10 mg, Intravenous, Q4H PRN, Burnell Blanks, MD .  HYDROcodone-acetaminophen (NORCO/VICODIN) 5-325 MG per tablet 1-2 tablet, 1-2 tablet, Oral, Q4H PRN, Burnell Blanks, MD .  MEDLINE mouth rinse, 15 mL,  Mouth Rinse, q12n4p, Burnell Blanks, MD .  metoprolol tartrate (LOPRESSOR) tablet 12.5 mg, 12.5 mg, Oral, BID, Kc, Ramesh, MD, 12.5 mg at 06/26/18 1001 .  ondansetron (ZOFRAN) tablet 4 mg, 4 mg, Oral, Q6H PRN **OR** ondansetron (ZOFRAN) injection 4 mg, 4 mg, Intravenous, Q6H PRN, Burnell Blanks, MD .  pantoprazole (PROTONIX) EC tablet 40 mg, 40 mg, Oral, Daily, Burnell Blanks, MD, 40 mg at  06/26/18 1001 .  senna-docusate (Senokot-S) tablet 1 tablet, 1 tablet, Oral, QHS PRN, Lauree Chandler D, MD .  sodium chloride flush (NS) 0.9 % injection 3 mL, 3 mL, Intravenous, Q12H, McAlhany, Christopher D, MD .  sodium chloride flush (NS) 0.9 % injection 3 mL, 3 mL, Intravenous, PRN, Burnell Blanks, MD, 3 mL at 06/25/18 2131 .  sodium phosphate (FLEET) 7-19 GM/118ML enema 1 enema, 1 enema, Rectal, Once PRN, Burnell Blanks, MD .  ticagrelor (BRILINTA) tablet 90 mg, 90 mg, Oral, BID, Burnell Blanks, MD, 90 mg at 06/26/18 1001 .  tiZANidine (ZANAFLEX) tablet 2 mg, 2 mg, Oral, QHS, Burnell Blanks, MD, 2 mg at 06/25/18 2127:  . aspirin EC  81 mg Oral Daily  . atorvastatin  80 mg Oral q1800  . chlorhexidine  15 mL Mouth Rinse BID  . folic acid  2 mg Oral Daily  . mouth rinse  15 mL Mouth Rinse q12n4p  . metoprolol tartrate  12.5 mg Oral BID  . pantoprazole  40 mg Oral Daily  . sodium chloride flush  3 mL Intravenous Q12H  . ticagrelor  90 mg Oral BID  . tiZANidine  2 mg Oral QHS  :  No Known Allergies:  FH: No family history of cancer  SOCIAL HISTORY: He lives in [place].  He is a retired Architect.  He smoked cigarettes briefly in the 1970s, rare alcohol use.  No risk factor for HIV or hepatitis.  Review of Systems:  Positives include: Weight loss, chest discomfort and presyncope on the day of admission  A complete ROS was otherwise negative.   Physical Exam:  Blood pressure 105/66, pulse (!) 57, temperature 98.1 F (36.7 C), temperature source Oral, resp. rate 18, height 5\' 9"  (1.753 m), weight 180 lb 1.6 oz (81.7 kg), SpO2 91 %.  HEENT: Oral cavity without visible mass, neck without mass Lungs: Decreased breath sounds with inspiratory rales at the lower posterior chest bilaterally, no respiratory distress Cardiac: Irregular Abdomen: No hepatosplenomegaly, nontender, no mass GU: Testes without mass Vascular: No leg edema Lymph  nodes: No cervical, supraclavicular, axillary, or inguinal nodes Neurologic: Alert and oriented, the motor exam appears intact in the upper and lower extremities bilaterally Skin: Multiple benign-appearing moles over the scalp and trunk Musculoskeletal: No palpable scalp mass, no scalp tenderness, no spine tenderness  LABS:  Recent Labs    06/25/18 0403 06/26/18 0453  WBC 9.5 8.7  HGB 11.7* 12.5*  HCT 35.0* 36.8*  PLT 136* 143*    Recent Labs    06/24/18 0234 06/25/18 0403  NA 140 139  K 4.0 3.9  CL 107 107  CO2 23 24  GLUCOSE 111* 106*  BUN 25* 14  CREATININE 1.00 0.85  CALCIUM 8.9 8.9      RADIOLOGY:  Dg Chest 2 View  Result Date: 06/23/2018 CLINICAL DATA:  Shortness of breath. EXAM: CHEST - 2 VIEW COMPARISON:  04/11/2017. FINDINGS: Borderline cardiomegaly. Pleuroparenchymal scarring bilaterally is stable. No consolidation or edema. No effusion or pneumothorax. Bones unremarkable. IMPRESSION: Stable chest.  Electronically Signed   By: Staci Righter M.D.   On: 06/23/2018 20:07   Ct Head Wo Contrast  Result Date: 06/23/2018 CLINICAL DATA:  Vertigo.  Recent fall.  History of BPPV. EXAM: CT HEAD WITHOUT CONTRAST TECHNIQUE: Contiguous axial images were obtained from the base of the skull through the vertex without intravenous contrast. COMPARISON:  CT head 08/12/2015. FINDINGS: Brain: No evidence for acute infarction, hemorrhage, mass lesion, hydrocephalus, or extra-axial fluid. Generalized atrophy. Hypoattenuation of white matter likely small vessel disease. In the LEFT parietal parasagittal vertex, there is a destructive lesion of the skull. There is both intracranial and extracranial extension, with a 3 mm thick epidural component seen best on sagittal series 6 image 33. Vascular: Calcification of the cavernous internal carotid arteries consistent with cerebrovascular atherosclerotic disease. No signs of intracranial large vessel occlusion. Skull: Destructive skull lesion,  LEFT parasagittal parietal bone, with largest confluent area destruction 15 x 18 x 14 mm. Lesion is contiguous with the superior sagittal sinus but I do not believe that it is venous in origin. A second lesion is seen more caudally in the midline, involving the occipital bone, again with intracranial extension. Subgaleal extension is most prominent at the LEFT parietal region. No fracture related to the recent fall. Sinuses/Orbits: Paranasal sinuses are clear. No orbital findings. Slight frontal soft tissue swelling. Other: Compared with previous CT from 2017, the skull was normal. IMPRESSION: 1. Destructive skull lesion, LEFT parasagittal parietal bone, with intracranial and extracranial extension. Smaller lesion also involving the occipital bone in the midline. The lesions are contiguous with the superior sagittal sinus but I do not favor that it is venous in origin. Concern for osseous spread of unknown malignancy. Aggressive pacchionian granulations cannot completely be excluded, but would appear unlikely given the normal skull just 3 years earlier. 2. Atrophy and small vessel disease. No acute intracranial findings. 3. No skull fracture or intracranial hemorrhage. 4. If further investigation desired, MRI of the brain without and with contrast recommended for further evaluation. Electronically Signed   By: Staci Righter M.D.   On: 06/23/2018 17:43   Ct Chest W Contrast  Result Date: 06/25/2018 CLINICAL DATA:  78 year old with a mass involving the skull with extension into the leptomeninges and the scalp, possibly a metastasis. This examination is performed in order to potentially identify a primary malignancy. EXAM: CT CHEST, ABDOMEN, AND PELVIS WITH CONTRAST TECHNIQUE: Multidetector CT imaging of the chest, abdomen and pelvis was performed following the standard protocol during bolus administration of intravenous contrast. Oral contrast was also administered. CONTRAST:  113mL OMNIPAQUE IOHEXOL 300 MG/ML IV.  COMPARISON:  No prior chest or abdominopelvic CT. The MRI brain performed yesterday is correlated. FINDINGS: CT CHEST FINDINGS Cardiovascular: Normal heart size. LAD coronary stent. No pericardial effusion. Filling defects within the distal RIGHT main pulmonary artery extending into the RIGHT UPPER LOBE pulmonary artery, the intralobar pulmonary artery and branches of the RIGHT MIDDLE LOBE and RIGHT LOWER LOBE pulmonary artery. Filling defects within the proximal LEFT UPPER LOBE pulmonary artery and the distal LEFT LOWER LOBE pulmonary artery and several of its branches. No evidence of RIGHT heart strain currently as the RV/LV ratio measures approximately 0.86. Mild atherosclerosis involving the thoracic aorta without evidence of aneurysm. Visualized proximal great vessels widely patent. Mediastinum/Nodes: Necrotic approximate 1.1 x 2.0 cm RIGHT paraesophageal mediastinal lymph node (station 8), series 3, image 33. Approximate 1.6 x 2.1 cm LEFT paratracheal mediastinal node (station 4 L), series 3, image 22. Multiple normal sized lymph  nodes elsewhere throughout the mediastinum. Normal sized BILATERAL hilar lymph nodes. Normal axillary lymph nodes. No visible supraclavicular lymphadenopathy. Normal appearing esophagus. Normal-appearing thyroid gland. Lungs/Pleura: Severe changes of peripheral pulmonary fibrosis throughout both lungs, with LOWER zonal predominance. Approximate 4 x 5 mm (5 mm mean) nodule in the LEFT UPPER LOBE on image 38 of series 5. Approximate 8 x 8 mm (8 mm mean) pleural based nodule laterally in the SUPERIOR segment LEFT LOWER LOBE on image 67 of series 5. No parenchymal nodules or masses elsewhere in either lung. No confluent or ground-glass airspace consolidation in either lung. No pleural effusions. Central airways patent. Mild BILATERAL lower lobe bronchiectasis. Musculoskeletal: Degenerative disc disease and spondylosis involving the visualized LOWER cervical spine and throughout the  thoracic spine. No acute findings. No evidence of osseous metastatic disease. CT ABDOMEN PELVIS FINDINGS Hepatobiliary: Diffuse hepatic steatosis without evidence of a hepatic mass. Gallbladder normal in appearance without calcified gallstones. No biliary ductal dilation. Pancreas: Normal in appearance without evidence of mass, ductal dilation, or inflammation. Spleen: Normal in size and appearance. Adrenals/Urinary Tract: Normal appearing adrenal glands. Subcentimeter cortical cysts involving both kidneys. No solid renal masses. No urinary tract calculi. No hydronephrosis. Normal appearing urinary bladder. Stomach/Bowel: Stomach normal in appearance for the degree of distention. Normal-appearing small bowel. The sigmoid colon is elongated and tortuous and extends well up into the RIGHT UPPER QUADRANT of the abdomen beneath the RIGHT hemidiaphragm. Moderate stool burden is present throughout the colon. A colonic mass is not identified. Appendix is not conspicuous but there is no pericecal inflammation. Vascular/Lymphatic: Mild aortoiliofemoral atherosclerosis without evidence of aneurysm. Normal-appearing portal venous and systemic venous systems. No pathologic lymphadenopathy. Reproductive: Mild median lobe prostate gland enlargement. Scattered prostatic calcifications. Normal seminal vesicles. Other: Phleboliths low in both sides of the pelvis. Musculoskeletal: Lumbar levoscoliosis. Multilevel degenerative disc disease, spondylosis and facet degenerative changes. No acute findings. No evidence of osseous metastatic disease. IMPRESSION: 1. Acute pulmonary emboli involving the distal right main pulmonary artery, the interlobar pulmonary artery, the RIGHT UPPER LOBE pulmonary artery, the RIGHT MIDDLE and RIGHT LOWER LOBE pulmonary arteries and their branches, the LEFT LOWER LOBE pulmonary artery and its branches, and minimally in the LEFT UPPER LOBE pulmonary artery. Clot burden is large. No evidence of right heart  strain currently as the RV/LV ratio is approximately 0.86. 2. A primary malignancy is not clearly identified on the current examination. 3. Necrotic approximate 2 cm right paraesophageal mediastinal lymph node and approximate 2 cm left paratracheal mediastinal lymph node. No pathologic lymphadenopathy elsewhere in the chest, abdomen or pelvis. 4. Approximate 8 mm pleural based nodule in the lateral aspect of the SUPERIOR segment LEFT LOWER LOBE and approximate 4 mm nodule in the LEFT UPPER LOBE. No nodules elsewhere in either lung. 5. Severe chronic pulmonary fibrosis. No acute cardiopulmonary disease. 6. No acute abnormalities involving the abdomen or pelvis. 7. Diffuse hepatic steatosis without evidence of hepatic mass. 8. Elongated and tortuous sigmoid colon without evidence of a colonic mass. Aortic Atherosclerosis (ICD10-170.0) One might consider biopsy of the skull mass in order to establish the source of the malignancy. These critical/emergent results were discussed directly by telephone with the hospitalist caring for the patient at the time of interpretation on 06/25/2018 at 4:12 p.m. Electronically Signed   By: Evangeline Dakin M.D.   On: 06/25/2018 16:19   [name] ME Contrast  Result Date: 06/24/2018 CLINICAL DATA:  78 year old male with headache and destructive lesion at the skull vertex by  CT yesterday. EXAM: MRI HEAD WITHOUT AND WITH CONTRAST TECHNIQUE: Multiplanar, multiecho pulse sequences of the brain and surrounding structures were obtained without and with intravenous contrast. CONTRAST:  8 milliliters Gadavist COMPARISON:  Head CT 06/23/2018. FINDINGS: Brain: Poorly delineated and infiltrative appearing lesion of the posterior skull vertex demonstrates replacement of the normal marrow in an area of 6-7 centimeters (series 9, image 13), with associated abnormal diffusion and heterogeneous enhancement of the bone. There is extraosseous extension both intracranially and superficially into the  scalp (series 20, image 13) encompassing 16-20 millimeters in thickness. Associated fairly focal pachymeningeal thickening and T2 hyperintense soft tissue mass centered around the posterosuperior sagittal sinus (series 17, image 5, series 18, image 5). Some of the lesion appears centrally necrotic or nonvascular (series 19, image 48). The sagittal sinus is narrow it but remains patent. There is no significant mass effect on the brain parenchyma at this time, and no cerebral edema identified. Elsewhere the dura appears within normal limits. No leptomeningeal thickening identified. No abnormal parenchymal enhancement of the brain is identified. Cerebral volume is within normal limits. No restricted diffusion to suggest acute infarction. No midline shift,ventriculomegaly, or acute intracranial hemorrhage. Cervicomedullary junction and pituitary are within normal limits. There are occasional chronic micro hemorrhages in the brain (right parietal lobe series 14, image 35). No cortical encephalomalacia identified and otherwise normal for age gray and white matter signal. Vascular: Major intracranial vascular flow voids are preserved, although the superior sagittal sinus is partially invaded and narrow it by the vertex lesion (series 17 images 4 through 6). The left vertebral artery is dominant. The other major dural venous sinuses are enhancing and appear to be patent. Skull and upper cervical spine: Posterior skull vertex bone lesion described above. Elsewhere visible bone marrow signal is within normal limits. Negative visible cervical spine aside from degenerative changes. Sinuses/Orbits: Negative; prior cataract surgery. Other: Mastoids are clear. There is subtle nodularity and enhancement in the left internal auditory canal (series 10, image 6 and series 19, image 15. This is 2-3 millimeters. The other visible internal auditory structures, including the left cochlea and vestibule appear grossly normal. Normal  stylomastoid foramina. Abnormal scalp invasion over the posterior skull vertex as stated above. Other scalp and face soft tissues appear negative. IMPRESSION: 1. 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 No associated cerebral edema. No leptomeningeal involvement or direct brain invasion is suspected. The lesion invades and narrows the superior sagittal sinus, but the sinus remains patent. A 6-7 cm diameter area of the bone and dura appears infiltrated, with the remaining confluent extraosseous component encompassing 2-3 cm. Top differential considerations include metastasis, meningioma, and lymphoma. 2. 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 Dedicated IAC protocol brain MRI would better characterize this lesion in the future. 3. No other bone lesion and no other acute or neoplastic process identified in the head. There are occasional chronic cerebral microhemorrhages. Electronically Signed   By: Genevie Ann M.D.   On: 06/24/2018 06:24   Ct Abdomen Pelvis W Contrast  Result Date: 06/25/2018 CLINICAL DATA:  78 year old with a mass involving the skull with extension into the leptomeninges and the scalp, possibly a metastasis. This examination is performed in order to potentially identify a primary malignancy. EXAM: CT CHEST, ABDOMEN, AND PELVIS WITH CONTRAST TECHNIQUE: Multidetector CT imaging of the chest, abdomen and pelvis was performed following the  standard protocol during bolus administration of intravenous contrast. Oral contrast was also administered. CONTRAST:  117mL OMNIPAQUE IOHEXOL 300 MG/ML IV. COMPARISON:  No prior chest or abdominopelvic CT. The MRI brain performed yesterday is correlated. FINDINGS: CT CHEST FINDINGS Cardiovascular: Normal heart size. LAD coronary stent. No pericardial effusion. Filling defects within the  distal RIGHT main pulmonary artery extending into the RIGHT UPPER LOBE pulmonary artery, the intralobar pulmonary artery and branches of the RIGHT MIDDLE LOBE and RIGHT LOWER LOBE pulmonary artery. Filling defects within the proximal LEFT UPPER LOBE pulmonary artery and the distal LEFT LOWER LOBE pulmonary artery and several of its branches. No evidence of RIGHT heart strain currently as the RV/LV ratio measures approximately 0.86. Mild atherosclerosis involving the thoracic aorta without evidence of aneurysm. Visualized proximal great vessels widely patent. Mediastinum/Nodes: Necrotic approximate 1.1 x 2.0 cm RIGHT paraesophageal mediastinal lymph node (station 8), series 3, image 33. Approximate 1.6 x 2.1 cm LEFT paratracheal mediastinal node (station 4 L), series 3, image 22. Multiple normal sized lymph nodes elsewhere throughout the mediastinum. Normal sized BILATERAL hilar lymph nodes. Normal axillary lymph nodes. No visible supraclavicular lymphadenopathy. Normal appearing esophagus. Normal-appearing thyroid gland. Lungs/Pleura: Severe changes of peripheral pulmonary fibrosis throughout both lungs, with LOWER zonal predominance. Approximate 4 x 5 mm (5 mm mean) nodule in the LEFT UPPER LOBE on image 38 of series 5. Approximate 8 x 8 mm (8 mm mean) pleural based nodule laterally in the SUPERIOR segment LEFT LOWER LOBE on image 67 of series 5. No parenchymal nodules or masses elsewhere in either lung. No confluent or ground-glass airspace consolidation in either lung. No pleural effusions. Central airways patent. Mild BILATERAL lower lobe bronchiectasis. Musculoskeletal: Degenerative disc disease and spondylosis involving the visualized LOWER cervical spine and throughout the thoracic spine. No acute findings. No evidence of osseous metastatic disease. CT ABDOMEN PELVIS FINDINGS Hepatobiliary: Diffuse hepatic steatosis without evidence of a hepatic mass. Gallbladder normal in appearance without calcified  gallstones. No biliary ductal dilation. Pancreas: Normal in appearance without evidence of mass, ductal dilation, or inflammation. Spleen: Normal in size and appearance. Adrenals/Urinary Tract: Normal appearing adrenal glands. Subcentimeter cortical cysts involving both kidneys. No solid renal masses. No urinary tract calculi. No hydronephrosis. Normal appearing urinary bladder. Stomach/Bowel: Stomach normal in appearance for the degree of distention. Normal-appearing small bowel. The sigmoid colon is elongated and tortuous and extends well up into the RIGHT UPPER QUADRANT of the abdomen beneath the RIGHT hemidiaphragm. Moderate stool burden is present throughout the colon. A colonic mass is not identified. Appendix is not conspicuous but there is no pericecal inflammation. Vascular/Lymphatic: Mild aortoiliofemoral atherosclerosis without evidence of aneurysm. Normal-appearing portal venous and systemic venous systems. No pathologic lymphadenopathy. Reproductive: Mild median lobe prostate gland enlargement. Scattered prostatic calcifications. Normal seminal vesicles. Other: Phleboliths low in both sides of the pelvis. Musculoskeletal: Lumbar levoscoliosis. Multilevel degenerative disc disease, spondylosis and facet degenerative changes. No acute findings. No evidence of osseous metastatic disease. IMPRESSION: 1. Acute pulmonary emboli involving the distal right main pulmonary artery, the interlobar pulmonary artery, the RIGHT UPPER LOBE pulmonary artery, the RIGHT MIDDLE and RIGHT LOWER LOBE pulmonary arteries and their branches, the LEFT LOWER LOBE pulmonary artery and its branches, and minimally in the LEFT UPPER LOBE pulmonary artery. Clot burden is large. No evidence of right heart strain currently as the RV/LV ratio is approximately 0.86. 2. A primary malignancy is not clearly identified on the current examination. 3. Necrotic approximate 2 cm right paraesophageal mediastinal lymph node and approximate  2 cm  left paratracheal mediastinal lymph node. No pathologic lymphadenopathy elsewhere in the chest, abdomen or pelvis. 4. Approximate 8 mm pleural based nodule in the lateral aspect of the SUPERIOR segment LEFT LOWER LOBE and approximate 4 mm nodule in the LEFT UPPER LOBE. No nodules elsewhere in either lung. 5. Severe chronic pulmonary fibrosis. No acute cardiopulmonary disease. 6. No acute abnormalities involving the abdomen or pelvis. 7. Diffuse hepatic steatosis without evidence of hepatic mass. 8. Elongated and tortuous sigmoid colon without evidence of a colonic mass. Aortic Atherosclerosis (ICD10-170.0) One might consider biopsy of the skull mass in order to establish the source of the malignancy. These critical/emergent results were discussed directly by telephone with the hospitalist caring for the patient at the time of interpretation on 06/25/2018 at 4:12 p.m. Electronically Signed   By: Evangeline Dakin M.D.   On: 06/25/2018 16:19   US Thyroid  Result Date: 06/25/2018 CLINICAL DATA:  Other.  Intracranial mass. EXAM: THYROID ULTRASOUND TECHNIQUE: Ultrasound examination of the thyroid gland and adjacent soft tissues was performed. COMPARISON:  None. FINDINGS: Parenchymal Echotexture: Mildly heterogenous Isthmus: 0.1 cm Right lobe: 2.8 x 1.5 x 1.3 cm Left lobe: 3.2 x 1.5 x 1.3 cm _________________________________________________________ Estimated total number of nodules >/= 1 cm: 1 Number of spongiform nodules >/=  2 cm not described below (TR1): 0 Number of mixed cystic and solid nodules >/= 1.5 cm not described below (TR2): 0 _________________________________________________________ Nodule # 1: Location: Left; Superior Maximum size: 1.4 cm; Other 2 dimensions: 0.5 x 0.8 cm Composition: solid/almost completely solid (2) Echogenicity: hypoechoic (2) Shape: not taller-than-wide (0) Margins: ill-defined (0) Echogenic foci: none (0) ACR TI-RADS total points: 4. ACR TI-RADS risk category: TR4 (4-6 points). ACR  TI-RADS recommendations: *Given size (>/= 1 - 1.4 cm) and appearance, a follow-up ultrasound in 1 year should be considered based on TI-RADS criteria. _________________________________________________________ Two other hyperechoic areas measured in both right and left lobes do not have the appearance of discrete nodules and are consistent with hyperechoic islands of tissue that appear benign (white knight nodules). No abnormal lymph nodes identified. IMPRESSION: 1.4 cm left superior hypoechoic nodule meets criteria for 1 year follow-up ultrasound. The above is in keeping with the ACR TI-RADS recommendations - J Am Coll Radiol 2017;14:587-595. Electronically Signed   By: Aletta Edouard M.D.   On: 06/25/2018 08:08    Assessment and Plan:  1.  Skull mass, chest lymphadenopathy  MRI brain 06/24/2018-posterior vertex infiltrated and poorly marginated mass with extending into the underlying pachymeningeal's and into the scalp, the mass invades and narrows the superior sagittal sinus  CTs of the chest, abdomen, and pelvis on 06/25/2018-acute bilateral pulmonary emboli with a large clot burden, necrotic mediastinal adenopathy, 8 mm pleural-based left lower lobe nodule, 4 mm left upper lobe nodules, chronic pulmonary fibrosis 2.  NSTEMI-cardiac catheterization 06/24/2018 with 99% LAD stenosis, status post placement of a drug-eluting stent 3.  Bilateral pulmonary emboli on CT 06/25/2018-maintained on heparin 4.  History of gastroesophageal reflux disease and Barrett's esophagus 5.  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  Rheumatoid arthritis, treated with Humira and methotrexate 7.  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  Pulmonary fibrosis noted on CT 06/25/2018   Mr. Saber was admitted with presyncope, likely related to acute pulmonary embolism.  There is imaging evidence of a malignancy involving chest lymph nodes and a skull mass.  No clinical  evidence of a primary tumor site.  The differential  diagnosis is broad.  The scalp mass could be biopsied if this is felt to be safe by neurosurgery or interventional radiology.  If the scalp mass cannot be safely biopsied I recommend proceeding with an outpatient PET scan to look for other sites of measurable disease.  Recommendations: 1.  Biopsy of skull mass by interventional radiology or neurosurgery if safe while on aspirin and Brilinta, heparin can be stopped a few hours for the biopsy procedure 2.  Outpatient PET scan 3.  Follow-up with the Cancer center after the PET scan 4.  Anticoagulation therapy with Lovenox or a DOAC depending on whether he has a biopsy prior to discharge. 5.  Bilateral lower extremity Dopplers   [name], MD 06/26/2018, 7:16 PM

## 2018-06-26 NOTE — Progress Notes (Signed)
Patient ID: Nicholas Webb, male   DOB: 1940-12-07, 78 y.o.   MRN: [id number] Chart reviewed. Now has a stent and a large PE. Will be on anti-plt and anti-coags, making biopsy of the lesion much more risky. Would consider quick needle biopsy with IR if they feel they can do so.

## 2018-06-26 NOTE — Progress Notes (Signed)
Pt had an episode of bradycardia 37-39 while pt is asleep. Pt is asymptomatic.

## 2018-06-26 NOTE — Progress Notes (Signed)
ANTICOAGULATION CONSULT NOTE  Pharmacy Consult for Heparin Indication: PE  No Known Allergies  Patient Measurements: Height: 5\' 9"  (175.3 cm) Weight: 181 lb 1.6 oz (82.1 kg) IBW/kg (Calculated) : 70.7 Heparin Dosing Weight: 87kg  Vital Signs: Temp: 97.5 F (36.4 C) (04/25 2036) Temp Source: Oral (04/25 2036) BP: 123/78 (04/25 2036) Pulse Rate: 78 (04/25 2126)  Labs: Recent Labs    06/23/18 1701 06/23/18 1932 06/24/18 0234 06/24/18 0649 06/24/18 1148 06/25/18 0403 06/25/18 2332  HGB 13.4  --  11.6*  --   --  11.7*  --   HCT 41.4  --  34.8*  --   --  35.0*  --   PLT 158  --  150  --   --  136*  --   HEPARINUNFRC  --   --  0.48  --  0.42  --  0.19*  CREATININE 1.04  --  1.00  --   --  0.85  --   TROPONINI 0.32* 0.33* 0.25* 0.17*  --   --   --     Estimated Creatinine Clearance: 73.9 mL/min (by C-G formula based on SCr of 0.85 mg/dL).  Medical History: Past Medical History:  Diagnosis Date  . Anemia of chronic disease   . Atherosclerosis of both carotid arteries   . Barrett's esophagus   . Dizziness   . GERD (gastroesophageal reflux disease)   . Hx of adenomatous colonic polyps   . Hyperlipidemia   . Macrocytosis without anemia   . Obesity   . OSA (obstructive sleep apnea)   . Rheumatoid arthritis(714.0)   . Syncope    Assessment: 65 YOM s/p NSTEMI and cath with DES placed.. Pharmacy consulted for heparin dosing pending cath on 4/24.  CT now shows PE and pleural nodules. Pharmacy consulted to restart heparin. Patient noted on ASA and ticagrelor -hg= 11.7, plt= 136 -last heparin rate was 1100 units/hr with heparin level= 0.48  4/26 AM update: heparin level is low after re-starting for PE  Goal of Therapy:  Heparin level 0.3-0.7 units/ml Monitor platelets by anticoagulation protocol: Yes   Plan:  -Inc heparin to 1250 units/hr -Re-check heparin level in 8 hours  Narda Bonds, PharmD, [place] Pharmacist Phone: [phone]

## 2018-06-26 NOTE — Progress Notes (Signed)
[place] for Heparin Indication: PE  No Known Allergies  Patient Measurements: Height: 5\' 9"  (175.3 cm) Weight: 180 lb 1.6 oz (81.7 kg) IBW/kg (Calculated) : 70.7 Heparin Dosing Weight: 87kg  Vital Signs: Temp: 97.9 F (36.6 C) (04/26 0459) Temp Source: Oral (04/26 0459) BP: 111/64 (04/26 0459) Pulse Rate: 62 (04/26 0459)  Labs: Recent Labs    06/23/18 1701 06/23/18 1932  06/24/18 0234 06/24/18 0649 06/24/18 1148 06/25/18 0403 06/25/18 2332 06/26/18 0453 06/26/18 0927  HGB 13.4  --   --  11.6*  --   --  11.7*  --  12.5*  --   HCT 41.4  --   --  34.8*  --   --  35.0*  --  36.8*  --   PLT 158  --   --  150  --   --  136*  --  143*  --   LABPROT  --   --   --   --   --   --   --   --  14.7  --   INR  --   --   --   --   --   --   --   --  1.2  --   HEPARINUNFRC  --   --    < > 0.48  --  0.42  --  0.19*  --  0.48  CREATININE 1.04  --   --  1.00  --   --  0.85  --   --   --   TROPONINI 0.32* 0.33*  --  0.25* 0.17*  --   --   --   --   --    < > = values in this interval not displayed.    Estimated Creatinine Clearance: 73.9 mL/min (by C-G formula based on SCr of 0.85 mg/dL).  Medical History: Past Medical History:  Diagnosis Date  . Anemia of chronic disease   . Atherosclerosis of both carotid arteries   . Barrett's esophagus   . Dizziness   . GERD (gastroesophageal reflux disease)   . Hx of adenomatous colonic polyps   . Hyperlipidemia   . Macrocytosis without anemia   . Obesity   . OSA (obstructive sleep apnea)   . Rheumatoid arthritis(714.0)   . Syncope    Assessment: 28 YOM s/p NSTEMI and cath with DES placed.. Pharmacy consulted for heparin dosing pending cath on 4/24.  CT now shows PE and pleural nodules. Pharmacy consulted to restart heparin. Patient noted on ASA and ticagrelor  Heparin level at goal this AM.  CBC stable.  Goal of Therapy:  Heparin level 0.3-0.7 units/ml Monitor platelets by anticoagulation  protocol: Yes   Plan:  -Continue IV heparin at current rate. -Confirm heparin level in 8 hrs. -Daily heparin level and CBC -F/u plans for DOAC once invasive procedures over.  [name], [facility] Clinical Pharmacist Phone [phone]  06/26/2018 10:48 AM

## 2018-06-27 ENCOUNTER — Other Ambulatory Visit: Payer: Self-pay | Admitting: Oncology

## 2018-06-27 ENCOUNTER — Inpatient Hospital Stay (HOSPITAL_COMMUNITY): Payer: Medicare Other

## 2018-06-27 ENCOUNTER — Encounter (HOSPITAL_COMMUNITY): Payer: Self-pay | Admitting: Cardiovascular Disease

## 2018-06-27 ENCOUNTER — Telehealth: Payer: Self-pay | Admitting: Oncology

## 2018-06-27 DIAGNOSIS — I2699 Other pulmonary embolism without acute cor pulmonale: Secondary | ICD-10-CM

## 2018-06-27 DIAGNOSIS — R590 Localized enlarged lymph nodes: Secondary | ICD-10-CM

## 2018-06-27 LAB — CBC
HCT: 38.2 % — ABNORMAL LOW (ref 39.0–52.0)
HCT: 39 % (ref 39.0–52.0)
Hemoglobin: 12.6 g/dL — ABNORMAL LOW (ref 13.0–17.0)
Hemoglobin: 12.9 g/dL — ABNORMAL LOW (ref 13.0–17.0)
MCH: 33.4 pg (ref 26.0–34.0)
MCH: 33.3 pg (ref 26.0–34.0)
MCHC: 33 g/dL (ref 30.0–36.0)
MCHC: 33.1 g/dL (ref 30.0–36.0)
MCV: 101.3 fL — ABNORMAL HIGH (ref 80.0–100.0)
MCV: 100.8 fL — ABNORMAL HIGH (ref 80.0–100.0)
Platelets: 165 10*3/uL (ref 150–400)
Platelets: 172 10*3/uL (ref 150–400)
RBC: 3.77 MIL/uL — ABNORMAL LOW (ref 4.22–5.81)
RBC: 3.87 MIL/uL — ABNORMAL LOW (ref 4.22–5.81)
RDW: 14.7 % (ref 11.5–15.5)
RDW: 14.7 % (ref 11.5–15.5)
WBC: 10.3 10*3/uL (ref 4.0–10.5)
WBC: 8.7 10*3/uL (ref 4.0–10.5)
nRBC: 0 % (ref 0.0–0.2)
nRBC: 0 % (ref 0.0–0.2)

## 2018-06-27 LAB — HEPARIN LEVEL (UNFRACTIONATED): Heparin Unfractionated: 0.6 IU/mL (ref 0.30–0.70)

## 2018-06-27 LAB — LACTATE DEHYDROGENASE: LDH: 198 U/L — ABNORMAL HIGH (ref 98–192)

## 2018-06-27 MED ORDER — APIXABAN 5 MG PO TABS: 5.0000 mg | ORAL_TABLET | Freq: Two times a day (BID) | ORAL | Status: DC

## 2018-06-27 MED ORDER — APIXABAN 5 MG PO TABS
10.0000 mg | ORAL_TABLET | Freq: Two times a day (BID) | ORAL | Status: DC
  Administered 2018-06-28: 10 mg via ORAL
  Filled 2018-06-27: qty 2

## 2018-06-27 MED FILL — Nitroglycerin IV Soln 100 MCG/ML in D5W: INTRA_ARTERIAL | Qty: 10 | Status: AC

## 2018-06-27 NOTE — Progress Notes (Addendum)
ANTICOAGULATION CONSULT NOTE - Follow Up Consult  Pharmacy Consult for Heparin>>Eliquis 4/28 Indication: pulmonary embolus  No Known Allergies  Patient Measurements: Height: 5\' 9"  (175.3 cm) Weight: 177 lb 8 oz (80.5 kg) IBW/kg (Calculated) : 70.7 Heparin Dosing Weight: 80.5 kf  Vital Signs: Temp: 97.7 F (36.5 C) (04/27 0439) Temp Source: Oral (04/27 0439) BP: 103/67 (04/27 0439) Pulse Rate: 66 (04/27 0439)  Labs: Recent Labs    06/25/18 0403  06/26/18 0453 06/26/18 0927 06/26/18 1653 06/27/18 0432 06/27/18 0853  HGB 11.7*  --  12.5*  --   --  12.6* 12.9*  HCT 35.0*  --  36.8*  --   --  38.2* 39.0  PLT 136*  --  143*  --   --  165 172  LABPROT  --   --  14.7  --   --   --   --   INR  --   --  1.2  --   --   --   --   HEPARINUNFRC  --    < >  --  0.48 0.43  --  0.60  CREATININE 0.85  --   --   --   --   --   --    < > = values in this interval not displayed.    Estimated Creatinine Clearance: 73.9 mL/min (by C-G formula based on SCr of 0.85 mg/dL).   Assessment: Anticoag: NSTEMI s/p cath now 4/25: CT now shows PE and pleural nodules. Restart heparin. HL 0.6 in goal. Hgb 12.9 and plts 172 both stable.   Goal of Therapy:  Heparin level 0.3-0.7 units/ml Monitor platelets by anticoagulation protocol: Yes   Plan:  -Heparin 1250 units/hr - Daily HL and CBC -- 4/28: Start Eliquis 10mg  BID x 7d, then 5mg  BID  Denissa Cozart S. Alford Highland, PharmD, BCPS Clinical Staff Pharmacist Eilene Ghazi Stillinger 06/27/2018,10:37 AM

## 2018-06-27 NOTE — TOC Benefit Eligibility Note (Signed)
Transition of Care [facility]) Benefit Eligibility Note    Patient Details  Name: [name] MRN: [id number] Date of Birth: 1940/08/28   Medication/Dose: Arne [place]  10 MG (NON-FORMULARY)  Covered?: Yes  Tier: 2 Drug  Prescription Coverage Preferred Pharmacy: YES([facility])  Spoke with Person/Company/Phone Number:: JAY([employer] # [phone])  Co-Pay: ZERO DOLLARS  Prior Approval: No  Deductible: Unmet  Additional Notes: ELIQUIS  5 MG BID   COVER- YES, CO-PAY- ZERO DOLLARS, TIER- 2 DRUG  AND PRIOR APPROVAL- NO    Memory Argue Phone Number: 06/27/2018, 12:23 PM

## 2018-06-27 NOTE — Progress Notes (Signed)
Patient refused CPAP for tonight. RT informed patient to have RT called if he changes his mind.

## 2018-06-27 NOTE — Progress Notes (Addendum)
IP PROGRESS NOTE  Subjective:   No complaint.  He wants to go home.  Objective: Vital signs in last 24 hours: Blood pressure 103/67, pulse 66, temperature 97.7 F (36.5 C), temperature source Oral, resp. rate 18, height 5\' 9"  (1.753 m), weight 177 lb 8 oz (80.5 kg), SpO2 96 %.  Intake/Output from previous day: 04/26 0701 - 04/27 0700 In: 915.5 [P.O.:480; I.V.:435.5] Out: 685 [Urine:685]  Physical Exam: Not performed today  Lab Results: Recent Labs    06/26/18 0453 06/27/18 0432  WBC 8.7 10.3  HGB 12.5* 12.6*  HCT 36.8* 38.2*  PLT 143* 165    BMET Recent Labs    06/25/18 0403  NA 139  K 3.9  CL 107  CO2 24  GLUCOSE 106*  BUN 14  CREATININE 0.85  CALCIUM 8.9     Studies/Results: Ct Chest W Contrast  Result Date: 06/25/2018 CLINICAL DATA:  78 year old with a mass involving the skull with extension into the leptomeninges and the scalp, possibly a metastasis. This examination is performed in order to potentially identify a primary malignancy. EXAM: CT CHEST, ABDOMEN, AND PELVIS WITH CONTRAST TECHNIQUE: Multidetector CT imaging of the chest, abdomen and pelvis was performed following the standard protocol during bolus administration of intravenous contrast. Oral contrast was also administered. CONTRAST:  117mL OMNIPAQUE IOHEXOL 300 MG/ML IV. COMPARISON:  No prior chest or abdominopelvic CT. The MRI brain performed yesterday is correlated. FINDINGS: CT CHEST FINDINGS Cardiovascular: Normal heart size. LAD coronary stent. No pericardial effusion. Filling defects within the distal RIGHT main pulmonary artery extending into the RIGHT UPPER LOBE pulmonary artery, the intralobar pulmonary artery and branches of the RIGHT MIDDLE LOBE and RIGHT LOWER LOBE pulmonary artery. Filling defects within the proximal LEFT UPPER LOBE pulmonary artery and the distal LEFT LOWER LOBE pulmonary artery and several of its branches. No evidence of RIGHT heart strain currently as the RV/LV ratio  measures approximately 0.86. Mild atherosclerosis involving the thoracic aorta without evidence of aneurysm. Visualized proximal great vessels widely patent. Mediastinum/Nodes: Necrotic approximate 1.1 x 2.0 cm RIGHT paraesophageal mediastinal lymph node (station 8), series 3, image 33. Approximate 1.6 x 2.1 cm LEFT paratracheal mediastinal node (station 4 L), series 3, image 22. Multiple normal sized lymph nodes elsewhere throughout the mediastinum. Normal sized BILATERAL hilar lymph nodes. Normal axillary lymph nodes. No visible supraclavicular lymphadenopathy. Normal appearing esophagus. Normal-appearing thyroid gland. Lungs/Pleura: Severe changes of peripheral pulmonary fibrosis throughout both lungs, with LOWER zonal predominance. Approximate 4 x 5 mm (5 mm mean) nodule in the LEFT UPPER LOBE on image 38 of series 5. Approximate 8 x 8 mm (8 mm mean) pleural based nodule laterally in the SUPERIOR segment LEFT LOWER LOBE on image 67 of series 5. No parenchymal nodules or masses elsewhere in either lung. No confluent or ground-glass airspace consolidation in either lung. No pleural effusions. Central airways patent. Mild BILATERAL lower lobe bronchiectasis. Musculoskeletal: Degenerative disc disease and spondylosis involving the visualized LOWER cervical spine and throughout the thoracic spine. No acute findings. No evidence of osseous metastatic disease. CT ABDOMEN PELVIS FINDINGS Hepatobiliary: Diffuse hepatic steatosis without evidence of a hepatic mass. Gallbladder normal in appearance without calcified gallstones. No biliary ductal dilation. Pancreas: Normal in appearance without evidence of mass, ductal dilation, or inflammation. Spleen: Normal in size and appearance. Adrenals/Urinary Tract: Normal appearing adrenal glands. Subcentimeter cortical cysts involving both kidneys. No solid renal masses. No urinary tract calculi. No hydronephrosis. Normal appearing urinary bladder. Stomach/Bowel: Stomach normal in  appearance for the  degree of distention. Normal-appearing small bowel. The sigmoid colon is elongated and tortuous and extends well up into the RIGHT UPPER QUADRANT of the abdomen beneath the RIGHT hemidiaphragm. Moderate stool burden is present throughout the colon. A colonic mass is not identified. Appendix is not conspicuous but there is no pericecal inflammation. Vascular/Lymphatic: Mild aortoiliofemoral atherosclerosis without evidence of aneurysm. Normal-appearing portal venous and systemic venous systems. No pathologic lymphadenopathy. Reproductive: Mild median lobe prostate gland enlargement. Scattered prostatic calcifications. Normal seminal vesicles. Other: Phleboliths low in both sides of the pelvis. Musculoskeletal: Lumbar levoscoliosis. Multilevel degenerative disc disease, spondylosis and facet degenerative changes. No acute findings. No evidence of osseous metastatic disease. IMPRESSION: 1. Acute pulmonary emboli involving the distal right main pulmonary artery, the interlobar pulmonary artery, the RIGHT UPPER LOBE pulmonary artery, the RIGHT MIDDLE and RIGHT LOWER LOBE pulmonary arteries and their branches, the LEFT LOWER LOBE pulmonary artery and its branches, and minimally in the LEFT UPPER LOBE pulmonary artery. Clot burden is large. No evidence of right heart strain currently as the RV/LV ratio is approximately 0.86. 2. A primary malignancy is not clearly identified on the current examination. 3. Necrotic approximate 2 cm right paraesophageal mediastinal lymph node and approximate 2 cm left paratracheal mediastinal lymph node. No pathologic lymphadenopathy elsewhere in the chest, abdomen or pelvis. 4. Approximate 8 mm pleural based nodule in the lateral aspect of the SUPERIOR segment LEFT LOWER LOBE and approximate 4 mm nodule in the LEFT UPPER LOBE. No nodules elsewhere in either lung. 5. Severe chronic pulmonary fibrosis. No acute cardiopulmonary disease. 6. No acute abnormalities involving the  abdomen or pelvis. 7. Diffuse hepatic steatosis without evidence of hepatic mass. 8. Elongated and tortuous sigmoid colon without evidence of a colonic mass. Aortic Atherosclerosis (ICD10-170.0) One might consider biopsy of the skull mass in order to establish the source of the malignancy. These critical/emergent results were discussed directly by telephone with the hospitalist caring for the patient at the time of interpretation on 06/25/2018 at 4:12 p.m. Electronically Signed   By: Evangeline Dakin M.D.   On: 06/25/2018 16:19   Ct Abdomen Pelvis W Contrast  Result Date: 06/25/2018 CLINICAL DATA:  78 year old with a mass involving the skull with extension into the leptomeninges and the scalp, possibly a metastasis. This examination is performed in order to potentially identify a primary malignancy. EXAM: CT CHEST, ABDOMEN, AND PELVIS WITH CONTRAST TECHNIQUE: Multidetector CT imaging of the chest, abdomen and pelvis was performed following the standard protocol during bolus administration of intravenous contrast. Oral contrast was also administered. CONTRAST:  12mL OMNIPAQUE IOHEXOL 300 MG/ML IV. COMPARISON:  No prior chest or abdominopelvic CT. The MRI brain performed yesterday is correlated. FINDINGS: CT CHEST FINDINGS Cardiovascular: Normal heart size. LAD coronary stent. No pericardial effusion. Filling defects within the distal RIGHT main pulmonary artery extending into the RIGHT UPPER LOBE pulmonary artery, the intralobar pulmonary artery and branches of the RIGHT MIDDLE LOBE and RIGHT LOWER LOBE pulmonary artery. Filling defects within the proximal LEFT UPPER LOBE pulmonary artery and the distal LEFT LOWER LOBE pulmonary artery and several of its branches. No evidence of RIGHT heart strain currently as the RV/LV ratio measures approximately 0.86. Mild atherosclerosis involving the thoracic aorta without evidence of aneurysm. Visualized proximal great vessels widely patent. Mediastinum/Nodes: Necrotic  approximate 1.1 x 2.0 cm RIGHT paraesophageal mediastinal lymph node (station 8), series 3, image 33. Approximate 1.6 x 2.1 cm LEFT paratracheal mediastinal node (station 4 L), series 3, image 22. Multiple normal sized lymph  nodes elsewhere throughout the mediastinum. Normal sized BILATERAL hilar lymph nodes. Normal axillary lymph nodes. No visible supraclavicular lymphadenopathy. Normal appearing esophagus. Normal-appearing thyroid gland. Lungs/Pleura: Severe changes of peripheral pulmonary fibrosis throughout both lungs, with LOWER zonal predominance. Approximate 4 x 5 mm (5 mm mean) nodule in the LEFT UPPER LOBE on image 38 of series 5. Approximate 8 x 8 mm (8 mm mean) pleural based nodule laterally in the SUPERIOR segment LEFT LOWER LOBE on image 67 of series 5. No parenchymal nodules or masses elsewhere in either lung. No confluent or ground-glass airspace consolidation in either lung. No pleural effusions. Central airways patent. Mild BILATERAL lower lobe bronchiectasis. Musculoskeletal: Degenerative disc disease and spondylosis involving the visualized LOWER cervical spine and throughout the thoracic spine. No acute findings. No evidence of osseous metastatic disease. CT ABDOMEN PELVIS FINDINGS Hepatobiliary: Diffuse hepatic steatosis without evidence of a hepatic mass. Gallbladder normal in appearance without calcified gallstones. No biliary ductal dilation. Pancreas: Normal in appearance without evidence of mass, ductal dilation, or inflammation. Spleen: Normal in size and appearance. Adrenals/Urinary Tract: Normal appearing adrenal glands. Subcentimeter cortical cysts involving both kidneys. No solid renal masses. No urinary tract calculi. No hydronephrosis. Normal appearing urinary bladder. Stomach/Bowel: Stomach normal in appearance for the degree of distention. Normal-appearing small bowel. The sigmoid colon is elongated and tortuous and extends well up into the RIGHT UPPER QUADRANT of the abdomen  beneath the RIGHT hemidiaphragm. Moderate stool burden is present throughout the colon. A colonic mass is not identified. Appendix is not conspicuous but there is no pericecal inflammation. Vascular/Lymphatic: Mild aortoiliofemoral atherosclerosis without evidence of aneurysm. Normal-appearing portal venous and systemic venous systems. No pathologic lymphadenopathy. Reproductive: Mild median lobe prostate gland enlargement. Scattered prostatic calcifications. Normal seminal vesicles. Other: Phleboliths low in both sides of the pelvis. Musculoskeletal: Lumbar levoscoliosis. Multilevel degenerative disc disease, spondylosis and facet degenerative changes. No acute findings. No evidence of osseous metastatic disease. IMPRESSION: 1. Acute pulmonary emboli involving the distal right main pulmonary artery, the interlobar pulmonary artery, the RIGHT UPPER LOBE pulmonary artery, the RIGHT MIDDLE and RIGHT LOWER LOBE pulmonary arteries and their branches, the LEFT LOWER LOBE pulmonary artery and its branches, and minimally in the LEFT UPPER LOBE pulmonary artery. Clot burden is large. No evidence of right heart strain currently as the RV/LV ratio is approximately 0.86. 2. A primary malignancy is not clearly identified on the current examination. 3. Necrotic approximate 2 cm right paraesophageal mediastinal lymph node and approximate 2 cm left paratracheal mediastinal lymph node. No pathologic lymphadenopathy elsewhere in the chest, abdomen or pelvis. 4. Approximate 8 mm pleural based nodule in the lateral aspect of the SUPERIOR segment LEFT LOWER LOBE and approximate 4 mm nodule in the LEFT UPPER LOBE. No nodules elsewhere in either lung. 5. Severe chronic pulmonary fibrosis. No acute cardiopulmonary disease. 6. No acute abnormalities involving the abdomen or pelvis. 7. Diffuse hepatic steatosis without evidence of hepatic mass. 8. Elongated and tortuous sigmoid colon without evidence of a colonic mass. Aortic  Atherosclerosis (ICD10-170.0) One might consider biopsy of the skull mass in order to establish the source of the malignancy. These critical/emergent results were discussed directly by telephone with the hospitalist caring for the patient at the time of interpretation on 06/25/2018 at 4:12 p.m. Electronically Signed   By: Evangeline Dakin M.D.   On: 06/25/2018 16:19    Medications: I have reviewed the patient's current medications.  Assessment/Plan:  1.  Skull mass, chest lymphadenopathy  MRI brain 06/24/2018-posterior vertex infiltrated  and poorly marginated mass with extending into the underlying pachymeningeal's and into the scalp, the mass invades and narrows the superior sagittal sinus  CTs of the chest, abdomen, and pelvis on 06/25/2018-acute bilateral pulmonary emboli with a large clot burden, necrotic mediastinal adenopathy, 8 mm pleural-based left lower lobe nodule, 4 mm left upper lobe nodules, chronic pulmonary fibrosis 2.  NSTEMI-cardiac catheterization 06/24/2018 with 99% LAD stenosis, status post placement of a drug-eluting stent 3.  Bilateral pulmonary emboli on CT 06/25/2018-maintained on heparin 4.  History of gastroesophageal reflux disease and Barrett's esophagus 5.  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  Rheumatoid arthritis, treated with Humira and methotrexate 7.  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  Pulmonary fibrosis noted on CT 06/25/2018   Mr. Govan appears unchanged.  There is no apparent primary tumor site on review of the brain MRI and CTs. he is maintained on methotrexate and Humira for treatment of rheumatoid arthritis.  These medications can be associated with malignancy, especially lymphoma.  However the clinical presentation is not suggestive of lymphoma.  I discussed the case with interventional radiology.  Interventional radiology and neurosurgery do not recommend a biopsy of the skull mass. He is now  maintained on aspirin/Brilinta and heparin anticoagulation after the pulmonary embolism and placement of a coronary stent.  Recommendations: 1.  Continue anticoagulation therapy, convert to Lovenox or a DOAC at discharge 2.  Outpatient PET scan 3.  Check CEA and LDH 4.  An outpatient biopsy will be arranged based on the PET scan result 5.  Follow-up lower extremity Dopplers 6.  Outpatient follow-up at the [facility] to be scheduled after the PET scan   LOS: 4 days   [name], MD   06/27/2018, 8:33 AM

## 2018-06-27 NOTE — Progress Notes (Signed)
Bilateral lower extremity venous duplex has been completed. Preliminary results can be found in CV Proc through chart review.  Results were given to the patient's nurse, Sam.   06/27/18 10:30 AM Carlos Levering RVT

## 2018-06-27 NOTE — Progress Notes (Signed)
Pt refuses CPAP 

## 2018-06-27 NOTE — Progress Notes (Signed)
Patient sitting in bed alert and seems comfortable. No new complaints. Denies any headaches currently. Nothing for neurosurgery to do at this point given his PE and anticoagulation.

## 2018-06-27 NOTE — Progress Notes (Addendum)
PROGRESS NOTE    Nicholas Webb  XHB:[id number] DOB: [birth year] DOA: 06/23/2018 PCP: Maurice Small, MD   Brief Narrative: 78 y.o. male  W/ hX of Barrets/GERD,steoarthritis/RA, hyperlipidemia came to the hospital with complains of chest pain that started While he was cutting grass 4/22, substernal, squeezing in nature without associated shortness of breath.  After rest pain resolved but would come back with activity.Patient also felt lightheaded dizzy with presyncopal episode on the morning of 4/23. Recently as outpatient has been evaluated by a neurologist for headaches and has been labeled as tension headache.Currently he is not having 1. In the ER his troponins were elevated at 0.3.  EKG did not show any acute ST-T changes but there was concerns of non-STEMI therefore cardiology team was consulted and started on heparin drip.  CT of the head showedAbnormal destructive skull lesion. Patient was admitted underwent cardiac cath with stent placement.For his abnormal CT scan MRI brain seen by neurosurgery recommended to continue work-up for underlying malignancy. 4/25:CT chest showed bilateral pulmonary embolism, patient placed back on heparin drip. 4/27:DVT w + Lft femoral DVT.  Subjective: Doing well. No CP,SOB, N/V. On RA.  Assessment & Plan: NSTEMI status post cath with proximal LAD lesion 99% stenosed, s/p DES. Cont on aspirin/Brilinta, statin, beta-blocker. Cardio s/o.  Syncope: Unclear etiology.Could be secondary to patient's PE. He is being scheduled for outpatient cardiac monitoring device.Advised not to drive for 6 months as per state law patient verbalized understanding. Sinus bradycardia in monitor- but since 4/26 lowest HR was 57 mostly in 60-70s. 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 CT chest/abd/pelvis done NO OBVIOUS primary.sene by Dr Learta Codding. Has hx of Barrets, also on methotrexate and humira at risk for lymphoma.ldh, CE pending.Discussed with Dr Rosario Jacks for o/p PET scan  And take it from there.  Acute bilateral PE w large clot burden seen in CTA incidentally distal right main pulmonary:Discussed w Dr Margaretann Loveless from cardio- cont heparin gtt for now and recommends to cont triple therapy for 1 month then drop aspirin and after 1 year switch brilinta to aspirin. Spoke w Neurosurgery team [place]: they are okay to anticoagualtion as above as it is skull mass and they plan to follow as outpatient on non-emergent basis for skull mass. Cont heparin gtt for additional night and plan to switch to Eliquis in am-discussed w oncology-pharmacy consulted. I discussed risks/benefits w patient and agrees with current therapy, and plan to switch to Eliquis in the morning.  Acute DVT Left femoral vein:Cont anticoagualtion as above.   RA,stable on humira and methotrexate.  Severe chronic pulmonary fibrosis incidentally noted in CTA chest. w RA. Pt not aware. Advised o//p follow up.  GERD w hx of Barrets esophagitis:c/w ppi.  Generalized OA:prn tylenol.  HLD added statins.  DVT prophylaxis:Heparin. Code Status:Full Family Communication:Discussed the plan of care with the patient in detail, reviewed imaging, labs, CT findings. I had called and updated his daughter who is a PA w [employer] int medicine 4/25 and 4/27 and discussed in detail and agrees with current plan of care and anticoagulation , prefers eliquis Discussed w  oncology.  Disposition Plan:  Remains inpatient pending clinical  improvement.   Consultants:  Neurosurgery Cardiology Oncology  Procedures: [facility] 06/24/18  Mid RCA lesion is 20% stenosed.  Prox Cx to Mid Cx lesion is 20% stenosed.  Prox LAD lesion is 99% stenosed.  A drug-eluting stent was successfully placed using a STENT RESOLUTE ONYX 3.0X18.  Post intervention, there is a 0% residual stenosis.  Ost 1st Diag lesion is 80% stenosed.  1. Severe stenosis proximal to mid LAD 2. Successful PTCA/DES x 1 proximal LAD 3. Mild non-obstructive disease in the Circumflex and RCA  CTA Chest/Abdomen pelvis  1 Acute pulmonary emboli involving the distal right main pulmonary artery, the interlobar pulmonary artery, the RIGHT UPPER LOBE pulmonary artery, the RIGHT MIDDLE and RIGHT LOWER LOBE pulmonary arteries and their branches, the LEFT LOWER LOBE pulmonary artery and its branches, and minimally in the LEFT UPPER LOBE pulmonary artery. Clot burden is large. No evidence of right heart strain currently as the RV/LV ratio is approximately 0.86. 2. A primary malignancy is not clearly identified on the current examination. 3. Necrotic approximate 2 cm right paraesophageal mediastinal lymph node and approximate 2 cm left paratracheal mediastinal lymph node. No pathologic lymphadenopathy elsewhere in the chest, abdomen or pelvis. 4. Approximate 8 mm pleural based nodule in the lateral aspect of the SUPERIOR segment LEFT LOWER LOBE and approximate 4 mm nodule in the LEFT UPPER LOBE. No nodules elsewhere in either lung. 5. Severe chronic pulmonary fibrosis. No acute cardiopulmonary disease. 6. No acute abnormalities involving the abdomen or pelvis. 7. Diffuse hepatic steatosis without evidence of hepatic mass. 8. Elongated and tortuous sigmoid colon without evidence of a colonic mass.  4/27 DUPLEX LEG  Left: Findings consistent with acute deep vein thrombosis involving the left femoral vein. No cystic structure found in the popliteal  fossa.   Antimicrobials: Anti-infectives (From admission, onward)   None       Objective: Vitals:   06/26/18 0459 06/26/18 1348 06/26/18 1953 06/27/18 0439  BP: 111/64 105/66 110/84 103/67  Pulse: 62 (!) 57 75 66  Resp: 18  16 18   Temp: 97.9 F (36.6 C) 98.1 F (36.7 C) 98.1 F (36.7 C) 97.7 F (36.5 C)  TempSrc: Oral Oral Oral Oral  SpO2:  91% 94% 96%  Weight: 81.7 kg   80.5 kg  Height:        Intake/Output Summary (Last 24 hours) at 06/27/2018 0945 Last data filed at 06/27/2018 0600 Gross per 24 hour  Intake 675.47 ml  Output 685 ml  Net -9.53 ml   Filed Weights   06/25/18 0536 06/26/18 0459 06/27/18 0439  Weight: 82.1 kg 81.7 kg 80.5 kg   Weight change: -1.179 kg  Body mass index is 26.21 kg/m.  Intake/Output from previous day: 04/26 0701 - 04/27 0700 In: 915.5 [P.O.:480; I.V.:435.5] Out: 685 [Urine:685] Intake/Output this shift: No intake/output data recorded.  Examination: General exam: Calm, comfortable, not in acute distress, older for age, average built.  HEENT:Oral mucosa moist, Ear/Nose WNL grossly, dentition normal. Respiratory system: Bilateral equal air entry, no crackles and wheezing, no use of accessory muscle, non tender on palpation. Cardiovascular system: regular rate and rhythm, S1 & S2 heard, No JVD/murmurs. Gastrointestinal system: Abdomen soft, non-tender, non-distended, BS +. No hepatosplenomegaly palpable. Nervous System:Alert, awake and oriented at baseline. Able to move UE and LE, sensation intact. Extremities: No edema, distal peripheral pulses palpable.  Skin: No rashes,no icterus. MSK: Normal muscle bulk,tone, power  Medications:  Scheduled Meds:  aspirin EC  81 mg Oral Daily   atorvastatin  80 mg Oral q1800   chlorhexidine  15 mL Mouth Rinse BID   folic acid  2 mg Oral Daily   mouth rinse  15 mL Mouth Rinse q12n4p   metoprolol tartrate  12.5 mg Oral BID   pantoprazole  40 mg Oral Daily   sodium chloride flush  3  mL Intravenous Q12H   ticagrelor  90 mg Oral BID   tiZANidine  2 mg Oral QHS   Continuous Infusions:  sodium chloride 100 mL/hr at 06/24/18 3419   sodium chloride     heparin 1,250 Units/hr (06/27/18 0600)    Data Reviewed: I have personally reviewed following labs and imaging studies  CBC: Recent Labs  Lab 06/23/18 1701 06/24/18 0234 06/25/18 0403 06/26/18 0453 06/27/18 0432  WBC 7.9 10.1 9.5 8.7 10.3  NEUTROABS 5.7  --   --   --   --   HGB 13.4 11.6* 11.7* 12.5* 12.6*  HCT 41.4 34.8* 35.0* 36.8* 38.2*  MCV 103.5* 102.1* 101.7* 100.5* 101.3*  PLT 158 150 136* 143* 379   Basic Metabolic Panel: Recent Labs  Lab 06/23/18 1701 06/24/18 0234 06/25/18 0403  NA 140 140 139  K 4.2 4.0 3.9  CL 103 107 107  CO2 22 23 24   GLUCOSE 135* 111* 106*  BUN 22 25* 14  CREATININE 1.04 1.00 0.85  CALCIUM 9.5 8.9 8.9  MG 2.0 1.9  --    GFR: Estimated Creatinine Clearance: 73.9 mL/min (by C-G formula based on SCr of 0.85 mg/dL). Liver Function Tests: Recent Labs  Lab 06/24/18 0234  AST 40  ALT 49*  ALKPHOS 67  BILITOT 0.8  PROT 6.4*  ALBUMIN 3.2*   No results for input(s): LIPASE, AMYLASE in the last 168 hours. No results for input(s): AMMONIA in the last 168 hours. Coagulation Profile: Recent Labs  Lab 06/26/18 0453  INR 1.2   Cardiac Enzymes: Recent Labs  Lab 06/23/18 1701 06/23/18 1932 06/24/18 0234 06/24/18 0649  TROPONINI 0.32* 0.33* 0.25* 0.17*   BNP (last 3 results) No results for input(s): PROBNP in the last 8760 hours. HbA1C: No results for input(s): HGBA1C in the last 72 hours. CBG: No results for input(s): GLUCAP in the last 168 hours. Lipid Profile: No results for input(s): CHOL, HDL, LDLCALC, TRIG, CHOLHDL, LDLDIRECT in the last 72 hours. Thyroid Function Tests: No results for input(s): TSH, T4TOTAL, FREET4, T3FREE, THYROIDAB in the last 72 hours. Anemia Panel: No results for input(s): VITAMINB12, FOLATE, FERRITIN, TIBC, IRON, RETICCTPCT  in the last 72 hours. Sepsis Labs: No results for input(s): PROCALCITON, LATICACIDVEN in the last 168 hours.  No results found for this or any previous visit (from the past 240 hour(s)).    Radiology Studies: Ct Chest W Contrast  Result Date: 06/25/2018 CLINICAL DATA:  78 year old with a mass involving the skull with extension into the leptomeninges and the scalp, possibly a metastasis. This examination is performed in order to potentially identify a primary malignancy. EXAM: CT CHEST, ABDOMEN, AND PELVIS WITH CONTRAST TECHNIQUE: Multidetector CT imaging of the chest, abdomen and pelvis was performed following the standard protocol during bolus administration of intravenous contrast. Oral contrast was also administered. CONTRAST:  160mL OMNIPAQUE IOHEXOL 300 MG/ML IV. COMPARISON:  No prior chest or abdominopelvic CT. The MRI brain performed yesterday is correlated. FINDINGS: CT CHEST FINDINGS Cardiovascular: Normal heart size. LAD coronary stent. No pericardial effusion. Filling defects within the distal RIGHT main pulmonary artery extending  into the RIGHT UPPER LOBE pulmonary artery, the intralobar pulmonary artery and branches of the RIGHT MIDDLE LOBE and RIGHT LOWER LOBE pulmonary artery. Filling defects within the proximal LEFT UPPER LOBE pulmonary artery and the distal LEFT LOWER LOBE pulmonary artery and several of its branches. No evidence of RIGHT heart strain currently as the RV/LV ratio measures approximately 0.86. Mild atherosclerosis involving the thoracic aorta without evidence of aneurysm. Visualized proximal great vessels widely patent. Mediastinum/Nodes: Necrotic approximate 1.1 x 2.0 cm RIGHT paraesophageal mediastinal lymph node (station 8), series 3, image 33. Approximate 1.6 x 2.1 cm LEFT paratracheal mediastinal node (station 4 L), series 3, image 22. Multiple normal sized lymph nodes elsewhere throughout the mediastinum. Normal sized BILATERAL hilar lymph nodes. Normal axillary lymph  nodes. No visible supraclavicular lymphadenopathy. Normal appearing esophagus. Normal-appearing thyroid gland. Lungs/Pleura: Severe changes of peripheral pulmonary fibrosis throughout both lungs, with LOWER zonal predominance. Approximate 4 x 5 mm (5 mm mean) nodule in the LEFT UPPER LOBE on image 38 of series 5. Approximate 8 x 8 mm (8 mm mean) pleural based nodule laterally in the SUPERIOR segment LEFT LOWER LOBE on image 67 of series 5. No parenchymal nodules or masses elsewhere in either lung. No confluent or ground-glass airspace consolidation in either lung. No pleural effusions. Central airways patent. Mild BILATERAL lower lobe bronchiectasis. Musculoskeletal: Degenerative disc disease and spondylosis involving the visualized LOWER cervical spine and throughout the thoracic spine. No acute findings. No evidence of osseous metastatic disease. CT ABDOMEN PELVIS FINDINGS Hepatobiliary: Diffuse hepatic steatosis without evidence of a hepatic mass. Gallbladder normal in appearance without calcified gallstones. No biliary ductal dilation. Pancreas: Normal in appearance without evidence of mass, ductal dilation, or inflammation. Spleen: Normal in size and appearance. Adrenals/Urinary Tract: Normal appearing adrenal glands. Subcentimeter cortical cysts involving both kidneys. No solid renal masses. No urinary tract calculi. No hydronephrosis. Normal appearing urinary bladder. Stomach/Bowel: Stomach normal in appearance for the degree of distention. Normal-appearing small bowel. The sigmoid colon is elongated and tortuous and extends well up into the RIGHT UPPER QUADRANT of the abdomen beneath the RIGHT hemidiaphragm. Moderate stool burden is present throughout the colon. A colonic mass is not identified. Appendix is not conspicuous but there is no pericecal inflammation. Vascular/Lymphatic: Mild aortoiliofemoral atherosclerosis without evidence of aneurysm. Normal-appearing portal venous and systemic venous systems.  No pathologic lymphadenopathy. Reproductive: Mild median lobe prostate gland enlargement. Scattered prostatic calcifications. Normal seminal vesicles. Other: Phleboliths low in both sides of the pelvis. Musculoskeletal: Lumbar levoscoliosis. Multilevel degenerative disc disease, spondylosis and facet degenerative changes. No acute findings. No evidence of osseous metastatic disease. IMPRESSION: 1. Acute pulmonary emboli involving the distal right main pulmonary artery, the interlobar pulmonary artery, the RIGHT UPPER LOBE pulmonary artery, the RIGHT MIDDLE and RIGHT LOWER LOBE pulmonary arteries and their branches, the LEFT LOWER LOBE pulmonary artery and its branches, and minimally in the LEFT UPPER LOBE pulmonary artery. Clot burden is large. No evidence of right heart strain currently as the RV/LV ratio is approximately 0.86. 2. A primary malignancy is not clearly identified on the current examination. 3. Necrotic approximate 2 cm right paraesophageal mediastinal lymph node and approximate 2 cm left paratracheal mediastinal lymph node. No pathologic lymphadenopathy elsewhere in the chest, abdomen or pelvis. 4. Approximate 8 mm pleural based nodule in the lateral aspect of the SUPERIOR segment LEFT LOWER LOBE and approximate 4 mm nodule in the LEFT UPPER LOBE. No nodules elsewhere in either lung. 5. Severe chronic pulmonary fibrosis. No acute cardiopulmonary disease.  6. No acute abnormalities involving the abdomen or pelvis. 7. Diffuse hepatic steatosis without evidence of hepatic mass. 8. Elongated and tortuous sigmoid colon without evidence of a colonic mass. Aortic Atherosclerosis (ICD10-170.0) One might consider biopsy of the skull mass in order to establish the source of the malignancy. These critical/emergent results were discussed directly by telephone with the hospitalist caring for the patient at the time of interpretation on 06/25/2018 at 4:12 p.m. Electronically Signed   By: Evangeline Dakin M.D.   On:  06/25/2018 16:19   Ct Abdomen Pelvis W Contrast  Result Date: 06/25/2018 CLINICAL DATA:  78 year old with a mass involving the skull with extension into the leptomeninges and the scalp, possibly a metastasis. This examination is performed in order to potentially identify a primary malignancy. EXAM: CT CHEST, ABDOMEN, AND PELVIS WITH CONTRAST TECHNIQUE: Multidetector CT imaging of the chest, abdomen and pelvis was performed following the standard protocol during bolus administration of intravenous contrast. Oral contrast was also administered. CONTRAST:  12mL OMNIPAQUE IOHEXOL 300 MG/ML IV. COMPARISON:  No prior chest or abdominopelvic CT. The MRI brain performed yesterday is correlated. FINDINGS: CT CHEST FINDINGS Cardiovascular: Normal heart size. LAD coronary stent. No pericardial effusion. Filling defects within the distal RIGHT main pulmonary artery extending into the RIGHT UPPER LOBE pulmonary artery, the intralobar pulmonary artery and branches of the RIGHT MIDDLE LOBE and RIGHT LOWER LOBE pulmonary artery. Filling defects within the proximal LEFT UPPER LOBE pulmonary artery and the distal LEFT LOWER LOBE pulmonary artery and several of its branches. No evidence of RIGHT heart strain currently as the RV/LV ratio measures approximately 0.86. Mild atherosclerosis involving the thoracic aorta without evidence of aneurysm. Visualized proximal great vessels widely patent. Mediastinum/Nodes: Necrotic approximate 1.1 x 2.0 cm RIGHT paraesophageal mediastinal lymph node (station 8), series 3, image 33. Approximate 1.6 x 2.1 cm LEFT paratracheal mediastinal node (station 4 L), series 3, image 22. Multiple normal sized lymph nodes elsewhere throughout the mediastinum. Normal sized BILATERAL hilar lymph nodes. Normal axillary lymph nodes. No visible supraclavicular lymphadenopathy. Normal appearing esophagus. Normal-appearing thyroid gland. Lungs/Pleura: Severe changes of peripheral pulmonary fibrosis throughout  both lungs, with LOWER zonal predominance. Approximate 4 x 5 mm (5 mm mean) nodule in the LEFT UPPER LOBE on image 38 of series 5. Approximate 8 x 8 mm (8 mm mean) pleural based nodule laterally in the SUPERIOR segment LEFT LOWER LOBE on image 67 of series 5. No parenchymal nodules or masses elsewhere in either lung. No confluent or ground-glass airspace consolidation in either lung. No pleural effusions. Central airways patent. Mild BILATERAL lower lobe bronchiectasis. Musculoskeletal: Degenerative disc disease and spondylosis involving the visualized LOWER cervical spine and throughout the thoracic spine. No acute findings. No evidence of osseous metastatic disease. CT ABDOMEN PELVIS FINDINGS Hepatobiliary: Diffuse hepatic steatosis without evidence of a hepatic mass. Gallbladder normal in appearance without calcified gallstones. No biliary ductal dilation. Pancreas: Normal in appearance without evidence of mass, ductal dilation, or inflammation. Spleen: Normal in size and appearance. Adrenals/Urinary Tract: Normal appearing adrenal glands. Subcentimeter cortical cysts involving both kidneys. No solid renal masses. No urinary tract calculi. No hydronephrosis. Normal appearing urinary bladder. Stomach/Bowel: Stomach normal in appearance for the degree of distention. Normal-appearing small bowel. The sigmoid colon is elongated and tortuous and extends well up into the RIGHT UPPER QUADRANT of the abdomen beneath the RIGHT hemidiaphragm. Moderate stool burden is present throughout the colon. A colonic mass is not identified. Appendix is not conspicuous but there is no pericecal inflammation.  Vascular/Lymphatic: Mild aortoiliofemoral atherosclerosis without evidence of aneurysm. Normal-appearing portal venous and systemic venous systems. No pathologic lymphadenopathy. Reproductive: Mild median lobe prostate gland enlargement. Scattered prostatic calcifications. Normal seminal vesicles. Other: Phleboliths low in both  sides of the pelvis. Musculoskeletal: Lumbar levoscoliosis. Multilevel degenerative disc disease, spondylosis and facet degenerative changes. No acute findings. No evidence of osseous metastatic disease. IMPRESSION: 1. Acute pulmonary emboli involving the distal right main pulmonary artery, the interlobar pulmonary artery, the RIGHT UPPER LOBE pulmonary artery, the RIGHT MIDDLE and RIGHT LOWER LOBE pulmonary arteries and their branches, the LEFT LOWER LOBE pulmonary artery and its branches, and minimally in the LEFT UPPER LOBE pulmonary artery. Clot burden is large. No evidence of right heart strain currently as the RV/LV ratio is approximately 0.86. 2. A primary malignancy is not clearly identified on the current examination. 3. Necrotic approximate 2 cm right paraesophageal mediastinal lymph node and approximate 2 cm left paratracheal mediastinal lymph node. No pathologic lymphadenopathy elsewhere in the chest, abdomen or pelvis. 4. Approximate 8 mm pleural based nodule in the lateral aspect of the SUPERIOR segment LEFT LOWER LOBE and approximate 4 mm nodule in the LEFT UPPER LOBE. No nodules elsewhere in either lung. 5. Severe chronic pulmonary fibrosis. No acute cardiopulmonary disease. 6. No acute abnormalities involving the abdomen or pelvis. 7. Diffuse hepatic steatosis without evidence of hepatic mass. 8. Elongated and tortuous sigmoid colon without evidence of a colonic mass. Aortic Atherosclerosis (ICD10-170.0) One might consider biopsy of the skull mass in order to establish the source of the malignancy. These critical/emergent results were discussed directly by telephone with the hospitalist caring for the patient at the time of interpretation on 06/25/2018 at 4:12 p.m. Electronically Signed   By: Evangeline Dakin M.D.   On: 06/25/2018 16:19     LOS: 4 days   Time spent: More than 50% of that time was spent in counseling and/or coordination of care.  Antonieta Pert, MD [facility]  06/27/2018,  9:45 AM

## 2018-06-27 NOTE — Telephone Encounter (Signed)
Received a staff msg from Dr. Benay Spice to schedule a hosp fu appt. Appt has been scheduled on 5/4 at 9am w/Dr. Benay Spice.

## 2018-06-28 ENCOUNTER — Telehealth: Payer: Self-pay | Admitting: *Deleted

## 2018-06-28 ENCOUNTER — Telehealth: Payer: Self-pay | Admitting: Cardiology

## 2018-06-28 LAB — CBC
HCT: 38.4 % — ABNORMAL LOW (ref 39.0–52.0)
Hemoglobin: 12.8 g/dL — ABNORMAL LOW (ref 13.0–17.0)
MCH: 33.6 pg (ref 26.0–34.0)
MCHC: 33.3 g/dL (ref 30.0–36.0)
MCV: 100.8 fL — ABNORMAL HIGH (ref 80.0–100.0)
Platelets: 181 10*3/uL (ref 150–400)
RBC: 3.81 MIL/uL — ABNORMAL LOW (ref 4.22–5.81)
RDW: 14.7 % (ref 11.5–15.5)
WBC: 10.5 10*3/uL (ref 4.0–10.5)
nRBC: 0 % (ref 0.0–0.2)

## 2018-06-28 LAB — CEA: CEA: 1.4 ng/mL (ref 0.0–4.7)

## 2018-06-28 LAB — HEPARIN LEVEL (UNFRACTIONATED): Heparin Unfractionated: 0.56 IU/mL (ref 0.30–0.70)

## 2018-06-28 MED ORDER — APIXABAN 5 MG PO TABS: ORAL_TABLET | ORAL | 1 refills | Status: DC

## 2018-06-28 MED ORDER — ASPIRIN EC 81 MG PO TBEC: 81.0000 mg | DELAYED_RELEASE_TABLET | Freq: Every day | ORAL | 0 refills | Status: AC

## 2018-06-28 NOTE — Progress Notes (Signed)
ANTICOAGULATION CONSULT NOTE - Follow Up Consult  Pharmacy Consult for Heparin>>Eliquis 4/28 Indication: pulmonary embolus  No Known Allergies  Patient Measurements: Height: 5\' 9"  (175.3 cm) Weight: 178 lb (80.7 kg) IBW/kg (Calculated) : 70.7 Heparin Dosing Weight: 80.5 kf  Vital Signs: Temp: 97.6 F (36.4 C) (04/28 0619) Temp Source: Oral (04/27 2115) BP: 117/62 (04/28 0619) Pulse Rate: 54 (04/28 0619)  Labs: Recent Labs    06/26/18 0453  06/26/18 1653 06/27/18 0432 06/27/18 0853 06/28/18 0341  HGB 12.5*  --   --  12.6* 12.9* 12.8*  HCT 36.8*  --   --  38.2* 39.0 38.4*  PLT 143*  --   --  165 172 181  LABPROT 14.7  --   --   --   --   --   INR 1.2  --   --   --   --   --   HEPARINUNFRC  --    < > 0.43  --  0.60 0.56   < > = values in this interval not displayed.    Estimated Creatinine Clearance: 73.9 mL/min (by C-G formula based on SCr of 0.85 mg/dL).   Assessment:  Anticoag: NSTEMI s/p cath now 4/25: CT now shows PE and pleural nodules. Restart heparin. HL 0.56 in goal. Hgb 12.8 and plts 181 both stable. Change to Eliquis today in preparation for discharge. Pt educated 4/27.   Goal of Therapy:  Heparin level 0.3-0.7 units/ml Monitor platelets by anticoagulation protocol: Yes   Plan:  - d/c IV heparin this AM  -- 4/28: Start Eliquis 10mg  BID x 7d, then 5mg  BID  Mubashir Mallek S. Alford Highland, PharmD, BCPS Clinical Staff Pharmacist Eilene Ghazi Stillinger 06/28/2018,9:12 AM

## 2018-06-28 NOTE — Telephone Encounter (Signed)
New Message    Pts daughter is calling because when they spoke with the company today they said it would take 3-5 business days for them to get the monitor and they thought it would be there sooner    Please call

## 2018-06-28 NOTE — Discharge Summary (Signed)
Physician Discharge Summary  Nicholas Webb URK:[id number] DOB: [birth year] DOA: 06/23/2018  PCP: Maurice Small, MD  Admit date: 06/23/2018 Discharge date: 06/28/2018  Admitted From: Home Disposition: Home  Recommendations for Outpatient Follow-up:  1. Follow up with PCP/oncology in 1-2 weeks 2. Please obtain BMP/CBC in one week 3. Please follow up on the following pending results:  Home Health: No Equipment/Devices: None  Discharge Condition: Stable CODE STATUS: Full code Diet recommendation: Cardiac diet  Brief/Interim Summary: 78 y.o.male  W/ hX of Barrets/GERD,steoarthritis/RA, hyperlipidemia came to the hospital with complains of chest pain that started While he was cutting grass 4/22, substernal, squeezing in nature without associated shortness of breath.  After rest pain resolved but would come back with activity.Patient also felt lightheaded dizzy with presyncopal episode on the morning of 4/23. Recently as outpatient has been evaluated by a neurologist for headaches and has been labeled as tension headache.Currently he is not having 1. In the ER his troponins were elevated at 0.3. EKG did not show any acute ST-T changes but there was concerns of non-STEMI therefore cardiology team was consulted and started on heparin drip. CT of the head showedAbnormal destructive skull lesion. Patient was admitted underwent cardiac cath with stent placement.For his abnormal CT scan MRI brain seen by neurosurgery recommended to continue work-up for underlying malignancy. 4/25:CT chest showed bilateral pulmonary embolism, patient placed back on heparin drip. 4/27:DVT w + Lft femoral DVT. Patient doing well transition to oral Eliquis. Patient has remained asymptomatic, is being discharged home with instruction to follow-up with PCP cardiology and oncology.  Discharge Diagnoses:   NSTEMI status post cath with proximal LAD lesion 99% stenosed, s/p DES. Cont on aspirin/Brilinta, statin,  beta-blocker. Cardio s/o.  Syncope: Unclear etiology.Could be secondary to patient's PE. He is being scheduled for outpatient cardiac monitoring device.Advised not to drive for 6 months as per state law patient verbalized understanding. Sinus bradycardia in monitor- but since 4/26 lowest HR was 57 mostly in 60-70s. Cont low dose metoprolol for #1 w holding parameters  Skull mass noted in CT/MRI:Patient has had about 10 pound weight loss unclear what timeframe, no prior history of malignancy in family or himself .GBT:[id number] and poorly marginated tumor with epicenter in the bone and extension both the underlying pachymeninges and superficially into the scalp.Top differential considerations include metastasis, meningioma, and lymphoma." Neurosurgery consulted, appreciate input-psa is normal, thyroid USw nodule needing 1 y follow up. CT chest/abd/pelvis done NO OBVIOUS primary.sene by Dr Learta Codding. Has hx of Barrets, also on methotrexate and humira at risk for lymphoma.ldh, CE pending.Discussed with Dr Rosario Jacks for o/p PET scan  and take it from there.  Acute bilateral PE w large clot burden seen in CTA incidentally distal right main pulmonary:Discussed w Dr Margaretann Loveless from cardio- cont heparin gtt for now and recommends to cont triple therapy for 1 month then drop aspirin and after 1 year switch brilinta to aspirin. Spoke w Neurosurgery team [place]: they are okay to anticoagualtion as above as it is skull mass and they plan to follow as outpatient on non-emergent basis for skull mass. Continued on heparin gtt -switched to Eliquis 4/28 and tolerated wellI discussed risks/benefits w patient and agrees with current therapy, and plan to switch to Eliquis in the morning.  Acute DVT Left femoral vein:Cont anticoagualtion as above.   RA,stable on humira and methotrexate.  Severe chronic pulmonary fibrosis incidentally noted in CTA chest. w RA. Pt not aware. Advised o//p follow up.  GERD w hx of  Barrets esophagitis:c/w  ppi.  Generalized OA:prn tylenol.  HLD added statins.  DVT prophylaxis:Heparin. Code Status:Full Family Communication:Discussed the plan of care with the patient in detail, reviewed imaging, labs, CT findings. I had called and updated his daughter who is a PA w [employer] int medicine 4/25 and 4/27 reg plan in detail and agrees with current plan of care and anticoagulation , prefers eliquis Discussed w oncology.  Disposition Plan:  home   Consultants:  Neurosurgery Cardiology Oncology  Procedures: [facility] 06/24/18  Mid RCA lesion is 20% stenosed.  Prox Cx to Mid Cx lesion is 20% stenosed.  Prox LAD lesion is 99% stenosed.  A drug-eluting stent was successfully placed using a STENT RESOLUTE ONYX 3.0X18.  Post intervention, there is a 0% residual stenosis.  Ost 1st Diag lesion is 80% stenosed.  1. Severe stenosis proximal to mid LAD 2. Successful PTCA/DES x 1 proximal LAD 3. Mild non-obstructive disease in the Circumflex and RCA  CTA Chest/Abdomen pelvis  1 Acute pulmonary emboli involving the distal right main pulmonary artery, the interlobar pulmonary artery, the RIGHT UPPER LOBE pulmonary artery, the RIGHT MIDDLE and RIGHT LOWER LOBE pulmonary arteries and their branches, the LEFT LOWER LOBE pulmonary artery and its branches, and minimally in the LEFT UPPER LOBE pulmonary artery. Clot burden is large. No evidence of right heart strain currently as the RV/LV ratio is approximately 0.86. 2. A primary malignancy is not clearly identified on the current examination. 3. Necrotic approximate 2 cm right paraesophageal mediastinal lymph node and approximate 2 cm left paratracheal mediastinal lymph node. No pathologic lymphadenopathy elsewhere in the chest, abdomen or pelvis. 4. Approximate 8 mm pleural based nodule in the lateral aspect of the SUPERIOR segment LEFT LOWER LOBE and approximate 4 mm nodule in the LEFT UPPER LOBE. No nodules  elsewhere in either lung. 5. Severe chronic pulmonary fibrosis. No acute cardiopulmonary disease. 6. No acute abnormalities involving the abdomen or pelvis. 7. Diffuse hepatic steatosis without evidence of hepatic mass. 8. Elongated and tortuous sigmoid colon without evidence of a colonic mass.  4/27 DUPLEX LEG  Left: Findings consistent with acute deep vein thrombosis involving the left femoral vein. No cystic structure found in the popliteal fossa  Discharge Instructions  Discharge Instructions    Amb Referral to Cardiac Rehabilitation   Complete by:  As directed    Diagnosis:   Coronary Stents PTCA NSTEMI     Diet - low sodium heart healthy   Complete by:  As directed    Diet - low sodium heart healthy   Complete by:  As directed    Discharge instructions   Complete by:  As directed    Please call call MD or return to ER for similar recurring problem, nausea/vomiting, uncontrolled pain, abdominal pain chest pain, shortness of breath, fever. Please follow-up with oncology clinic as instructed, call the number if you are not called in next 3 to 4 days.   Discharge instructions   Complete by:  As directed    Please call call MD or return to ER for similar recurring problem, nausea/vomiting, uncontrolled pain, abdominal pain chest pain, shortness of breath, fever. Please follow-up your doctor as instructed with Dr Ammie Dalton from oncology for PET scan  Please avoid alcohol, smoking   Increase activity slowly   Complete by:  As directed    Increase activity slowly   Complete by:  As directed      Allergies as of 06/28/2018   No Known Allergies     Medication List  TAKE these medications   apixaban 5 MG Tabs tablet Commonly known as:  ELIQUIS 10 MG po BID X 13 doses (to complete 1 week) then 5 mg po BID   aspirin EC 81 MG tablet Take 1 tablet (81 mg total) by mouth daily for 30 days.   atorvastatin 80 MG tablet Commonly known as:  LIPITOR Take 1 tablet (80 mg  total) by mouth daily at 6 PM for 30 days.   esomeprazole 20 MG capsule Commonly known as:  NEXIUM Take 20 mg by mouth daily at 12 noon.   folic acid 1 MG tablet Commonly known as:  FOLVITE Take 2 mg by mouth daily.   Humira 40 MG/0.8ML Pskt Generic drug:  Adalimumab Inject 1 each into the skin See admin instructions. Every 2 weeks   methotrexate 2.5 MG tablet Commonly known as:  RHEUMATREX Take 30-37.5 mg by mouth See admin instructions. Take 37.5mg  on Sundays and 30mg  on Mondays   metoprolol tartrate 25 MG tablet Commonly known as:  LOPRESSOR Take 0.5 tablets (12.5 mg total) by mouth 2 (two) times daily for 30 days.   multivitamin with minerals Tabs tablet Take 1 tablet by mouth daily.   naproxen sodium 220 MG tablet Commonly known as:  ALEVE Take 220 mg by mouth.   ticagrelor 90 MG Tabs tablet Commonly known as:  BRILINTA Take 1 tablet (90 mg total) by mouth 2 (two) times daily.   tizanidine 2 MG capsule Commonly known as:  ZANAFLEX TAKE 1 CAPSULE(2 MG) BY MOUTH AT BEDTIME What changed:  See the new instructions.      Follow-up Information    Jerline Pain, MD Follow up.   Specialty:  Cardiology Why:  Our office will call you to set up a virtual follow-up visit and discuss outpatient monitor. If you do not hear from our office by the end of Tuesday 06/28/2018.  Contact information: [address] [place] [address] [phone]        Magrinat, Virgie Dad, MD. Call in 1 week(s).   Specialty:  Oncology Why:  call office no if you do not hear from the office in the next 3 to 4 days Contact information: [place] [address] [phone]          No Known Allergies  Consultations:  Cardiology specify Physician/Group   Procedures/Studies: Dg Chest 2 View  Result Date: 06/23/2018 CLINICAL DATA:  Shortness of breath. EXAM: CHEST - 2 VIEW COMPARISON:  04/11/2017. FINDINGS: Borderline cardiomegaly.  Pleuroparenchymal scarring bilaterally is stable. No consolidation or edema. No effusion or pneumothorax. Bones unremarkable. IMPRESSION: Stable chest. Electronically Signed   By: Staci Righter M.D.   On: 06/23/2018 20:07   Ct Head Wo Contrast  Result Date: 06/23/2018 CLINICAL DATA:  Vertigo.  Recent fall.  History of BPPV. EXAM: CT HEAD WITHOUT CONTRAST TECHNIQUE: Contiguous axial images were obtained from the base of the skull through the vertex without intravenous contrast. COMPARISON:  CT head 08/12/2015. FINDINGS: Brain: No evidence for acute infarction, hemorrhage, mass lesion, hydrocephalus, or extra-axial fluid. Generalized atrophy. Hypoattenuation of white matter likely small vessel disease. In the LEFT parietal parasagittal vertex, there is a destructive lesion of the skull. There is both intracranial and extracranial extension, with a 3 mm thick epidural component seen best on sagittal series 6 image 33. Vascular: Calcification of the cavernous internal carotid arteries consistent with cerebrovascular atherosclerotic disease. No signs of intracranial large vessel occlusion. Skull: Destructive skull lesion, LEFT  parasagittal parietal bone, with largest confluent area destruction 15 x 18 x 14 mm. Lesion is contiguous with the superior sagittal sinus but I do not believe that it is venous in origin. A second lesion is seen more caudally in the midline, involving the occipital bone, again with intracranial extension. Subgaleal extension is most prominent at the LEFT parietal region. No fracture related to the recent fall. Sinuses/Orbits: Paranasal sinuses are clear. No orbital findings. Slight frontal soft tissue swelling. Other: Compared with previous CT from 2017, the skull was normal. IMPRESSION: 1. Destructive skull lesion, LEFT parasagittal parietal bone, with intracranial and extracranial extension. Smaller lesion also involving the occipital bone in the midline. The lesions are contiguous with the  superior sagittal sinus but I do not favor that it is venous in origin. Concern for osseous spread of unknown malignancy. Aggressive pacchionian granulations cannot completely be excluded, but would appear unlikely given the normal skull just 3 years earlier. 2. Atrophy and small vessel disease. No acute intracranial findings. 3. No skull fracture or intracranial hemorrhage. 4. If further investigation desired, MRI of the brain without and with contrast recommended for further evaluation. Electronically Signed   By: Staci Righter M.D.   On: 06/23/2018 17:43   Ct Chest W Contrast  Result Date: 06/25/2018 CLINICAL DATA:  78 year old with a mass involving the skull with extension into the leptomeninges and the scalp, possibly a metastasis. This examination is performed in order to potentially identify a primary malignancy. EXAM: CT CHEST, ABDOMEN, AND PELVIS WITH CONTRAST TECHNIQUE: Multidetector CT imaging of the chest, abdomen and pelvis was performed following the standard protocol during bolus administration of intravenous contrast. Oral contrast was also administered. CONTRAST:  146mL OMNIPAQUE IOHEXOL 300 MG/ML IV. COMPARISON:  No prior chest or abdominopelvic CT. The MRI brain performed yesterday is correlated. FINDINGS: CT CHEST FINDINGS Cardiovascular: Normal heart size. LAD coronary stent. No pericardial effusion. Filling defects within the distal RIGHT main pulmonary artery extending into the RIGHT UPPER LOBE pulmonary artery, the intralobar pulmonary artery and branches of the RIGHT MIDDLE LOBE and RIGHT LOWER LOBE pulmonary artery. Filling defects within the proximal LEFT UPPER LOBE pulmonary artery and the distal LEFT LOWER LOBE pulmonary artery and several of its branches. No evidence of RIGHT heart strain currently as the RV/LV ratio measures approximately 0.86. Mild atherosclerosis involving the thoracic aorta without evidence of aneurysm. Visualized proximal great vessels widely patent.  Mediastinum/Nodes: Necrotic approximate 1.1 x 2.0 cm RIGHT paraesophageal mediastinal lymph node (station 8), series 3, image 33. Approximate 1.6 x 2.1 cm LEFT paratracheal mediastinal node (station 4 L), series 3, image 22. Multiple normal sized lymph nodes elsewhere throughout the mediastinum. Normal sized BILATERAL hilar lymph nodes. Normal axillary lymph nodes. No visible supraclavicular lymphadenopathy. Normal appearing esophagus. Normal-appearing thyroid gland. Lungs/Pleura: Severe changes of peripheral pulmonary fibrosis throughout both lungs, with LOWER zonal predominance. Approximate 4 x 5 mm (5 mm mean) nodule in the LEFT UPPER LOBE on image 38 of series 5. Approximate 8 x 8 mm (8 mm mean) pleural based nodule laterally in the SUPERIOR segment LEFT LOWER LOBE on image 67 of series 5. No parenchymal nodules or masses elsewhere in either lung. No confluent or ground-glass airspace consolidation in either lung. No pleural effusions. Central airways patent. Mild BILATERAL lower lobe bronchiectasis. Musculoskeletal: Degenerative disc disease and spondylosis involving the visualized LOWER cervical spine and throughout the thoracic spine. No acute findings. No evidence of osseous metastatic disease. CT ABDOMEN PELVIS FINDINGS Hepatobiliary: Diffuse hepatic steatosis without  evidence of a hepatic mass. Gallbladder normal in appearance without calcified gallstones. No biliary ductal dilation. Pancreas: Normal in appearance without evidence of mass, ductal dilation, or inflammation. Spleen: Normal in size and appearance. Adrenals/Urinary Tract: Normal appearing adrenal glands. Subcentimeter cortical cysts involving both kidneys. No solid renal masses. No urinary tract calculi. No hydronephrosis. Normal appearing urinary bladder. Stomach/Bowel: Stomach normal in appearance for the degree of distention. Normal-appearing small bowel. The sigmoid colon is elongated and tortuous and extends well up into the RIGHT UPPER  QUADRANT of the abdomen beneath the RIGHT hemidiaphragm. Moderate stool burden is present throughout the colon. A colonic mass is not identified. Appendix is not conspicuous but there is no pericecal inflammation. Vascular/Lymphatic: Mild aortoiliofemoral atherosclerosis without evidence of aneurysm. Normal-appearing portal venous and systemic venous systems. No pathologic lymphadenopathy. Reproductive: Mild median lobe prostate gland enlargement. Scattered prostatic calcifications. Normal seminal vesicles. Other: Phleboliths low in both sides of the pelvis. Musculoskeletal: Lumbar levoscoliosis. Multilevel degenerative disc disease, spondylosis and facet degenerative changes. No acute findings. No evidence of osseous metastatic disease. IMPRESSION: 1. Acute pulmonary emboli involving the distal right main pulmonary artery, the interlobar pulmonary artery, the RIGHT UPPER LOBE pulmonary artery, the RIGHT MIDDLE and RIGHT LOWER LOBE pulmonary arteries and their branches, the LEFT LOWER LOBE pulmonary artery and its branches, and minimally in the LEFT UPPER LOBE pulmonary artery. Clot burden is large. No evidence of right heart strain currently as the RV/LV ratio is approximately 0.86. 2. A primary malignancy is not clearly identified on the current examination. 3. Necrotic approximate 2 cm right paraesophageal mediastinal lymph node and approximate 2 cm left paratracheal mediastinal lymph node. No pathologic lymphadenopathy elsewhere in the chest, abdomen or pelvis. 4. Approximate 8 mm pleural based nodule in the lateral aspect of the SUPERIOR segment LEFT LOWER LOBE and approximate 4 mm nodule in the LEFT UPPER LOBE. No nodules elsewhere in either lung. 5. Severe chronic pulmonary fibrosis. No acute cardiopulmonary disease. 6. No acute abnormalities involving the abdomen or pelvis. 7. Diffuse hepatic steatosis without evidence of hepatic mass. 8. Elongated and tortuous sigmoid colon without evidence of a colonic  mass. Aortic Atherosclerosis (ICD10-170.0) One might consider biopsy of the skull mass in order to establish the source of the malignancy. These critical/emergent results were discussed directly by telephone with the hospitalist caring for the patient at the time of interpretation on 06/25/2018 at 4:12 p.m. Electronically Signed   By: Evangeline Dakin M.D.   On: 06/25/2018 16:19   [name] AO Contrast  Result Date: 06/24/2018 CLINICAL DATA:  78 year old male with headache and destructive lesion at the skull vertex by CT yesterday. EXAM: MRI HEAD WITHOUT AND WITH CONTRAST TECHNIQUE: Multiplanar, multiecho pulse sequences of the brain and surrounding structures were obtained without and with intravenous contrast. CONTRAST:  8 milliliters Gadavist COMPARISON:  Head CT 06/23/2018. FINDINGS: Brain: Poorly delineated and infiltrative appearing lesion of the posterior skull vertex demonstrates replacement of the normal marrow in an area of 6-7 centimeters (series 9, image 13), with associated abnormal diffusion and heterogeneous enhancement of the bone. There is extraosseous extension both intracranially and superficially into the scalp (series 20, image 13) encompassing 16-20 millimeters in thickness. Associated fairly focal pachymeningeal thickening and T2 hyperintense soft tissue mass centered around the posterosuperior sagittal sinus (series 17, image 5, series 18, image 5). Some of the lesion appears centrally necrotic or nonvascular (series 19, image 48). The sagittal sinus is narrow it but remains patent. There is no significant mass  effect on the brain parenchyma at this time, and no cerebral edema identified. Elsewhere the dura appears within normal limits. No leptomeningeal thickening identified. No abnormal parenchymal enhancement of the brain is identified. Cerebral volume is within normal limits. No restricted diffusion to suggest acute infarction. No midline shift,ventriculomegaly, or acute intracranial  hemorrhage. Cervicomedullary junction and pituitary are within normal limits. There are occasional chronic micro hemorrhages in the brain (right parietal lobe series 14, image 35). No cortical encephalomalacia identified and otherwise normal for age gray and white matter signal. Vascular: Major intracranial vascular flow voids are preserved, although the superior sagittal sinus is partially invaded and narrow it by the vertex lesion (series 17 images 4 through 6). The left vertebral artery is dominant. The other major dural venous sinuses are enhancing and appear to be patent. Skull and upper cervical spine: Posterior skull vertex bone lesion described above. Elsewhere visible bone marrow signal is within normal limits. Negative visible cervical spine aside from degenerative changes. Sinuses/Orbits: Negative; prior cataract surgery. Other: Mastoids are clear. There is subtle nodularity and enhancement in the left internal auditory canal (series 10, image 6 and series 19, image 15. This is 2-3 millimeters. The other visible internal auditory structures, including the left cochlea and vestibule appear grossly normal. Normal stylomastoid foramina. Abnormal scalp invasion over the posterior skull vertex as stated above. Other scalp and face soft tissues appear negative. IMPRESSION: 1. 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 No associated cerebral edema. No leptomeningeal involvement or direct brain invasion is suspected. The lesion invades and narrows the superior sagittal sinus, but the sinus remains patent. A 6-7 cm diameter area of the bone and dura appears infiltrated, with the remaining confluent extraosseous component encompassing 2-3 cm. Top differential considerations include metastasis, meningioma, and lymphoma. 2. 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 Dedicated IAC protocol brain MRI would better characterize this lesion in the future. 3. No other bone lesion and no other acute or neoplastic process identified in the head. There are occasional chronic cerebral microhemorrhages. Electronically Signed   By: Genevie Ann M.D.   On: 06/24/2018 06:24   Ct Abdomen Pelvis W Contrast  Result Date: 06/25/2018 CLINICAL DATA:  78 year old with a mass involving the skull with extension into the leptomeninges and the scalp, possibly a metastasis. This examination is performed in order to potentially identify a primary malignancy. EXAM: CT CHEST, ABDOMEN, AND PELVIS WITH CONTRAST TECHNIQUE: Multidetector CT imaging of the chest, abdomen and pelvis was performed following the standard protocol during bolus administration of intravenous contrast. Oral contrast was also administered. CONTRAST:  166mL OMNIPAQUE IOHEXOL 300 MG/ML IV. COMPARISON:  No prior chest or abdominopelvic CT. The MRI brain performed yesterday is correlated. FINDINGS: CT CHEST FINDINGS Cardiovascular: Normal heart size. LAD coronary stent. No pericardial effusion. Filling defects within the distal RIGHT main pulmonary artery extending into the RIGHT UPPER LOBE pulmonary artery, the intralobar pulmonary artery and branches of the RIGHT MIDDLE LOBE and RIGHT LOWER LOBE pulmonary artery. Filling defects within the proximal LEFT UPPER LOBE pulmonary artery and the distal LEFT LOWER LOBE pulmonary artery and several of its branches. No evidence of RIGHT heart strain currently as the RV/LV ratio measures approximately 0.86. Mild atherosclerosis involving the thoracic aorta without evidence of aneurysm. Visualized proximal great vessels widely patent. Mediastinum/Nodes: Necrotic approximate 1.1 x 2.0 cm RIGHT paraesophageal mediastinal lymph  node (station 8), series 3, image 33. Approximate 1.6 x 2.1 cm LEFT paratracheal mediastinal node (station 4 L), series 3, image 22. Multiple normal sized  lymph nodes elsewhere throughout the mediastinum. Normal sized BILATERAL hilar lymph nodes. Normal axillary lymph nodes. No visible supraclavicular lymphadenopathy. Normal appearing esophagus. Normal-appearing thyroid gland. Lungs/Pleura: Severe changes of peripheral pulmonary fibrosis throughout both lungs, with LOWER zonal predominance. Approximate 4 x 5 mm (5 mm mean) nodule in the LEFT UPPER LOBE on image 38 of series 5. Approximate 8 x 8 mm (8 mm mean) pleural based nodule laterally in the SUPERIOR segment LEFT LOWER LOBE on image 67 of series 5. No parenchymal nodules or masses elsewhere in either lung. No confluent or ground-glass airspace consolidation in either lung. No pleural effusions. Central airways patent. Mild BILATERAL lower lobe bronchiectasis. Musculoskeletal: Degenerative disc disease and spondylosis involving the visualized LOWER cervical spine and throughout the thoracic spine. No acute findings. No evidence of osseous metastatic disease. CT ABDOMEN PELVIS FINDINGS Hepatobiliary: Diffuse hepatic steatosis without evidence of a hepatic mass. Gallbladder normal in appearance without calcified gallstones. No biliary ductal dilation. Pancreas: Normal in appearance without evidence of mass, ductal dilation, or inflammation. Spleen: Normal in size and appearance. Adrenals/Urinary Tract: Normal appearing adrenal glands. Subcentimeter cortical cysts involving both kidneys. No solid renal masses. No urinary tract calculi. No hydronephrosis. Normal appearing urinary bladder. Stomach/Bowel: Stomach normal in appearance for the degree of distention. Normal-appearing small bowel. The sigmoid colon is elongated and tortuous and extends well up into the RIGHT UPPER QUADRANT of the abdomen beneath the RIGHT hemidiaphragm. Moderate stool burden is present throughout the colon. A colonic mass is not identified. Appendix is not conspicuous but there is no pericecal inflammation. Vascular/Lymphatic: Mild  aortoiliofemoral atherosclerosis without evidence of aneurysm. Normal-appearing portal venous and systemic venous systems. No pathologic lymphadenopathy. Reproductive: Mild median lobe prostate gland enlargement. Scattered prostatic calcifications. Normal seminal vesicles. Other: Phleboliths low in both sides of the pelvis. Musculoskeletal: Lumbar levoscoliosis. Multilevel degenerative disc disease, spondylosis and facet degenerative changes. No acute findings. No evidence of osseous metastatic disease. IMPRESSION: 1. Acute pulmonary emboli involving the distal right main pulmonary artery, the interlobar pulmonary artery, the RIGHT UPPER LOBE pulmonary artery, the RIGHT MIDDLE and RIGHT LOWER LOBE pulmonary arteries and their branches, the LEFT LOWER LOBE pulmonary artery and its branches, and minimally in the LEFT UPPER LOBE pulmonary artery. Clot burden is large. No evidence of right heart strain currently as the RV/LV ratio is approximately 0.86. 2. A primary malignancy is not clearly identified on the current examination. 3. Necrotic approximate 2 cm right paraesophageal mediastinal lymph node and approximate 2 cm left paratracheal mediastinal lymph node. No pathologic lymphadenopathy elsewhere in the chest, abdomen or pelvis. 4. Approximate 8 mm pleural based nodule in the lateral aspect of the SUPERIOR segment LEFT LOWER LOBE and approximate 4 mm nodule in the LEFT UPPER LOBE. No nodules elsewhere in either lung. 5. Severe chronic pulmonary fibrosis. No acute cardiopulmonary disease. 6. No acute abnormalities involving the abdomen or pelvis. 7. Diffuse hepatic steatosis without evidence of hepatic mass. 8. Elongated and tortuous sigmoid colon without evidence of a colonic mass. Aortic Atherosclerosis (ICD10-170.0) One might consider biopsy of the skull mass in order to establish the source of the malignancy. These critical/emergent results were discussed directly by telephone with the hospitalist caring for  the patient at the time of interpretation on 06/25/2018 at 4:12 p.m. Electronically Signed   By: Evangeline Dakin M.D.   On:  06/25/2018 16:19   US Thyroid  Result Date: 06/25/2018 CLINICAL DATA:  Other.  Intracranial mass. EXAM: THYROID ULTRASOUND TECHNIQUE: Ultrasound examination of the thyroid gland and adjacent soft tissues was performed. COMPARISON:  None. FINDINGS: Parenchymal Echotexture: Mildly heterogenous Isthmus: 0.1 cm Right lobe: 2.8 x 1.5 x 1.3 cm Left lobe: 3.2 x 1.5 x 1.3 cm _________________________________________________________ Estimated total number of nodules >/= 1 cm: 1 Number of spongiform nodules >/=  2 cm not described below (TR1): 0 Number of mixed cystic and solid nodules >/= 1.5 cm not described below (TR2): 0 _________________________________________________________ Nodule # 1: Location: Left; Superior Maximum size: 1.4 cm; Other 2 dimensions: 0.5 x 0.8 cm Composition: solid/almost completely solid (2) Echogenicity: hypoechoic (2) Shape: not taller-than-wide (0) Margins: ill-defined (0) Echogenic foci: none (0) ACR TI-RADS total points: 4. ACR TI-RADS risk category: TR4 (4-6 points). ACR TI-RADS recommendations: *Given size (>/= 1 - 1.4 cm) and appearance, a follow-up ultrasound in 1 year should be considered based on TI-RADS criteria. _________________________________________________________ Two other hyperechoic areas measured in both right and left lobes do not have the appearance of discrete nodules and are consistent with hyperechoic islands of tissue that appear benign (white knight nodules). No abnormal lymph nodes identified. IMPRESSION: 1.4 cm left superior hypoechoic nodule meets criteria for 1 year follow-up ultrasound. The above is in keeping with the ACR TI-RADS recommendations - J Am Coll Radiol 2017;14:587-595. Electronically Signed   By: Aletta Edouard M.D.   On: 06/25/2018 08:08   Vas [place] Lower Extremity Venous (dvt)  Result Date: 06/27/2018  Lower Venous Study  Indications: Pulmonary embolism.  Performing Technologist: Oliver Hum RVT  Examination Guidelines: A complete evaluation includes B-mode imaging, spectral Doppler, color Doppler, and power Doppler as needed of all accessible portions of each vessel. Bilateral testing is considered an integral part of a complete examination. Limited examinations for reoccurring indications may be performed as noted.  +---------+---------------+---------+-----------+----------+-------+ RIGHT    CompressibilityPhasicitySpontaneityPropertiesSummary +---------+---------------+---------+-----------+----------+-------+ CFV      Full           Yes      Yes                          +---------+---------------+---------+-----------+----------+-------+ SFJ      Full                                                 +---------+---------------+---------+-----------+----------+-------+ FV Prox  Full                                                 +---------+---------------+---------+-----------+----------+-------+ FV Mid   Full                                                 +---------+---------------+---------+-----------+----------+-------+ FV DistalFull                                                 +---------+---------------+---------+-----------+----------+-------+ PFV  Full                                                 +---------+---------------+---------+-----------+----------+-------+ POP      Full           Yes      Yes                          +---------+---------------+---------+-----------+----------+-------+ PTV      Full                                                 +---------+---------------+---------+-----------+----------+-------+ PERO     Full                                                 +---------+---------------+---------+-----------+----------+-------+   +---------+---------------+---------+-----------+----------+-------+ LEFT      CompressibilityPhasicitySpontaneityPropertiesSummary +---------+---------------+---------+-----------+----------+-------+ CFV      Full           Yes      Yes                          +---------+---------------+---------+-----------+----------+-------+ SFJ      Full                                                 +---------+---------------+---------+-----------+----------+-------+ FV Prox  Partial        No       No                   Acute   +---------+---------------+---------+-----------+----------+-------+ FV Mid   None           No       No                   Acute   +---------+---------------+---------+-----------+----------+-------+ FV DistalFull                                                 +---------+---------------+---------+-----------+----------+-------+ PFV      Full                                                 +---------+---------------+---------+-----------+----------+-------+ POP      Full           No       No                           +---------+---------------+---------+-----------+----------+-------+ PTV      Full                                                 +---------+---------------+---------+-----------+----------+-------+  PERO     Full                                                 +---------+---------------+---------+-----------+----------+-------+     Summary: Right: There is no evidence of deep vein thrombosis in the lower extremity. No cystic structure found in the popliteal fossa. Left: Findings consistent with acute deep vein thrombosis involving the left femoral vein. No cystic structure found in the popliteal fossa.  *See table(s) above for measurements and observations. Electronically signed by Ruta Hinds MD on 06/27/2018 at 5:55:37 PM.    Final       Subjective: Resting well.  Asymptomatic.  No nausea vomiting chest pain shortness of breath.  Wants to go home today.  Discharge Exam: Vitals:    06/27/18 2115 06/28/18 0619  BP: 120/74 117/62  Pulse: 68 (!) 54  Resp: 17   Temp: 97.9 F (36.6 C) 97.6 F (36.4 C)  SpO2: 95% 99%   Vitals:   06/26/18 1953 06/27/18 0439 06/27/18 2115 06/28/18 0619  BP: 110/84 103/67 120/74 117/62  Pulse: 75 66 68 (!) 54  Resp: 16 18 17    Temp: 98.1 F (36.7 C) 97.7 F (36.5 C) 97.9 F (36.6 C) 97.6 F (36.4 C)  TempSrc: Oral Oral Oral   SpO2: 94% 96% 95% 99%  Weight:  80.5 kg  80.7 kg  Height:        General: Pt is alert, awake, not in acute distress Cardiovascular: RRR, S1/S2 +, no rubs, no gallops Respiratory: CTA bilaterally, no wheezing, no rhonchi Abdominal: Soft, NT, ND, bowel sounds + Extremities: no edema, no cyanosis   The results of significant diagnostics from this hospitalization (including imaging, microbiology, ancillary and laboratory) are listed below for reference.     Microbiology: No results found for this or any previous visit (from the past 240 hour(s)).   Labs: BNP (last 3 results) Recent Labs    06/23/18 1933  BNP 563.8*   Basic Metabolic Panel: Recent Labs  Lab 06/23/18 1701 06/24/18 0234 06/25/18 0403  NA 140 140 139  K 4.2 4.0 3.9  CL 103 107 107  CO2 22 23 24   GLUCOSE 135* 111* 106*  BUN 22 25* 14  CREATININE 1.04 1.00 0.85  CALCIUM 9.5 8.9 8.9  MG 2.0 1.9  --    Liver Function Tests: Recent Labs  Lab 06/24/18 0234  AST 40  ALT 49*  ALKPHOS 67  BILITOT 0.8  PROT 6.4*  ALBUMIN 3.2*   No results for input(s): LIPASE, AMYLASE in the last 168 hours. No results for input(s): AMMONIA in the last 168 hours. CBC: Recent Labs  Lab 06/23/18 1701  06/25/18 0403 06/26/18 0453 06/27/18 0432 06/27/18 0853 06/28/18 0341  WBC 7.9   < > 9.5 8.7 10.3 8.7 10.5  NEUTROABS 5.7  --   --   --   --   --   --   HGB 13.4   < > 11.7* 12.5* 12.6* 12.9* 12.8*  HCT 41.4   < > 35.0* 36.8* 38.2* 39.0 38.4*  MCV 103.5*   < > 101.7* 100.5* 101.3* 100.8* 100.8*  PLT 158   < > 136* 143* 165 172 181    < > = values in this interval not displayed.   Cardiac Enzymes: Recent Labs  Lab 06/23/18 1701 06/23/18 1932 06/24/18 0234  06/24/18 0649  TROPONINI 0.32* 0.33* 0.25* 0.17*   BNP: Invalid input(s): POCBNP CBG: No results for input(s): GLUCAP in the last 168 hours. D-Dimer No results for input(s): DDIMER in the last 72 hours. Hgb A1c No results for input(s): HGBA1C in the last 72 hours. Lipid Profile No results for input(s): CHOL, HDL, LDLCALC, TRIG, CHOLHDL, LDLDIRECT in the last 72 hours. Thyroid function studies No results for input(s): TSH, T4TOTAL, T3FREE, THYROIDAB in the last 72 hours.  Invalid input(s): FREET3 Anemia work up No results for input(s): VITAMINB12, FOLATE, FERRITIN, TIBC, IRON, RETICCTPCT in the last 72 hours. Urinalysis    Component Value Date/Time   COLORURINE YELLOW 01/03/2008 1019   APPEARANCEUR CLEAR 01/03/2008 1019   LABSPEC 1.016 01/03/2008 1019   PHURINE 6.5 01/03/2008 1019   GLUCOSEU NEGATIVE 01/03/2008 1019   HGBUR NEGATIVE 01/03/2008 1019   BILIRUBINUR NEGATIVE 01/03/2008 1019   KETONESUR NEGATIVE 01/03/2008 1019   PROTEINUR NEGATIVE 01/03/2008 1019   UROBILINOGEN 0.2 01/03/2008 1019   NITRITE NEGATIVE 01/03/2008 1019   LEUKOCYTESUR  01/03/2008 1019    NEGATIVE MICROSCOPIC NOT DONE ON URINES WITH NEGATIVE PROTEIN, BLOOD, LEUKOCYTES, NITRITE, OR GLUCOSE <1000 mg/dL.   Sepsis Labs Invalid input(s): PROCALCITONIN,  WBC,  LACTICIDVEN Microbiology No results found for this or any previous visit (from the past 240 hour(s)).   Time coordinating discharge: 35 minutes  SIGNED:   Antonieta Pert, MD  [facility] 06/28/2018, 10:37 AM  If 7PM-7AM, please contact night-coverage [email or web address]

## 2018-06-28 NOTE — Telephone Encounter (Signed)
LMAM- Patient enrolled for [employer] to ship a 14 day cardiac event monitor to his home.  [employer] will call today to confirm shipping address and then ship monitor 2nd day air [employer].  Please review instructions in kit, apply monitor, and call [employer] to send your baseline recording.  Their # is [phone].  In 14 days return monitor to [employer] in prepaid UPS box included.  You may contact [employer] to schedule a UPS pickup from your home.

## 2018-06-28 NOTE — Care Management Important Message (Signed)
Important Message  Patient Details  Name: Nicholas Webb MRN: [id number] Date of Birth: [birth year]   Medicare Important Message Given:  Yes    Orbie Pyo 06/28/2018, 3:34 PM

## 2018-06-28 NOTE — Care Management (Signed)
1052 06-28-18 Eliquis 30 day free card provided to patient. No further needs from CM at this time. Bethena Roys, RN,BSN Case Manager [phone]

## 2018-06-29 ENCOUNTER — Telehealth: Payer: Self-pay | Admitting: *Deleted

## 2018-06-29 NOTE — Telephone Encounter (Addendum)
Called patient w/PET appointment at [address] [facility] 07/06/18 at 2:30/3:00 pm NPO 6 hours prior and no carbohydrates for 24 hours prior. He was able to repeat the appointment information and prep back to nurse. Will call at a later date with his office visit appointment. Called patient and left VM with MD visit on 07/07/18 at 10:00 on mobile and home #.

## 2018-07-01 ENCOUNTER — Encounter: Payer: Self-pay | Admitting: Cardiology

## 2018-07-01 ENCOUNTER — Other Ambulatory Visit (INDEPENDENT_AMBULATORY_CARE_PROVIDER_SITE_OTHER): Payer: Medicare Other

## 2018-07-01 DIAGNOSIS — R55 Syncope and collapse: Secondary | ICD-10-CM

## 2018-07-02 ENCOUNTER — Telehealth: Payer: Self-pay | Admitting: Internal Medicine

## 2018-07-02 NOTE — Telephone Encounter (Signed)
Received a call from [employer] regarding monitor for Nicholas Webb. Patient had an 8 beat run of NSVT and rare PVCs. Per [employer], the patient was asymptomatic and up using the restroom at the time. No further intervention needed at this time.

## 2018-07-04 ENCOUNTER — Inpatient Hospital Stay: Payer: Medicare Other | Admitting: Oncology

## 2018-07-04 ENCOUNTER — Telehealth: Payer: Self-pay

## 2018-07-04 NOTE — Telephone Encounter (Signed)
Thank you, on beta-blocker, normal EF.  We will continue to monitor. Candee Furbish, MD

## 2018-07-04 NOTE — Telephone Encounter (Signed)
[employer] sent a transmission from 5:56am eastern time on 07/02/18 showing 8 beat run of VT at rate of 170... pt says he may have gotten up to go the bathroom but denies any symptoms.. no dizziness, palpitations..  Per Dr. Johnsie Cancel.. DOD pt to continue to monitor and will have scanned on to the chart.. pt to continue his meds, metoprolol,  and call if he develops any problems.   Follow up with Dr. Marlou Porch 07/14/18.

## 2018-07-06 ENCOUNTER — Ambulatory Visit (HOSPITAL_COMMUNITY)
Admission: RE | Admit: 2018-07-06 | Discharge: 2018-07-06 | Disposition: A | Discharge status: Home / Self Care | Payer: Medicare Other | Source: Ambulatory Visit | Attending: Oncology | Admitting: Oncology

## 2018-07-06 ENCOUNTER — Other Ambulatory Visit: Payer: Self-pay

## 2018-07-06 DIAGNOSIS — R590 Localized enlarged lymph nodes: Secondary | ICD-10-CM | POA: Diagnosis present

## 2018-07-06 LAB — GLUCOSE, CAPILLARY: Glucose-Capillary: 81 mg/dL (ref 70–99)

## 2018-07-06 MED ORDER — FLUDEOXYGLUCOSE F - 18 (FDG) INJECTION
8.9100 | Freq: Once | INTRAVENOUS | Status: AC
  Administered 2018-07-06: 8.91 via INTRAVENOUS

## 2018-07-07 ENCOUNTER — Inpatient Hospital Stay: Payer: Medicare Other | Attending: Oncology | Admitting: Oncology

## 2018-07-08 ENCOUNTER — Telehealth: Payer: Self-pay | Admitting: Oncology

## 2018-07-08 ENCOUNTER — Telehealth: Payer: Self-pay | Admitting: *Deleted

## 2018-07-08 NOTE — Telephone Encounter (Signed)
Scheduled appt per 5/08 sch message - unable to reach patient . Left message with appt date and time

## 2018-07-08 NOTE — Telephone Encounter (Signed)
Notified of PET results and MD requesting telephone visit with him on 07/12/18 around 1130 or 1200. He agrees. High priority scheduling message sent and he confirms MD should call the mobile #.

## 2018-07-11 ENCOUNTER — Telehealth: Payer: Self-pay | Admitting: *Deleted

## 2018-07-11 ENCOUNTER — Telehealth: Payer: Medicare Other | Admitting: Cardiology

## 2018-07-11 NOTE — Telephone Encounter (Signed)
Confirmed with patient his telephone visit with Dr. Benay Spice at 12:00 tomorrow.

## 2018-07-12 ENCOUNTER — Inpatient Hospital Stay (HOSPITAL_BASED_OUTPATIENT_CLINIC_OR_DEPARTMENT_OTHER): Payer: Medicare Other | Admitting: Oncology

## 2018-07-12 ENCOUNTER — Other Ambulatory Visit: Payer: Self-pay

## 2018-07-12 ENCOUNTER — Other Ambulatory Visit: Payer: Self-pay | Admitting: Student

## 2018-07-12 DIAGNOSIS — I2699 Other pulmonary embolism without acute cor pulmonale: Secondary | ICD-10-CM | POA: Diagnosis not present

## 2018-07-12 DIAGNOSIS — R55 Syncope and collapse: Secondary | ICD-10-CM

## 2018-07-12 DIAGNOSIS — G9389 Other specified disorders of brain: Secondary | ICD-10-CM

## 2018-07-12 DIAGNOSIS — Z955 Presence of coronary angioplasty implant and graft: Secondary | ICD-10-CM | POA: Diagnosis not present

## 2018-07-12 DIAGNOSIS — M069 Rheumatoid arthritis, unspecified: Secondary | ICD-10-CM

## 2018-07-12 DIAGNOSIS — K219 Gastro-esophageal reflux disease without esophagitis: Secondary | ICD-10-CM

## 2018-07-12 NOTE — Progress Notes (Signed)
[facility] OFFICE VISIT PROGRESS NOTE  I connected with@ on 07/12/18 at 12:00 PM EDT by telephone confirming I was speaking with the correct person using two identifiers.     Other persons participating in the visit and their role in the encounter: Wife  Patient's location: Home Provider's location: Home   Diagnosis: Skull mass, DVT/pulmonary embolism  INTERVAL HISTORY:   I saw Mr. Seyler when he was hospital last month after a presyncope event.  He was diagnosed with a myocardial infarction and underwent placement of a coronary stent.  He was found to have bilateral pulmonary emboli on a chest CT and a Doppler confirmed a left lower extremity deep vein thrombosis. 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  CTs revealed chest lymphadenopathy. Mr. Rinkenberger was discharged in the hospital on 06/28/2018.  He reports feeling well.  He has bleeding only with trauma.  Good appetite.  He missed a scheduled appointment with [place] last week. He underwent a staging PET scan on 07/06/2018.  This revealed no evidence of a primary malignancy.  Small pulmonary nodules are not metabolically active and mildly enlarged lymph nodes in the mediastinum have mild hypermetabolism and are favored to be reactive. He reports he is currently receiving using topical 5-fluorouracil for a scalp lesion. Objective:    Medications: I have reviewed the patient's current medications.  Assessment/Plan: 1.Skull mass, chest lymphadenopathy  MRI brain 06/24/2018-posterior vertex infiltrated and poorly marginated mass with extending into the underlying pachymeningeal's and into the scalp, the mass invades and narrows the superior sagittal sinus  CTs of the chest, abdomen, and pelvis on 06/25/2018-acute bilateral pulmonary emboli with a large clot burden, necrotic mediastinal adenopathy, 8 mm pleural-based left lower lobe nodule, 4 mm left upper lobe nodules, chronic  pulmonary fibrosis  PET scan 07/06/2018- no evidence of a primary malignancy, small pulmonary nodules are not metabolically active, mildly enlarged/mildly metabolic mediastinal nodes favored to be reactive 2. 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 Dunwoody was recently diagnosed with bilateral pulmonary emboli and a left leg DVT.  An MRI of the brain when he presented with presyncope revealed a skull base mass.  Neurosurgery and interventional radiology did not feel the mass could be safely biopsied.  CTs and PET imaging have not identified a primary tumor site.  It is possible a scalp skin cancer is present, but I could not appreciate a scalp lesion when I examined him last month.  We will obtain records from his dermatologist.  I will present his case at the GI tumor conference within the next week to discuss the indication for additional imaging and a biopsy.  I reviewed the 07/06/2018 PET images  He will be scheduled for a repeat phone visit in approximately 2 weeks.   I discussed the assessment and treatment plan with the patient. The patient was provided an opportunity to ask questions and all were answered. The patient agreed with the plan and demonstrated an understanding of the instructions.   The patient was advised to call back or seek  an in-person evaluation if the symptoms worsen or if the condition fails to improve as anticipated.   I provided 25 minutes of telephone, chart review, and documentation time during this encounter, and > 50% was spent counseling as documented under my assessment & plan.  [name] ANP/GNP-BC   07/12/2018 12:22 PM

## 2018-07-13 ENCOUNTER — Telehealth: Payer: Self-pay | Admitting: Oncology

## 2018-07-13 NOTE — Telephone Encounter (Signed)
Scheduled appt per 5/12 los. °

## 2018-07-14 ENCOUNTER — Encounter: Payer: Self-pay | Admitting: Cardiology

## 2018-07-14 ENCOUNTER — Telehealth (INDEPENDENT_AMBULATORY_CARE_PROVIDER_SITE_OTHER): Payer: Medicare Other | Admitting: Cardiology

## 2018-07-14 ENCOUNTER — Other Ambulatory Visit: Payer: Self-pay

## 2018-07-14 VITALS — BP 114/68 | HR 67 | Ht 69.0 in | Wt 181.0 lb

## 2018-07-14 DIAGNOSIS — I2699 Other pulmonary embolism without acute cor pulmonale: Secondary | ICD-10-CM | POA: Diagnosis not present

## 2018-07-14 DIAGNOSIS — R000 Tachycardia, unspecified: Secondary | ICD-10-CM | POA: Insufficient documentation

## 2018-07-14 DIAGNOSIS — R55 Syncope and collapse: Secondary | ICD-10-CM

## 2018-07-14 DIAGNOSIS — I2510 Atherosclerotic heart disease of native coronary artery without angina pectoris: Secondary | ICD-10-CM | POA: Insufficient documentation

## 2018-07-14 DIAGNOSIS — I824Z2 Acute embolism and thrombosis of unspecified deep veins of left distal lower extremity: Secondary | ICD-10-CM

## 2018-07-14 DIAGNOSIS — I214 Non-ST elevation (NSTEMI) myocardial infarction: Secondary | ICD-10-CM

## 2018-07-14 DIAGNOSIS — I472 Ventricular tachycardia: Secondary | ICD-10-CM

## 2018-07-14 MED ORDER — CLOPIDOGREL BISULFATE 75 MG PO TABS: 75.0000 mg | ORAL_TABLET | Freq: Every day | ORAL | 3 refills | Status: DC

## 2018-07-14 MED ORDER — METOPROLOL SUCCINATE ER 25 MG PO TB24: 25.0000 mg | ORAL_TABLET | Freq: Every day | ORAL | 3 refills | Status: DC

## 2018-07-14 MED ORDER — ATORVASTATIN CALCIUM 80 MG PO TABS: 80.0000 mg | ORAL_TABLET | Freq: Every day | ORAL | 3 refills | Status: DC

## 2018-07-14 NOTE — Progress Notes (Signed)
Virtual Visit via Video Note   This visit type was conducted due to national recommendations for restrictions regarding the COVID-19 Pandemic (e.g. social distancing) in an effort to limit this patient's exposure and mitigate transmission in our community.  Due to his co-morbid illnesses, this patient is at least at moderate risk for complications without adequate follow up.  This format is felt to be most appropriate for this patient at this time.  All issues noted in this document were discussed and addressed.  A limited physical exam was performed with this format.  Please refer to the patient's chart for his consent to telehealth for [facility].   Date:  07/14/2018   ID:  Nicholas Webb, DOB 04/18/1940, MRN [id number]  Patient Location: Home Provider Location: Home  PCP:  Maurice Small, MD  Cardiologist:  Candee Furbish, MD  Electrophysiologist:  None   Evaluation Performed:  Follow-Up Visit  Chief Complaint: Post hospital, non-ST elevation myocardial infarction, syncope, PE follow-up  History of Present Illness:    Nicholas Webb is a 78 y.o. male with coronary artery disease with recent stent placement on 06/24/2018 here for follow-up.   Mid RCA lesion is 20% stenosed.  Prox Cx to Mid Cx lesion is 20% stenosed.  Prox LAD lesion is 99% stenosed.  A drug-eluting stent was successfully placed using a STENT RESOLUTE ONYX 3.0X18.  Post intervention, there is a 0% residual stenosis.  Ost 1st Diag lesion is 80% stenosed.   1. Severe stenosis proximal to mid LAD 2. Successful PTCA/DES x 1 proximal LAD 3. Mild non-obstructive disease in the Circumflex and RCA  Recommendations: DAPT for ASA/Brilinta for one year. Continue high intensity statin. Add beta blocker as heart rate and BP tolerates.   He was admitted to the hospital on 06/23/2018 with chest pain over the past weeks when mowing grass.  Preceded by some dizziness lost consciousness.  CT of the head showed no  lesions in the brain but he was found to have some stones which were likely metastatic.  Treated originally with IV heparin.  Cardiac catheterization took place.  Treated LAD lesion.  He is up having a consult by Dr. Benay Spice with oncology.  CT of the chest on 4/25 revealed acute pulmonary emboli in the distal right main pulmonary artery and throughout the lung.  There was embolism in the left lower and upper pulmonary arteries.  No evidence of right heart strain.  He was placed on oral Eliquis asymptomatic.  Syncope was thought to be secondary to PE.  It looks like he was discharged on aspirin 81, Eliquis 5 mg twice a day and Brilinta 90 mg twice a day.  I have adjusted the medication see below.  It was placed he had 8 beat run of NSVT and rare PVC.  Asymptomatic.  Overall currently increasing his walking efforts, doing well, no major complaints, no further syncope no chest pain no significant shortness of breath, no significant leg swelling.  No bleeding episodes.  Triple therapy noted.  His daughter, is a Designer, jewellery at the [employer] office.  Extremely helpul on the phone.  Driving restrictions.   The patient does not have symptoms concerning for COVID-19 infection (fever, chills, cough, or new shortness of breath).    Past Medical History:  Diagnosis Date  . Anemia of chronic disease   . Atherosclerosis of both carotid arteries   . Barrett's esophagus   . Dizziness   . GERD (gastroesophageal reflux disease)   .  Hx of adenomatous colonic polyps   . Hyperlipidemia   . Macrocytosis without anemia   . Obesity   . OSA (obstructive sleep apnea)   . Rheumatoid arthritis(714.0)   . Syncope    Past Surgical History:  Procedure Laterality Date  . CORONARY STENT INTERVENTION N/A 06/24/2018   Procedure: CORONARY STENT INTERVENTION;  Surgeon: Burnell Blanks, MD;  Location: [place] [facility];  Service: Cardiovascular;  Laterality: N/A;  . JOINT REPLACEMENT  2004   left   . LEFT HEART CATH AND CORONARY ANGIOGRAPHY N/A 06/24/2018   Procedure: LEFT HEART CATH AND CORONARY ANGIOGRAPHY;  Surgeon: Burnell Blanks, MD;  Location: [place] [facility];  Service: Cardiovascular;  Laterality: N/A;     Current Meds  Medication Sig  . Adalimumab (HUMIRA) 40 MG/0.8ML PSKT Inject 1 each into the skin See admin instructions. Every 2 weeks  . apixaban (ELIQUIS) 5 MG TABS tablet Take 5 mg by mouth 2 (two) times daily.  Marland Kitchen aspirin EC 81 MG tablet Take 1 tablet (81 mg total) by mouth daily for 30 days.  Marland Kitchen atorvastatin (LIPITOR) 80 MG tablet Take 1 tablet (80 mg total) by mouth daily at 6 PM.  . esomeprazole (NEXIUM) 20 MG capsule Take 20 mg by mouth daily at 12 noon.   . folic acid (FOLVITE) 1 MG tablet Take 2 mg by mouth daily.   . methotrexate (RHEUMATREX) 2.5 MG tablet Take 30-37.5 mg by mouth See admin instructions. Take 37.5mg  on Sundays and 30mg  on Mondays  . naproxen sodium (ALEVE) 220 MG tablet Take 220 mg by mouth.  . [DISCONTINUED] atorvastatin (LIPITOR) 80 MG tablet Take 1 tablet (80 mg total) by mouth daily at 6 PM for 30 days.  . [DISCONTINUED] metoprolol tartrate (LOPRESSOR) 25 MG tablet Take 0.5 tablets (12.5 mg total) by mouth 2 (two) times daily for 30 days.  . [DISCONTINUED] ticagrelor (BRILINTA) 90 MG TABS tablet Take 1 tablet (90 mg total) by mouth 2 (two) times daily.     Allergies:   Patient has no known allergies.   Social History   Tobacco Use  . Smoking status: Never Smoker  . Smokeless tobacco: Never Used  Substance Use Topics  . Alcohol use: Yes  . Drug use: No     Family Hx: The patient's family history includes Cancer in his father; Colon cancer in his father; Glaucoma in his mother; Prostate cancer in his father; Stroke in his mother.  ROS:   Please see the history of present illness.    Denies bleeding fevers chills nausea vomiting syncope orthopnea All other systems reviewed and are negative.   Prior CV studies:   The  following studies were reviewed today:  Cardiac catheterization echocardiogram vascular ultrasound all reviewed.  CT scan reviewed.  See below for details  Labs/Other Tests and Data Reviewed:    EKG:  An ECG dated 06/25/18 was personally reviewed today and demonstrated:  SR, RBBB, PAC's  Recent Labs: 06/23/2018: B Natriuretic Peptide 314.2; TSH 2.365 06/24/2018: ALT 49; Magnesium 1.9 06/25/2018: BUN 14; Creatinine, Ser 0.85; Potassium 3.9; Sodium 139 06/28/2018: Hemoglobin 12.8; Platelets 181   Recent Lipid Panel Lab Results  Component Value Date/Time   CHOL 155 06/23/2018 07:33 PM   TRIG 36 06/23/2018 07:33 PM   HDL 45 06/23/2018 07:33 PM   CHOLHDL 3.4 06/23/2018 07:33 PM   LDLCALC 103 (H) 06/23/2018 07:33 PM    Wt Readings from Last 3 Encounters:  07/14/18 181 lb (82.1 kg)  06/28/18  178 lb (80.7 kg)  05/31/18 192 lb (87.1 kg)     Objective:    Vital Signs:  BP 114/68   Pulse 67   Ht 5\' 9"  (1.753 m)   Wt 181 lb (82.1 kg)   BMI 26.73 kg/m    VITAL SIGNS:  reviewed GEN:  no acute distress EYES:  sclerae anicteric, EOMI - Extraocular Movements Intact RESPIRATORY:  normal respiratory effort, symmetric expansion SKIN:  no rash, lesions or ulcers. MUSCULOSKELETAL:  no obvious deformities. NEURO:  alert and oriented x 3, no obvious focal deficit PSYCH:  normal affect  ASSESSMENT & PLAN:    Coronary artery status post recent stent placement to proximal LAD with DES on 06/24/2018 in setting of non-ST elevation myocardial infarction - Changes triple therapy over to aspirin 81, Eliquis 5 twice a day, Plavix 75 mg a day.  Stop Brilinta.  Gentle load with 150 of Plavix on day one.  On 07/24/2018, stop the aspirin but continue the Eliquis and Plavix. -He is doing well, gently increase exercise.  Bilateral PE, DVT -On Eliquis.  At least 6 months therapy.  Oncology following.  Wide-complex tachycardia -8 beats on monitor.  On beta-blocker.  Changing over to Toprol-XL 25 mg once a  day for ease of use.  Normal EF.  He is asymptomatic.  Syncope - Previously spoke with Dr. Curt Bears.  It is certainly plausible that the large burden PE was the cause for his syncope and that this was not arrhythmogenic.  Continuing driving restrictions.  Skull lesions - He is being presented to the tumor board.  Thankfully PET scan was reassuring.  Complex medical decision making, discussion  COVID-19 Education: The signs and symptoms of COVID-19 were discussed with the patient and how to seek care for testing (follow up with PCP or arrange E-visit).  The importance of social distancing was discussed today.  Time:   Today, I have spent 25 minutes with the patient with telehealth technology discussing the above problems.     Medication Adjustments/Labs and Tests Ordered: Current medicines are reviewed at length with the patient today.  Concerns regarding medicines are outlined above.   Tests Ordered: No orders of the defined types were placed in this encounter.   Medication Changes: Meds ordered this encounter  Medications  . atorvastatin (LIPITOR) 80 MG tablet    Sig: Take 1 tablet (80 mg total) by mouth daily at 6 PM.    Dispense:  90 tablet    Refill:  3  . clopidogrel (PLAVIX) 75 MG tablet    Sig: Take 1 tablet (75 mg total) by mouth daily. Take (2) tablets the first day then (1) tablet daily thereafter    Dispense:  90 tablet    Refill:  3  . metoprolol succinate (TOPROL-XL) 25 MG 24 hr tablet    Sig: Take 1 tablet (25 mg total) by mouth daily.    Dispense:  90 tablet    Refill:  3    Disposition:  Follow up in 2 month(s)  Signed, Candee Furbish, MD  07/14/2018 4:19 PM    [facility]

## 2018-07-14 NOTE — Patient Instructions (Addendum)
Medication Instructions:  Please stop your Brilinta.   Continue Aspirin and Eliquis.   On 07/24/2018 - stop the Aspirin but continue the Eliquis. Start Plavix 75 mg once daily but on the 1st day take 2 tablets for a dose of 150 mg.  Continue all other medications as listed.  If you need a refill on your cardiac medications before your next appointment, please call your pharmacy.   Follow-Up: Follow up with Dr Marlou Porch in 2 months as scheduled.  Thank you for choosing [facility]!!

## 2018-07-15 ENCOUNTER — Telehealth: Payer: Self-pay | Admitting: Cardiology

## 2018-07-15 NOTE — Telephone Encounter (Signed)
Patient calling to see if it is okay to wash dishes and put his hands in water. Asked patient about his cath site. Patient stated it was healing fine and had no issues. Patient had heart cath over 3 weeks ago. Informed patient it should be fine to wash dishes. Informed patient if he has any bleeding at the site, to hold pressure and seek medical attention if it does not stop bleeding. Patient verbalized understanding.

## 2018-07-15 NOTE — Telephone Encounter (Signed)
New message   Patient wants to know if he can put his hands in water. He has had a stent put in. Please advise.

## 2018-07-20 ENCOUNTER — Other Ambulatory Visit: Payer: Self-pay | Admitting: Radiation Therapy

## 2018-07-22 ENCOUNTER — Inpatient Hospital Stay (HOSPITAL_BASED_OUTPATIENT_CLINIC_OR_DEPARTMENT_OTHER): Payer: Medicare Other | Admitting: Oncology

## 2018-07-22 DIAGNOSIS — I2699 Other pulmonary embolism without acute cor pulmonale: Secondary | ICD-10-CM

## 2018-07-22 DIAGNOSIS — Z7982 Long term (current) use of aspirin: Secondary | ICD-10-CM | POA: Diagnosis not present

## 2018-07-22 DIAGNOSIS — Z7901 Long term (current) use of anticoagulants: Secondary | ICD-10-CM

## 2018-07-22 NOTE — Progress Notes (Signed)
[place] OFFICE VISIT PROGRESS NOTE  I connected with on 07/22/18 at  2:00 PM EDT by telephone and verified that I am speaking with the correct person using two identifiers.   Other persons participating in the visit and their role in the encounter: Wife  Patient's location: Home Provider's location: Office   Diagnosis: Skull mass  INTERVAL HISTORY:   This was a telephone visit with Mr. Divis.  He agreed to a telephone visit in lieu of an in person visit secondary to the [place] pandemic. Mr. Martindale reports feeling well.  He reports an occasional feeling of "lightheaded".  He relates this to taking Eliquis and metoprolol.  No bleeding.  No chest pain or dyspnea.  He is using topical Efudex on a parietal scalp lesion.  He is scheduled to see dermatology next week.  He continues Humira and methotrexate for rheumatoid arthritis. Good appetite.  No other complaint.  Objective:    Lab Results:  Lab Results  Component Value Date   WBC 10.5 06/28/2018   HGB 12.8 (L) 06/28/2018   HCT 38.4 (L) 06/28/2018   MCV 100.8 (H) 06/28/2018   PLT 181 06/28/2018   NEUTROABS 5.7 06/23/2018     Medications: I have reviewed the patient's current medications.  Assessment/Plan: 1.Skull mass, chest lymphadenopathy  MRI brain 06/24/2018-posterior vertex infiltrated and poorly marginated mass with extending into the underlying pachymeningeal's and into the scalp, the mass invades and narrows the superior sagittal sinus  CTs of the chest, abdomen, and pelvis on 06/25/2018-acute bilateral pulmonary emboli with a large clot burden, necrotic mediastinal adenopathy, 8 mm pleural-based left lower lobe nodule, 4 mm left upper lobe nodules, chronic pulmonary fibrosis  PET scan 07/06/2018- no evidence of a primary malignancy, small pulmonary nodules are not metabolically active, mildly enlarged/mildly metabolic mediastinal nodes favored to be reactive  2. 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 Tat had a recent myocardial infarction and pulmonary embolism.  He is maintained on aspirin, Plavix, and Eliquis.  He has a destructive skull mass that was not present on a CT from 2017.  There is no apparent primary tumor site on physical exam or imaging studies.  We will request results of a scalp biopsy from the dermatologist.  I could not palpate a scalp lesion when I saw him in the hospital. I presented his case at the GI tumor conference this week.  I referred him to the neurosurgical tumor conference on 07/25/2018 to discuss the indication for a skull biopsy.  He will be scheduled for an office visit here in approximately 2 weeks.  I recommend waiting on the skull/scalp biopsy until he is several more weeks out from the myocardial infarction/pulmonary embolism diagnosis.  He will need to be taken off of anticoagulation for a biopsy procedure.  Mr. Cerone will return for an office visit here in 2 weeks.  I discussed the assessment and treatment plan with the patient. The patient was provided an opportunity to ask questions and all were answered. The patient agreed with the plan  and demonstrated an understanding of the instructions.   The patient was advised to call back or seek an in-person evaluation if the symptoms worsen or if the condition fails to improve as anticipated.  I  provided 25 minutes of telephone and documentation time during this encounter, and > 50% was spent counseling as documented under my assessment & plan.  [name] ANP/GNP-BC   07/22/2018 2:18 PM

## 2018-07-26 ENCOUNTER — Telehealth: Payer: Self-pay | Admitting: Oncology

## 2018-07-26 NOTE — Telephone Encounter (Signed)
Scheduled appt per 5/22 los. ° °Patient aware of appt date and time. °

## 2018-07-28 ENCOUNTER — Telehealth: Payer: Self-pay | Admitting: *Deleted

## 2018-07-28 NOTE — Telephone Encounter (Signed)
Called to inquire about when his biopsy will occur. Explained that his case is being discussed on 08/01/18 at the neurosurgical tumor board and will have a preliminary decision at that time. If we move forward on his biopsy it will require holding his Eliquis a few days and a COVID negative test as well. He will call on Monday to f/u on decision.  Also asking if Dr. Benay Spice still wants to see him on 08/08/18 if biopsy has not been done yet?

## 2018-07-29 ENCOUNTER — Other Ambulatory Visit: Payer: Self-pay | Admitting: *Deleted

## 2018-07-29 MED ORDER — APIXABAN 5 MG PO TABS: 5.0000 mg | ORAL_TABLET | Freq: Two times a day (BID) | ORAL | 1 refills | Status: DC

## 2018-07-29 NOTE — Progress Notes (Signed)
Called to request refill on his Eliquis. MD told him he would re-order it for him at last telephone visit.

## 2018-08-04 ENCOUNTER — Telehealth (HOSPITAL_COMMUNITY): Payer: Self-pay | Admitting: *Deleted

## 2018-08-04 NOTE — Telephone Encounter (Signed)
Left message for pt regarding cardiac rehab continued closure for group exercise due to covid-19.  Requested call back from pt regarding virtual cardiac rehab that we can offer with no cost to him. Contact information left for pt. Cherre Huger, BSN Cardiac and Training and development officer

## 2018-08-04 NOTE — Telephone Encounter (Signed)
Pt returned call and is very interested in enrolling in virtual cardiac rehab.  Pt has smartphone, reliable internet and understands how apps work. Will also send to pt exercise handouts and heart healthy nutrition sheets to review during the interim.  Pt anticipates a call for scheduling a telephone visit.Cherre Huger, BSN Cardiac and Training and development officer

## 2018-08-05 ENCOUNTER — Telehealth (HOSPITAL_COMMUNITY): Payer: Self-pay | Admitting: *Deleted

## 2018-08-05 NOTE — Telephone Encounter (Signed)
Called and spoke to pt for consent:            Confirm Consent - In the setting of the current Covid19 crisis, you are scheduled for a phone visit with your Cardiac or Pulmonary team member.  Just as we do with many in-gym visits, in order for you to participate in this visit, we must obtain consent.  If you'd like, I can send this to your mychart (if signed up) or email for you to review.  Otherwise, I can obtain your verbal consent now.  By agreeing to a telephone visit, we'd like you to understand that the technology does not allow for your Cardiac or Pulmonary Rehab team member to perform a physical assessment, and thus may limit their ability to fully assess your ability to perform exercise programs. If your provider identifies any concerns that need to be evaluated in person, we will make arrangements to do so.  Finally, though the technology is pretty good, we cannot assure that it will always work on either your or our end and we cannot ensure that we have a secure connection.  Cardiac and Pulmonary Rehab Telehealth visits and "At Home" cardiac and pulmonary rehab are provided at no cost to you.               Are you willing to proceed?" STAFF: Did the patient verbally acknowledge consent to telehealth visit? Document YES/NO here: YES    Maurice Small RN, BSN Cardiac and Pulmonary Rehab Nurse Navigator     Cardiac and Pulmonary Rehab Staff   08/05/18 @ 1130

## 2018-08-05 NOTE — Telephone Encounter (Signed)
Following in  basket message sent to MD for notification to transition to virtual cardiac rehab:   Dr. Marlou Porch,   As you are aware our department remains closed to patients due to Covid-19.  We are excited to be able to offer an alternative to traditional onsite Cardiac Rehab while your patient continues to follow Re-Open guidelines.  This is a notification that your patient has been contacted and is very interested in participating in Virtual Cardiac Rehab.  Thank you for your continued support in helping [place] meet the health care needs of our patients.  Cherre Huger, BSN Cardiac and Air cabin crew

## 2018-08-08 ENCOUNTER — Encounter (HOSPITAL_COMMUNITY): Payer: Self-pay

## 2018-08-08 ENCOUNTER — Other Ambulatory Visit: Payer: Self-pay

## 2018-08-08 ENCOUNTER — Inpatient Hospital Stay: Payer: Medicare Other | Attending: Oncology | Admitting: Nurse Practitioner

## 2018-08-08 ENCOUNTER — Telehealth (HOSPITAL_COMMUNITY): Payer: Self-pay | Admitting: *Deleted

## 2018-08-08 VITALS — BP 105/67 | HR 89 | Temp 98.5°F | Resp 17 | Ht 69.0 in | Wt 185.7 lb

## 2018-08-08 DIAGNOSIS — Z86711 Personal history of pulmonary embolism: Secondary | ICD-10-CM | POA: Diagnosis not present

## 2018-08-08 DIAGNOSIS — G9389 Other specified disorders of brain: Secondary | ICD-10-CM | POA: Diagnosis not present

## 2018-08-08 DIAGNOSIS — M069 Rheumatoid arthritis, unspecified: Secondary | ICD-10-CM | POA: Diagnosis not present

## 2018-08-08 DIAGNOSIS — Z86718 Personal history of other venous thrombosis and embolism: Secondary | ICD-10-CM | POA: Insufficient documentation

## 2018-08-08 DIAGNOSIS — Z7901 Long term (current) use of anticoagulants: Secondary | ICD-10-CM | POA: Diagnosis not present

## 2018-08-08 DIAGNOSIS — J8410 Pulmonary fibrosis, unspecified: Secondary | ICD-10-CM | POA: Diagnosis not present

## 2018-08-08 NOTE — Progress Notes (Addendum)
  [place] OFFICE PROGRESS NOTE   Diagnosis: Skull mass  INTERVAL HISTORY:   Nicholas Webb returns as scheduled.  He reports feeling well.  No spontaneous bleeding.  He denies pain.  No fever, cough, shortness of breath.  Objective:  Vital signs in last 24 hours:  Blood pressure 105/67, pulse 89, temperature 98.5 F (36.9 C), temperature source Oral, resp. rate 17, height 5\' 9"  (1.753 m), weight 185 lb 11.2 oz (84.2 kg), SpO2 98 %.    Lymphatics: No palpable cervical, supraclavicular or axillary lymph nodes. Vascular: No leg edema. Skin: Multiple lesions that appear to be seborrheic keratoses at the upper mid parietal region.  No mass.  Lab Results:  Lab Results  Component Value Date   WBC 10.5 06/28/2018   HGB 12.8 (L) 06/28/2018   HCT 38.4 (L) 06/28/2018   MCV 100.8 (H) 06/28/2018   PLT 181 06/28/2018   NEUTROABS 5.7 06/23/2018    Imaging:  No results found.  Medications: I have reviewed the patient's current medications.  Assessment/Plan: 1.Skull mass, chest lymphadenopathy  MRI brain 06/24/2018-posterior vertex infiltrated and poorly marginated mass with extending into the underlying pachymeningeal's and into the scalp, the mass invades and narrows the superior sagittal sinus  CTs of the chest, abdomen, and pelvis on 06/25/2018-acute bilateral pulmonary emboli with a large clot burden, necrotic mediastinal adenopathy, 8 mm pleural-based left lower lobe nodule, 4 mm left upper lobe nodules, chronic pulmonary fibrosis  PET scan 07/06/2018- no evidence of a primary malignancy, small pulmonary nodules are not metabolically active, mildly enlarged/mildly metabolic mediastinal nodes favored to be reactive 2. 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  He has been referred to Dr. Ronnald Ramp regarding biopsy of the skull mass.  He has an initial appointment with Dr. Ronnald Ramp tomorrow.  We will make Dr. Ronnald Ramp office aware he is on Eliquis and Plavix.  Anticipate he will have the biopsy in the next week or so.  He will return for a follow-up visit here in approximately 2 weeks.  He will contact the office in the interim with any problems.  Patient seen with Dr. Benay Spice.    Ned Card ANP/GNP-BC   08/08/2018  11:27 AM  This was a shared visit with Ned Card.  Nicholas Webb was interviewed and examined.  I discussed the case with neurooncology last week.  The plan is to proceed with surgical biopsy of the destructive skull lesion.  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  I cannot palpate a scalp or skull mass on exam today.  I think it is unlikely that a skin primary accounts for the CT/MRI findings.  He will need to hold Eliquis anticoagulation for the biopsy procedure.  Cardiology can be consulted regarding interruption of Plavix.  We will see Nicholas Webb after the biopsy.  Julieanne Manson, MD

## 2018-08-08 NOTE — Telephone Encounter (Signed)
Spoke with Mr Rappaport today is his birthday! Will get clearance from oncology before proceeding with virtual cardiac rehab. Patient is agreeable with the plan and appreciative of the call.Barnet Pall, RN,BSN 08/08/2018 2:58 PM

## 2018-08-09 ENCOUNTER — Telehealth: Payer: Self-pay | Admitting: Oncology

## 2018-08-09 ENCOUNTER — Telehealth (HOSPITAL_COMMUNITY): Payer: Self-pay | Admitting: *Deleted

## 2018-08-09 NOTE — Telephone Encounter (Signed)
-----   Message from Jerline Pain, MD sent at 08/09/2018 10:36 AM EDT ----- Regarding: RE: virtual cardiac rehab [facility] with me. Thanks Candee Furbish, MD ----- Message ----- From: Magda Kiel, RN Sent: 08/09/2018   9:09 AM EDT To: Jerline Pain, MD Subject: virtual cardiac rehab                          Good morning Dr Marlou Porch,  Mr  Fleischer saw Ned Card ANP/ Dr Benay Spice yesterday and is interested in participating in virtual cardiac rehab. I asked if it was okay for Mr Valletta to proceed with virtual cardiac rehab. Dr Benay Spice told me to consult with you about him proceeding.    Thanks for your input,  Sincerely,  Barnet Pall RN Cardiac rehab.

## 2018-08-09 NOTE — Telephone Encounter (Signed)
Spoke with patient. Received the okay from Dr Marlou Porch for the patient to proceed with virtual cardiac rehab. Will contact the patient tomorrow to complete the process.Pt is interested in participating in Virtual Cardiac Rehab. Pt advised that Virtual Cardiac Rehab is provided at no cost to the patient.  Checklist:  1. Pt has smart device  ie smartphone and/or ipad for downloading an app  Yes 2. Reliable internet/wifi service    Yes 3. Understands how to use their smartphone and navigate within an app.  Yes 4.  Reviewed with pt the scheduling process for virtual cardiac rehab.  Pt verbalized understanding.Barnet Pall, RN,BSN 08/09/2018 11:10 AM

## 2018-08-09 NOTE — Telephone Encounter (Signed)
Scheduled appt per 6/8 los. Spoke with patient and he is aware of his appt date and time

## 2018-08-10 ENCOUNTER — Telehealth (HOSPITAL_COMMUNITY): Payer: Self-pay | Admitting: *Deleted

## 2018-08-10 ENCOUNTER — Encounter (HOSPITAL_COMMUNITY)
Admission: RE | Admit: 2018-08-10 | Discharge: 2018-08-10 | Disposition: A | Discharge status: Home / Self Care | Payer: Self-pay | Source: Ambulatory Visit | Attending: Cardiology | Admitting: Cardiology

## 2018-08-10 ENCOUNTER — Other Ambulatory Visit: Payer: Self-pay

## 2018-08-10 ENCOUNTER — Telehealth: Payer: Self-pay | Admitting: *Deleted

## 2018-08-10 NOTE — Telephone Encounter (Signed)
  Very difficult situation. Needs w/u for brain mass but s/p recent prox LAD DES placement 06/2018, on Plavix, as well as recent diagnosis of bilateral PE 06/2018, being treated w/ Eliquis. Will route to primary MD for recommendations.

## 2018-08-10 NOTE — Telephone Encounter (Signed)
   [facility] Pre-operative Risk Assessment    Request for surgical clearance:  1. What type of surgery is being performed? OPEN BIOPSY OF SKULL MASS   2. When is this surgery scheduled? URGENT TBD   3. What type of clearance is required (medical clearance vs. Pharmacy clearance to hold med vs. Both)? BOTH  4. Are there any medications that need to be held prior to surgery and how long? ELIQUIS   5. Practice name and name of physician performing surgery? [facility]; DR. Eustace Moore   6. What is your office phone number [phone]    7.   What is your office fax number [phone]  8.   Anesthesia type (None, local, MAC, general) ? GENERAL   Nicholas Webb 08/10/2018, 10:35 AM  _________________________________________________________________   (provider comments below)

## 2018-08-10 NOTE — Telephone Encounter (Signed)
Called and spoke to pt regarding Virtual Cardiac Rehab.  Pt  was able to download the [facility] app on their smart device with no issues. Pt set up their account and received the following welcome message -"Welcome to the [facility] program. We hope that you will find the exercise program beneficial in your recovery process. Our staff is available to assist with any questions/concerns about your exercise routine. Best wishes". Brief orientation provided to with the advisement to watch the "Intro to Rehab" series located under the Resource tab. Pt verbalized understanding. Will continue to follow and monitor pt progress with feedback as needed.Barnet Pall, RN,BSN 08/10/2018 3:14 PM

## 2018-08-10 NOTE — Telephone Encounter (Signed)
ADDENDUM: PT IS ALSO ON PLAVIX

## 2018-08-10 NOTE — Telephone Encounter (Signed)
Patient with recent diagnosis of bilateral PE 06/2018- on Eliquis. Not ideal to hold anticoagulation in the first 6 months, however patient is in need of urgent biopsy of brain mass. Normally would recommend to hold anticoagulation 2 days prior to procedure due to bleed risk. However patient is also at risk off of anticoagulation with an active clot. Could consider 1 day hold if ok with surgeon. CrCl is 20ml/min, therefore patient is clearing drug effectively. I will route to Dr. Marlou Porch for input.

## 2018-08-10 NOTE — Telephone Encounter (Signed)
Agree. Challenging. Will call Dr. Sherley Bounds to discuss.  Candee Furbish, MD

## 2018-08-11 ENCOUNTER — Telehealth (HOSPITAL_COMMUNITY): Payer: Self-pay

## 2018-08-11 NOTE — Telephone Encounter (Signed)
Is there a possibility that we could wait until end of July (3 months post coronary stent and 3 months of Eliquis tx for PE?   Candee Furbish, MD

## 2018-08-11 NOTE — Telephone Encounter (Signed)
Called pt to discuss heart healthy nutrition while participating in [place] did not answer. Left message with contact information requesting pt call RD back. Will call pt again.  Laurina Bustle MS RD LDN 08/11/18       9:49am

## 2018-08-12 NOTE — Telephone Encounter (Signed)
Highest risk for recurrent venous thromboembolism is in the first 30-60 days.  Probably okay to hold anticoagulation for a few days when he is 2 months out from the DVT/PE.  However, I am okay with waiting if he cannot be off of antiplatelet therapy with the coronary stent  Thanks,  Julieanne Manson

## 2018-08-16 NOTE — Telephone Encounter (Signed)
Nicholas Webb,   I would like to wait until end of July, 3 months post drug eluting stent to proximal LAD to help minimize risk of stent thrombosis.   Thanks for your understanding.   [employer]

## 2018-08-23 NOTE — Telephone Encounter (Signed)
Pt returned my call and he has been made aware of Dr.Skains recommendations re: holding his surgery until the end of July. Pt states that he thought Dr. Ronnald Ramp office already put it on hold anyway, but he will discuss with them.  Pt thanked me for informing him.

## 2018-08-23 NOTE — Telephone Encounter (Signed)
   Primary Cardiologist: Candee Furbish, MD  Chart reviewed as part of pre-operative protocol coverage. See exchange below. Dr. Marlou Porch has discussed timing of surgery with oncology team,  expressing preference to wait until end of July because if he comes off blood thinners now there is risk of clotting his heart stent. Pt actually has appt in mid July for follow-up with Dr Marlou Porch virtually. This can be formally discussed in detail at that time. I don't see any notes specifically to the patient letting him know this conversation was occurring. Callback staff, please let patient know information above. Will remove from pre-op box.  Nicholas Pitter, PA-C 08/23/2018, 8:26 AM

## 2018-08-23 NOTE — Telephone Encounter (Signed)
Call placed to pt re: surgical clearance.  Left a message for pt to call back.

## 2018-08-24 ENCOUNTER — Encounter: Payer: Self-pay | Admitting: Cardiology

## 2018-08-26 ENCOUNTER — Other Ambulatory Visit: Payer: Self-pay

## 2018-08-26 ENCOUNTER — Telehealth: Payer: Self-pay | Admitting: Oncology

## 2018-08-26 ENCOUNTER — Inpatient Hospital Stay: Payer: Medicare Other | Admitting: Oncology

## 2018-08-26 VITALS — BP 94/50 | HR 59 | Temp 98.4°F | Resp 18 | Ht 69.0 in | Wt 186.7 lb

## 2018-08-26 DIAGNOSIS — G9389 Other specified disorders of brain: Secondary | ICD-10-CM | POA: Diagnosis not present

## 2018-08-26 DIAGNOSIS — M069 Rheumatoid arthritis, unspecified: Secondary | ICD-10-CM | POA: Diagnosis not present

## 2018-08-26 DIAGNOSIS — Z86711 Personal history of pulmonary embolism: Secondary | ICD-10-CM

## 2018-08-26 DIAGNOSIS — Z86718 Personal history of other venous thrombosis and embolism: Secondary | ICD-10-CM

## 2018-08-26 DIAGNOSIS — Z7901 Long term (current) use of anticoagulants: Secondary | ICD-10-CM

## 2018-08-26 NOTE — Progress Notes (Signed)
  [facility] OFFICE PROGRESS NOTE   Diagnosis: DVT/pulmonary embolism, skull mass  INTERVAL HISTORY:   [name] returns for a scheduled visit.  He reports feeling well.  Good appetite.  No bleeding.  No complaint.  He has not undergone biopsy of the skull mass.  He continues Humira and methotrexate for treatment of rheumatoid arthritis.  Objective:  Vital signs in last 24 hours:  Blood pressure (!) 94/50, pulse (!) 59, temperature 98.4 F (36.9 C), temperature source Oral, resp. rate 18, height 5\' 9"  (1.753 m), weight 186 lb 11.2 oz (84.7 kg), SpO2 96 %.   Limited physical examination secondary to distancing with the coded pandemic Lymphatics: No cervical, supraclavicular, axillary, or inguinal nodes GI: Nontender, no hepatosplenomegaly, no mass Vascular: No leg edema  Lab Results:  Lab Results  Component Value Date   WBC 10.5 06/28/2018   HGB 12.8 (L) 06/28/2018   HCT 38.4 (L) 06/28/2018   MCV 100.8 (H) 06/28/2018   PLT 181 06/28/2018   NEUTROABS 5.7 06/23/2018    CMP  Lab Results  Component Value Date   NA 139 06/25/2018   K 3.9 06/25/2018   CL 107 06/25/2018   CO2 24 06/25/2018   GLUCOSE 106 (H) 06/25/2018   BUN 14 06/25/2018   CREATININE 0.85 06/25/2018   CALCIUM 8.9 06/25/2018   PROT 6.4 (L) 06/24/2018   ALBUMIN 3.2 (L) 06/24/2018   AST 40 06/24/2018   ALT 49 (H) 06/24/2018   ALKPHOS 67 06/24/2018   BILITOT 0.8 06/24/2018   GFRNONAA >60 06/25/2018   GFRAA >60 06/25/2018    Lab Results  Component Value Date   CEA1 1.4 06/27/2018    Medications: I have reviewed the patient's current medications.   Assessment/Plan:  1. Skull mass, chest lymphadenopathy  MRI brain 06/24/2018-posterior vertex infiltrated and poorly marginated mass with extending into the underlying pachymeningeal's and into the scalp, the mass invades and narrows the superior sagittal sinus  CTs of the chest, abdomen, and pelvis on 06/25/2018-acute bilateral  pulmonary emboli with a large clot burden, necrotic mediastinal adenopathy, 8 mm pleural-based left lower lobe nodule, 4 mm left upper lobe nodules, chronic pulmonary fibrosis  PET scan 07/06/2018- no evidence of a primary malignancy, small pulmonary nodules are not metabolically active, mildly enlarged/mildly metabolic mediastinal nodes favored to be reactive 2. 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  The skull mass biopsy has been delayed secondary to the need to come off of anticoagulation therapy.  He will be 3 months out from the stent placement on 09/16/2018.  I discussed the case with Dr. Ronnald Ramp earlier today.  I will request Dr. Ronnald Ramp schedule a biopsy for the week of 09/19/2018.  He can hold the apixaban for surgery.  He scheduled see Dr. Marlou Porch on 09/15/2018.  He will return for an office visit here on 09/26/2018.  His daughter admitted multiple questions today.  I answered these.     [name], MD  08/26/2018  9:35 AM

## 2018-08-26 NOTE — Telephone Encounter (Signed)
Scheduled appt per 6/26 los - gave patient AVS and calender per los.  

## 2018-09-14 ENCOUNTER — Telehealth: Payer: Self-pay

## 2018-09-14 ENCOUNTER — Other Ambulatory Visit: Payer: Self-pay | Admitting: Oncology

## 2018-09-14 NOTE — Telephone Encounter (Signed)

## 2018-09-15 ENCOUNTER — Other Ambulatory Visit: Payer: Self-pay

## 2018-09-15 ENCOUNTER — Encounter: Payer: Self-pay | Admitting: Cardiology

## 2018-09-15 ENCOUNTER — Telehealth (INDEPENDENT_AMBULATORY_CARE_PROVIDER_SITE_OTHER): Payer: Medicare Other | Admitting: Cardiology

## 2018-09-15 VITALS — BP 112/69 | HR 60 | Ht 69.0 in | Wt 181.0 lb

## 2018-09-15 DIAGNOSIS — I2699 Other pulmonary embolism without acute cor pulmonale: Secondary | ICD-10-CM | POA: Diagnosis not present

## 2018-09-15 DIAGNOSIS — R55 Syncope and collapse: Secondary | ICD-10-CM

## 2018-09-15 DIAGNOSIS — I2510 Atherosclerotic heart disease of native coronary artery without angina pectoris: Secondary | ICD-10-CM

## 2018-09-15 MED ORDER — METOPROLOL SUCCINATE 25 MG PO CS24: 12.5000 mg | EXTENDED_RELEASE_CAPSULE | Freq: Every day | ORAL | 3 refills | Status: DC

## 2018-09-15 NOTE — Progress Notes (Signed)
Virtual Visit via Video Note   This visit type was conducted due to national recommendations for restrictions regarding the COVID-19 Pandemic (e.g. social distancing) in an effort to limit this patient's exposure and mitigate transmission in our community.  Due to his co-morbid illnesses, this patient is at least at moderate risk for complications without adequate follow up.  This format is felt to be most appropriate for this patient at this time.  All issues noted in this document were discussed and addressed.  A limited physical exam was performed with this format.  Please refer to the patient's chart for his consent to telehealth for [facility]   Date:  09/15/2018   ID:  Nicholas Webb, DOB [birth year], MRN [id number]  Patient Location: Home Provider Location: Home  PCP:  Maurice Small, MD  Cardiologist:  Candee Furbish, MD  Electrophysiologist:  None   Evaluation Performed:  Follow-Up Visit  Chief Complaint: Follow-up CAD A. fib  History of Present Illness:    Nicholas Webb is a 78 y.o. male with stent placement on 06/24/2018 and PE here for follow-up.  Skull mass awaiting biopsy.  Discussed with oncology team as well as neurosurgery.  LAD lesion treated with PCI.  After PCI placement, a PET CT was performed which showed acute pulmonary emboli in the distal right main pulmonary artery and throughout the lung.  Left lower lobe as well as upper pulmonary arteries as well.  No heart strain.  He was placed on Eliquis as well as aspirin and Brilinta originally.  This was transitioned to Eliquis and Plavix.  He has been doing quite well, no chest pain fevers chills nausea vomiting syncope.  His daughter is a Designer, jewellery at the [place] office.  She was extremely helpful on the phone.    The patient does not have symptoms concerning for COVID-19 infection (fever, chills, cough, or new shortness of breath).    Past Medical History:  Diagnosis Date   Anemia of chronic  disease    Atherosclerosis of both carotid arteries    Barrett's esophagus    Dizziness    GERD (gastroesophageal reflux disease)    Hx of adenomatous colonic polyps    Hyperlipidemia    Macrocytosis without anemia    Obesity    OSA (obstructive sleep apnea)    Rheumatoid arthritis(714.0)    Syncope    Past Surgical History:  Procedure Laterality Date   CORONARY STENT INTERVENTION N/A 06/24/2018   Procedure: CORONARY STENT INTERVENTION;  Surgeon: Burnell Blanks, MD;  Location: [facility];  Service: Cardiovascular;  Laterality: N/A;   JOINT REPLACEMENT  2004   left   LEFT HEART CATH AND CORONARY ANGIOGRAPHY N/A 06/24/2018   Procedure: LEFT HEART CATH AND CORONARY ANGIOGRAPHY;  Surgeon: Burnell Blanks, MD;  Location: [facility];  Service: Cardiovascular;  Laterality: N/A;     Current Meds  Medication Sig   atorvastatin (LIPITOR) 80 MG tablet Take 1 tablet (80 mg total) by mouth daily at 6 PM.   clopidogrel (PLAVIX) 75 MG tablet Take 1 tablet (75 mg total) by mouth daily. Take (2) tablets the first day then (1) tablet daily thereafter   ELIQUIS 5 MG TABS tablet TAKE 1 TABLET(5 MG) BY MOUTH TWICE DAILY   esomeprazole (NEXIUM) 20 MG capsule Take 20 mg by mouth daily at 12 noon.    folic acid (FOLVITE) 1 MG tablet Take 2 mg by mouth daily.    methotrexate (RHEUMATREX) 2.5 MG  tablet Take 30-37.5 mg by mouth See admin instructions. Take 37.5mg  on Sundays and 30mg  on Mondays   [DISCONTINUED] metoprolol succinate (TOPROL-XL) 25 MG 24 hr tablet Take 1 tablet (25 mg total) by mouth daily.     Allergies:   Patient has no known allergies.   Social History   Tobacco Use   Smoking status: Never Smoker   Smokeless tobacco: Never Used  Substance Use Topics   Alcohol use: Yes   Drug use: No     Family Hx: The patient's family history includes Cancer in his father; Colon cancer in his father; Glaucoma in his mother; Prostate cancer  in his father; Stroke in his mother.  ROS:   Please see the history of present illness.    No fevers chills nausea vomiting syncope. All other systems reviewed and are negative.   Prior CV studies:   The following studies were reviewed today:  Prior catheterization echocardiogram reviewed  Labs/Other Tests and Data Reviewed:    EKG:  No new EKG  Recent Labs: 06/23/2018: B Natriuretic Peptide 314.2; TSH 2.365 06/24/2018: ALT 49; Magnesium 1.9 06/25/2018: BUN 14; Creatinine, Ser 0.85; Potassium 3.9; Sodium 139 06/28/2018: Hemoglobin 12.8; Platelets 181   Recent Lipid Panel Lab Results  Component Value Date/Time   CHOL 155 06/23/2018 07:33 PM   TRIG 36 06/23/2018 07:33 PM   HDL 45 06/23/2018 07:33 PM   CHOLHDL 3.4 06/23/2018 07:33 PM   LDLCALC 103 (H) 06/23/2018 07:33 PM    Wt Readings from Last 3 Encounters:  09/15/18 181 lb (82.1 kg)  08/26/18 186 lb 11.2 oz (84.7 kg)  08/08/18 185 lb 11.2 oz (84.2 kg)     Objective:    Vital Signs:  BP 112/69    Pulse 60    Ht 5\' 9"  (1.753 m)    Wt 181 lb (82.1 kg)    BMI 26.73 kg/m    VITAL SIGNS:  reviewed GEN:  no acute distress EYES:  sclerae anicteric, EOMI - Extraocular Movements Intact RESPIRATORY:  normal respiratory effort, symmetric expansion SKIN:  no rash, lesions or ulcers. MUSCULOSKELETAL:  no obvious deformities. NEURO:  alert and oriented x 3, no obvious focal deficit PSYCH:  normal affect  ASSESSMENT & PLAN:    Coronary artery status post recent stent placement to proximal LAD with DES on 06/24/2018 in setting of non-ST elevation myocardial infarction -  Currently stable from a symptom perspective.  On both Plavix and Eliquis.  Had a lengthy discussion with interventional cardiology colleagues as well as oncology and neurosurgery.  Given the newer generation drug-eluting stent and trial data supporting abbreviated dual antiplatelet therapy, and his pressing need for skull biopsy, I think it makes sense for [place] to  complete a 46-month course of dual antiplatelet/dual therapy with Plavix and Eliquis.  Also has bilateral PEs would be more stable at this time.  Long and thoughtful discussions took place about this. -He may proceed now with skull biopsy from a cardiac perspective.  Hold Plavix for 5 days and Eliquis for 3 days.  Resume both when able.  Bilateral PE, DVT -On Eliquis.  At least 6 months total therapy.  Oncology following.  He is going to have to hold his Eliquis for 3 days prior to biopsy.  Hold Plavix for 5 days prior to biopsy.  Wide-complex tachycardia -8 beats on monitor.  On beta-blocker.  Toprol-XL 12.5 mg once a day for ease of use.  Normal EF.  He is asymptomatic.  BP was a  little bit soft.  Syncope - Previously spoke with Dr. Curt Bears.  It is certainly plausible that the large burden PE was the cause for his syncope and that this was not arrhythmogenic.  Continuing driving restrictions 6 months  Skull lesions -  Awaiting biopsy.   COVID-19 Education: The signs and symptoms of COVID-19 were discussed with the patient and how to seek care for testing (follow up with PCP or arrange E-visit).  The importance of social distancing was discussed today.  Time:   Today, I have spent 15 minutes with the patient with telehealth technology discussing the above problems.     Medication Adjustments/Labs and Tests Ordered: Current medicines are reviewed at length with the patient today.  Concerns regarding medicines are outlined above.   Tests Ordered: No orders of the defined types were placed in this encounter.   Medication Changes: Meds ordered this encounter  Medications   Metoprolol Succinate 25 MG CS24    Sig: Take 12.5 mg by mouth daily.    Dispense:  45 capsule    Refill:  3    Follow Up:  Virtual Visit or In Person in 3 month(s)  Signed, Candee Furbish, MD  09/15/2018 2:19 PM    [facility]

## 2018-09-15 NOTE — Patient Instructions (Signed)
Medication Instructions:  Please decrease Metoprolol to 12.5 mg a day. Continue all other medications as listed.  If you need a refill on your cardiac medications before your next appointment, please call your pharmacy.   Follow-Up: Follow up in 3 months with Dr Marlou Porch.  Thank you for choosing [facility]!!

## 2018-09-16 NOTE — Telephone Encounter (Signed)
error 

## 2018-09-22 ENCOUNTER — Telehealth: Payer: Self-pay | Admitting: *Deleted

## 2018-09-22 NOTE — Telephone Encounter (Signed)
   Primary Cardiologist: Candee Furbish, MD  Chart reviewed as part of pre-operative protocol coverage. Patient was contacted 09/22/2018 in reference to pre-operative risk assessment for pending surgery as outlined below.  Nicholas Webb was last seen on 09/15/2018 by Dr. Marlou Porch.   JEYDEN COFFELT was doing well from a cardiac perspective.  Per chart review, Dr. Marlou Porch had a lengthy discussion with interventional cardiology team as well as oncology and neurosurgery.  Given the newer generation drug-eluting stents and trial data supporting abbreviated DAPT and pressing need for skull biopsy, plan was to complete full 28-month course of DAPT with Plavix and Eliquis till the end of July then may proceed with skull biopsy from a cardiac perspective.  Per Dr. Marlou Porch recommendations, may hold Plavix for 5 days and Eliquis for 3 days prior to procedure then resume both per surgical team once able.   Therefore, based on ACC/AHA guidelines, the patient would be at acceptable risk for the planned procedure without further cardiovascular testing.   I will route this recommendation to the requesting party via Epic fax function and remove from pre-op pool.  Please call with questions.  Kathyrn Drown, NP 09/22/2018, 12:09 PM

## 2018-09-22 NOTE — Telephone Encounter (Signed)
   [facility] Pre-operative Risk Assessment    Request for surgical clearance:  1. What type of surgery is being performed? OPEN BX OF SKULL MASS   2. When is this surgery scheduled? TBD  3. What type of clearance is required (medical clearance vs. Pharmacy clearance to hold med vs. Both)? BOTH  4. Are there any medications that need to be held prior to surgery and how long? ELIQUIS AND PLAVIX  - 5. Practice name and name of physician performing surgery? [facility]; DR. Sherley Bounds  6. What is your office phone number [phone]   7.   What is your office fax number [phone]  8.   Anesthesia type (None, local, MAC, general) ? GENERAL   Julaine Hua 09/22/2018, 12:02 PM  _________________________________________________________________   (provider comments below)  --

## 2018-09-23 ENCOUNTER — Other Ambulatory Visit (HOSPITAL_COMMUNITY): Payer: Self-pay | Admitting: Neurological Surgery

## 2018-09-23 ENCOUNTER — Other Ambulatory Visit: Payer: Self-pay | Admitting: Neurological Surgery

## 2018-09-23 DIAGNOSIS — Z86718 Personal history of other venous thrombosis and embolism: Secondary | ICD-10-CM

## 2018-09-23 DIAGNOSIS — M898X8 Other specified disorders of bone, other site: Secondary | ICD-10-CM

## 2018-09-26 ENCOUNTER — Telehealth: Payer: Self-pay | Admitting: Cardiology

## 2018-09-26 ENCOUNTER — Ambulatory Visit: Payer: Medicare Other | Admitting: Oncology

## 2018-09-26 NOTE — Telephone Encounter (Signed)
I called Dental office and s/w Bethena Roys. I asked Bethena Roys to please fax over a clearance form to [phone] or [phone], stating type of anesthesia, meds to be held, type of procedure. Bethena Roys thanked me for the call and will have a form faxed over today.

## 2018-09-26 NOTE — Telephone Encounter (Signed)
New Message     1. What dental office are you calling from? Dr Ursula Beath   2. What is your office phone number? [phone]  3. What is your fax number? [phone]  4. What type of procedure is the patient having performed? Fillings    5. What date is procedure scheduled or is the patient there now? September  (if the patient is at the dentist's office question goes to their cardiologist if he/she is in the office.  If not, question should go to the DOD).   6. What is your question (ex. Antibiotics prior to procedure, holding medication-we need to know how long dentist wants pt to hold med)? Pre med Amoxicillin and they believe he is on Blood thinners and needs to know if he needs to hold

## 2018-09-27 ENCOUNTER — Encounter: Payer: Self-pay | Admitting: *Deleted

## 2018-09-27 ENCOUNTER — Other Ambulatory Visit (HOSPITAL_COMMUNITY): Payer: Self-pay | Admitting: Interventional Radiology

## 2018-09-27 ENCOUNTER — Ambulatory Visit
Admission: RE | Admit: 2018-09-27 | Discharge: 2018-09-27 | Disposition: A | Discharge status: Home / Self Care | Payer: Medicare Other | Source: Ambulatory Visit | Attending: Neurological Surgery | Admitting: Neurological Surgery

## 2018-09-27 ENCOUNTER — Other Ambulatory Visit: Payer: Self-pay

## 2018-09-27 DIAGNOSIS — Z86718 Personal history of other venous thrombosis and embolism: Secondary | ICD-10-CM

## 2018-09-27 HISTORY — PX: IR RADIOLOGIST EVAL & MGMT: IMG5224

## 2018-09-27 NOTE — Telephone Encounter (Signed)
   [facility] Pre-operative Risk Assessment    Request for surgical clearance:  1. What type of surgery is being performed? Dental Fillings   2. When is this surgery scheduled? TBD   3. What type of clearance is required (medical clearance vs. Pharmacy clearance to hold med vs. Both)? Pharmacy  4. Are there any medications that need to be held prior to surgery and how long? Asking to hold Eliquis and does he need a premed?  5. Practice name and name of physician performing surgery?  Dr. Doristine Church DDS   6. What is your office phone number [phone]    7.   What is your office fax number [phone]  8.   Anesthesia type (None, local, MAC, general) ? Local   Nicholas Webb 09/27/2018, 3:48 PM  _________________________________________________________________   (provider comments below)

## 2018-09-27 NOTE — Consult Note (Signed)
Chief Complaint: Patient was consulted remotely today (TeleHealth) for history of PE at the request of Nicholas Webb.    Referring Physician(Webb): Nicholas Webb  History of Present Illness: Nicholas Webb is a 78 y.o. male Who presents at the kind request of Dr. Ronnald Ramp to evaluate for prophylactic IVC filter placement.  This gentleman has an infiltrative skull mass as well as necrotic lymphadenopathy in the mediastinum.  Overall, the picture is very concerning for metastatic disease of uncertain etiology.  He is set to undergo a calvarial biopsy to be performed by Dr. Ronnald Ramp on August 19.  Unfortunately, he is currently anticoagulated on apixaban following diagnosis of acute bilateral pulmonary emboli on 06/25/2018.  Additionally, he is on Plavix following placement of a drug-eluting stent in the left anterior descending coronary artery on 06/24/2018.  Nicholas Webb reports that he is in his usual state of health.  He denies chest pain, shortness of breath, lower extremity edema or other systemic symptoms at this time.  Past Medical History:  Diagnosis Date  . Anemia of chronic disease   . Atherosclerosis of both carotid arteries   . Barrett'Webb esophagus   . Dizziness   . GERD (gastroesophageal reflux disease)   . Hx of adenomatous colonic polyps   . Hyperlipidemia   . Macrocytosis without anemia   . Obesity   . OSA (obstructive sleep apnea)   . Rheumatoid arthritis(714.0)   . Syncope     Past Surgical History:  Procedure Laterality Date  . CORONARY STENT INTERVENTION N/A 06/24/2018   Procedure: CORONARY STENT INTERVENTION;  Surgeon: Burnell Blanks, MD;  Location: [facility];  Service: Cardiovascular;  Laterality: N/A;  . JOINT REPLACEMENT  2004   left  . LEFT HEART CATH AND CORONARY ANGIOGRAPHY N/A 06/24/2018   Procedure: LEFT HEART CATH AND CORONARY ANGIOGRAPHY;  Surgeon: Burnell Blanks, MD;  Location: [facility];  Service: Cardiovascular;   Laterality: N/A;    Allergies: Patient has no known allergies.  Medications: Prior to Admission medications   Medication Sig Start Date End Date Taking? Authorizing Provider  Adalimumab (HUMIRA) 40 MG/0.8ML PSKT Inject 1 each into the skin See admin instructions. Every 2 weeks    [provider]  atorvastatin (LIPITOR) 80 MG tablet Take 1 tablet (80 mg total) by mouth daily at 6 PM. 07/14/18   Jerline Pain, MD  clopidogrel (PLAVIX) 75 MG tablet Take 1 tablet (75 mg total) by mouth daily. Take (2) tablets the first day then (1) tablet daily thereafter 07/14/18   Jerline Pain, MD  ELIQUIS 5 MG TABS tablet TAKE 1 TABLET(5 MG) BY MOUTH TWICE DAILY 09/14/18   Ladell Pier, MD  esomeprazole (NEXIUM) 20 MG capsule Take 20 mg by mouth daily at 12 noon.     [provider]  folic acid (FOLVITE) 1 MG tablet Take 2 mg by mouth daily.     [provider]  methotrexate (RHEUMATREX) 2.5 MG tablet Take 30-37.5 mg by mouth See admin instructions. Take 37.5mg  on Sundays and 30mg  on Mondays    [provider]  Metoprolol Succinate 25 MG CS24 Take 12.5 mg by mouth daily. 09/15/18   Jerline Pain, MD     Family History  Problem Relation Age of Onset  . Stroke Mother   . Glaucoma Mother   . Cancer Father   . Prostate cancer Father   . Colon cancer Father     Social History  Socioeconomic History  . Marital status: Married    Spouse name: Katharine Look  . Number of children: 2  . Years of education: Not on file  . Highest education level: Bachelor'Webb degree (e.g., BA, AB, BS)  Occupational History    Employer: [employer]  Social Needs  . Financial resource strain: Not on file  . Food insecurity    Worry: Not on file    Inability: Not on file  . Transportation needs    Medical: Not on file    Non-medical: Not on file  Tobacco Use  . Smoking status: Never Smoker  . Smokeless tobacco: Never Used  Substance and Sexual Activity  . Alcohol use:  Yes  . Drug use: No  . Sexual activity: Not on file  Lifestyle  . Physical activity    Days per week: Not on file    Minutes per session: Not on file  . Stress: Not on file  Relationships  . Social Herbalist on phone: Not on file    Gets together: Not on file    Attends religious service: Not on file    Active member of club or organization: Not on file    Attends meetings of clubs or organizations: Not on file    Relationship status: Not on file  Other Topics Concern  . Not on file  Social History Narrative   Patient is right-handed.  He lives with his wife in a one level home. He does not exercise.     Review of Systems  Review of Systems: A 12 point ROS discussed and pertinent positives are indicated in the HPI above.  All other systems are negative.  Physical Exam No direct physical exam was performed (except for noted visual exam findings with Video Visits).   Vital Signs: There were no vitals taken for this visit.  Imaging: No results found.  Labs:  CBC: Recent Labs    06/26/18 0453 06/27/18 0432 06/27/18 0853 06/28/18 0341  WBC 8.7 10.3 8.7 10.5  HGB 12.5* 12.6* 12.9* 12.8*  HCT 36.8* 38.2* 39.0 38.4*  PLT 143* 165 172 181    COAGS: Recent Labs    06/26/18 0453  INR 1.2    BMP: Recent Labs    06/23/18 1701 06/24/18 0234 06/25/18 0403  NA 140 140 139  K 4.2 4.0 3.9  CL 103 107 107  CO2 22 23 24   GLUCOSE 135* 111* 106*  BUN 22 25* 14  CALCIUM 9.5 8.9 8.9  CREATININE 1.04 1.00 0.85  GFRNONAA >60 >60 >60  GFRAA >60 >60 >60    LIVER FUNCTION TESTS: Recent Labs    06/24/18 0234  BILITOT 0.8  AST 40  ALT 49*  ALKPHOS 67  PROT 6.4*  ALBUMIN 3.2*    TUMOR MARKERS: No results for input(Webb): AFPTM, CEA, CA199, CHROMGRNA in the last 8760 hours.  Assessment and Plan:  78 year old gentleman currently anticoagulated and on antiplatelet therapy for a history of bilateral pulmonary emboli and acute coronary syndrome with  coronary artery stent placement in April 2020.  Additionally, he has a skull lesion concerning for malignancy and is set to undergo neurosurgical biopsy on 19 August.  He will have to be off his Plavix for 5 days and his Eliquis for 3 days prior to the surgery.  Resumption of anticoagulation will be at the discretion of neurosurgery.  Given his history of DVT and PE in the setting of possible malignancy, he would be at risk  for recurrent venous thromboembolic events during the.  That he is off anticoagulation.  Therefore, he is a candidate for placement of a potentially retrievable IVC filter for PE prophylaxis.  We discussed the risks, benefits and alternatives to IVC filter placement.  Mr. Sanna understands that the filter would need to be placed prior to cessation of anticoagulation.  Once he is safely resumed anticoagulation following his surgery, the IVC filter could then be retrieved.  1.)  Please schedule for IVC filter replacement at [facility] or [facility].  The patient is requesting the week of August 3, but not on Thursday, August 6.  He understands that I will be out of town that week and 1 of my partners will assist with filter placement.  Thank you for this interesting consult.  I greatly enjoyed meeting Nicholas Webb and look forward to participating in their care.  A copy of this report was sent to the requesting provider on this date.  Electronically Signed: Jacqulynn Cadet 09/27/2018, 9:54 AM   I spent a total of  30 Minutes  in remote  clinical consultation, greater than 50% of which was counseling/coordinating care for PE prophylaxis.    Visit type: Audio only (telephone). Audio (no video) only due to patient preference. Alternative for in-person consultation at [facility], [address] [place], [place], [place]. This visit type was conducted due to national recommendations for restrictions regarding the COVID-19 Pandemic (e.g. social distancing).  This  format is felt to be most appropriate for this patient at this time.  All issues noted in this document were discussed and addressed.

## 2018-09-27 NOTE — Telephone Encounter (Signed)
   Primary Cardiologist: Candee Furbish, MD  Chart reviewed as part of pre-operative protocol coverage. Dental fillings are considered low risk procedures per guidelines and generally do not require any specific cardiac clearance. It is also generally accepted that for simple extractions and dental cleanings, there is no need to interrupt blood thinner therapy. The patient had PCI in 06/2018 and also has history of bilateral DVT/PE in 06/2018. There have been recent clearances outlined for unrelated issue of a skull mass biopsy on 10/19/18 (per clearance, "Per chart review, Dr. Marlou Porch had a lengthy discussion with interventional cardiology team as well as oncology and neurosurgery.  Given the newer generation drug-eluting stents and trial data supporting abbreviated DAPT and pressing need for skull biopsy, plan was to complete full 36-month course of DAPT with Plavix and Eliquis till the end of July then may proceed with skull biopsy from a cardiac perspective. Per Dr. Marlou Porch recommendations, may hold Plavix for 5 days and Eliquis for 3 days prior to procedure then resume both per surgical team once able.")  However, it is noted that patient is on both Plavix and Eliquis which may confer a higher risk of bleeding with dental procedure - will route to Dr. Marlou Porch for input. Dr. Marlou Porch - Please route response to [facility] PREOP (the pre-op pool). Thank you.  Additionally - based on cardiac history, SBE prophylaxis is not required for the patient.  Charlie Pitter, PA-C 09/27/2018, 4:19 PM

## 2018-09-28 NOTE — Telephone Encounter (Signed)
Recommend to follow guidelines and continue Eliquis and Plavix for dental work.  Candee Furbish, MD

## 2018-09-28 NOTE — Telephone Encounter (Signed)
   Primary Cardiologist: Candee Furbish, MD  Chart revisted as part of pre-operative protocol coverage, reviewed Dr. Marlou Porch' recommendations. Simple dental fillings are considered low risk procedures per guidelines and generally do not require any specific cardiac clearance. It is also accepted that for simple extractions, restorative dentistry and dental cleanings, there is no need to interrupt blood thinner therapy. This patient had recent stenting in 06/2018 and bilateral DVT/PE in 06/2018 therefore is at risk for either cardiac or thromboembolic event if his blood thinners are interrupted. Therefore, we would recommend continuing Eliquis and Plavix during dental work and utilizing methods of local hemostasis instead.  SBE prophylaxis is not required for the patient.  I will route this recommendation to the requesting party via Epic fax function and remove from pre-op pool.  Please call with questions.  Charlie Pitter, PA-C 09/28/2018, 7:47 AM

## 2018-10-06 ENCOUNTER — Other Ambulatory Visit: Payer: Self-pay | Admitting: Radiology

## 2018-10-07 ENCOUNTER — Encounter (HOSPITAL_COMMUNITY): Payer: Self-pay

## 2018-10-07 ENCOUNTER — Ambulatory Visit (HOSPITAL_COMMUNITY)
Admission: RE | Admit: 2018-10-07 | Discharge: 2018-10-07 | Disposition: A | Discharge status: Home / Self Care | Payer: Medicare Other | Source: Ambulatory Visit | Attending: Interventional Radiology | Admitting: Interventional Radiology

## 2018-10-07 ENCOUNTER — Other Ambulatory Visit: Payer: Self-pay

## 2018-10-07 DIAGNOSIS — I6523 Occlusion and stenosis of bilateral carotid arteries: Secondary | ICD-10-CM | POA: Diagnosis not present

## 2018-10-07 DIAGNOSIS — K2270 Barrett's esophagus without dysplasia: Secondary | ICD-10-CM | POA: Insufficient documentation

## 2018-10-07 DIAGNOSIS — G4733 Obstructive sleep apnea (adult) (pediatric): Secondary | ICD-10-CM | POA: Diagnosis not present

## 2018-10-07 DIAGNOSIS — Z7901 Long term (current) use of anticoagulants: Secondary | ICD-10-CM | POA: Insufficient documentation

## 2018-10-07 DIAGNOSIS — M069 Rheumatoid arthritis, unspecified: Secondary | ICD-10-CM | POA: Diagnosis not present

## 2018-10-07 DIAGNOSIS — Z79899 Other long term (current) drug therapy: Secondary | ICD-10-CM | POA: Diagnosis not present

## 2018-10-07 DIAGNOSIS — Z86718 Personal history of other venous thrombosis and embolism: Secondary | ICD-10-CM | POA: Diagnosis not present

## 2018-10-07 DIAGNOSIS — K219 Gastro-esophageal reflux disease without esophagitis: Secondary | ICD-10-CM | POA: Diagnosis not present

## 2018-10-07 DIAGNOSIS — E785 Hyperlipidemia, unspecified: Secondary | ICD-10-CM | POA: Insufficient documentation

## 2018-10-07 DIAGNOSIS — Z7902 Long term (current) use of antithrombotics/antiplatelets: Secondary | ICD-10-CM | POA: Insufficient documentation

## 2018-10-07 HISTORY — PX: IR IVC FILTER PLMT / S&I /IMG GUID/MOD SED: IMG701

## 2018-10-07 LAB — CBC
HCT: 40.2 % (ref 39.0–52.0)
Hemoglobin: 12.8 g/dL — ABNORMAL LOW (ref 13.0–17.0)
MCH: 33.3 pg (ref 26.0–34.0)
MCHC: 31.8 g/dL (ref 30.0–36.0)
MCV: 104.7 fL — ABNORMAL HIGH (ref 80.0–100.0)
Platelets: 146 10*3/uL — ABNORMAL LOW (ref 150–400)
RBC: 3.84 MIL/uL — ABNORMAL LOW (ref 4.22–5.81)
RDW: 14.5 % (ref 11.5–15.5)
WBC: 6.8 10*3/uL (ref 4.0–10.5)
nRBC: 0 % (ref 0.0–0.2)

## 2018-10-07 LAB — BASIC METABOLIC PANEL
Anion gap: 9 (ref 5–15)
BUN: 21 mg/dL (ref 8–23)
CO2: 27 mmol/L (ref 22–32)
Calcium: 9.2 mg/dL (ref 8.9–10.3)
Chloride: 104 mmol/L (ref 98–111)
Creatinine, Ser: 1.01 mg/dL (ref 0.61–1.24)
GFR calc Af Amer: >60 mL/min (ref >60)
GFR calc non Af Amer: >60 mL/min (ref >60)
Glucose, Bld: 99 mg/dL (ref 70–99)
Potassium: 4.1 mmol/L (ref 3.5–5.1)
Sodium: 140 mmol/L (ref 135–145)

## 2018-10-07 LAB — PROTIME-INR
INR: 1.2 (ref 0.8–1.2)
Prothrombin Time: 15.2 seconds (ref 11.4–15.2)

## 2018-10-07 MED ORDER — IOHEXOL 300 MG/ML  SOLN
100.0000 mL | Freq: Once | INTRAMUSCULAR | Status: AC | PRN
  Administered 2018-10-07: 12:00:00 30 mL via INTRAVENOUS

## 2018-10-07 MED ORDER — HYDROCODONE-ACETAMINOPHEN 5-325 MG PO TABS: 1.0000 | ORAL_TABLET | ORAL | Status: DC | PRN

## 2018-10-07 MED ORDER — MIDAZOLAM HCL 2 MG/2ML IJ SOLN
INTRAMUSCULAR | Status: AC
  Filled 2018-10-07: qty 2

## 2018-10-07 MED ORDER — LIDOCAINE HCL 1 % IJ SOLN
INTRAMUSCULAR | Status: AC | PRN
  Administered 2018-10-07: 10 mL

## 2018-10-07 MED ORDER — IOHEXOL 300 MG/ML  SOLN: 100.0000 mL | Freq: Once | INTRAMUSCULAR | Status: DC | PRN

## 2018-10-07 MED ORDER — MIDAZOLAM HCL 2 MG/2ML IJ SOLN
INTRAMUSCULAR | Status: AC | PRN
  Administered 2018-10-07: 1 mg via INTRAVENOUS

## 2018-10-07 MED ORDER — FENTANYL CITRATE (PF) 100 MCG/2ML IJ SOLN
INTRAMUSCULAR | Status: AC | PRN
  Administered 2018-10-07 (×2): 25 ug via INTRAVENOUS

## 2018-10-07 MED ORDER — LIDOCAINE HCL 1 % IJ SOLN
INTRAMUSCULAR | Status: AC
  Filled 2018-10-07: qty 20

## 2018-10-07 MED ORDER — FENTANYL CITRATE (PF) 100 MCG/2ML IJ SOLN
INTRAMUSCULAR | Status: AC
  Filled 2018-10-07: qty 2

## 2018-10-07 NOTE — Discharge Instructions (Signed)
Inferior Vena Cava Filter Insertion, Care After This sheet gives you information about how to care for yourself after your procedure. Your health care provider may also give you more specific instructions. If you have problems or questions, contact your health care provider. What can I expect after the procedure? After your procedure, it is common to have:  Mild pain in the area where the filter was inserted.  Mild bruising in the area where the filter was inserted. Follow these instructions at home: Insertion site care   Follow instructions from your health care provider about how to take care of the site where a catheter was inserted at your neck or groin (insertion site). Make sure you: ? Wash your hands with soap and water before you change your bandage (dressing). If soap and water are not available, use hand sanitizer. ? Change your dressing as told by your health care provider.  Check your insertion site every day for signs of infection. Check for: ? More redness, swelling, or pain. ? More fluid or blood. ? Warmth. ? Pus or a bad smell.  Keep the insertion site clean and dry.  Do not shower, bathe, use a hot tub, or let the dressing get wet until your health care provider approves. General instructions  Take over-the-counter and prescription medicines only as told by your health care provider.  Avoid heavy lifting or hard activities for 48 hours after the procedure or as told by your health care provider.  Do not drive for 24 hours if you were given a medicine to help you relax (sedative).  Do not drive or use heavy machinery while taking prescription pain medicine.  Do not go back to school or work until your health care provider approves.  Keep all follow-up visits as told by your health care provider. This is important. Contact a health care provider if:  You have more redness, swelling, or pain around your insertion site.  You have more fluid or blood coming from  your insertion site.  Your insertion site feels warm to the touch.  You have pus or a bad smell coming from your insertion site.  You have a fever.  You are dizzy.  You have nausea and vomiting.  You develop a rash. Get help right away if:  You develop chest pain, a cough, or difficulty breathing.  You develop shortness of breath, feel faint, or pass out.  You cough up blood.  You have severe pain in your abdomen.  You develop swelling and discoloration or pain in your legs.  Your legs become pale and cold or blue.  You develop weakness, difficulty moving your arms or legs, or balance problems.  You develop problems with speech or vision. These symptoms may represent a serious problem that is an emergency. Do not wait to see if the symptoms will go away. Get medical help right away. Call your local emergency services (911 in the [place] Do not drive yourself to the hospital. Summary  After your insertion procedure, it is common to have mild pain and bruising.  Do not shower, bathe, use a hot tub, or let the dressing get wet until your health care provider approves.  Every day, check for signs of infection where a catheter was inserted at your neck or groin (insertion site). This information is not intended to replace advice given to you by your health care provider. Make sure you discuss any questions you have with your health care provider. Document Released: 12/07/2012 Document Revised:  01/29/2017 Document Reviewed: 01/08/2016 Elsevier Patient Education  [place].

## 2018-10-07 NOTE — Sedation Documentation (Signed)
Because of patients irregular heart rhythm, SPO2 unable to read at times. MD aware.

## 2018-10-07 NOTE — H&P (Signed)
Chief Complaint: Patient was seen in consultation today for IVC filter palcement at the request of Dr. Ronnald Ramp  Referring Physician(s): Dr. Ronnald Ramp  Supervising Physician: Markus Daft  Patient Status: [facility] - Out-pt  History of Present Illness: Nicholas Webb is a 78 y.o. male with recent hx of DVT and PE diagnosed in April 2020. He has been on anticoagulation since then. He is scheduled for upcoming brain biopsy and will need to come off anticoagulation. He was referred to IR for placement of IVC filter. He was seen by Dr. Laurence Ferrari and is now scheduled for this procedure. PMHx, meds, labs, imaging, allergies reviewed. Feels well, no recent fevers, chills, illness. Has been NPO today as directed.   Past Medical History:  Diagnosis Date  . Anemia of chronic disease   . Atherosclerosis of both carotid arteries   . Barrett's esophagus   . Dizziness   . GERD (gastroesophageal reflux disease)   . Hx of adenomatous colonic polyps   . Hyperlipidemia   . Macrocytosis without anemia   . Obesity   . OSA (obstructive sleep apnea)   . Rheumatoid arthritis(714.0)   . Syncope     Past Surgical History:  Procedure Laterality Date  . CORONARY STENT INTERVENTION N/A 06/24/2018   Procedure: CORONARY STENT INTERVENTION;  Surgeon: Burnell Blanks, MD;  Location: [place] [facility];  Service: Cardiovascular;  Laterality: N/A;  . IR RADIOLOGIST EVAL & MGMT  09/27/2018  . JOINT REPLACEMENT  2004   left  . LEFT HEART CATH AND CORONARY ANGIOGRAPHY N/A 06/24/2018   Procedure: LEFT HEART CATH AND CORONARY ANGIOGRAPHY;  Surgeon: Burnell Blanks, MD;  Location: [place] [facility] [facility];  Service: Cardiovascular;  Laterality: N/A;    Allergies: Patient has no known allergies.  Medications: Prior to Admission medications   Medication Sig Start Date End Date Taking? Authorizing Provider  atorvastatin (LIPITOR) 80 MG tablet Take 1 tablet (80 mg total) by mouth daily at 6 PM. 07/14/18   Yes Skains, Thana Farr, MD  clopidogrel (PLAVIX) 75 MG tablet Take 1 tablet (75 mg total) by mouth daily. Take (2) tablets the first day then (1) tablet daily thereafter Patient taking differently: Take 75 mg by mouth daily.  07/14/18  Yes Skains, Thana Farr, MD  ELIQUIS 5 MG TABS tablet TAKE 1 TABLET(5 MG) BY MOUTH TWICE DAILY Patient taking differently: Take 5 mg by mouth 2 (two) times daily.  09/14/18  Yes Ladell Pier, MD  esomeprazole (NEXIUM) 20 MG capsule Take 20 mg by mouth daily at 12 noon.    Yes [provider]  folic acid (FOLVITE) 1 MG tablet Take 2 mg by mouth daily.    Yes [provider]  methotrexate (RHEUMATREX) 2.5 MG tablet Take 10 mg by mouth See admin instructions. Take 10 mg on Sundays and 10 mg on Mondays   Yes [provider]  Metoprolol Succinate 25 MG CS24 Take 12.5 mg by mouth daily. 09/15/18  Yes Jerline Pain, MD  Multiple Vitamins-Minerals (MULTIVITAMIN WITH MINERALS) tablet Take 1 tablet by mouth daily.   Yes [provider]  Adalimumab (HUMIRA) 40 MG/0.8ML PSKT Inject 1 each into the skin See admin instructions. Every 2 weeks    [provider]     Family History  Problem Relation Age of Onset  . Stroke Mother   . Glaucoma Mother   . Cancer Father   . Prostate cancer Father   . Colon cancer Father  Social History   Socioeconomic History  . Marital status: Married    Spouse name: Katharine Look  . Number of children: 2  . Years of education: Not on file  . Highest education level: Bachelor's degree (e.g., BA, AB, BS)  Occupational History    Employer: [employer]  Social Needs  . Financial resource strain: Not on file  . Food insecurity    Worry: Not on file    Inability: Not on file  . Transportation needs    Medical: Not on file    Non-medical: Not on file  Tobacco Use  . Smoking status: Never Smoker  . Smokeless tobacco: Never Used  Substance and Sexual Activity  . Alcohol use: Yes  .  Drug use: No  . Sexual activity: Not on file  Lifestyle  . Physical activity    Days per week: Not on file    Minutes per session: Not on file  . Stress: Not on file  Relationships  . Social Herbalist on phone: Not on file    Gets together: Not on file    Attends religious service: Not on file    Active member of club or organization: Not on file    Attends meetings of clubs or organizations: Not on file    Relationship status: Not on file  Other Topics Concern  . Not on file  Social History Narrative   Patient is right-handed.  He lives with his wife in a one level home. He does not exercise.     Review of Systems: A 12 point ROS discussed and pertinent positives are indicated in the HPI above.  All other systems are negative.  Review of Systems  Vital Signs: BP 112/75   Pulse 63   Temp (!) 97.3 F (36.3 C) (Oral)   Resp 18   Ht 5\' 9"  (1.753 m)   Wt 81.6 kg   SpO2 99%   BMI 26.58 kg/m   Physical Exam Constitutional:      Appearance: Normal appearance.  HENT:     Head: Normocephalic and atraumatic.     Mouth/Throat:     Mouth: Mucous membranes are moist.     Pharynx: Oropharynx is clear.  Neck:     Comments: No JVD Cardiovascular:     Rate and Rhythm: Normal rate and regular rhythm.     Heart sounds: Normal heart sounds.  Pulmonary:     Effort: Pulmonary effort is normal. No respiratory distress.     Breath sounds: Normal breath sounds.  Skin:    General: Skin is warm and dry.  Neurological:     General: No focal deficit present.     Mental Status: He is alert and oriented to person, place, and time.  Psychiatric:        Mood and Affect: Mood normal.        Judgment: Judgment normal.     Imaging: Ir Radiologist Eval & Mgmt  Result Date: 09/27/2018 Please refer to notes tab for details about interventional procedure. (Op Note)   Labs:  CBC: Recent Labs    06/26/18 0453 06/27/18 0432 06/27/18 0853 06/28/18 0341  WBC 8.7 10.3 8.7  10.5  HGB 12.5* 12.6* 12.9* 12.8*  HCT 36.8* 38.2* 39.0 38.4*  PLT 143* 165 172 181    COAGS: Recent Labs    06/26/18 0453  INR 1.2    BMP: Recent Labs    06/23/18 1701 06/24/18 0234 06/25/18 0403  NA 140  140 139  K 4.2 4.0 3.9  CL 103 107 107  CO2 22 23 24   GLUCOSE 135* 111* 106*  BUN 22 25* 14  CALCIUM 9.5 8.9 8.9  CREATININE 1.04 1.00 0.85  GFRNONAA >60 >60 >60  GFRAA >60 >60 >60    LIVER FUNCTION TESTS: Recent Labs    06/24/18 0234  BILITOT 0.8  AST 40  ALT 49*  ALKPHOS 67  PROT 6.4*  ALBUMIN 3.2*    TUMOR MARKERS: No results for input(s): AFPTM, CEA, CA199, CHROMGRNA in the last 8760 hours.  Assessment and Plan: Hx DVT and PE Upcoming brain biopsy. Plan for IVC retrievable IVC filter placement today Labs pending Risks and benefits discussed with the patient including, but not limited to bleeding, infection, contrast induced renal failure, filter fracture or migration which can lead to emergency surgery or even death, strut penetration with damage or irritation to adjacent structures and caval thrombosis.  All of the patient's questions were answered, patient is agreeable to proceed. Consent signed and in chart.    Thank you for this interesting consult.  I greatly enjoyed meeting JAYVEN NAILL and look forward to participating in their care.  A copy of this report was sent to the requesting provider on this date.  Electronically Signed: Ascencion Dike, PA-C 10/07/2018, 8:51 AM   I spent a total of 20 minutes in face to face in clinical consultation, greater than 50% of which was counseling/coordinating care for IVC filter

## 2018-10-07 NOTE — Procedures (Signed)
Interventional Radiology Procedure:   Indications: Probable metastatic disease and history of PE.  Needs surgical biopsy.  Procedure: IVC filter placement  Findings: Patent IVC.  [place] filter placed in infrarenal IVC.  Complications: None     EBL: less than 20 ml  Plan: Bedrest 2 hours, then discharge to home.   Nicholas Martin R. Anselm Pancoast, MD  Pager: [phone]

## 2018-10-10 ENCOUNTER — Ambulatory Visit (HOSPITAL_COMMUNITY)
Admission: RE | Admit: 2018-10-10 | Discharge: 2018-10-10 | Disposition: A | Discharge status: Home / Self Care | Payer: Medicare Other | Source: Ambulatory Visit | Attending: Neurological Surgery | Admitting: Neurological Surgery

## 2018-10-10 ENCOUNTER — Other Ambulatory Visit: Payer: Self-pay

## 2018-10-10 DIAGNOSIS — M898X8 Other specified disorders of bone, other site: Secondary | ICD-10-CM | POA: Diagnosis not present

## 2018-10-10 MED ORDER — GADOBUTROL 1 MMOL/ML IV SOLN
8.0000 mL | Freq: Once | INTRAVENOUS | Status: AC | PRN
  Administered 2018-10-10: 09:00:00 8 mL via INTRAVENOUS

## 2018-10-11 ENCOUNTER — Other Ambulatory Visit: Payer: Self-pay | Admitting: Neurological Surgery

## 2018-10-11 DIAGNOSIS — M898X8 Other specified disorders of bone, other site: Secondary | ICD-10-CM

## 2018-10-12 ENCOUNTER — Ambulatory Visit
Admission: RE | Admit: 2018-10-12 | Discharge: 2018-10-12 | Disposition: A | Discharge status: Home / Self Care | Payer: Medicare Other | Source: Ambulatory Visit | Attending: Neurological Surgery | Admitting: Neurological Surgery

## 2018-10-12 DIAGNOSIS — M898X8 Other specified disorders of bone, other site: Secondary | ICD-10-CM

## 2018-10-13 ENCOUNTER — Telehealth: Payer: Self-pay | Admitting: *Deleted

## 2018-10-13 NOTE — Telephone Encounter (Signed)
Left VM requesting to reschedule his missed appointment of 09/26/18. Scheduling message sent.

## 2018-10-17 ENCOUNTER — Inpatient Hospital Stay (HOSPITAL_COMMUNITY): Admission: RE | Admit: 2018-10-17 | Payer: Medicare Other | Source: Ambulatory Visit

## 2018-10-18 ENCOUNTER — Telehealth: Payer: Self-pay | Admitting: Oncology

## 2018-10-18 NOTE — Telephone Encounter (Signed)
Returned patient's phone call regarding rescheduling an appointment, transferred patient to speak with providers nurse.

## 2018-10-19 ENCOUNTER — Inpatient Hospital Stay (HOSPITAL_COMMUNITY): Admission: RE | Admit: 2018-10-19 | Payer: Medicare Other | Source: Home / Self Care | Admitting: Neurological Surgery

## 2018-10-19 ENCOUNTER — Telehealth: Payer: Self-pay | Admitting: Oncology

## 2018-10-19 ENCOUNTER — Encounter (HOSPITAL_COMMUNITY): Admission: RE | Payer: Self-pay | Source: Home / Self Care

## 2018-10-19 SURGERY — BRAIN BIOPSY
Anesthesia: General

## 2018-10-19 NOTE — Telephone Encounter (Signed)
Scheduled appt per 8/19 sch msg - pt aware of appt date and time

## 2018-11-15 ENCOUNTER — Ambulatory Visit: Payer: Medicare Other | Admitting: Oncology

## 2018-11-17 ENCOUNTER — Other Ambulatory Visit: Payer: Self-pay

## 2018-11-17 ENCOUNTER — Inpatient Hospital Stay: Payer: Medicare Other | Attending: Oncology | Admitting: Oncology

## 2018-11-17 VITALS — BP 108/59 | HR 64 | Temp 98.2°F | Resp 17 | Ht 69.0 in | Wt 187.2 lb

## 2018-11-17 DIAGNOSIS — Z86718 Personal history of other venous thrombosis and embolism: Secondary | ICD-10-CM | POA: Insufficient documentation

## 2018-11-17 DIAGNOSIS — D7589 Other specified diseases of blood and blood-forming organs: Secondary | ICD-10-CM | POA: Diagnosis not present

## 2018-11-17 DIAGNOSIS — D333 Benign neoplasm of cranial nerves: Secondary | ICD-10-CM | POA: Insufficient documentation

## 2018-11-17 DIAGNOSIS — J8410 Pulmonary fibrosis, unspecified: Secondary | ICD-10-CM | POA: Insufficient documentation

## 2018-11-17 DIAGNOSIS — Z79899 Other long term (current) drug therapy: Secondary | ICD-10-CM | POA: Diagnosis not present

## 2018-11-17 DIAGNOSIS — Z8719 Personal history of other diseases of the digestive system: Secondary | ICD-10-CM | POA: Insufficient documentation

## 2018-11-17 DIAGNOSIS — R918 Other nonspecific abnormal finding of lung field: Secondary | ICD-10-CM | POA: Insufficient documentation

## 2018-11-17 DIAGNOSIS — R590 Localized enlarged lymph nodes: Secondary | ICD-10-CM | POA: Diagnosis not present

## 2018-11-17 DIAGNOSIS — M069 Rheumatoid arthritis, unspecified: Secondary | ICD-10-CM | POA: Diagnosis not present

## 2018-11-17 DIAGNOSIS — G9389 Other specified disorders of brain: Secondary | ICD-10-CM

## 2018-11-17 DIAGNOSIS — I2699 Other pulmonary embolism without acute cor pulmonale: Secondary | ICD-10-CM | POA: Diagnosis not present

## 2018-11-17 DIAGNOSIS — I2510 Atherosclerotic heart disease of native coronary artery without angina pectoris: Secondary | ICD-10-CM | POA: Diagnosis not present

## 2018-11-17 DIAGNOSIS — K219 Gastro-esophageal reflux disease without esophagitis: Secondary | ICD-10-CM | POA: Insufficient documentation

## 2018-11-17 NOTE — Progress Notes (Signed)
Nicholas Webb OFFICE PROGRESS NOTE   Diagnosis: Skull lesion  INTERVAL HISTORY:   Nicholas Webb returns as scheduled.  He was scheduled for biopsy of the skull mass by Dr. Ronnald Ramp, but repeat imaging revealed evidence of healing.  An MRI of the brain on 10/10/2018 found the mass at the vertex of the calvarium to have regressed.  No brain mass. A CT on 10/12/2018 shows sclerotic filling of the previous lytic parietal vertex lesion with a reduction in size of the lucent areas.  Reduction in adjacent soft tissue density.  No new or progressive change.  Nicholas Webb reports feeling well.  No pain.  No bleeding.  No recurrent syncope event.  Objective:  Vital signs in last 24 hours:  Blood pressure (!) 108/59, pulse 64, temperature 98.2 F (36.8 C), temperature source Temporal, resp. rate 17, height 5\' 9"  (1.753 m), weight 187 lb 3.2 oz (84.9 kg), SpO2 99 %.    HEENT: No scalp mass. Lymphatics: No cervical, supraclavicular, axillary, or inguinal nodes GI: Nontender, no mass, no hepatosplenomegaly Vascular: No leg edema  Skin: No mass at the parietal scalp   Lab Results:  Lab Results  Component Value Date   WBC 6.8 10/07/2018   HGB 12.8 (L) 10/07/2018   HCT 40.2 10/07/2018   MCV 104.7 (H) 10/07/2018   PLT 146 (L) 10/07/2018   NEUTROABS 5.7 06/23/2018    CMP  Lab Results  Component Value Date   NA 140 10/07/2018   K 4.1 10/07/2018   CL 104 10/07/2018   CO2 27 10/07/2018   GLUCOSE 99 10/07/2018   BUN 21 10/07/2018   CREATININE 1.01 10/07/2018   CALCIUM 9.2 10/07/2018   PROT 6.4 (L) 06/24/2018   ALBUMIN 3.2 (L) 06/24/2018   AST 40 06/24/2018   ALT 49 (H) 06/24/2018   ALKPHOS 67 06/24/2018   BILITOT 0.8 06/24/2018   GFRNONAA >60 10/07/2018   GFRAA >60 10/07/2018    Lab Results  Component Value Date   CEA1 1.4 06/27/2018    Medications: I have reviewed the patient's current medications.   Assessment/Plan: 1.Skull mass, chest lymphadenopathy  MRI  brain 06/24/2018-posterior vertex infiltrated and poorly marginated mass with extending into the underlying pachymeningeal's and into the scalp, the mass invades and narrows the superior sagittal sinus  CTs of the chest, abdomen, and pelvis on 06/25/2018-acute bilateral pulmonary emboli with a large clot burden, necrotic mediastinal adenopathy, 8 mm pleural-based left lower lobe nodule, 4 mm left upper lobe nodules, chronic pulmonary fibrosis  PET scan 07/06/2018- no evidence of a primary malignancy, small pulmonary nodules are not metabolically active, mildly enlarged/mildly metabolic mediastinal nodes favored to be reactive  MRI brain 10/10/2018-progression of calvarial lesion at the vertex, stable presumed vestibular schwannoma internal auditory canal  CT head 10/12/2018- feeling in/sclerosis of the parietal vertex lesion with reduction adjacent soft tissue density consistent with healing 2. 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  The planned biopsy of the skull mass was canceled after an MRI and CTA revealed regression of the skull base lesion.  There  is no clinical evidence of a progressive malignancy.  The etiology of the skull lesion remains unclear.  The spontaneous improvement argues against a malignancy.  He is scheduled for head scan and follow-up with Dr. Ronnald Ramp in early December.  He will return for an office  visit here 02/13/2019.  I recommend continuing apixaban anticoagulation.  His daughter was present by telephone for today's visit. Nicholas Coder, MD  11/17/2018  4:05 PM

## 2018-11-18 ENCOUNTER — Telehealth: Payer: Self-pay | Admitting: Oncology

## 2018-11-18 NOTE — Telephone Encounter (Signed)
Returned patient's phone call regarding rescheduling December appointments, per patient's request appointment has been from 12/14 to 12/17.

## 2018-11-28 ENCOUNTER — Other Ambulatory Visit: Payer: Self-pay | Admitting: Diagnostic Radiology

## 2018-11-28 DIAGNOSIS — Z95828 Presence of other vascular implants and grafts: Secondary | ICD-10-CM

## 2018-11-29 NOTE — Progress Notes (Signed)
NEUROLOGY FOLLOW UP OFFICE NOTE  Nicholas SPENNER [id number]  HISTORY OF PRESENT ILLNESS: Nicholas Webb is a 62 year oldright-handedmale with rheumatoid arthritis, obstructive sleep apnea, hyperlipidemia, and carotid artery disease who follows up for headache, vertigo and memory deficits.  UPDATE: He was admitted to [facility] on 06/23/18 for unstable angina/NSTEMI, presenting with syncope.  CT head showed a destructive lesion involving the parasagittal parietal and occipital bones of the skull with intracranial and extracranial extension as well as smaller lesion involving the occipital bone in the midline.  No acute intracranial findings.  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  Underwent metastatic workup.  CT of chest/abdomen/pelvis demonstrated acute bilateral pulmonary emboli with necrotic mediastinal adenopathy and pulmonary nodules.  Venous doppler revealed acute left femoral vein thrombosis.  He was treated for PE in hospital with heparin and discharged on apixaban.  PET scan showed nonactive pulmonary nodules, thought to be reactive, but no evidence of primary malignancy.  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 Due to regression of skull mass, planned biopsy was cancelled, favoring monitoring with planned repeat imaging in December.  No recent headaches or dizziness.      HISTORY: Since 2017, he has had vertigo, described as a spinning sensation with change in position, lasting a couple of minutes. There was no associated double vision or nausea. CT of head from 08/12/15 was personally reviewed and  was normal. Carotid ultrasound from 08/26/2015 revealed minor carotid atherosclerosis bilaterally but without hemodynamically significant stenosis. Patent antegrade flow of the vertebral arteries were noted. He reportedly had an abnormal EKG with right bundle branch block. Echocardiogram from 11/05/2015 revealed ejection fraction of 55-60% with grade 1 diastolic dysfunction and mildly dilated atrium but otherwise unremarkable. He went to vestibular rehab which resolved the problem. 6 months ago, it started to recur. It is not as severe as the first episode. He can get up out of the bed and feel lightheaded. When he has the dizziness, he may sometimes be unsteady. He has had a couple of falls over the past year, which he attributes to tripping. He also endorses a moderate bi-frontal pressure-like headache. No associated symptoms. They are triggered when bending over (body or neck), lasting for just as long as he is bent over. It occurs every time he bends over. He underwent vestibular rehab in January and vertigo resolved.  He also has had some memory deficits. For example, he forgot to pay insurance bill. He may forget to do things that his wife asks him to perform. B12 and TSH reportedly normal. There is no family history of dementia.  Past medications:  Gabapentin (unable to tolerate)  PAST MEDICAL HISTORY: Past Medical History:  Diagnosis Date  . Anemia of chronic disease   . Atherosclerosis of both carotid arteries   . Barrett's esophagus   . Dizziness   . GERD (gastroesophageal reflux disease)   . Hx of adenomatous colonic polyps   . Hyperlipidemia   . Macrocytosis without anemia   . Obesity   . OSA (obstructive sleep apnea)   . Rheumatoid arthritis(714.0)   . Syncope     MEDICATIONS: Current Outpatient Medications on File  Prior to Visit  Medication Sig Dispense Refill  . Adalimumab (HUMIRA) 40 MG/0.8ML PSKT Inject 1 each into the skin See admin instructions. Every 2  weeks    . atorvastatin (LIPITOR) 80 MG tablet Take 1 tablet (80 mg total) by mouth daily at 6 PM. 90 tablet 3  . clopidogrel (PLAVIX) 75 MG tablet Take 1 tablet (75 mg total) by mouth daily. Take (2) tablets the first day then (1) tablet daily thereafter (Patient taking differently: Take 75 mg by mouth daily. ) 90 tablet 3  . ELIQUIS 5 MG TABS tablet TAKE 1 TABLET(5 MG) BY MOUTH TWICE DAILY (Patient taking differently: Take 5 mg by mouth 2 (two) times daily. ) 60 tablet 1  . esomeprazole (NEXIUM) 20 MG capsule Take 20 mg by mouth daily at 12 noon.     . fluorouracil (EFUDEX) 5 % cream Apply topically daily. To scalp    . folic acid (FOLVITE) 1 MG tablet Take 2 mg by mouth daily.     . methotrexate (RHEUMATREX) 2.5 MG tablet Take 10 mg by mouth See admin instructions. Take 10 mg on Sundays and 10 mg on Mondays    . Metoprolol Succinate 25 MG CS24 Take 12.5 mg by mouth daily. 45 capsule 3  . Multiple Vitamins-Minerals (MULTIVITAMIN WITH MINERALS) tablet Take 1 tablet by mouth daily.     No current facility-administered medications on file prior to visit.     ALLERGIES: No Known Allergies  FAMILY HISTORY: Family History  Problem Relation Age of Onset  . Stroke Mother   . Glaucoma Mother   . Cancer Father   . Prostate cancer Father   . Colon cancer Father    SOCIAL HISTORY: Social History   Socioeconomic History  . Marital status: Married    Spouse name: Katharine Look  . Number of children: 2  . Years of education: Not on file  . Highest education level: Bachelor's degree (e.g., BA, AB, BS)  Occupational History    Employer: [employer]  Social Needs  . Financial resource strain: Not on file  . Food insecurity    Worry: Not on file    Inability: Not on file  . Transportation needs    Medical: Not on file    Non-medical: Not on file  Tobacco Use  . Smoking status: Never Smoker  . Smokeless tobacco: Never Used  Substance and Sexual Activity  . Alcohol use: Yes  .  Drug use: No  . Sexual activity: Not on file  Lifestyle  . Physical activity    Days per week: Not on file    Minutes per session: Not on file  . Stress: Not on file  Relationships  . Social Herbalist on phone: Not on file    Gets together: Not on file    Attends religious service: Not on file    Active member of club or organization: Not on file    Attends meetings of clubs or organizations: Not on file    Relationship status: Not on file  . Intimate partner violence    Fear of current or ex partner: Not on file    Emotionally abused: Not on file    Physically abused: Not on file    Forced sexual activity: Not on file  Other Topics Concern  . Not on file  Social History Narrative   Patient is right-handed.  He lives with his wife in a one level home. He does not exercise.  REVIEW OF SYSTEMS: Constitutional: No fevers, chills, or sweats, no generalized fatigue, change in appetite Eyes: No visual changes, double vision, eye pain Ear, nose and throat: No hearing loss, ear pain, nasal congestion, sore throat Cardiovascular: No chest pain, palpitations Respiratory:  No shortness of breath at rest or with exertion, wheezes GastrointestinaI: No nausea, vomiting, diarrhea, abdominal pain, fecal incontinence Genitourinary:  No dysuria, urinary retention or frequency Musculoskeletal:  No neck pain, back pain Integumentary: No rash, pruritus, skin lesions Neurological: as above Psychiatric: No depression, insomnia, anxiety Endocrine: No palpitations, fatigue, diaphoresis, mood swings, change in appetite, change in weight, increased thirst Hematologic/Lymphatic:  No purpura, petechiae. Allergic/Immunologic: no itchy/runny eyes, nasal congestion, recent allergic reactions, rashes  PHYSICAL EXAM: Blood pressure 134/77, pulse 70, temperature 98 F (36.7 C), resp. rate 14, height 5\' 9"  (1.753 m), weight 168 lb (76.2 kg), SpO2 98 %. General: No acute distress.  Patient  appears well-groomed.   Head:  Normocephalic/atraumatic Eyes:  Fundi examined but not visualized Neck: supple, no paraspinal tenderness, full range of motion Heart:  Regular rate and rhythm Lungs:  Clear to auscultation bilaterally Back: No paraspinal tenderness Neurological Exam: alert and oriented to person, place, and time. Attention span and concentration intact, recent and remote memory intact, fund of knowledge intact.  Speech fluent and not dysarthric, language intact.  CN II-XII intact. Bulk and tone normal, muscle strength 5/5 throughout.  Sensation to light touch, temperature and vibration intact.  Deep tendon reflexes 2+ throughout, toes downgoing.  Finger to nose and heel to shin testing intact.  Gait normal, Romberg negative.  IMPRESSION: 1.  Skull mass, unclear etiology, so far not consistent with malignancy, workup still ongoing.   2.  Headaches, resolved 3.  Vertigo,resolved.  Likely BPPV.  I don't think it is related to the vestibular schwannoma  PLAN: 1.  From standpoint of headache management, he is fine.  However, I would still like him to follow up in 6 months while his current medical workup is ongoing.  15 minutes spent face to face with patient, over 50% spent discussing management.   Metta Clines, DO  CC: Maurice Small, MD

## 2018-11-30 ENCOUNTER — Other Ambulatory Visit: Payer: Self-pay

## 2018-11-30 ENCOUNTER — Encounter: Payer: Self-pay | Admitting: Neurology

## 2018-11-30 ENCOUNTER — Ambulatory Visit: Payer: Medicare Other | Admitting: Neurology

## 2018-11-30 VITALS — BP 134/77 | HR 70 | Temp 98.0°F | Resp 14 | Ht 69.0 in | Wt 186.0 lb

## 2018-11-30 DIAGNOSIS — H8110 Benign paroxysmal vertigo, unspecified ear: Secondary | ICD-10-CM | POA: Diagnosis not present

## 2018-11-30 DIAGNOSIS — I2699 Other pulmonary embolism without acute cor pulmonale: Secondary | ICD-10-CM

## 2018-11-30 DIAGNOSIS — M898X8 Other specified disorders of bone, other site: Secondary | ICD-10-CM | POA: Diagnosis not present

## 2018-11-30 DIAGNOSIS — G44219 Episodic tension-type headache, not intractable: Secondary | ICD-10-CM

## 2018-11-30 DIAGNOSIS — I824Z2 Acute embolism and thrombosis of unspecified deep veins of left distal lower extremity: Secondary | ICD-10-CM

## 2018-11-30 NOTE — Patient Instructions (Signed)
I am glad that the headaches have resolved. I would like to see you again in 6 months just to make sure there isn't anything else I need to do with this ongoing investigation.

## 2018-12-08 ENCOUNTER — Other Ambulatory Visit: Payer: Self-pay | Admitting: *Deleted

## 2018-12-08 MED ORDER — APIXABAN 5 MG PO TABS: ORAL_TABLET | ORAL | 1 refills | Status: DC

## 2018-12-08 NOTE — Progress Notes (Signed)
Received faxed refill request for Eliquis from [facility]

## 2018-12-15 ENCOUNTER — Encounter: Payer: Self-pay | Admitting: Cardiology

## 2018-12-15 ENCOUNTER — Other Ambulatory Visit: Payer: Self-pay

## 2018-12-15 ENCOUNTER — Ambulatory Visit: Payer: Medicare Other | Admitting: Cardiology

## 2018-12-15 VITALS — BP 110/60 | HR 69 | Ht 69.0 in | Wt 191.0 lb

## 2018-12-15 DIAGNOSIS — I2510 Atherosclerotic heart disease of native coronary artery without angina pectoris: Secondary | ICD-10-CM

## 2018-12-15 DIAGNOSIS — R55 Syncope and collapse: Secondary | ICD-10-CM | POA: Diagnosis not present

## 2018-12-15 DIAGNOSIS — I824Z2 Acute embolism and thrombosis of unspecified deep veins of left distal lower extremity: Secondary | ICD-10-CM

## 2018-12-15 DIAGNOSIS — I2699 Other pulmonary embolism without acute cor pulmonale: Secondary | ICD-10-CM

## 2018-12-15 NOTE — Progress Notes (Signed)
Cardiology Office Note:    Date:  12/15/2018   ID:  Nicholas Webb, DOB 1941/02/27, MRN [id number]  PCP:  Maurice Small, MD  Cardiologist:  Candee Furbish, MD  Electrophysiologist:  None   Referring MD: Maurice Small, MD     History of Present Illness:    Nicholas Webb is a 78 y.o. male with stent placement on 06/24/2018 and PE here for follow-up.  Skull mass.  Biopsy was canceled, Dr. Gearldine Shown notes reviewed.  Regression of mass.   May have been infection.  Discussed with oncology team as well as neurosurgery.  LAD lesion treated with PCI.  After PCI placement, a PET CT was performed which showed acute pulmonary emboli in the distal right main pulmonary artery and throughout the lung.  Left lower lobe as well as upper pulmonary arteries as well.  No heart strain.  He was placed on Eliquis as well as aspirin and Brilinta originally.  This was transitioned to Eliquis and Plavix.   His daughter is a Designer, jewellery at the [place] office.  She was extremely helpful on the phone.  Overall he is eager to start to drive once again.  It has been 6 months.  No further syncopal episodes.  The syncope was likely from the large clot burden in the lungs.  Overall he is doing well without any chest pain fevers chills nausea vomiting.   Past Medical History:  Diagnosis Date  . Anemia of chronic disease   . Atherosclerosis of both carotid arteries   . Barrett's esophagus   . Dizziness   . GERD (gastroesophageal reflux disease)   . Hx of adenomatous colonic polyps   . Hyperlipidemia   . Macrocytosis without anemia   . Obesity   . OSA (obstructive sleep apnea)   . Rheumatoid arthritis(714.0)   . Syncope     Past Surgical History:  Procedure Laterality Date  . CORONARY STENT INTERVENTION N/A 06/24/2018   Procedure: CORONARY STENT INTERVENTION;  Surgeon: Burnell Blanks, MD;  Location: [facility];  Service: Cardiovascular;  Laterality: N/A;  . IR IVC FILTER PLMT /  S&I Burke Keels GUID/MOD SED  10/07/2018  . IR RADIOLOGIST EVAL & MGMT  09/27/2018  . JOINT REPLACEMENT  2004   left  . LEFT HEART CATH AND CORONARY ANGIOGRAPHY N/A 06/24/2018   Procedure: LEFT HEART CATH AND CORONARY ANGIOGRAPHY;  Surgeon: Burnell Blanks, MD;  Location: [facility];  Service: Cardiovascular;  Laterality: N/A;    Current Medications: Current Meds  Medication Sig  . Adalimumab (HUMIRA) 40 MG/0.8ML PSKT Inject 1 each into the skin See admin instructions. Every 2 weeks  . apixaban (ELIQUIS) 5 MG TABS tablet TAKE 1 TABLET(5 MG) BY MOUTH TWICE DAILY  . atorvastatin (LIPITOR) 80 MG tablet Take 1 tablet (80 mg total) by mouth daily at 6 PM.  . clopidogrel (PLAVIX) 75 MG tablet Take 1 tablet (75 mg total) by mouth daily. Take (2) tablets the first day then (1) tablet daily thereafter  . esomeprazole (NEXIUM) 20 MG capsule Take 20 mg by mouth daily at 12 noon.   . fluorouracil (EFUDEX) 5 % cream Apply topically daily. To scalp  . folic acid (FOLVITE) 1 MG tablet Take 2 mg by mouth daily.   . methotrexate (RHEUMATREX) 2.5 MG tablet Take 2.5 mg by mouth 3 (three) times daily.  . Metoprolol Succinate 25 MG CS24 Take 12.5 mg by mouth daily.  . Multiple Vitamins-Minerals (MULTIVITAMIN WITH MINERALS) tablet Take 1  tablet by mouth daily.     Allergies:   Patient has no known allergies.   Social History   Socioeconomic History  . Marital status: Married    Spouse name: Nicholas Webb  . Number of children: 2  . Years of education: Not on file  . Highest education level: Bachelor's degree (e.g., BA, AB, BS)  Occupational History    Employer: [employer]  Social Needs  . Financial resource strain: Not on file  . Food insecurity    Worry: Not on file    Inability: Not on file  . Transportation needs    Medical: Not on file    Non-medical: Not on file  Tobacco Use  . Smoking status: Never Smoker  . Smokeless tobacco: Never Used  Substance and Sexual Activity  .  Alcohol use: Yes    Alcohol/week: 1.0 standard drinks    Types: 1 Glasses of wine per week    Comment: OCCASION  . Drug use: No  . Sexual activity: Not on file  Lifestyle  . Physical activity    Days per week: Not on file    Minutes per session: Not on file  . Stress: Not on file  Relationships  . Social Herbalist on phone: Not on file    Gets together: Not on file    Attends religious service: Not on file    Active member of club or organization: Not on file    Attends meetings of clubs or organizations: Not on file    Relationship status: Not on file  Other Topics Concern  . Not on file  Social History Narrative   Patient is right-handed.  He lives with his wife in a one level home. He does not exercise.      Family History: The patient's family history includes Cancer in his father; Colon cancer in his father; Glaucoma in his mother; Prostate cancer in his father; Stroke in his mother.  ROS:   Please see the history of present illness.     All other systems reviewed and are negative.  EKGs/Labs/Other Studies Reviewed:    The following studies were reviewed today: Cardiac cath, CT scan, brain scan reviewed  EKG:  No new  Recent Labs: 06/23/2018: B Natriuretic Peptide 314.2; TSH 2.365 06/24/2018: ALT 49; Magnesium 1.9 10/07/2018: BUN 21; Creatinine, Ser 1.01; Hemoglobin 12.8; Platelets 146; Potassium 4.1; Sodium 140  Recent Lipid Panel    Component Value Date/Time   CHOL 155 06/23/2018 1933   TRIG 36 06/23/2018 1933   HDL 45 06/23/2018 1933   CHOLHDL 3.4 06/23/2018 1933   VLDL 7 06/23/2018 1933   LDLCALC 103 (H) 06/23/2018 1933    Physical Exam:    VS:  BP 110/60   Pulse 69   Ht 5\' 9"  (1.753 m)   Wt 191 lb (86.6 kg)   SpO2 99%   BMI 28.21 kg/m     Wt Readings from Last 3 Encounters:  12/15/18 191 lb (86.6 kg)  11/30/18 186 lb (84.4 kg)  11/17/18 187 lb 3.2 oz (84.9 kg)     GEN:  Well nourished, well developed in no acute distress HEENT:  Normal NECK: No JVD; No carotid bruits LYMPHATICS: No lymphadenopathy CARDIAC: RRR, no murmurs, rubs, gallops RESPIRATORY:  Clear to auscultation without rales, wheezing or rhonchi  ABDOMEN: Soft, non-tender, non-distended MUSCULOSKELETAL:  No edema; No deformity  SKIN: Warm and dry NEUROLOGIC:  Alert and oriented x 3 PSYCHIATRIC:  Normal affect  ASSESSMENT:    1. Bilateral pulmonary embolism ([place])   2. Syncope, unspecified syncope type   3. Coronary artery disease involving native coronary artery of native heart without angina pectoris   4. DVT, lower extremity, distal, acute, left (HCC)    PLAN:    In order of problems listed above:  Coronary artery status post recent stent placement to proximal LAD with DES on 06/24/2018 in setting of non-ST elevation myocardial infarction - Doing well.  -Plan will be to discontinue his Plavix in April 2021.  That will be 1 year.  He will then be on Eliquis monotherapy.  Bilateral PE, DVT -On Eliquis. indefinite at this point.  Oncology following.    Wide-complex tachycardia -8 beats on monitor. On beta-blocker.  Toprol-XL 12.5 mg once a day for ease of use. Normal EF. He is asymptomatic.  BP was a little bit soft.  No changes.  Syncope -Previously spoke with Dr. Curt Bears. It is certainly plausible that the large burden PE was the cause for his syncope and that this was not arrhythmogenic. OK to resume drivingbeen 6 months.   OK to remove IVC filter. On Eliquis life long. Discussed with daughter.   Skull lesion is regressing. May be resolving infection.  OK to work again at [employer].      Medication Adjustments/Labs and Tests Ordered: Current medicines are reviewed at length with the patient today.  Concerns regarding medicines are outlined above.  No orders of the defined types were placed in this encounter.  No orders of the defined types were placed in this encounter.   Patient Instructions  Medication  Instructions:  The current medical regimen is effective;  continue present plan and medications.  *If you need a refill on your cardiac medications before your next appointment, please call your pharmacy*  Follow-Up: At [facility], you and your health needs are our priority.  As part of our continuing mission to provide you with exceptional heart care, we have created designated Provider Care Teams.  These Care Teams include your primary Cardiologist (physician) and Advanced Practice Providers (APPs -  Physician Assistants and Nurse Practitioners) who all work together to provide you with the care you need, when you need it.  Your next appointment:   6 months  The format for your next appointment:   In Person  Provider:   You may see Candee Furbish, MD or one of the following Advanced Practice Providers on your designated Care Team:    Truitt Merle, NP  Cecilie Kicks, NP  Kathyrn Drown, NP   Thank you for choosing [facility]!!        Signed, Candee Furbish, MD  12/15/2018 1:45 PM    [place]

## 2018-12-15 NOTE — Patient Instructions (Signed)
Medication Instructions:  The current medical regimen is effective;  continue present plan and medications.  *If you need a refill on your cardiac medications before your next appointment, please call your pharmacy*  Follow-Up: At [facility], you and your health needs are our priority.  As part of our continuing mission to provide you with exceptional heart care, we have created designated Provider Care Teams.  These Care Teams include your primary Cardiologist (physician) and Advanced Practice Providers (APPs -  Physician Assistants and Nurse Practitioners) who all work together to provide you with the care you need, when you need it.  Your next appointment:   6 month(s)  The format for your next appointment:   In Person  Provider:   You may see Mark Skains, MD or one of the following Advanced Practice Providers on your designated Care Team:    Lori Gerhardt, NP  Laura Ingold, NP  Jill McDaniel, NP   Thank you for choosing [facility]!!      

## 2018-12-16 ENCOUNTER — Other Ambulatory Visit: Payer: Self-pay | Admitting: Orthopedic Surgery

## 2018-12-20 ENCOUNTER — Other Ambulatory Visit: Payer: Medicare Other

## 2019-01-04 ENCOUNTER — Other Ambulatory Visit (HOSPITAL_COMMUNITY): Payer: Self-pay | Admitting: Diagnostic Radiology

## 2019-01-04 ENCOUNTER — Telehealth (HOSPITAL_COMMUNITY): Payer: Self-pay

## 2019-01-04 ENCOUNTER — Ambulatory Visit
Admission: RE | Admit: 2019-01-04 | Discharge: 2019-01-04 | Disposition: A | Discharge status: Home / Self Care | Payer: Medicare Other | Source: Ambulatory Visit | Attending: Diagnostic Radiology | Admitting: Diagnostic Radiology

## 2019-01-04 ENCOUNTER — Encounter: Payer: Self-pay | Admitting: *Deleted

## 2019-01-04 DIAGNOSIS — Z95828 Presence of other vascular implants and grafts: Secondary | ICD-10-CM

## 2019-01-04 HISTORY — PX: IR RADIOLOGIST EVAL & MGMT: IMG5224

## 2019-01-04 NOTE — Progress Notes (Signed)
Chief Complaint: Patient was seen in consultation today for discussion about IVC filter retrieval  Referring Physician(s): Sherley Bounds  History of Present Illness: Nicholas Webb is a 78 y.o. male with history of skull lesion and mediastinal lymphadenopathy.  Patient was scheduled to undergo a calvarial biopsy and needed pulmonary embolism prophylaxis prior to the procedure.  Patient has a history of pulmonary embolism and on chronic anticoagulation.  IVC filter was placed on [id number] without complication.  Patient had a follow-up brain MRI on 10/10/2018 that suggested the calvarial lesion was regressing.  Therefore, the patient did not undergo a calvarial biopsy.  Patient has not stopped his Eliquis.  Patient has no complaints today.  Past Medical History:  Diagnosis Date  . Anemia of chronic disease   . Atherosclerosis of both carotid arteries   . Barrett's esophagus   . Dizziness   . GERD (gastroesophageal reflux disease)   . Hx of adenomatous colonic polyps   . Hyperlipidemia   . Macrocytosis without anemia   . Obesity   . OSA (obstructive sleep apnea)   . Rheumatoid arthritis(714.0)   . Syncope     Past Surgical History:  Procedure Laterality Date  . CORONARY STENT INTERVENTION N/A 06/24/2018   Procedure: CORONARY STENT INTERVENTION;  Surgeon: Burnell Blanks, MD;  Location: [facility];  Service: Cardiovascular;  Laterality: N/A;  . IR IVC FILTER PLMT / S&I Burke Keels GUID/MOD SED  10/07/2018  . IR RADIOLOGIST EVAL & MGMT  09/27/2018  . IR RADIOLOGIST EVAL & MGMT  01/04/2019  . JOINT REPLACEMENT  2004   left  . LEFT HEART CATH AND CORONARY ANGIOGRAPHY N/A 06/24/2018   Procedure: LEFT HEART CATH AND CORONARY ANGIOGRAPHY;  Surgeon: Burnell Blanks, MD;  Location: [facility];  Service: Cardiovascular;  Laterality: N/A;    Allergies: Patient has no known allergies.  Medications: Prior to Admission medications   Medication Sig Start Date End  Date Taking? Authorizing Provider  Adalimumab (HUMIRA) 40 MG/0.8ML PSKT Inject 1 each into the skin See admin instructions. Every 2 weeks    [provider]  apixaban (ELIQUIS) 5 MG TABS tablet TAKE 1 TABLET(5 MG) BY MOUTH TWICE DAILY 12/08/18   Ladell Pier, MD  atorvastatin (LIPITOR) 80 MG tablet Take 1 tablet (80 mg total) by mouth daily at 6 PM. 07/14/18   Jerline Pain, MD  clopidogrel (PLAVIX) 75 MG tablet Take 1 tablet (75 mg total) by mouth daily. Take (2) tablets the first day then (1) tablet daily thereafter 07/14/18   Jerline Pain, MD  esomeprazole (NEXIUM) 20 MG capsule Take 20 mg by mouth daily at 12 noon.     [provider]  fluorouracil (EFUDEX) 5 % cream Apply topically daily. To scalp 10/27/18   [provider]  folic acid (FOLVITE) 1 MG tablet Take 2 mg by mouth daily.     [provider]  methotrexate (RHEUMATREX) 2.5 MG tablet Take 2.5 mg by mouth 3 (three) times daily. 12/08/18   [provider]  Metoprolol Succinate 25 MG CS24 Take 12.5 mg by mouth daily. 09/15/18   Jerline Pain, MD  Multiple Vitamins-Minerals (MULTIVITAMIN WITH MINERALS) tablet Take 1 tablet by mouth daily.    [provider]     Family History  Problem Relation Age of Onset  . Stroke Mother   . Glaucoma Mother   . Cancer Father   . Prostate cancer Father   . Colon cancer  Father     Social History   Socioeconomic History  . Marital status: Married    Spouse name: Nicholas Webb  . Number of children: 2  . Years of education: Not on file  . Highest education level: Bachelor's degree (e.g., BA, AB, BS)  Occupational History    Employer: [employer]  Social Needs  . Financial resource strain: Not on file  . Food insecurity    Worry: Not on file    Inability: Not on file  . Transportation needs    Medical: Not on file    Non-medical: Not on file  Tobacco Use  . Smoking status: Never Smoker  . Smokeless tobacco: Never Used   Substance and Sexual Activity  . Alcohol use: Yes    Alcohol/week: 1.0 standard drinks    Types: 1 Glasses of wine per week    Comment: OCCASION  . Drug use: No  . Sexual activity: Not on file  Lifestyle  . Physical activity    Days per week: Not on file    Minutes per session: Not on file  . Stress: Not on file  Relationships  . Social Herbalist on phone: Not on file    Gets together: Not on file    Attends religious service: Not on file    Active member of club or organization: Not on file    Attends meetings of clubs or organizations: Not on file    Relationship status: Not on file  Other Topics Concern  . Not on file  Social History Narrative   Patient is right-handed.  He lives with his wife in a one level home. He does not exercise.      Review of Systems  Constitutional: Negative.     Vital Signs: BP 126/65 (BP Location: Right Arm)   Pulse 79   SpO2 99%   Physical Exam Constitutional:      Appearance: Normal appearance. He is not ill-appearing.  Neurological:     Mental Status: He is alert.    Imaging: US Venous Img Lower Bilateral  Result Date: 01/04/2019 CLINICAL DATA:  78 year old with history of pulmonary embolism and was scheduled for a neurosurgical procedure. IVC filter was placed prior to the neurosurgery procedure. However, neurosurgery procedure was not performed due to regression of the calvarial lesion based on MRI. Patient is being evaluated for IVC filter retrieval. EXAM: BILATERAL LOWER EXTREMITY VENOUS DOPPLER ULTRASOUND TECHNIQUE: Gray-scale sonography with graded compression, as well as color Doppler and duplex ultrasound were performed to evaluate the lower extremity deep venous systems from the level of the common femoral vein and including the common femoral, femoral, profunda femoral, popliteal and calf veins including the posterior tibial, peroneal and gastrocnemius veins when visible. The superficial great saphenous vein was  also interrogated. Spectral Doppler was utilized to evaluate flow at rest and with distal augmentation maneuvers in the common femoral, femoral and popliteal veins. COMPARISON:  12/15/2016 FINDINGS: RIGHT LOWER EXTREMITY Common Femoral Vein: No evidence of thrombus. Normal compressibility, respiratory phasicity and response to augmentation. Saphenofemoral Junction: No evidence of thrombus. Normal compressibility and flow on color Doppler imaging. Profunda Femoral Vein: No evidence of thrombus. Normal compressibility and flow on color Doppler imaging. Femoral Vein: No evidence of thrombus. Normal compressibility, respiratory phasicity and response to augmentation. Popliteal Vein: No evidence of thrombus. Normal compressibility, respiratory phasicity and response to augmentation. Calf Veins: No evidence of thrombus. Normal compressibility and flow on color Doppler imaging. Superficial Great Saphenous  Vein: Visualized right deep calf veins are patent without thrombus. Venous Reflux:  None. Other Findings:  None. LEFT LOWER EXTREMITY Common Femoral Vein: No evidence of thrombus. Normal compressibility, respiratory phasicity and response to augmentation. Saphenofemoral Junction: No evidence of thrombus. Normal compressibility and flow on color Doppler imaging. Profunda Femoral Vein: No evidence of thrombus. Normal compressibility and flow on color Doppler imaging. Femoral Vein: No evidence of thrombus. Normal compressibility, respiratory phasicity and response to augmentation. Popliteal Vein: No evidence of thrombus. Normal compressibility, respiratory phasicity and response to augmentation. Calf Veins: Visualized left deep calf veins are patent without thrombus. Superficial Great Saphenous Vein: No evidence of thrombus. Normal compressibility. Venous Reflux:  None. Other Findings:  None. IMPRESSION: No evidence of deep venous thrombosis in either lower extremity. Electronically Signed   By: Markus Daft M.D.   On:  01/04/2019 10:44   Ir Radiologist Eval & Mgmt  Result Date: 01/04/2019 Please refer to notes tab for details about interventional procedure. (Op Note)   Labs:  CBC: Recent Labs    06/27/18 0432 06/27/18 0853 06/28/18 0341 10/07/18 0819  WBC 10.3 8.7 10.5 6.8  HGB 12.6* 12.9* 12.8* 12.8*  HCT 38.2* 39.0 38.4* 40.2  PLT 165 172 181 146*    COAGS: Recent Labs    06/26/18 0453 10/07/18 0819  INR 1.2 1.2    BMP: Recent Labs    06/23/18 1701 06/24/18 0234 06/25/18 0403 10/07/18 0819  NA 140 140 139 140  K 4.2 4.0 3.9 4.1  CL 103 107 107 104  CO2 22 23 24 27   GLUCOSE 135* 111* 106* 99  BUN 22 25* 14 21  CALCIUM 9.5 8.9 8.9 9.2  CREATININE 1.04 1.00 0.85 1.01  GFRNONAA >60 >60 >60 >60  GFRAA >60 >60 >60 >60    LIVER FUNCTION TESTS: Recent Labs    06/24/18 0234  BILITOT 0.8  AST 40  ALT 49*  ALKPHOS 67  PROT 6.4*  ALBUMIN 3.2*    TUMOR MARKERS: No results for input(s): AFPTM, CEA, CA199, CHROMGRNA in the last 8760 hours.  Assessment and Plan:  78 year old with history of pulmonary embolism and requires chronic anticoagulation.  IVC filter was placed in anticipation of a calvarial biopsy.  Fortunately, the calvarial lesion is regressing and patient did not require a calvarial biopsy.  At this point, the patient does not need the IVC filter because he does not have a scheduled surgical procedure and he is still on his Eliquis.  We reviewed his IVC filter placement images and discussed the IVC filter retrieval procedure in depth.  Risk of the procedure include bleeding, infection, vascular injury and unsuccessful retrieval.  Patient has a very good understanding of the procedure.  Patient is scheduled to follow-up with neurosurgery at the beginning of December and would like to wait until after that appointment to ensure there are no plans for future biopsy or neurosurgical procedure.  Lower extremity venous duplex is negative for DVT at today's visit.  Assuming  that nothing changes with regards to the calvarial lesion, we will anticipate scheduling the patient for an IVC filter retrieval in December following his neurosurgery appointment.    Electronically Signed: Burman Riis 01/04/2019, 10:49 AM   I spent a total of    10 Minutes in face to face in clinical consultation, greater than 50% of which was counseling/coordinating care for IVC filter management

## 2019-01-04 NOTE — Telephone Encounter (Signed)
Called pt regarding ivc filter retrieval. Pt would like me to call him back on 02/04/19 after he sees neuro to schedule this procedure. AW

## 2019-01-13 ENCOUNTER — Other Ambulatory Visit: Payer: Self-pay | Admitting: Neurological Surgery

## 2019-01-13 DIAGNOSIS — M898X8 Other specified disorders of bone, other site: Secondary | ICD-10-CM

## 2019-01-17 ENCOUNTER — Encounter: Payer: Self-pay | Admitting: Podiatry

## 2019-01-17 ENCOUNTER — Ambulatory Visit: Payer: Medicare Other | Admitting: Podiatry

## 2019-01-17 ENCOUNTER — Other Ambulatory Visit: Payer: Self-pay

## 2019-01-17 DIAGNOSIS — D689 Coagulation defect, unspecified: Secondary | ICD-10-CM | POA: Diagnosis not present

## 2019-01-17 DIAGNOSIS — M79676 Pain in unspecified toe(s): Secondary | ICD-10-CM | POA: Diagnosis not present

## 2019-01-17 DIAGNOSIS — B351 Tinea unguium: Secondary | ICD-10-CM

## 2019-01-17 NOTE — Progress Notes (Signed)
Complaint:  Visit Type: Patient returns to my office for continued preventative foot care services. Complaint: Patient states" my nails have grown long and thick and become painful to walk and wear shoes" Patient has been diagnosed with DM with no foot complications. The patient presents for preventative foot care services. Patient is taking eliquiss and plavix.  Podiatric Exam: Vascular: dorsalis pedis and posterior tibial pulses are palpable bilateral. Capillary return is immediate. Temperature gradient is WNL. Skin turgor WNL  Sensorium: Normal Semmes Weinstein monofilament test. Normal tactile sensation bilaterally. Nail Exam: Pt has thick disfigured discolored nails with subungual debris noted bilateral entire nail hallux through fifth toenails Ulcer Exam: There is no evidence of ulcer or pre-ulcerative changes or infection. Orthopedic Exam: Muscle tone and strength are WNL. No limitations in general ROM. No crepitus or effusions noted. Foot type and digits show no abnormalities. Bony prominences are unremarkable. Skin: No Porokeratosis. No infection or ulcers  Diagnosis:  Onychomycosis, , Pain in right toe, pain in left toes  Treatment & Plan Procedures and Treatment: Consent by patient was obtained for treatment procedures.   Debridement of mycotic and hypertrophic toenails, 1 through 5 bilateral and clearing of subungual debris. No ulceration, no infection noted.  Return Visit-Office Procedure: Patient instructed to return to the office for a follow up visit 3 months for continued evaluation and treatment.    Gardiner Barefoot DPM

## 2019-01-31 ENCOUNTER — Ambulatory Visit
Admission: RE | Admit: 2019-01-31 | Discharge: 2019-01-31 | Disposition: A | Discharge status: Home / Self Care | Payer: Medicare Other | Source: Ambulatory Visit | Attending: Neurological Surgery | Admitting: Neurological Surgery

## 2019-01-31 DIAGNOSIS — M898X8 Other specified disorders of bone, other site: Secondary | ICD-10-CM

## 2019-02-02 ENCOUNTER — Other Ambulatory Visit: Payer: Self-pay | Admitting: Oncology

## 2019-02-06 ENCOUNTER — Telehealth (HOSPITAL_COMMUNITY): Payer: Self-pay

## 2019-02-06 NOTE — Telephone Encounter (Signed)
Called to schedule ivc filter retrieval. Pt would like to wait until after Christmas. Will call back the week of 12/28 for an update. AW

## 2019-02-13 ENCOUNTER — Ambulatory Visit: Payer: Medicare Other | Admitting: Oncology

## 2019-02-16 ENCOUNTER — Inpatient Hospital Stay: Payer: Medicare Other | Attending: Oncology | Admitting: Oncology

## 2019-02-16 ENCOUNTER — Telehealth: Payer: Self-pay | Admitting: *Deleted

## 2019-02-16 DIAGNOSIS — I2699 Other pulmonary embolism without acute cor pulmonale: Secondary | ICD-10-CM | POA: Diagnosis not present

## 2019-02-16 NOTE — Telephone Encounter (Signed)
Telephone call to patient's daughter Mickel Baas as requested by patient. Discussed telephone visit. Daughter understands a follow up will be scheduled in 4 months. Patient to remain on anticoagulation indefinitely.

## 2019-02-16 NOTE — Progress Notes (Signed)
Nicholas Webb OFFICE VISIT PROGRESS NOTE  I connected with Gerarda Gunther on 02/16/19 at  3:30 PM EST by telephone and verified that I am speaking with the correct person using two identifiers.   I discussed the limitations, risks, security and privacy concerns of performing an evaluation and management service by telemedicine and the availability of in-person appointments. I also discussed with the patient that there may be a patient responsible charge related to this service. The patient expressed understanding and agreed to proceed.    Patient's location: Car Provider's location: Office   Diagnosis: Skull mass, pulmonary embolism  INTERVAL HISTORY:   Mr. Cucinella is seen for a telehealth visit.  This is secondary to the Covid pandemic. He reports feeling well.  He has experienced pain in the neck for the past day.  He relates this to working in the yard with a blower. He continues apixaban anticoagulation.  No bleeding or symptoms of thrombosis. A follow-up head CT on 01/31/2019 revealed further decrease in the size of a lucent lesion at the posterior parietal calvarium.  No new osseous abnormality.  He saw Dr. Ronnald Ramp for a follow-up visit after the CT. Objective:  Medications: I have reviewed the patient's current medications.  Assessment/Plan: 1.Skull mass, chest lymphadenopathy  MRI brain 06/24/2018-posterior vertex infiltrated and poorly marginated mass with extending into the underlying pachymeningeal's and into the scalp, the mass invades and narrows the superior sagittal sinus  CTs of the chest, abdomen, and pelvis on 06/25/2018-acute bilateral pulmonary emboli with a large clot burden, necrotic mediastinal adenopathy, 8 mm pleural-based left lower lobe nodule, 4 mm left upper lobe nodules, chronic pulmonary fibrosis  PET scan 07/06/2018- no evidence of a primary malignancy, small pulmonary nodules are not metabolically active,  mildly enlarged/mildly metabolic mediastinal nodes favored to be reactive  MRI brain 10/10/2018-progression of calvarial lesion at the vertex, stable presumed vestibular schwannoma internal auditory canal  CT head 10/12/2018- filling in/sclerosis of the parietal vertex lesion with reduction adjacent soft tissue density consistent with healing  CT head 01/31/2019-further decrease in size of the parietal skull lesion, no new abnormality 2. 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 Servellon was diagnosed with an unprovoked DVT/pulmonary embolism in April of this year.  He continues apixaban anticoagulation.  I recommend indefinite anticoagulation therapy.  He takes Plavix after a coronary stent was placed in ankle.  I will contact cardiology for a recommendation regarding continuation of Plavix.  The skull mass has regressed.  There is no clinical evidence of a malignancy.  He will return for an office visit in 4 months.   I discussed the assessment and treatment plan with the patient. The patient was provided an opportunity to ask questions and all were answered. The patient agreed with the plan and demonstrated an understanding of the instructions.   The patient was advised to call back or seek an in-person evaluation if the symptoms worsen or if the condition fails to improve as anticipated.  I  provided 22 minutes of telephone,  chart review, and documentation time during this encounter, and > 50% was spent counseling as documented under my assessment & plan.  [name] ANP/GNP-BC   02/16/2019 3:32 PM

## 2019-02-17 ENCOUNTER — Telehealth: Payer: Self-pay | Admitting: Oncology

## 2019-02-17 ENCOUNTER — Telehealth: Payer: Self-pay | Admitting: *Deleted

## 2019-02-17 NOTE — Telephone Encounter (Signed)
Scheduled per los. Called and spoke with patient. Confirmed appt 

## 2019-02-17 NOTE — Telephone Encounter (Signed)
Informed patient that Dr. Benay Spice spoke w/Dr. Marlou Porch last night and cardiology wants him to continue Plavix w/plan to discontinue in April 2021.

## 2019-03-02 ENCOUNTER — Encounter (HOSPITAL_COMMUNITY): Payer: Self-pay

## 2019-03-02 NOTE — Progress Notes (Signed)
Nicholas Webb has completed the virtual cardiac rehab program and will continue exercise independently at this time. Patient's virtual cardiac rehab report will be scanned to media for review.  Deitra Mayo BS, ACSM CEP 03/02/2019 1:59 PM

## 2019-03-06 ENCOUNTER — Telehealth (HOSPITAL_COMMUNITY): Payer: Self-pay

## 2019-03-06 NOTE — Telephone Encounter (Signed)
Called to schedule ivc filter retrieval. Pt would like to wait 2 more months. AW

## 2019-03-23 ENCOUNTER — Ambulatory Visit: Payer: Medicare PPO | Attending: Internal Medicine

## 2019-03-23 DIAGNOSIS — Z23 Encounter for immunization: Secondary | ICD-10-CM

## 2019-03-23 NOTE — Progress Notes (Signed)
   Covid-19 Vaccination Clinic  Name:  Nicholas Webb    MRN: [id number] DOB: 1940/06/12  03/23/2019  [name] was observed post Covid-19 immunization for 15 minutes without incidence. He was provided with Vaccine Information Sheet and instruction to access the V-Safe system.   [name] was instructed to call 911 with any severe reactions post vaccine: Marland Kitchen Difficulty breathing  . Swelling of your face and throat  . A fast heartbeat  . A bad rash all over your body  . Dizziness and weakness    Immunizations Administered    Name Date Dose VIS Date Route   Pfizer COVID-19 Vaccine 03/23/2019  9:19 AM 0.3 mL 02/10/2019 Intramuscular   Manufacturer: [place]   Lot: [id number]   [place]: [id number]

## 2019-03-30 ENCOUNTER — Telehealth (HOSPITAL_COMMUNITY): Payer: Self-pay | Admitting: *Deleted

## 2019-03-30 ENCOUNTER — Other Ambulatory Visit: Payer: Self-pay

## 2019-03-30 ENCOUNTER — Encounter (HOSPITAL_COMMUNITY)
Admission: RE | Admit: 2019-03-30 | Discharge: 2019-03-30 | Disposition: A | Discharge status: Home / Self Care | Payer: Medicare PPO | Source: Ambulatory Visit | Attending: Cardiology | Admitting: Cardiology

## 2019-03-30 NOTE — Telephone Encounter (Signed)
-----   Message from Ladell Pier, MD sent at 03/30/2019  1:33 PM EST ----- Regarding: RE: [facility] for participation in group setting for cardiac rehab no ----- Message ----- From: Rowe Pavy, RN Sent: 03/30/2019  12:55 PM EST To: Ladell Pier, MD Subject: [facility] for participation in group setting for ca#   Dr. Benay Spice,  The above mutual patient expressed interest in coming onsite for group exercise at Cardiac Rehab after his cardiac event in April 2020. Reviewed latest office notes and certainly this is good news.  Note that patients must wear a mask and they are sectioned off into pods separated by  tall plexiglas barriers.  Each pt is screen and temperature are checked prior to entering the gym. Pt Covid Risk Score - 5 From an oncologist perspective, any concerns with this patient exercising in a group setting?  Thanks for your advisement Maurice Small RN, BSN Cardiac and Pulmonary Rehab Nurse Navigator

## 2019-03-30 NOTE — Telephone Encounter (Signed)
Spoke to Nicholas Webb he has been using the virtual cardiac rehab infrequently. [name] is doing some walking at home  and would like to come to in person phase 2 cardiac rehab. Will take [name] off the virtual better hearts APP and contact the patient in the near future about participation.Barnet Pall, RN,BSN 03/30/2019 11:09 AM

## 2019-04-12 ENCOUNTER — Telehealth (HOSPITAL_COMMUNITY): Payer: Self-pay

## 2019-04-12 NOTE — Telephone Encounter (Signed)
Pt insurance is active and benefits verified through [employer] $20, DED 0/0 met, out of pocket $3,300/$70 met, co-insurance 0%. no pre-authorization required. Passport, 04/12/2019_0 :34pm, REF# [id number]  Will contact patient to see if he is interested in the Cardiac Rehab Program. If interested, patient will need to complete follow up appt. Once completed, patient will be contacted for scheduling upon review by the RN Navigator.

## 2019-04-13 ENCOUNTER — Ambulatory Visit: Payer: Medicare PPO | Attending: Internal Medicine

## 2019-04-13 DIAGNOSIS — Z23 Encounter for immunization: Secondary | ICD-10-CM

## 2019-04-13 NOTE — Progress Notes (Signed)
   Covid-19 Vaccination Clinic  Name:  ELIGE STOVES    MRN: [id number] DOB: Mar 20, 1940  04/13/2019  Mr. Llerenas was observed post Covid-19 immunization for 15 minutes without incidence. He was provided with Vaccine Information Sheet and instruction to access the V-Safe system.   Mr. Zesati was instructed to call 911 with any severe reactions post vaccine: Marland Kitchen Difficulty breathing  . Swelling of your face and throat  . A fast heartbeat  . A bad rash all over your body  . Dizziness and weakness    Immunizations Administered    Name Date Dose VIS Date Route   Pfizer COVID-19 Vaccine 04/13/2019  9:23 AM 0.3 mL 02/10/2019 Intramuscular   Manufacturer: [place]   Lot: [id number]   [place]: [id number]

## 2019-04-18 ENCOUNTER — Encounter (HOSPITAL_COMMUNITY)
Admission: RE | Admit: 2019-04-18 | Discharge: 2019-04-18 | Disposition: A | Discharge status: Home / Self Care | Payer: Medicare PPO | Source: Ambulatory Visit | Attending: Cardiology | Admitting: Cardiology

## 2019-04-18 ENCOUNTER — Other Ambulatory Visit: Payer: Self-pay

## 2019-04-18 ENCOUNTER — Encounter (HOSPITAL_COMMUNITY): Payer: Self-pay

## 2019-04-18 VITALS — BP 100/58 | HR 92 | Temp 96.8°F | Ht 67.0 in | Wt 189.4 lb

## 2019-04-18 DIAGNOSIS — I214 Non-ST elevation (NSTEMI) myocardial infarction: Secondary | ICD-10-CM | POA: Insufficient documentation

## 2019-04-18 DIAGNOSIS — Z955 Presence of coronary angioplasty implant and graft: Secondary | ICD-10-CM | POA: Insufficient documentation

## 2019-04-18 NOTE — Progress Notes (Signed)
Cardiac Individual Treatment Plan  Patient Details  Name: Nicholas Webb MRN: [id number] Date of Birth: 1940/12/14 Referring Provider:     CARDIAC REHAB PHASE II ORIENTATION from 04/18/2019 in [place]  Referring Provider  Dr. Marlou Porch      Initial Encounter Date:    CARDIAC REHAB PHASE II ORIENTATION from 04/18/2019 in [place]  Date  04/18/19      Visit Diagnosis: NSTEMI (non-ST elevated myocardial infarction) [facility])  Status post coronary artery stent placement  Patient's Home Medications on Admission:  Current Outpatient Medications:  .  atorvastatin (LIPITOR) 80 MG tablet, Take 1 tablet (80 mg total) by mouth daily at 6 PM., Disp: 90 tablet, Rfl: 3 .  clopidogrel (PLAVIX) 75 MG tablet, Take 1 tablet (75 mg total) by mouth daily. Take (2) tablets the first day then (1) tablet daily thereafter, Disp: 90 tablet, Rfl: 3 .  ELIQUIS 5 MG TABS tablet, TAKE 1 TABLET(5 MG) BY MOUTH TWICE DAILY, Disp: 60 tablet, Rfl: 2 .  esomeprazole (NEXIUM) 20 MG capsule, Take 20 mg by mouth daily at 12 noon. , Disp: , Rfl:  .  fluorouracil (EFUDEX) 5 % cream, Apply topically daily. To scalp, Disp: , Rfl:  .  folic acid (FOLVITE) 1 MG tablet, Take 2 mg by mouth daily. , Disp: , Rfl:  .  methotrexate (RHEUMATREX) 2.5 MG tablet, Take 2.5 mg by mouth 3 (three) times daily., Disp: , Rfl:  .  Metoprolol Succinate 25 MG CS24, Take 12.5 mg by mouth daily., Disp: 45 capsule, Rfl: 3 .  Multiple Vitamins-Minerals (MULTIVITAMIN WITH MINERALS) tablet, Take 1 tablet by mouth daily., Disp: , Rfl:  .  Adalimumab (HUMIRA) 40 MG/0.8ML PSKT, Inject 1 each into the skin See admin instructions. Every 2 weeks, Disp: , Rfl:   Past Medical History: Past Medical History:  Diagnosis Date  . Anemia of chronic disease   . Atherosclerosis of both carotid arteries   . Barrett's esophagus   . Dizziness   . GERD (gastroesophageal reflux disease)   . Hx of  adenomatous colonic polyps   . Hyperlipidemia   . Macrocytosis without anemia   . Obesity   . OSA (obstructive sleep apnea)   . Rheumatoid arthritis(714.0)   . Syncope     Tobacco Use: Social History   Tobacco Use  Smoking Status Never Smoker  Smokeless Tobacco Never Used    Labs: Recent Review Scientist, physiological    Labs for ITP Cardiac and Pulmonary Rehab Latest Ref Rng & Units 06/23/2018   Cholestrol 0 - 200 mg/dL 155   LDLCALC 0 - 99 mg/dL 103(H)   HDL >40 mg/dL 45   Trlycerides <150 mg/dL 36   Hemoglobin A1c 4.8 - 5.6 % 5.9(H)      Capillary Blood Glucose: Lab Results  Component Value Date   GLUCAP 81 07/06/2018     Exercise Target Goals: Exercise Program Goal: Individual exercise prescription set using results from initial 6 min walk test and THRR while considering  patient's activity barriers and safety.   Exercise Prescription Goal: Initial exercise prescription builds to 30-45 minutes a day of aerobic activity, 2-3 days per week.  Home exercise guidelines will be given to patient during program as part of exercise prescription that the participant will acknowledge.  Activity Barriers & Risk Stratification: Activity Barriers & Cardiac Risk Stratification - 04/18/19 1020      Activity Barriers & Cardiac Risk Stratification   Activity Barriers  Arthritis;Deconditioning;Balance Concerns    Cardiac Risk Stratification  High       6 Minute Walk: 6 Minute Walk    Row Name 04/18/19 1019         6 Minute Walk   Phase  Initial     Distance  1324 feet     Walk Time  6 minutes     # of Rest Breaks  0     MPH  2.5     METS  2.6     RPE  13     Perceived Dyspnea   0     VO2 Peak  9.29     Symptoms  No     Resting HR  92 bpm     Resting BP  100/58     Resting Oxygen Saturation   96 %     Exercise Oxygen Saturation  during 6 min walk  95 %     Max Ex. HR  120 bpm     Max Ex. BP  134/68     2 Minute Post BP  106/60        Oxygen Initial  Assessment:   Oxygen Re-Evaluation:   Oxygen Discharge (Final Oxygen Re-Evaluation):   Initial Exercise Prescription: Initial Exercise Prescription - 04/18/19 1000      Date of Initial Exercise RX and Referring Provider   Date  04/18/19    Referring Provider  Dr. Marlou Porch    Expected Discharge Date  06/16/19      Recumbant Bike   Level  2    Watts  20    Minutes  15    METs  2.74      NuStep   Level  2    SPM  85    Minutes  15    METs  2.6      Prescription Details   Frequency (times per week)  3    Duration  Progress to 30 minutes of continuous aerobic without signs/symptoms of physical distress      Intensity   THRR 40-80% of Max Heartrate  57-114    Ratings of Perceived Exertion  11-13    Perceived Dyspnea  0-4      Progression   Progression  Continue to progress workloads to maintain intensity without signs/symptoms of physical distress.      Resistance Training   Training Prescription  Yes    Weight  3 lbs.     Reps  10-15       Perform Capillary Blood Glucose checks as needed.  Exercise Prescription Changes:   Exercise Comments:   Exercise Goals and Review:  Exercise Goals    Row Name 04/18/19 1024             Exercise Goals   Increase Physical Activity  Yes       Intervention  Provide advice, education, support and counseling about physical activity/exercise needs.;Develop an individualized exercise prescription for aerobic and resistive training based on initial evaluation findings, risk stratification, comorbidities and participant's personal goals.       Expected Outcomes  Short Term: Attend rehab on a regular basis to increase amount of physical activity.;Long Term: Add in home exercise to make exercise part of routine and to increase amount of physical activity.;Long Term: Exercising regularly at least 3-5 days a week.       Increase Strength and Stamina  Yes       Intervention  Provide advice, education, support and counseling  about  physical activity/exercise needs.;Develop an individualized exercise prescription for aerobic and resistive training based on initial evaluation findings, risk stratification, comorbidities and participant's personal goals.       Expected Outcomes  Short Term: Increase workloads from initial exercise prescription for resistance, speed, and METs.;Long Term: Improve cardiorespiratory fitness, muscular endurance and strength as measured by increased METs and functional capacity (6MWT);Short Term: Perform resistance training exercises routinely during rehab and add in resistance training at home       Able to understand and use rate of perceived exertion (RPE) scale  Yes       Intervention  Provide education and explanation on how to use RPE scale       Expected Outcomes  Short Term: Able to use RPE daily in rehab to express subjective intensity level;Long Term:  Able to use RPE to guide intensity level when exercising independently       Knowledge and understanding of Target Heart Rate Range (THRR)  Yes       Intervention  Provide education and explanation of THRR including how the numbers were predicted and where they are located for reference       Expected Outcomes  Long Term: Able to use THRR to govern intensity when exercising independently;Short Term: Able to state/look up THRR;Short Term: Able to use daily as guideline for intensity in rehab       Able to check pulse independently  Yes       Intervention  Provide education and demonstration on how to check pulse in carotid and radial arteries.;Review the importance of being able to check your own pulse for safety during independent exercise       Expected Outcomes  Short Term: Able to explain why pulse checking is important during independent exercise;Long Term: Able to check pulse independently and accurately       Understanding of Exercise Prescription  Yes       Intervention  Provide education, explanation, and written materials on patient's  individual exercise prescription       Expected Outcomes  Short Term: Able to explain program exercise prescription;Long Term: Able to explain home exercise prescription to exercise independently          Exercise Goals Re-Evaluation :   Discharge Exercise Prescription (Final Exercise Prescription Changes):   Nutrition:  Target Goals: Understanding of nutrition guidelines, daily intake of sodium 1500mg , cholesterol 200mg , calories 30% from fat and 7% or less from saturated fats, daily to have 5 or more servings of fruits and vegetables.  Biometrics: Pre Biometrics - 04/18/19 1025      Pre Biometrics   Height  5\' 7"  (1.702 m)    Weight  85.9 kg    Waist Circumference  42.25 inches    Hip Circumference  42 inches    Waist to Hip Ratio  1.01 %    BMI (Calculated)  29.65    Triceps Skinfold  21 mm    % Body Fat  31.2 %    Grip Strength  34 kg    Flexibility  11.25 in    Single Leg Stand  0 seconds        Nutrition Therapy Plan and Nutrition Goals:   Nutrition Assessments:   Nutrition Goals Re-Evaluation:   Nutrition Goals Re-Evaluation:   Nutrition Goals Discharge (Final Nutrition Goals Re-Evaluation):   Psychosocial: Target Goals: Acknowledge presence or absence of significant depression and/or stress, maximize coping skills, provide positive support system. Participant is able to verbalize  types and ability to use techniques and skills needed for reducing stress and depression.  Initial Review & Psychosocial Screening: Initial Psych Review & Screening - 04/18/19 1057      Initial Review   Current issues with  None Identified      Family Dynamics   Good Support System?  Yes    Comments  Mr. Gotz maintains a positive attitude and outlook. He relies on his daughter who is in the healthcare field and his wife for support. He enjoys woodworking and utilizes this for stress reduction if needed.      Barriers   Psychosocial barriers to participate in program   There are no identifiable barriers or psychosocial needs.      Screening Interventions   Interventions  Encouraged to exercise       Quality of Life Scores: Quality of Life - 04/18/19 1030      Quality of Life   Select  Quality of Life      Quality of Life Scores   Health/Function Pre  26.67 %    Socioeconomic Pre  28 %    Psych/Spiritual Pre  29.14 %    Family Pre  30 %    GLOBAL Pre  27.85 %      Scores of 19 and below usually indicate a poorer quality of life in these areas.  A difference of  2-3 points is a clinically meaningful difference.  A difference of 2-3 points in the total score of the Quality of Life Index has been associated with significant improvement in overall quality of life, self-image, physical symptoms, and general health in studies assessing change in quality of life.  PHQ-9: Recent Review Flowsheet Data    Depression screen [facility] 2/9 04/18/2019   Decreased Interest 0   Down, Depressed, Hopeless 0   PHQ - 2 Score 0     Interpretation of Total Score  Total Score Depression Severity:  1-4 = Minimal depression, 5-9 = Mild depression, 10-14 = Moderate depression, 15-19 = Moderately severe depression, 20-27 = Severe depression   Psychosocial Evaluation and Intervention:   Psychosocial Re-Evaluation:   Psychosocial Discharge (Final Psychosocial Re-Evaluation):   Vocational Rehabilitation: Provide vocational rehab assistance to qualifying candidates.   Vocational Rehab Evaluation & Intervention: Vocational Rehab - 04/18/19 1057      Initial Vocational Rehab Evaluation & Intervention   Assessment shows need for Vocational Rehabilitation  No       Education: Education Goals: Education classes will be provided on a weekly basis, covering required topics. Participant will state understanding/return demonstration of topics presented.  Learning Barriers/Preferences: Learning Barriers/Preferences - 04/18/19 1057      Learning Barriers/Preferences    Learning Barriers  None       Education Topics: Count Your Pulse:  -Group instruction provided by verbal instruction, demonstration, patient participation and written materials to support subject.  Instructors address importance of being able to find your pulse and how to count your pulse when at home without a heart monitor.  Patients get hands on experience counting their pulse with staff help and individually.   Heart Attack, Angina, and Risk Factor Modification:  -Group instruction provided by verbal instruction, video, and written materials to support subject.  Instructors address signs and symptoms of angina and heart attacks.    Also discuss risk factors for heart disease and how to make changes to improve heart health risk factors.   Functional Fitness:  -Group instruction provided by verbal instruction, demonstration, patient participation,  and written materials to support subject.  Instructors address safety measures for doing things around the house.  Discuss how to get up and down off the floor, how to pick things up properly, how to safely get out of a chair without assistance, and balance training.   Meditation and Mindfulness:  -Group instruction provided by verbal instruction, patient participation, and written materials to support subject.  Instructor addresses importance of mindfulness and meditation practice to help reduce stress and improve awareness.  Instructor also leads participants through a meditation exercise.    Stretching for Flexibility and Mobility:  -Group instruction provided by verbal instruction, patient participation, and written materials to support subject.  Instructors lead participants through series of stretches that are designed to increase flexibility thus improving mobility.  These stretches are additional exercise for major muscle groups that are typically performed during regular warm up and cool down.   Hands Only CPR:  -Group verbal, video,  and participation provides a basic overview of AHA guidelines for community CPR. Role-play of emergencies allow participants the opportunity to practice calling for help and chest compression technique with discussion of AED use.   Hypertension: -Group verbal and written instruction that provides a basic overview of hypertension including the most recent diagnostic guidelines, risk factor reduction with self-care instructions and medication management.    Nutrition I class: Heart Healthy Eating:  -Group instruction provided by PowerPoint slides, verbal discussion, and written materials to support subject matter. The instructor gives an explanation and review of the Therapeutic Lifestyle Changes diet recommendations, which includes a discussion on lipid goals, dietary fat, sodium, fiber, plant stanol/sterol esters, sugar, and the components of a well-balanced, healthy diet.   Nutrition II class: Lifestyle Skills:  -Group instruction provided by PowerPoint slides, verbal discussion, and written materials to support subject matter. The instructor gives an explanation and review of label reading, grocery shopping for heart health, heart healthy recipe modifications, and ways to make healthier choices when eating out.   Diabetes Question & Answer:  -Group instruction provided by PowerPoint slides, verbal discussion, and written materials to support subject matter. The instructor gives an explanation and review of diabetes co-morbidities, pre- and post-prandial blood glucose goals, pre-exercise blood glucose goals, signs, symptoms, and treatment of hypoglycemia and hyperglycemia, and foot care basics.   Diabetes Blitz:  -Group instruction provided by PowerPoint slides, verbal discussion, and written materials to support subject matter. The instructor gives an explanation and review of the physiology behind type 1 and type 2 diabetes, diabetes medications and rational behind using different  medications, pre- and post-prandial blood glucose recommendations and Hemoglobin A1c goals, diabetes diet, and exercise including blood glucose guidelines for exercising safely.    Portion Distortion:  -Group instruction provided by PowerPoint slides, verbal discussion, written materials, and food models to support subject matter. The instructor gives an explanation of serving size versus portion size, changes in portions sizes over the last 20 years, and what consists of a serving from each food group.   Stress Management:  -Group instruction provided by verbal instruction, video, and written materials to support subject matter.  Instructors review role of stress in heart disease and how to cope with stress positively.     Exercising on Your Own:  -Group instruction provided by verbal instruction, power point, and written materials to support subject.  Instructors discuss benefits of exercise, components of exercise, frequency and intensity of exercise, and end points for exercise.  Also discuss use of nitroglycerin and activating EMS.  Review options of places to exercise outside of rehab.  Review guidelines for sex with heart disease.   Cardiac Drugs I:  -Group instruction provided by verbal instruction and written materials to support subject.  Instructor reviews cardiac drug classes: antiplatelets, anticoagulants, beta blockers, and statins.  Instructor discusses reasons, side effects, and lifestyle considerations for each drug class.   Cardiac Drugs II:  -Group instruction provided by verbal instruction and written materials to support subject.  Instructor reviews cardiac drug classes: angiotensin converting enzyme inhibitors (ACE-I), angiotensin II receptor blockers (ARBs), nitrates, and calcium channel blockers.  Instructor discusses reasons, side effects, and lifestyle considerations for each drug class.   Anatomy and Physiology of the Circulatory System:  Group verbal and written  instruction and models provide basic cardiac anatomy and physiology, with the coronary electrical and arterial systems. Review of: AMI, Angina, Valve disease, Heart Failure, Peripheral Artery Disease, Cardiac Arrhythmia, Pacemakers, and the ICD.   Other Education:  -Group or individual verbal, written, or video instructions that support the educational goals of the cardiac rehab program.   Holiday Eating Survival Tips:  -Group instruction provided by PowerPoint slides, verbal discussion, and written materials to support subject matter. The instructor gives patients tips, tricks, and techniques to help them not only survive but enjoy the holidays despite the onslaught of food that accompanies the holidays.   Knowledge Questionnaire Score: Knowledge Questionnaire Score - 04/18/19 1028      Knowledge Questionnaire Score   Pre Score  17/24       Core Components/Risk Factors/Patient Goals at Admission: Personal Goals and Risk Factors at Admission - 04/18/19 1032      Core Components/Risk Factors/Patient Goals on Admission    Weight Management  Yes;Weight Maintenance;Weight Loss    Intervention  Weight Management: Develop a combined nutrition and exercise program designed to reach desired caloric intake, while maintaining appropriate intake of nutrient and fiber, sodium and fats, and appropriate energy expenditure required for the weight goal.;Weight Management: Provide education and appropriate resources to help participant work on and attain dietary goals.    Admit Weight  189 lb 6 oz (85.9 kg)    Expected Outcomes  Short Term: Continue to assess and modify interventions until short term weight is achieved;Long Term: Adherence to nutrition and physical activity/exercise program aimed toward attainment of established weight goal;Weight Maintenance: Understanding of the daily nutrition guidelines, which includes 25-35% calories from fat, 7% or less cal from saturated fats, less than 200mg   cholesterol, less than 1.5gm of sodium, & 5 or more servings of fruits and vegetables daily;Weight Loss: Understanding of general recommendations for a balanced deficit meal plan, which promotes 1-2 lb weight loss per week and includes a negative energy balance of [id number] kcal/d;Understanding recommendations for meals to include 15-35% energy as protein, 25-35% energy from fat, 35-60% energy from carbohydrates, less than 200mg  of dietary cholesterol, 20-35 gm of total fiber daily;Understanding of distribution of calorie intake throughout the day with the consumption of 4-5 meals/snacks    Hypertension  Yes    Intervention  Provide education on lifestyle modifcations including regular physical activity/exercise, weight management, moderate sodium restriction and increased consumption of fresh fruit, vegetables, and low fat dairy, alcohol moderation, and smoking cessation.;Monitor prescription use compliance.    Expected Outcomes  Short Term: Continued assessment and intervention until BP is < 140/70mm HG in hypertensive participants. < 130/63mm HG in hypertensive participants with diabetes, heart failure or chronic kidney disease.;Long Term: Maintenance of blood pressure at goal levels.  Lipids  Yes    Intervention  Provide education and support for participant on nutrition & aerobic/resistive exercise along with prescribed medications to achieve LDL 70mg , HDL >40mg .    Expected Outcomes  Short Term: Participant states understanding of desired cholesterol values and is compliant with medications prescribed. Participant is following exercise prescription and nutrition guidelines.;Long Term: Cholesterol controlled with medications as prescribed, with individualized exercise RX and with personalized nutrition plan. Value goals: LDL < 70mg , HDL > 40 mg.       Core Components/Risk Factors/Patient Goals Review:    Core Components/Risk Factors/Patient Goals at Discharge (Final Review):    ITP  Comments: ITP Comments    Row Name 04/18/19 1051           ITP Comments  Dr. Fransico Him, Medical Director [facility]          Comments: Patient attended orientation on 04/18/2019 to review rules and guidelines for program.  Completed 6 minute walk test, Intitial ITP, and exercise prescription.  VSS. Telemetry-NSR.  Asymptomatic. Safety measures and social distancing in place per CDC guidelines. Patient was previously consented for utilization of the [facility] Virtual Cardiac [facility] App. He was discharged secondary to lack of utilization. He wishes to re-enroll in the [facility] app and utilize as a supplement to his in-person exercise. Text message invitation sent to [phone].

## 2019-04-18 NOTE — Progress Notes (Signed)
Cardiac Rehab Med Review Note:  Cardiac Rehab Medication Review by a RN  Does the patient  feel that his/her medications are working for him/her?  yes  Has the patient been experiencing any side effects to the medications prescribed?  no  Does the patient measure his/her own blood pressure or blood glucose at home?  yes   Does the patient have any problems obtaining medications due to transportation or finances?   no  Understanding of regimen: excellent Understanding of indications: good Potential of compliance: excellent    RN comments: No barriers to medication management identified    Willadean Carol 04/18/2019 10:59 AM

## 2019-04-24 ENCOUNTER — Other Ambulatory Visit: Payer: Self-pay

## 2019-04-24 ENCOUNTER — Encounter (HOSPITAL_COMMUNITY)
Admission: RE | Admit: 2019-04-24 | Discharge: 2019-04-24 | Disposition: A | Discharge status: Home / Self Care | Payer: Medicare PPO | Source: Ambulatory Visit | Attending: Cardiology | Admitting: Cardiology

## 2019-04-24 DIAGNOSIS — Z955 Presence of coronary angioplasty implant and graft: Secondary | ICD-10-CM | POA: Diagnosis present

## 2019-04-24 DIAGNOSIS — I214 Non-ST elevation (NSTEMI) myocardial infarction: Secondary | ICD-10-CM | POA: Diagnosis not present

## 2019-04-24 NOTE — Progress Notes (Signed)
Daily Session Note  Patient Details  Name: Nicholas Webb MRN: [id number] Date of Birth: 1940/12/21 Referring Provider:     CARDIAC REHAB PHASE II ORIENTATION from 04/18/2019 in [place]  Referring Provider  Dr. Marlou Porch      Encounter Date: 04/24/2019  Check In:   Capillary Blood Glucose: No results found for this or any previous visit (from the past 24 hour(s)).    Social History   Tobacco Use  Smoking Status Never Smoker  Smokeless Tobacco Never Used    Goals Met:  Exercise tolerated well No report of cardiac concerns or symptoms Strength training completed today  Goals Unmet:  Not Applicable  Comments: Pt started cardiac rehab today.  Pt tolerated light exercise without difficulty. VSS, telemetry-NSR with occ PACs, asymptomatic.  Medication list reconciled. Pt denies barriers to medicaiton compliance.  PSYCHOSOCIAL ASSESSMENT:  PHQ-. Pt exhibits positive coping skills, hopeful outlook with supportive family. No psychosocial needs identified at this time, no psychosocial interventions necessary.  Pt oriented to exercise equipment and routine.    Understanding verbalized.   Dr. Fransico Him is Medical Director for Cardiac Rehab at [facility].

## 2019-04-26 ENCOUNTER — Encounter (HOSPITAL_COMMUNITY)
Admission: RE | Admit: 2019-04-26 | Discharge: 2019-04-26 | Disposition: A | Discharge status: Home / Self Care | Payer: Medicare PPO | Source: Ambulatory Visit | Attending: Cardiology | Admitting: Cardiology

## 2019-04-26 ENCOUNTER — Other Ambulatory Visit: Payer: Self-pay

## 2019-04-26 DIAGNOSIS — I214 Non-ST elevation (NSTEMI) myocardial infarction: Secondary | ICD-10-CM

## 2019-04-26 DIAGNOSIS — Z955 Presence of coronary angioplasty implant and graft: Secondary | ICD-10-CM

## 2019-04-27 ENCOUNTER — Other Ambulatory Visit: Payer: Self-pay | Admitting: Oncology

## 2019-04-28 ENCOUNTER — Telehealth (HOSPITAL_COMMUNITY): Payer: Self-pay | Admitting: *Deleted

## 2019-04-28 ENCOUNTER — Encounter (HOSPITAL_COMMUNITY): Payer: Medicare PPO

## 2019-05-01 ENCOUNTER — Encounter (HOSPITAL_COMMUNITY)
Admission: RE | Admit: 2019-05-01 | Discharge: 2019-05-01 | Disposition: A | Discharge status: Home / Self Care | Payer: Medicare PPO | Source: Ambulatory Visit | Attending: Cardiology | Admitting: Cardiology

## 2019-05-01 ENCOUNTER — Other Ambulatory Visit: Payer: Self-pay

## 2019-05-01 DIAGNOSIS — I214 Non-ST elevation (NSTEMI) myocardial infarction: Secondary | ICD-10-CM | POA: Diagnosis not present

## 2019-05-01 DIAGNOSIS — Z955 Presence of coronary angioplasty implant and graft: Secondary | ICD-10-CM

## 2019-05-03 ENCOUNTER — Other Ambulatory Visit: Payer: Self-pay

## 2019-05-03 ENCOUNTER — Encounter (HOSPITAL_COMMUNITY)
Admission: RE | Admit: 2019-05-03 | Discharge: 2019-05-03 | Disposition: A | Discharge status: Home / Self Care | Payer: Medicare PPO | Source: Ambulatory Visit | Attending: Cardiology | Admitting: Cardiology

## 2019-05-03 DIAGNOSIS — I214 Non-ST elevation (NSTEMI) myocardial infarction: Secondary | ICD-10-CM | POA: Diagnosis not present

## 2019-05-03 DIAGNOSIS — Z955 Presence of coronary angioplasty implant and graft: Secondary | ICD-10-CM

## 2019-05-05 ENCOUNTER — Other Ambulatory Visit: Payer: Self-pay

## 2019-05-05 ENCOUNTER — Encounter (HOSPITAL_COMMUNITY)
Admission: RE | Admit: 2019-05-05 | Discharge: 2019-05-05 | Disposition: A | Discharge status: Home / Self Care | Payer: Medicare PPO | Source: Ambulatory Visit | Attending: Cardiology | Admitting: Cardiology

## 2019-05-05 DIAGNOSIS — Z955 Presence of coronary angioplasty implant and graft: Secondary | ICD-10-CM

## 2019-05-05 DIAGNOSIS — I214 Non-ST elevation (NSTEMI) myocardial infarction: Secondary | ICD-10-CM | POA: Diagnosis not present

## 2019-05-08 ENCOUNTER — Encounter (HOSPITAL_COMMUNITY)
Admission: RE | Admit: 2019-05-08 | Discharge: 2019-05-08 | Disposition: A | Discharge status: Home / Self Care | Payer: Medicare PPO | Source: Ambulatory Visit | Attending: Cardiology | Admitting: Cardiology

## 2019-05-08 ENCOUNTER — Other Ambulatory Visit: Payer: Self-pay

## 2019-05-08 DIAGNOSIS — I214 Non-ST elevation (NSTEMI) myocardial infarction: Secondary | ICD-10-CM | POA: Diagnosis not present

## 2019-05-08 DIAGNOSIS — Z955 Presence of coronary angioplasty implant and graft: Secondary | ICD-10-CM

## 2019-05-08 NOTE — Progress Notes (Signed)
I have reviewed a Home Exercise Prescription with Nicholas Webb . Sena is not currently exercising at home. Pt walks his dog but the walk is constant stop and go. The patient was advised to walk 2-3 days a week for 10 minutes with no interruptions in addition to taking the dog outside.  Nicholas Webb and I discussed how to progress their exercise prescription from 10 minutes to the ideal 30-45 minutes. The patient stated that they understand the exercise prescription. We reviewed exercise guidelines, target heart rate during exercise, RPE Scale, weather conditions, NTG use, endpoints for exercise, warmup and cool down. Patient is encouraged to come to me with any questions. I will continue to follow up with the patient to assist them with progression and safety.      Carma Lair MS, ACSM CEP  2:28 PM 05/08/2019

## 2019-05-10 ENCOUNTER — Encounter (HOSPITAL_COMMUNITY)
Admission: RE | Admit: 2019-05-10 | Discharge: 2019-05-10 | Disposition: A | Discharge status: Home / Self Care | Payer: Medicare PPO | Source: Ambulatory Visit | Attending: Cardiology | Admitting: Cardiology

## 2019-05-10 ENCOUNTER — Other Ambulatory Visit: Payer: Self-pay

## 2019-05-10 DIAGNOSIS — Z955 Presence of coronary angioplasty implant and graft: Secondary | ICD-10-CM

## 2019-05-10 DIAGNOSIS — I214 Non-ST elevation (NSTEMI) myocardial infarction: Secondary | ICD-10-CM

## 2019-05-11 NOTE — Progress Notes (Signed)
Cardiac Individual Treatment Plan  Patient Details  Name: Nicholas Webb MRN: [id number] Date of Birth: 1940-11-28 Referring Provider:     CARDIAC REHAB PHASE II ORIENTATION from 04/18/2019 in [employer]  Referring Provider  Dr. Marlou Porch      Initial Encounter Date:    CARDIAC REHAB PHASE II ORIENTATION from 04/18/2019 in [place]  Date  04/18/19      Visit Diagnosis: NSTEMI (non-ST elevated myocardial infarction) [facility])  Status post coronary artery stent placement  Patient's Home Medications on Admission:  Current Outpatient Medications:  .  Adalimumab (HUMIRA) 40 MG/0.8ML PSKT, Inject 1 each into the skin See admin instructions. Every 2 weeks, Disp: , Rfl:  .  atorvastatin (LIPITOR) 80 MG tablet, Take 1 tablet (80 mg total) by mouth daily at 6 PM., Disp: 90 tablet, Rfl: 3 .  clopidogrel (PLAVIX) 75 MG tablet, Take 1 tablet (75 mg total) by mouth daily. Take (2) tablets the first day then (1) tablet daily thereafter, Disp: 90 tablet, Rfl: 3 .  ELIQUIS 5 MG TABS tablet, TAKE 1 TABLET(5 MG) BY MOUTH TWICE DAILY, Disp: 60 tablet, Rfl: 2 .  esomeprazole (NEXIUM) 20 MG capsule, Take 20 mg by mouth daily at 12 noon. , Disp: , Rfl:  .  fluorouracil (EFUDEX) 5 % cream, Apply topically daily. To scalp, Disp: , Rfl:  .  folic acid (FOLVITE) 1 MG tablet, Take 2 mg by mouth daily. , Disp: , Rfl:  .  methotrexate (RHEUMATREX) 2.5 MG tablet, Take 2.5 mg by mouth 3 (three) times daily., Disp: , Rfl:  .  Metoprolol Succinate 25 MG CS24, Take 12.5 mg by mouth daily., Disp: 45 capsule, Rfl: 3 .  Multiple Vitamins-Minerals (MULTIVITAMIN WITH MINERALS) tablet, Take 1 tablet by mouth daily., Disp: , Rfl:   Past Medical History: Past Medical History:  Diagnosis Date  . Anemia of chronic disease   . Atherosclerosis of both carotid arteries   . Barrett's esophagus   . Dizziness   . GERD (gastroesophageal reflux disease)   . Hx of  adenomatous colonic polyps   . Hyperlipidemia   . Macrocytosis without anemia   . Obesity   . OSA (obstructive sleep apnea)   . Rheumatoid arthritis(714.0)   . Syncope     Tobacco Use: Social History   Tobacco Use  Smoking Status Never Smoker  Smokeless Tobacco Never Used    Labs: Recent Review Scientist, physiological    Labs for ITP Cardiac and Pulmonary Rehab Latest Ref Rng & Units 06/23/2018   Cholestrol 0 - 200 mg/dL 155   LDLCALC 0 - 99 mg/dL 103(H)   HDL >40 mg/dL 45   Trlycerides <150 mg/dL 36   Hemoglobin A1c 4.8 - 5.6 % 5.9(H)      Capillary Blood Glucose: Lab Results  Component Value Date   GLUCAP 81 07/06/2018     Exercise Target Goals: Exercise Program Goal: Individual exercise prescription set using results from initial 6 min walk test and THRR while considering  patient's activity barriers and safety.   Exercise Prescription Goal: Starting with aerobic activity 30 plus minutes a day, 3 days per week for initial exercise prescription. Provide home exercise prescription and guidelines that participant acknowledges understanding prior to discharge.  Activity Barriers & Risk Stratification: Activity Barriers & Cardiac Risk Stratification - 04/18/19 1020      Activity Barriers & Cardiac Risk Stratification   Activity Barriers  Arthritis;Deconditioning;Balance Concerns    Cardiac  Risk Stratification  High       6 Minute Walk: 6 Minute Walk    Row Name 04/18/19 1019         6 Minute Walk   Phase  Initial     Distance  1324 feet     Walk Time  6 minutes     # of Rest Breaks  0     MPH  2.5     METS  2.6     RPE  13     Perceived Dyspnea   0     VO2 Peak  9.29     Symptoms  No     Resting HR  92 bpm     Resting BP  100/58     Resting Oxygen Saturation   96 %     Exercise Oxygen Saturation  during 6 min walk  95 %     Max Ex. HR  120 bpm     Max Ex. BP  134/68     2 Minute Post BP  106/60        Oxygen Initial Assessment:   Oxygen  Re-Evaluation:   Oxygen Discharge (Final Oxygen Re-Evaluation):   Initial Exercise Prescription: Initial Exercise Prescription - 04/18/19 1000      Date of Initial Exercise RX and Referring Provider   Date  04/18/19    Referring Provider  Dr. Marlou Porch    Expected Discharge Date  06/16/19      Recumbant Bike   Level  2    Watts  20    Minutes  15    METs  2.74      NuStep   Level  2    SPM  85    Minutes  15    METs  2.6      Prescription Details   Frequency (times per week)  3    Duration  Progress to 30 minutes of continuous aerobic without signs/symptoms of physical distress      Intensity   THRR 40-80% of Max Heartrate  57-114    Ratings of Perceived Exertion  11-13    Perceived Dyspnea  0-4      Progression   Progression  Continue to progress workloads to maintain intensity without signs/symptoms of physical distress.      Resistance Training   Training Prescription  Yes    Weight  3 lbs.     Reps  10-15       Perform Capillary Blood Glucose checks as needed.  Exercise Prescription Changes: Exercise Prescription Changes    Row Name 04/24/19 1300 05/08/19 0908           Response to Exercise   Blood Pressure (Admit)  119/76  108/72      Blood Pressure (Exercise)  124/80  128/82      Blood Pressure (Exit)  110/52  108/68      Heart Rate (Admit)  76 bpm  71 bpm      Heart Rate (Exercise)  104 bpm  119 bpm      Heart Rate (Exit)  78 bpm  81 bpm      Rating of Perceived Exertion (Exercise)  13  12      Perceived Dyspnea (Exercise)  0  0      Symptoms  None  None      Comments  Pt's first day of exercise  Non      Duration  Progress to 10 minutes continuous  walking  at current work load and total walking time to 30-45 min  Progress to 10 minutes continuous walking  at current work load and total walking time to 30-45 min      Intensity  THRR unchanged  THRR unchanged        Progression   Progression  Continue to progress workloads to maintain intensity  without signs/symptoms of physical distress.  Continue to progress workloads to maintain intensity without signs/symptoms of physical distress.      Average METs  2.5  3.3        Resistance Training   Training Prescription  Yes  Yes      Weight  3lbs  3lbs      Reps  10-15  10-15      Time  10 Minutes  10 Minutes        Interval Training   Interval Training  No  No        Recumbant Bike   Level  2  2.5      Minutes  15  15      METs  2.7  3        NuStep   Level  2  3      SPM  85  100      Minutes  15  15      METs  2.3  3.6        Home Exercise Plan   Plans to continue exercise at  --  Home (comment) Walking      Frequency  --  Add 2 additional days to program exercise sessions.      Initial Home Exercises Provided  --  05/08/19         Exercise Comments: Exercise Comments    Row Name 04/24/19 1327 05/08/19 1438         Exercise Comments  Pt's first day of exercise. Pt tolerated exercise prescription well. Will continue to monitor and progress pt's workloads as tolerated.  Reviewed Home Exercise Plan with pt. Pt is not currently exercising at home. Pt plans to start exercising 2-3 days a week for at least 10 minutes to begin with. Will follow up with pt to see if he has successfully implemented walking.         Exercise Goals and Review: Exercise Goals    Row Name 04/18/19 1024             Exercise Goals   Increase Physical Activity  Yes       Intervention  Provide advice, education, support and counseling about physical activity/exercise needs.;Develop an individualized exercise prescription for aerobic and resistive training based on initial evaluation findings, risk stratification, comorbidities and participant's personal goals.       Expected Outcomes  Short Term: Attend rehab on a regular basis to increase amount of physical activity.;Long Term: Add in home exercise to make exercise part of routine and to increase amount of physical activity.;Long Term:  Exercising regularly at least 3-5 days a week.       Increase Strength and Stamina  Yes       Intervention  Provide advice, education, support and counseling about physical activity/exercise needs.;Develop an individualized exercise prescription for aerobic and resistive training based on initial evaluation findings, risk stratification, comorbidities and participant's personal goals.       Expected Outcomes  Short Term: Increase workloads from initial exercise prescription for resistance, speed, and METs.;Long Term: Improve cardiorespiratory fitness, muscular endurance  and strength as measured by increased METs and functional capacity (6MWT);Short Term: Perform resistance training exercises routinely during rehab and add in resistance training at home       Able to understand and use rate of perceived exertion (RPE) scale  Yes       Intervention  Provide education and explanation on how to use RPE scale       Expected Outcomes  Short Term: Able to use RPE daily in rehab to express subjective intensity level;Long Term:  Able to use RPE to guide intensity level when exercising independently       Knowledge and understanding of Target Heart Rate Range (THRR)  Yes       Intervention  Provide education and explanation of THRR including how the numbers were predicted and where they are located for reference       Expected Outcomes  Long Term: Able to use THRR to govern intensity when exercising independently;Short Term: Able to state/look up THRR;Short Term: Able to use daily as guideline for intensity in rehab       Able to check pulse independently  Yes       Intervention  Provide education and demonstration on how to check pulse in carotid and radial arteries.;Review the importance of being able to check your own pulse for safety during independent exercise       Expected Outcomes  Short Term: Able to explain why pulse checking is important during independent exercise;Long Term: Able to check pulse  independently and accurately       Understanding of Exercise Prescription  Yes       Intervention  Provide education, explanation, and written materials on patient's individual exercise prescription       Expected Outcomes  Short Term: Able to explain program exercise prescription;Long Term: Able to explain home exercise prescription to exercise independently          Exercise Goals Re-Evaluation : Exercise Goals Re-Evaluation    Row Name 05/08/19 1434             Exercise Goal Re-Evaluation   Exercise Goals Review  Increase Strength and Stamina;Increase Physical Activity;Able to understand and use rate of perceived exertion (RPE) scale;Knowledge and understanding of Target Heart Rate Range (THRR);Able to check pulse independently;Understanding of Exercise Prescription       Comments  Reviewed Home Exercise with pt. Also reviewed THRR, weather conditions, RPE Scale, endpoints of exercise, and progressing walking time. Pt verbalized understanding.       Expected Outcomes  Pt will plan to walk 2-3 days for 10 minutes on nonrehab days. Will continue to monitor pt.           Discharge Exercise Prescription (Final Exercise Prescription Changes): Exercise Prescription Changes - 05/08/19 0908      Response to Exercise   Blood Pressure (Admit)  108/72    Blood Pressure (Exercise)  128/82    Blood Pressure (Exit)  108/68    Heart Rate (Admit)  71 bpm    Heart Rate (Exercise)  119 bpm    Heart Rate (Exit)  81 bpm    Rating of Perceived Exertion (Exercise)  12    Perceived Dyspnea (Exercise)  0    Symptoms  None    Comments  Non    Duration  Progress to 10 minutes continuous walking  at current work load and total walking time to 30-45 min    Intensity  THRR unchanged      Progression  Progression  Continue to progress workloads to maintain intensity without signs/symptoms of physical distress.    Average METs  3.3      Resistance Training   Training Prescription  Yes    Weight   3lbs    Reps  10-15    Time  10 Minutes      Interval Training   Interval Training  No      Recumbant Bike   Level  2.5    Minutes  15    METs  3      NuStep   Level  3    SPM  100    Minutes  15    METs  3.6      Home Exercise Plan   Plans to continue exercise at  Home (comment)   Walking   Frequency  Add 2 additional days to program exercise sessions.    Initial Home Exercises Provided  05/08/19       Nutrition:  Target Goals: Understanding of nutrition guidelines, daily intake of sodium 1500mg , cholesterol 200mg , calories 30% from fat and 7% or less from saturated fats, daily to have 5 or more servings of fruits and vegetables.  Biometrics: Pre Biometrics - 04/18/19 1025      Pre Biometrics   Height  5\' 7"  (1.702 m)    Weight  85.9 kg    Waist Circumference  42.25 inches    Hip Circumference  42 inches    Waist to Hip Ratio  1.01 %    BMI (Calculated)  29.65    Triceps Skinfold  21 mm    % Body Fat  31.2 %    Grip Strength  34 kg    Flexibility  11.25 in    Single Leg Stand  0 seconds        Nutrition Therapy Plan and Nutrition Goals:   Nutrition Assessments: Nutrition Assessments - 04/26/19 1218      MEDFICTS Scores   Pre Score  70       Nutrition Goals Re-Evaluation:   Nutrition Goals Discharge (Final Nutrition Goals Re-Evaluation):   Psychosocial: Target Goals: Acknowledge presence or absence of significant depression and/or stress, maximize coping skills, provide positive support system. Participant is able to verbalize types and ability to use techniques and skills needed for reducing stress and depression.  Initial Review & Psychosocial Screening: Initial Psych Review & Screening - 04/18/19 1057      Initial Review   Current issues with  None Identified      Family Dynamics   Good Support System?  Yes    Comments  Mr. Espericueta maintains a positive attitude and outlook. He relies on his daughter who is in the healthcare field and  his wife for support. He enjoys woodworking and utilizes this for stress reduction if needed.      Barriers   Psychosocial barriers to participate in program  There are no identifiable barriers or psychosocial needs.      Screening Interventions   Interventions  Encouraged to exercise       Quality of Life Scores: Quality of Life - 04/18/19 1030      Quality of Life   Select  Quality of Life      Quality of Life Scores   Health/Function Pre  26.67 %    Socioeconomic Pre  28 %    Psych/Spiritual Pre  29.14 %    Family Pre  30 %    GLOBAL Pre  27.85 %  Scores of 19 and below usually indicate a poorer quality of life in these areas.  A difference of  2-3 points is a clinically meaningful difference.  A difference of 2-3 points in the total score of the Quality of Life Index has been associated with significant improvement in overall quality of life, self-image, physical symptoms, and general health in studies assessing change in quality of life.  PHQ-9: Recent Review Flowsheet Data    Depression screen [facility] 2/9 04/18/2019   Decreased Interest 0   Down, Depressed, Hopeless 0   PHQ - 2 Score 0     Interpretation of Total Score  Total Score Depression Severity:  1-4 = Minimal depression, 5-9 = Mild depression, 10-14 = Moderate depression, 15-19 = Moderately severe depression, 20-27 = Severe depression   Psychosocial Evaluation and Intervention: Psychosocial Evaluation - 04/24/19 1200      Psychosocial Evaluation & Interventions   Interventions  Encouraged to exercise with the program and follow exercise prescription    Comments  Mr. Luisi continues to deny psychosocial barriers to participation in CR. He has a positive outlook and attitude. He has a very strong support system. He continues to enjoy woodworking as a hobby.    Expected Outcomes  Patient will continue to have a positive outlook and attitude.    Continue Psychosocial Services   No Follow up required        Psychosocial Re-Evaluation: Psychosocial Re-Evaluation    [place] Name 05/09/19 1114             Psychosocial Re-Evaluation   Current issues with  None Identified       Comments  Mr. Stall continues to deny psychosocial barriers to participation in CR or self health management. He has a very strong support system including a daughter in the medical field. He enjoys wood working and walking his dog as a way to deal with stress in a healthy way. He continues to maintain a positive attitude and outlook. No interventions are needed at this time.       Expected Outcomes  Patient will continue to maintain a positive outlook and attitude. He will utilize his support system as needed.       Interventions  Encouraged to attend Cardiac Rehabilitation for the exercise       Continue Psychosocial Services   No Follow up required          Psychosocial Discharge (Final Psychosocial Re-Evaluation): Psychosocial Re-Evaluation - 05/09/19 1114      Psychosocial Re-Evaluation   Current issues with  None Identified    Comments  Mr. Lutsky continues to deny psychosocial barriers to participation in CR or self health management. He has a very strong support system including a daughter in the medical field. He enjoys wood working and walking his dog as a way to deal with stress in a healthy way. He continues to maintain a positive attitude and outlook. No interventions are needed at this time.    Expected Outcomes  Patient will continue to maintain a positive outlook and attitude. He will utilize his support system as needed.    Interventions  Encouraged to attend Cardiac Rehabilitation for the exercise    Continue Psychosocial Services   No Follow up required       Vocational Rehabilitation: Provide vocational rehab assistance to qualifying candidates.   Vocational Rehab Evaluation & Intervention: Vocational Rehab - 04/18/19 1057      Initial Vocational Rehab Evaluation & Intervention    Assessment shows  need for Vocational Rehabilitation  No       Education: Education Goals: Education classes will be provided on a weekly basis, covering required topics. Participant will state understanding/return demonstration of topics presented.  Learning Barriers/Preferences: Learning Barriers/Preferences - 04/18/19 1057      Learning Barriers/Preferences   Learning Barriers  None       Education Topics: Hypertension, Hypertension Reduction -Define heart disease and high blood pressure. Discus how high blood pressure affects the body and ways to reduce high blood pressure.   Exercise and Your Heart -Discuss why it is important to exercise, the FITT principles of exercise, normal and abnormal responses to exercise, and how to exercise safely.   Angina -Discuss definition of angina, causes of angina, treatment of angina, and how to decrease risk of having angina.   Cardiac Medications -Review what the following cardiac medications are used for, how they affect the body, and side effects that may occur when taking the medications.  Medications include Aspirin, Beta blockers, calcium channel blockers, ACE Inhibitors, angiotensin receptor blockers, diuretics, digoxin, and antihyperlipidemics.   Congestive Heart Failure -Discuss the definition of CHF, how to live with CHF, the signs and symptoms of CHF, and how keep track of weight and sodium intake.   Heart Disease and Intimacy -Discus the effect sexual activity has on the heart, how changes occur during intimacy as we age, and safety during sexual activity.   Smoking Cessation / COPD -Discuss different methods to quit smoking, the health benefits of quitting smoking, and the definition of COPD.   Nutrition I: Fats -Discuss the types of cholesterol, what cholesterol does to the heart, and how cholesterol levels can be controlled.   Nutrition II: Labels -Discuss the different components of food labels and how to read  food label   Heart Parts/Heart Disease and PAD -Discuss the anatomy of the heart, the pathway of blood circulation through the heart, and these are affected by heart disease.   Stress I: Signs and Symptoms -Discuss the causes of stress, how stress may lead to anxiety and depression, and ways to limit stress.   Stress II: Relaxation -Discuss different types of relaxation techniques to limit stress.   Warning Signs of Stroke / TIA -Discuss definition of a stroke, what the signs and symptoms are of a stroke, and how to identify when someone is having stroke.   Knowledge Questionnaire Score: Knowledge Questionnaire Score - 04/18/19 1028      Knowledge Questionnaire Score   Pre Score  17/24       Core Components/Risk Factors/Patient Goals at Admission: Personal Goals and Risk Factors at Admission - 04/18/19 1032      Core Components/Risk Factors/Patient Goals on Admission    Weight Management  Yes;Weight Maintenance;Weight Loss    Intervention  Weight Management: Develop a combined nutrition and exercise program designed to reach desired caloric intake, while maintaining appropriate intake of nutrient and fiber, sodium and fats, and appropriate energy expenditure required for the weight goal.;Weight Management: Provide education and appropriate resources to help participant work on and attain dietary goals.    Admit Weight  189 lb 6 oz (85.9 kg)    Expected Outcomes  Short Term: Continue to assess and modify interventions until short term weight is achieved;Long Term: Adherence to nutrition and physical activity/exercise program aimed toward attainment of established weight goal;Weight Maintenance: Understanding of the daily nutrition guidelines, which includes 25-35% calories from fat, 7% or less cal from saturated fats, less than 200mg  cholesterol, less  than 1.5gm of sodium, & 5 or more servings of fruits and vegetables daily;Weight Loss: Understanding of general recommendations for a  balanced deficit meal plan, which promotes 1-2 lb weight loss per week and includes a negative energy balance of [phone] kcal/d;Understanding recommendations for meals to include 15-35% energy as protein, 25-35% energy from fat, 35-60% energy from carbohydrates, less than 200mg  of dietary cholesterol, 20-35 gm of total fiber daily;Understanding of distribution of calorie intake throughout the day with the consumption of 4-5 meals/snacks    Hypertension  Yes    Intervention  Provide education on lifestyle modifcations including regular physical activity/exercise, weight management, moderate sodium restriction and increased consumption of fresh fruit, vegetables, and low fat dairy, alcohol moderation, and smoking cessation.;Monitor prescription use compliance.    Expected Outcomes  Short Term: Continued assessment and intervention until BP is < 140/55mm HG in hypertensive participants. < 130/69mm HG in hypertensive participants with diabetes, heart failure or chronic kidney disease.;Long Term: Maintenance of blood pressure at goal levels.    Lipids  Yes    Intervention  Provide education and support for participant on nutrition & aerobic/resistive exercise along with prescribed medications to achieve LDL 70mg , HDL >40mg .    Expected Outcomes  Short Term: Participant states understanding of desired cholesterol values and is compliant with medications prescribed. Participant is following exercise prescription and nutrition guidelines.;Long Term: Cholesterol controlled with medications as prescribed, with individualized exercise RX and with personalized nutrition plan. Value goals: LDL < 70mg , HDL > 40 mg.       Core Components/Risk Factors/Patient Goals Review:  Goals and Risk Factor Review    Row Name 04/24/19 1133 05/09/19 1116           Core Components/Risk Factors/Patient Goals Review   Personal Goals Review  Weight Management/Obesity;Hypertension  Weight Management/Obesity;Hypertension       Review  Patient has several CAD risk factors. He is very eager to participate in CR for modifications. He continues to take his medications as prescribed and eats a heart healthy diet.  Patient has several CAD risk factors. He continues to be very eager to participate in CR for modifications. He continues to take his medications as prescribed and eats a heart healthy diet. His hypertension is controlled with medications and diet.VSS.      Expected Outcomes  Patient will continue to participate in CR for risk factor modification.  Patient will continue to participate in CR for risk factor modification.         Core Components/Risk Factors/Patient Goals at Discharge (Final Review):  Goals and Risk Factor Review - 05/09/19 1116      Core Components/Risk Factors/Patient Goals Review   Personal Goals Review  Weight Management/Obesity;Hypertension    Review  Patient has several CAD risk factors. He continues to be very eager to participate in CR for modifications. He continues to take his medications as prescribed and eats a heart healthy diet. His hypertension is controlled with medications and diet.VSS.    Expected Outcomes  Patient will continue to participate in CR for risk factor modification.       ITP Comments: ITP Comments    Row Name 04/18/19 1051 04/24/19 1201 05/09/19 1111       ITP Comments  Dr. Fransico Him, Medical Director [facility] Cardiac Rehab  Patient completed his first cardiac rehab exercise session today and tolerated very well. VSS. Denied complaints.  30 day ITP review: Mr. Caffee continues to participate in cardiac rehab. VSS. Continues to deny cardiac complaints  or concerns. He works to an RPE of 11-13. His weight remains stable. Per EP note, he is not currently exercising at home but has been strongly encouraged to do so. He feels his stamina and strength are improving as he is able to do more "around the hours" with better ease.        Comments: See ITP comments.Barnet Pall, RN,BSN 05/11/2019 2:11 PM

## 2019-05-12 ENCOUNTER — Other Ambulatory Visit: Payer: Self-pay

## 2019-05-12 ENCOUNTER — Encounter (HOSPITAL_COMMUNITY)
Admission: RE | Admit: 2019-05-12 | Discharge: 2019-05-12 | Disposition: A | Discharge status: Home / Self Care | Payer: Medicare PPO | Source: Ambulatory Visit | Attending: Cardiology | Admitting: Cardiology

## 2019-05-12 DIAGNOSIS — Z955 Presence of coronary angioplasty implant and graft: Secondary | ICD-10-CM

## 2019-05-12 DIAGNOSIS — I214 Non-ST elevation (NSTEMI) myocardial infarction: Secondary | ICD-10-CM

## 2019-05-15 ENCOUNTER — Encounter (HOSPITAL_COMMUNITY)
Admission: RE | Admit: 2019-05-15 | Discharge: 2019-05-15 | Disposition: A | Discharge status: Home / Self Care | Payer: Medicare PPO | Source: Ambulatory Visit | Attending: Cardiology | Admitting: Cardiology

## 2019-05-15 ENCOUNTER — Other Ambulatory Visit: Payer: Self-pay

## 2019-05-15 VITALS — Ht 67.0 in | Wt 189.0 lb

## 2019-05-15 DIAGNOSIS — I214 Non-ST elevation (NSTEMI) myocardial infarction: Secondary | ICD-10-CM | POA: Diagnosis not present

## 2019-05-15 DIAGNOSIS — Z955 Presence of coronary angioplasty implant and graft: Secondary | ICD-10-CM

## 2019-05-15 NOTE — Progress Notes (Signed)
Nicholas Webb 79 y.o. male Nutrition Note  Visit Diagnosis: NSTEMI (non-ST elevated myocardial infarction) [facility])  Status post coronary artery stent placement   Past Medical History:  Diagnosis Date  . Anemia of chronic disease   . Atherosclerosis of both carotid arteries   . Barrett's esophagus   . Dizziness   . GERD (gastroesophageal reflux disease)   . Hx of adenomatous colonic polyps   . Hyperlipidemia   . Macrocytosis without anemia   . Obesity   . OSA (obstructive sleep apnea)   . Rheumatoid arthritis(714.0)   . Syncope      Medications reviewed.   Current Outpatient Medications:  .  Adalimumab (HUMIRA) 40 MG/0.8ML PSKT, Inject 1 each into the skin See admin instructions. Every 2 weeks, Disp: , Rfl:  .  atorvastatin (LIPITOR) 80 MG tablet, Take 1 tablet (80 mg total) by mouth daily at 6 PM., Disp: 90 tablet, Rfl: 3 .  clopidogrel (PLAVIX) 75 MG tablet, Take 1 tablet (75 mg total) by mouth daily. Take (2) tablets the first day then (1) tablet daily thereafter, Disp: 90 tablet, Rfl: 3 .  ELIQUIS 5 MG TABS tablet, TAKE 1 TABLET(5 MG) BY MOUTH TWICE DAILY, Disp: 60 tablet, Rfl: 2 .  esomeprazole (NEXIUM) 20 MG capsule, Take 20 mg by mouth daily at 12 noon. , Disp: , Rfl:  .  fluorouracil (EFUDEX) 5 % cream, Apply topically daily. To scalp, Disp: , Rfl:  .  folic acid (FOLVITE) 1 MG tablet, Take 2 mg by mouth daily. , Disp: , Rfl:  .  methotrexate (RHEUMATREX) 2.5 MG tablet, Take 2.5 mg by mouth 3 (three) times daily., Disp: , Rfl:  .  Metoprolol Succinate 25 MG CS24, Take 12.5 mg by mouth daily., Disp: 45 capsule, Rfl: 3 .  Multiple Vitamins-Minerals (MULTIVITAMIN WITH MINERALS) tablet, Take 1 tablet by mouth daily., Disp: , Rfl:    Ht Readings from Last 1 Encounters:  04/18/19 5\' 7"  (1.702 m)     Wt Readings from Last 3 Encounters:  04/18/19 189 lb 6 oz (85.9 kg)  12/15/18 191 lb (86.6 kg)  11/30/18 186 lb (84.4 kg)     There is no height or weight on file  to calculate BMI.   Social History   Tobacco Use  Smoking Status Never Smoker  Smokeless Tobacco Never Used     Lab Results  Component Value Date   CHOL 155 06/23/2018   Lab Results  Component Value Date   HDL 45 06/23/2018   Lab Results  Component Value Date   LDLCALC 103 (H) 06/23/2018   Lab Results  Component Value Date   TRIG 36 06/23/2018     Lab Results  Component Value Date   HGBA1C 5.9 (H) 06/23/2018     CBG (last 3)  No results for input(s): GLUCAP in the last 72 hours.   Nutrition Note  Spoke with pt. Nutrition Plan and Nutrition Survey goals reviewed with pt.  Pt has Pre-diabetes. No interest in setting nutrition goals. Discussed principles of heart healthy diet including more fruits, veggies, whole grains, and less animal products. Pt expressed understanding of the information reviewed.   Nutrition Diagnosis ? Food-and nutrition-related knowledge deficit related to lack of exposure to information as related to diagnosis of: ? CVD ? Pre-diabetes  Nutrition Intervention ? Pt's individual nutrition plan reviewed with pt. ? Benefits of adopting Heart Healthy diet discussed when Medficts reviewed.   ? Continue client-centered nutrition education by RD, as part of  interdisciplinary care.  Goal(s) ? Pt to build a healthy plate including vegetables, fruits, whole grains, and low-fat dairy products in a heart healthy meal plan.  Plan:   Will provide client-centered nutrition education as part of interdisciplinary care  Monitor and evaluate progress toward nutrition goal with team.   Michaele Offer, MS, RDN, LDN

## 2019-05-16 ENCOUNTER — Telehealth (HOSPITAL_COMMUNITY): Payer: Self-pay

## 2019-05-16 NOTE — Telephone Encounter (Signed)
Called to schedule ivc filter retrieval, no answer, left vm. AW 

## 2019-05-17 ENCOUNTER — Other Ambulatory Visit: Payer: Self-pay

## 2019-05-17 ENCOUNTER — Telehealth: Payer: Self-pay | Admitting: Cardiology

## 2019-05-17 ENCOUNTER — Encounter (HOSPITAL_COMMUNITY)
Admission: RE | Admit: 2019-05-17 | Discharge: 2019-05-17 | Disposition: A | Discharge status: Home / Self Care | Payer: Medicare PPO | Source: Ambulatory Visit | Attending: Cardiology | Admitting: Cardiology

## 2019-05-17 DIAGNOSIS — I214 Non-ST elevation (NSTEMI) myocardial infarction: Secondary | ICD-10-CM | POA: Diagnosis not present

## 2019-05-17 DIAGNOSIS — Z955 Presence of coronary angioplasty implant and graft: Secondary | ICD-10-CM

## 2019-05-17 NOTE — Telephone Encounter (Signed)
Spoke with pt and discussed with pt that he could call daughter on phone and put speaker on that way could be involved with visit without needing to physically come to office for visit Pt verbalized understanding Also instructed pt if notes low B/P may eat salty snack and drink plenty of fluids .Will forward to Dr Marlou Porch for review and recommendations ./cy

## 2019-05-17 NOTE — Telephone Encounter (Signed)
  Pt requesting to bring his daughter Mickel Baas to his appt on 06/13/19, He said she's a NP and would like do discuss medication with Dr. Marlou Porch. Also, when he came in for his cardiac rehab this morning, his BP was 96/57 lower than normal and when he left at 12:15 his PB was 110/60.   Please call

## 2019-05-19 ENCOUNTER — Other Ambulatory Visit: Payer: Self-pay

## 2019-05-19 ENCOUNTER — Encounter (HOSPITAL_COMMUNITY)
Admission: RE | Admit: 2019-05-19 | Discharge: 2019-05-19 | Disposition: A | Discharge status: Home / Self Care | Payer: Medicare PPO | Source: Ambulatory Visit | Attending: Cardiology | Admitting: Cardiology

## 2019-05-19 DIAGNOSIS — I214 Non-ST elevation (NSTEMI) myocardial infarction: Secondary | ICD-10-CM | POA: Diagnosis not present

## 2019-05-19 DIAGNOSIS — Z955 Presence of coronary angioplasty implant and graft: Secondary | ICD-10-CM

## 2019-05-19 NOTE — Telephone Encounter (Signed)
Dr Marlou Porch is aware of this information and no new orders were given.

## 2019-05-22 ENCOUNTER — Other Ambulatory Visit: Payer: Self-pay

## 2019-05-22 ENCOUNTER — Encounter (HOSPITAL_COMMUNITY)
Admission: RE | Admit: 2019-05-22 | Discharge: 2019-05-22 | Disposition: A | Discharge status: Home / Self Care | Payer: Medicare PPO | Source: Ambulatory Visit | Attending: Cardiology | Admitting: Cardiology

## 2019-05-22 DIAGNOSIS — I214 Non-ST elevation (NSTEMI) myocardial infarction: Secondary | ICD-10-CM | POA: Diagnosis not present

## 2019-05-24 ENCOUNTER — Encounter (HOSPITAL_COMMUNITY)
Admission: RE | Admit: 2019-05-24 | Discharge: 2019-05-24 | Disposition: A | Discharge status: Home / Self Care | Payer: Medicare PPO | Source: Ambulatory Visit | Attending: Cardiology | Admitting: Cardiology

## 2019-05-24 ENCOUNTER — Other Ambulatory Visit: Payer: Self-pay

## 2019-05-24 DIAGNOSIS — I214 Non-ST elevation (NSTEMI) myocardial infarction: Secondary | ICD-10-CM

## 2019-05-24 DIAGNOSIS — Z955 Presence of coronary angioplasty implant and graft: Secondary | ICD-10-CM

## 2019-05-26 ENCOUNTER — Encounter (HOSPITAL_COMMUNITY)
Admission: RE | Admit: 2019-05-26 | Discharge: 2019-05-26 | Disposition: A | Discharge status: Home / Self Care | Payer: Medicare PPO | Source: Ambulatory Visit | Attending: Cardiology | Admitting: Cardiology

## 2019-05-26 ENCOUNTER — Other Ambulatory Visit: Payer: Self-pay

## 2019-05-26 DIAGNOSIS — I214 Non-ST elevation (NSTEMI) myocardial infarction: Secondary | ICD-10-CM

## 2019-05-26 DIAGNOSIS — Z955 Presence of coronary angioplasty implant and graft: Secondary | ICD-10-CM

## 2019-05-29 ENCOUNTER — Encounter (HOSPITAL_COMMUNITY)
Admission: RE | Admit: 2019-05-29 | Discharge: 2019-05-29 | Disposition: A | Discharge status: Home / Self Care | Payer: Medicare PPO | Source: Ambulatory Visit | Attending: Cardiology | Admitting: Cardiology

## 2019-05-29 ENCOUNTER — Other Ambulatory Visit: Payer: Self-pay

## 2019-05-29 DIAGNOSIS — Z955 Presence of coronary angioplasty implant and graft: Secondary | ICD-10-CM

## 2019-05-29 DIAGNOSIS — I214 Non-ST elevation (NSTEMI) myocardial infarction: Secondary | ICD-10-CM

## 2019-05-31 ENCOUNTER — Encounter (HOSPITAL_COMMUNITY)
Admission: RE | Admit: 2019-05-31 | Discharge: 2019-05-31 | Disposition: A | Discharge status: Home / Self Care | Payer: Medicare PPO | Source: Ambulatory Visit | Attending: Cardiology | Admitting: Cardiology

## 2019-05-31 ENCOUNTER — Other Ambulatory Visit: Payer: Self-pay

## 2019-05-31 DIAGNOSIS — Z955 Presence of coronary angioplasty implant and graft: Secondary | ICD-10-CM

## 2019-05-31 DIAGNOSIS — I214 Non-ST elevation (NSTEMI) myocardial infarction: Secondary | ICD-10-CM | POA: Diagnosis not present

## 2019-06-02 ENCOUNTER — Other Ambulatory Visit: Payer: Self-pay

## 2019-06-02 ENCOUNTER — Encounter (HOSPITAL_COMMUNITY)
Admission: RE | Admit: 2019-06-02 | Discharge: 2019-06-02 | Disposition: A | Discharge status: Home / Self Care | Payer: Medicare PPO | Source: Ambulatory Visit | Attending: Cardiology | Admitting: Cardiology

## 2019-06-02 DIAGNOSIS — Z955 Presence of coronary angioplasty implant and graft: Secondary | ICD-10-CM | POA: Diagnosis present

## 2019-06-02 DIAGNOSIS — I214 Non-ST elevation (NSTEMI) myocardial infarction: Secondary | ICD-10-CM | POA: Diagnosis not present

## 2019-06-05 ENCOUNTER — Other Ambulatory Visit: Payer: Self-pay

## 2019-06-05 ENCOUNTER — Encounter (HOSPITAL_COMMUNITY)
Admission: RE | Admit: 2019-06-05 | Discharge: 2019-06-05 | Disposition: A | Discharge status: Home / Self Care | Payer: Medicare PPO | Source: Ambulatory Visit | Attending: Cardiology | Admitting: Cardiology

## 2019-06-05 DIAGNOSIS — I214 Non-ST elevation (NSTEMI) myocardial infarction: Secondary | ICD-10-CM | POA: Diagnosis not present

## 2019-06-05 DIAGNOSIS — Z955 Presence of coronary angioplasty implant and graft: Secondary | ICD-10-CM

## 2019-06-07 ENCOUNTER — Encounter (HOSPITAL_COMMUNITY)
Admission: RE | Admit: 2019-06-07 | Discharge: 2019-06-07 | Disposition: A | Discharge status: Home / Self Care | Payer: Medicare PPO | Source: Ambulatory Visit | Attending: Cardiology | Admitting: Cardiology

## 2019-06-07 ENCOUNTER — Other Ambulatory Visit: Payer: Self-pay

## 2019-06-07 ENCOUNTER — Telehealth: Payer: Self-pay | Admitting: *Deleted

## 2019-06-07 DIAGNOSIS — I214 Non-ST elevation (NSTEMI) myocardial infarction: Secondary | ICD-10-CM | POA: Diagnosis not present

## 2019-06-07 DIAGNOSIS — Z955 Presence of coronary angioplasty implant and graft: Secondary | ICD-10-CM

## 2019-06-07 NOTE — Progress Notes (Signed)
NEUROLOGY FOLLOW UP OFFICE NOTE  Nicholas Webb [id number]  HISTORY OF PRESENT ILLNESS: Nicholas Webb is a 5 year oldright-handedmale with rheumatoid arthritis, obstructive sleep apnea, hyperlipidemia, and carotid artery disease whofollows up for headache, vertigo and memory deficits.  UPDATE: Repeat CT head from 01/31/2019 showed further decrease in size of parietal skull lesion with no new changes.  He was told he may follow up as needed. He is still participating in cardiac rehab.  He plans to finish next week. He continues on apixaban for bilateral pulmonary emboli  No recent headaches or dizziness.      HISTORY: Since 2017, he has had vertigo, described as a spinning sensation with change in position, lasting a couple of minutes. There was no associated double vision or nausea. CT of head from 08/12/15 was personally reviewed and was normal. Carotid ultrasound from 08/26/2015 revealed minor carotid atherosclerosis bilaterally but without hemodynamically significant stenosis. Patent antegrade flow of the vertebral arteries were noted. He reportedly had an abnormal EKG with right bundle branch block. Echocardiogram from 11/05/2015 revealed ejection fraction of 55-60% with grade 1 diastolic dysfunction and mildly dilated atrium but otherwise unremarkable. He went to vestibular rehab which resolved the problem. 6 months ago, it started to recur. It is not as severe as the first episode. He can get up out of the bed and feel lightheaded. When he has the dizziness, he may sometimes be unsteady. He has had a couple of falls over the past year, which he attributes to tripping. He also endorses a moderate bi-frontal pressure-like headache. No associated symptoms. They are triggered when bending over (body or neck), lasting for just as long as he is bent over. It occurs every time he bends over. He underwent vestibular rehab in January and vertigo resolved.  He also has had  some memory deficits. For example, he forgot to pay insurance bill. He may forget to do things that his wife asks him to perform. B12 and TSH reportedly normal. There is no family history of dementia.  He was admitted to [facility]18 for unstable angina/NSTEMI, presenting with syncope.  CT head showed a destructive lesion involving the parasagittal parietal and occipital bones of the skull with intracranial and extracranial extension as well as smaller lesion involving the occipital bone in the midline.  No acute intracranial findings.  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  Underwent metastatic workup.  CT of chest/abdomen/pelvis demonstrated acute bilateral pulmonary emboli with necrotic mediastinal adenopathy and pulmonary nodules.  Venous doppler revealed acute left femoral vein thrombosis.  He was treated for PE in hospital with heparin and discharged on apixaban.  PET scan showed nonactive pulmonary nodules, thought to be reactive, but no evidence of primary malignancy.  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 Due to regression of skull mass, planned biopsy was cancelled, favoring monitoring with planned repeat imaging in December.  Past medications: Gabapentin (unable to tolerate)  PAST MEDICAL HISTORY: Past Medical History:  Diagnosis Date  . Anemia of chronic disease   . Atherosclerosis of both carotid arteries   . Barrett's esophagus   . Dizziness   . GERD (gastroesophageal reflux  disease)   . Hx of adenomatous colonic polyps   . Hyperlipidemia   . Macrocytosis without anemia   . Obesity     . OSA (obstructive sleep apnea)   . Rheumatoid arthritis(714.0)   . Syncope     MEDICATIONS: Current Outpatient Medications on File Prior to Visit  Medication Sig Dispense Refill  . Adalimumab (HUMIRA) 40 MG/0.8ML PSKT Inject 1 each into the skin See admin instructions. Every 2 weeks    . atorvastatin (LIPITOR) 80 MG tablet Take 1 tablet (80 mg total) by mouth daily at 6 PM. 90 tablet 3  . clopidogrel (PLAVIX) 75 MG tablet Take 1 tablet (75 mg total) by mouth daily. Take (2) tablets the first day then (1) tablet daily thereafter 90 tablet 3  . ELIQUIS 5 MG TABS tablet TAKE 1 TABLET(5 MG) BY MOUTH TWICE DAILY 60 tablet 2  . esomeprazole (NEXIUM) 20 MG capsule Take 20 mg by mouth daily at 12 noon.     . fluorouracil (EFUDEX) 5 % cream Apply topically daily. To scalp    . folic acid (FOLVITE) 1 MG tablet Take 2 mg by mouth daily.     . methotrexate (RHEUMATREX) 2.5 MG tablet Take 2.5 mg by mouth 3 (three) times daily.    . Metoprolol Succinate 25 MG CS24 Take 12.5 mg by mouth daily. 45 capsule 3  . Multiple Vitamins-Minerals (MULTIVITAMIN WITH MINERALS) tablet Take 1 tablet by mouth daily.     No current facility-administered medications on file prior to visit.    ALLERGIES: No Known Allergies  FAMILY HISTORY: Family History  Problem Relation Age of Onset  . Stroke Mother   . Glaucoma Mother   . Cancer Father   . Prostate cancer Father   . Colon cancer Father     SOCIAL HISTORY: Social History   Socioeconomic History  . Marital status: Married    Spouse name: Katharine Look  . Number of children: 2  . Years of education: Not on file  . Highest education level: Bachelor's degree (e.g., BA, AB, BS)  Occupational History    Employer: [employer]  Tobacco Use  . Smoking status: Never Smoker  . Smokeless tobacco: Never Used  Substance and Sexual Activity  . Alcohol use: Yes    Alcohol/week: 1.0 standard drinks    Types: 1 Glasses of wine per week    Comment:  OCCASION  . Drug use: No  . Sexual activity: Not on file  Other Topics Concern  . Not on file  Social History Narrative   Patient is right-handed.  He lives with his wife in a one level home. He does not exercise.    Social Determinants of Health   Financial Resource Strain: Low Risk   . Difficulty of Paying Living Expenses: Not hard at all  Food Insecurity: No Food Insecurity  . Worried About Charity fundraiser in the Last Year: Never true  . Ran Out of Food in the Last Year: Never true  Transportation Needs: No Transportation Needs  . Lack of Transportation (Medical): No  . Lack of Transportation (Non-Medical): No  Physical Activity: Inactive  . Days of Exercise per Week: 0 days  . Minutes of Exercise per Session: 0 min  Stress: No Stress Concern Present  . Feeling of Stress : Not at all  Social Connections: Slightly Isolated  . Frequency of Communication with Friends and Family: Three times a week  . Frequency of Social Gatherings with Friends and Family: Once  a week  . Attends Religious Services: More than 4 times per year  . Active Member of Clubs or Organizations: No  . Attends Archivist Meetings: Not on file  . Marital Status: Married  Human resources officer Violence:   . Fear of Current or Ex-Partner:   . Emotionally Abused:   Marland Kitchen Physically Abused:   . Sexually Abused:     PHYSICAL EXAM: Blood pressure 114/66, pulse 74, height 5\' 9"  (1.753 m), weight 187 lb (84.8 kg), SpO2 96 %. General: No acute distress.  Patient appears well-groomed.   Head:  Normocephalic/atraumatic Eyes:  Fundi examined but not visualized Neck: supple, no paraspinal tenderness, full range of motion Heart:  Regular rate and rhythm Lungs:  Clear to auscultation bilaterally Back: No paraspinal tenderness Neurological Exam: alert and oriented to person, place, and time. Attention span and concentration intact, recent and remote memory intact, fund of knowledge intact.  Speech fluent and not  dysarthric, language intact.  CN II-XII intact. Bulk and tone normal, muscle strength 5/5 throughout.  Sensation to light touch, temperature and vibration intact.  Deep tendon reflexes 2+ throughout, toes downgoing.  Finger to nose and heel to shin testing intact.  Gait normal, Romberg negative.  IMPRESSION: 1.  Episodic tension-type headache, resolved 2.  Benign paroxysmal positional vertigo, resolved 3.  Skull mass, stable/decreased size on most recent imaging. 4.  NSTEMI s/p drug-eluting stent placement 5.  Bilateral pulmonary emboli 6.  Small left vestibular schwannoma, incidental finding  PLAN: Follow up as needed  Metta Clines, DO  CC: Maurice Small, MD

## 2019-06-07 NOTE — Telephone Encounter (Signed)
Called to request to change his 06/16/19 office visit to virtual visit. Notified Ariel in scheduling to make the change and call patient to confirm.

## 2019-06-08 ENCOUNTER — Ambulatory Visit (INDEPENDENT_AMBULATORY_CARE_PROVIDER_SITE_OTHER): Payer: Medicare PPO | Admitting: Neurology

## 2019-06-08 ENCOUNTER — Encounter: Payer: Self-pay | Admitting: Neurology

## 2019-06-08 VITALS — BP 114/66 | HR 74 | Ht 69.0 in | Wt 187.0 lb

## 2019-06-08 DIAGNOSIS — H8110 Benign paroxysmal vertigo, unspecified ear: Secondary | ICD-10-CM

## 2019-06-08 DIAGNOSIS — I214 Non-ST elevation (NSTEMI) myocardial infarction: Secondary | ICD-10-CM

## 2019-06-08 DIAGNOSIS — M898X8 Other specified disorders of bone, other site: Secondary | ICD-10-CM

## 2019-06-08 DIAGNOSIS — I2699 Other pulmonary embolism without acute cor pulmonale: Secondary | ICD-10-CM

## 2019-06-08 DIAGNOSIS — G44219 Episodic tension-type headache, not intractable: Secondary | ICD-10-CM

## 2019-06-08 DIAGNOSIS — D333 Benign neoplasm of cranial nerves: Secondary | ICD-10-CM

## 2019-06-08 NOTE — Patient Instructions (Signed)
Follow up as needed

## 2019-06-08 NOTE — Progress Notes (Signed)
Cardiac Individual Treatment Plan  Patient Details  Name: Nicholas Webb MRN: [id number] Date of Birth: 09-02-1940 Referring Provider:     CARDIAC REHAB PHASE II ORIENTATION from 04/18/2019 in [place]  Referring Provider  Dr. Marlou Porch      Initial Encounter Date:    CARDIAC REHAB PHASE II ORIENTATION from 04/18/2019 in [place]  Date  04/18/19      Visit Diagnosis: NSTEMI (non-ST elevated myocardial infarction) [facility])  Status post coronary artery stent placement  Patient's Home Medications on Admission:  Current Outpatient Medications:  .  Adalimumab (HUMIRA) 40 MG/0.8ML PSKT, Inject 1 each into the skin See admin instructions. Every 2 weeks, Disp: , Rfl:  .  atorvastatin (LIPITOR) 80 MG tablet, Take 1 tablet (80 mg total) by mouth daily at 6 PM., Disp: 90 tablet, Rfl: 3 .  clopidogrel (PLAVIX) 75 MG tablet, Take 1 tablet (75 mg total) by mouth daily. Take (2) tablets the first day then (1) tablet daily thereafter, Disp: 90 tablet, Rfl: 3 .  ELIQUIS 5 MG TABS tablet, TAKE 1 TABLET(5 MG) BY MOUTH TWICE DAILY, Disp: 60 tablet, Rfl: 2 .  esomeprazole (NEXIUM) 20 MG capsule, Take 20 mg by mouth daily at 12 noon. , Disp: , Rfl:  .  fluorouracil (EFUDEX) 5 % cream, Apply topically daily. To scalp, Disp: , Rfl:  .  folic acid (FOLVITE) 1 MG tablet, Take 2 mg by mouth daily. , Disp: , Rfl:  .  methotrexate (RHEUMATREX) 2.5 MG tablet, Take 2.5 mg by mouth once a week. 8 pills on Sunday and 8 pills on Monday, Disp: , Rfl:  .  Metoprolol Succinate 25 MG CS24, Take 12.5 mg by mouth daily. (Patient taking differently: Take 12.5 mg by mouth daily. Take 1/2 pill), Disp: 45 capsule, Rfl: 3 .  Multiple Vitamins-Minerals (MULTIVITAMIN WITH MINERALS) tablet, Take 1 tablet by mouth daily., Disp: , Rfl:   Past Medical History: Past Medical History:  Diagnosis Date  . Anemia of chronic disease   . Atherosclerosis of both carotid  arteries   . Barrett's esophagus   . Dizziness   . GERD (gastroesophageal reflux disease)   . Hx of adenomatous colonic polyps   . Hyperlipidemia   . Macrocytosis without anemia   . Obesity   . OSA (obstructive sleep apnea)   . Rheumatoid arthritis(714.0)   . Syncope     Tobacco Use: Social History   Tobacco Use  Smoking Status Never Smoker  Smokeless Tobacco Never Used    Labs: Recent Review Scientist, physiological    Labs for ITP Cardiac and Pulmonary Rehab Latest Ref Rng & Units 06/23/2018   Cholestrol 0 - 200 mg/dL 155   LDLCALC 0 - 99 mg/dL 103(H)   HDL >40 mg/dL 45   Trlycerides <150 mg/dL 36   Hemoglobin A1c 4.8 - 5.6 % 5.9(H)      Capillary Blood Glucose: Lab Results  Component Value Date   GLUCAP 81 07/06/2018     Exercise Target Goals: Exercise Program Goal: Individual exercise prescription set using results from initial 6 min walk test and THRR while considering  patient's activity barriers and safety.   Exercise Prescription Goal: Starting with aerobic activity 30 plus minutes a day, 3 days per week for initial exercise prescription. Provide home exercise prescription and guidelines that participant acknowledges understanding prior to discharge.  Activity Barriers & Risk Stratification: Activity Barriers & Cardiac Risk Stratification - 04/18/19 1020  Activity Barriers & Cardiac Risk Stratification   Activity Barriers  Arthritis;Deconditioning;Balance Concerns    Cardiac Risk Stratification  High       6 Minute Walk: 6 Minute Walk    Row Name 04/18/19 1019         6 Minute Walk   Phase  Initial     Distance  1324 feet     Walk Time  6 minutes     # of Rest Breaks  0     MPH  2.5     METS  2.6     RPE  13     Perceived Dyspnea   0     VO2 Peak  9.29     Symptoms  No     Resting HR  92 bpm     Resting BP  100/58     Resting Oxygen Saturation   96 %     Exercise Oxygen Saturation  during 6 min walk  95 %     Max Ex. HR  120 bpm     Max  Ex. BP  134/68     2 Minute Post BP  106/60        Oxygen Initial Assessment:   Oxygen Re-Evaluation:   Oxygen Discharge (Final Oxygen Re-Evaluation):   Initial Exercise Prescription: Initial Exercise Prescription - 04/18/19 1000      Date of Initial Exercise RX and Referring Provider   Date  04/18/19    Referring Provider  Dr. Marlou Porch    Expected Discharge Date  06/16/19      Recumbant Bike   Level  2    Watts  20    Minutes  15    METs  2.74      NuStep   Level  2    SPM  85    Minutes  15    METs  2.6      Prescription Details   Frequency (times per week)  3    Duration  Progress to 30 minutes of continuous aerobic without signs/symptoms of physical distress      Intensity   THRR 40-80% of Max Heartrate  57-114    Ratings of Perceived Exertion  11-13    Perceived Dyspnea  0-4      Progression   Progression  Continue to progress workloads to maintain intensity without signs/symptoms of physical distress.      Resistance Training   Training Prescription  Yes    Weight  3 lbs.     Reps  10-15       Perform Capillary Blood Glucose checks as needed.  Exercise Prescription Changes:  Exercise Prescription Changes    Row Name 04/24/19 1300 05/08/19 0908 05/19/19 1602 06/07/19 1601       Response to Exercise   Blood Pressure (Admit)  119/76  108/72  108/64  92/52    Blood Pressure (Exercise)  124/80  128/82  140/70  118/70    Blood Pressure (Exit)  110/52  108/68  110/68  98/60    Heart Rate (Admit)  76 bpm  71 bpm  72 bpm  73 bpm    Heart Rate (Exercise)  104 bpm  119 bpm  104 bpm  108 bpm    Heart Rate (Exit)  78 bpm  81 bpm  78 bpm  83 bpm    Rating of Perceived Exertion (Exercise)  13  12  12  12     Perceived Dyspnea (Exercise)  0  0  0  0    Symptoms  None  None  None  None    Comments  Pt's first day of exercise  Non  Non  None    Duration  Progress to 10 minutes continuous walking  at current work load and total walking time to 30-45 min   Progress to 10 minutes continuous walking  at current work load and total walking time to 30-45 min  Progress to 30 minutes of  aerobic without signs/symptoms of physical distress  Continue with 30 min of aerobic exercise without signs/symptoms of physical distress.    Intensity  THRR unchanged  THRR unchanged  THRR unchanged  THRR unchanged      Progression   Progression  Continue to progress workloads to maintain intensity without signs/symptoms of physical distress.  Continue to progress workloads to maintain intensity without signs/symptoms of physical distress.  Continue to progress workloads to maintain intensity without signs/symptoms of physical distress.  Continue to progress workloads to maintain intensity without signs/symptoms of physical distress.    Average METs  2.5  3.3  3.5  3.9      Resistance Training   Training Prescription  Yes  Yes  Yes  No    Weight  3lbs  3lbs  3lbs  --    Reps  10-15  10-15  10-15  --    Time  10 Minutes  10 Minutes  10 Minutes  --      Interval Training   Interval Training  No  No  No  No      Recumbant Bike   Level  2  2.5  4  5     Minutes  15  15  15  15     METs  2.7  3  3.8  4.6      NuStep   Level  2  3  4  4     SPM  85  100  100  100    Minutes  15  15  15  15     METs  2.3  3.6  3.2  3.3      Home Exercise Plan   Plans to continue exercise at  --  Home (comment) Walking  Home (comment) Walking  Home (comment) Walking    Frequency  --  Add 2 additional days to program exercise sessions.  Add 2 additional days to program exercise sessions.  Add 2 additional days to program exercise sessions.    Initial Home Exercises Provided  --  05/08/19  05/08/19  05/08/19       Exercise Comments:  Exercise Comments    Row Name 04/24/19 1327 05/08/19 1438 06/08/19 1604       Exercise Comments  Pt's first day of exercise. Pt tolerated exercise prescription well. Will continue to monitor and progress pt's workloads as tolerated.  Reviewed Home  Exercise Plan with pt. Pt is not currently exercising at home. Pt plans to start exercising 2-3 days a week for at least 10 minutes to begin with. Will follow up with pt to see if he has successfully implemented walking.  Pt is continuing to respond well to exercise prescription. Will continue to monitor and increase workloads as tolerated. Pt is exercising at home 3-4 days a week by walking.        Exercise Goals and Review:  Exercise Goals    Row Name 04/18/19 1024  Exercise Goals   Increase Physical Activity  Yes       Intervention  Provide advice, education, support and counseling about physical activity/exercise needs.;Develop an individualized exercise prescription for aerobic and resistive training based on initial evaluation findings, risk stratification, comorbidities and participant's personal goals.       Expected Outcomes  Short Term: Attend rehab on a regular basis to increase amount of physical activity.;Long Term: Add in home exercise to make exercise part of routine and to increase amount of physical activity.;Long Term: Exercising regularly at least 3-5 days a week.       Increase Strength and Stamina  Yes       Intervention  Provide advice, education, support and counseling about physical activity/exercise needs.;Develop an individualized exercise prescription for aerobic and resistive training based on initial evaluation findings, risk stratification, comorbidities and participant's personal goals.       Expected Outcomes  Short Term: Increase workloads from initial exercise prescription for resistance, speed, and METs.;Long Term: Improve cardiorespiratory fitness, muscular endurance and strength as measured by increased METs and functional capacity (6MWT);Short Term: Perform resistance training exercises routinely during rehab and add in resistance training at home       Able to understand and use rate of perceived exertion (RPE) scale  Yes       Intervention   Provide education and explanation on how to use RPE scale       Expected Outcomes  Short Term: Able to use RPE daily in rehab to express subjective intensity level;Long Term:  Able to use RPE to guide intensity level when exercising independently       Knowledge and understanding of Target Heart Rate Range (THRR)  Yes       Intervention  Provide education and explanation of THRR including how the numbers were predicted and where they are located for reference       Expected Outcomes  Long Term: Able to use THRR to govern intensity when exercising independently;Short Term: Able to state/look up THRR;Short Term: Able to use daily as guideline for intensity in rehab       Able to check pulse independently  Yes       Intervention  Provide education and demonstration on how to check pulse in carotid and radial arteries.;Review the importance of being able to check your own pulse for safety during independent exercise       Expected Outcomes  Short Term: Able to explain why pulse checking is important during independent exercise;Long Term: Able to check pulse independently and accurately       Understanding of Exercise Prescription  Yes       Intervention  Provide education, explanation, and written materials on patient's individual exercise prescription       Expected Outcomes  Short Term: Able to explain program exercise prescription;Long Term: Able to explain home exercise prescription to exercise independently          Exercise Goals Re-Evaluation : Exercise Goals Re-Evaluation    Row Name 05/08/19 1434 06/08/19 1605           Exercise Goal Re-Evaluation   Exercise Goals Review  Increase Strength and Stamina;Increase Physical Activity;Able to understand and use rate of perceived exertion (RPE) scale;Knowledge and understanding of Target Heart Rate Range (THRR);Able to check pulse independently;Understanding of Exercise Prescription  Increase Strength and Stamina;Increase Physical Activity;Able to  understand and use rate of perceived exertion (RPE) scale;Knowledge and understanding of Target Heart Rate Range (THRR);Able to check  pulse independently;Understanding of Exercise Prescription      Comments  Reviewed Home Exercise with pt. Also reviewed THRR, weather conditions, RPE Scale, endpoints of exercise, and progressing walking time. Pt verbalized understanding.  Pt is doing well with exercise. Pt is consistently progressing workloads and tolerating them well. Will continue to monitor.      Expected Outcomes  Pt will plan to walk 2-3 days for 10 minutes on nonrehab days. Will continue to monitor pt.  Pt will continue to walk 3-4 days a week for 20-30 minutes on nonrehab days.          Discharge Exercise Prescription (Final Exercise Prescription Changes): Exercise Prescription Changes - 06/07/19 1601      Response to Exercise   Blood Pressure (Admit)  92/52    Blood Pressure (Exercise)  118/70    Blood Pressure (Exit)  98/60    Heart Rate (Admit)  73 bpm    Heart Rate (Exercise)  108 bpm    Heart Rate (Exit)  83 bpm    Rating of Perceived Exertion (Exercise)  12    Perceived Dyspnea (Exercise)  0    Symptoms  None    Comments  None    Duration  Continue with 30 min of aerobic exercise without signs/symptoms of physical distress.    Intensity  THRR unchanged      Progression   Progression  Continue to progress workloads to maintain intensity without signs/symptoms of physical distress.    Average METs  3.9      Resistance Training   Training Prescription  No      Interval Training   Interval Training  No      Recumbant Bike   Level  5    Minutes  15    METs  4.6      NuStep   Level  4    SPM  100    Minutes  15    METs  3.3      Home Exercise Plan   Plans to continue exercise at  Home (comment)   Walking   Frequency  Add 2 additional days to program exercise sessions.    Initial Home Exercises Provided  05/08/19       Nutrition:  Target Goals: Understanding  of nutrition guidelines, daily intake of sodium 1500mg , cholesterol 200mg , calories 30% from fat and 7% or less from saturated fats, daily to have 5 or more servings of fruits and vegetables.  Biometrics: Pre Biometrics - 04/18/19 1025      Pre Biometrics   Height  5\' 7"  (1.702 m)    Weight  85.9 kg    Waist Circumference  42.25 inches    Hip Circumference  42 inches    Waist to Hip Ratio  1.01 %    BMI (Calculated)  29.65    Triceps Skinfold  21 mm    % Body Fat  31.2 %    Grip Strength  34 kg    Flexibility  11.25 in    Single Leg Stand  0 seconds        Nutrition Therapy Plan and Nutrition Goals: Nutrition Therapy & Goals - 05/15/19 1151      Nutrition Therapy   Diet  Heart Healthy      Personal Nutrition Goals   Nutrition Goal  Pt to build a healthy plate including vegetables, fruits, whole grains, and low-fat dairy products in a heart healthy meal plan.      Intervention  Plan   Intervention  Prescribe, educate and counsel regarding individualized specific dietary modifications aiming towards targeted core components such as weight, hypertension, lipid management, diabetes, heart failure and other comorbidities.;Nutrition handout(s) given to patient.    Expected Outcomes  Short Term Goal: Understand basic principles of dietary content, such as calories, fat, sodium, cholesterol and nutrients.       Nutrition Assessments: Nutrition Assessments - 04/26/19 1218      MEDFICTS Scores   Pre Score  70       Nutrition Goals Re-Evaluation: Nutrition Goals Re-Evaluation    Row Name 05/15/19 1152             Goals   Current Weight  189 lb (85.7 kg)       Nutrition Goal  Pt to build a healthy plate including vegetables, fruits, whole grains, and low-fat dairy products in a heart healthy meal plan.          Nutrition Goals Discharge (Final Nutrition Goals Re-Evaluation): Nutrition Goals Re-Evaluation - 05/15/19 1152      Goals   Current Weight  189 lb (85.7 kg)     Nutrition Goal  Pt to build a healthy plate including vegetables, fruits, whole grains, and low-fat dairy products in a heart healthy meal plan.       Psychosocial: Target Goals: Acknowledge presence or absence of significant depression and/or stress, maximize coping skills, provide positive support system. Participant is able to verbalize types and ability to use techniques and skills needed for reducing stress and depression.  Initial Review & Psychosocial Screening: Initial Psych Review & Screening - 04/18/19 1057      Initial Review   Current issues with  None Identified      Family Dynamics   Good Support System?  Yes    Comments  Mr. Menzel maintains a positive attitude and outlook. He relies on his daughter who is in the healthcare field and his wife for support. He enjoys woodworking and utilizes this for stress reduction if needed.      Barriers   Psychosocial barriers to participate in program  There are no identifiable barriers or psychosocial needs.      Screening Interventions   Interventions  Encouraged to exercise       Quality of Life Scores: Quality of Life - 04/18/19 1030      Quality of Life   Select  Quality of Life      Quality of Life Scores   Health/Function Pre  26.67 %    Socioeconomic Pre  28 %    Psych/Spiritual Pre  29.14 %    Family Pre  30 %    GLOBAL Pre  27.85 %      Scores of 19 and below usually indicate a poorer quality of life in these areas.  A difference of  2-3 points is a clinically meaningful difference.  A difference of 2-3 points in the total score of the Quality of Life Index has been associated with significant improvement in overall quality of life, self-image, physical symptoms, and general health in studies assessing change in quality of life.  PHQ-9: Recent Review Flowsheet Data    Depression screen [facility] 2/9 04/18/2019   Decreased Interest 0   Down, Depressed, Hopeless 0   PHQ - 2 Score 0     Interpretation of Total  Score  Total Score Depression Severity:  1-4 = Minimal depression, 5-9 = Mild depression, 10-14 = Moderate depression, 15-19 = Moderately severe depression,  20-27 = Severe depression   Psychosocial Evaluation and Intervention: Psychosocial Evaluation - 04/24/19 1200      Psychosocial Evaluation & Interventions   Interventions  Encouraged to exercise with the program and follow exercise prescription    Comments  Mr. Chattin continues to deny psychosocial barriers to participation in CR. He has a positive outlook and attitude. He has a very strong support system. He continues to enjoy woodworking as a hobby.    Expected Outcomes  Patient will continue to have a positive outlook and attitude.    Continue Psychosocial Services   No Follow up required       Psychosocial Re-Evaluation: Psychosocial Re-Evaluation    [place] Name 05/09/19 1114 06/08/19 1143           Psychosocial Re-Evaluation   Current issues with  None Identified  None Identified      Comments  Mr. Fairley continues to deny psychosocial barriers to participation in CR or self health management. He has a very strong support system including a daughter in the medical field. He enjoys wood working and walking his dog as a way to deal with stress in a healthy way. He continues to maintain a positive attitude and outlook. No interventions are needed at this time.  Mr. Lachman continues to deny psychosocial barriers to participation in CR or self health management. He has a very strong support system including a daughter in the medical field. He enjoys wood working and walking his dog as a way to deal with stress in a healthy way. He continues to maintain a positive attitude and outlook. No interventions are needed at this time.      Expected Outcomes  Patient will continue to maintain a positive outlook and attitude. He will utilize his support system as needed.  Patient will continue to maintain a positive outlook and attitude. He will  utilize his support system as needed.      Interventions  Encouraged to attend Cardiac Rehabilitation for the exercise  Encouraged to attend Cardiac Rehabilitation for the exercise      Continue Psychosocial Services   No Follow up required  No Follow up required         Psychosocial Discharge (Final Psychosocial Re-Evaluation): Psychosocial Re-Evaluation - 06/08/19 1143      Psychosocial Re-Evaluation   Current issues with  None Identified    Comments  Mr. Langsam continues to deny psychosocial barriers to participation in CR or self health management. He has a very strong support system including a daughter in the medical field. He enjoys wood working and walking his dog as a way to deal with stress in a healthy way. He continues to maintain a positive attitude and outlook. No interventions are needed at this time.    Expected Outcomes  Patient will continue to maintain a positive outlook and attitude. He will utilize his support system as needed.    Interventions  Encouraged to attend Cardiac Rehabilitation for the exercise    Continue Psychosocial Services   No Follow up required       Vocational Rehabilitation: Provide vocational rehab assistance to qualifying candidates.   Vocational Rehab Evaluation & Intervention: Vocational Rehab - 04/18/19 1057      Initial Vocational Rehab Evaluation & Intervention   Assessment shows need for Vocational Rehabilitation  No       Education: Education Goals: Education classes will be provided on a weekly basis, covering required topics. Participant will state understanding/return demonstration of topics presented.  Learning Barriers/Preferences: Learning Barriers/Preferences - 04/18/19 1057      Learning Barriers/Preferences   Learning Barriers  None       Education Topics: Hypertension, Hypertension Reduction -Define heart disease and high blood pressure. Discus how high blood pressure affects the body and ways to reduce high blood  pressure.   Exercise and Your Heart -Discuss why it is important to exercise, the FITT principles of exercise, normal and abnormal responses to exercise, and how to exercise safely.   Angina -Discuss definition of angina, causes of angina, treatment of angina, and how to decrease risk of having angina.   Cardiac Medications -Review what the following cardiac medications are used for, how they affect the body, and side effects that may occur when taking the medications.  Medications include Aspirin, Beta blockers, calcium channel blockers, ACE Inhibitors, angiotensin receptor blockers, diuretics, digoxin, and antihyperlipidemics.   Congestive Heart Failure -Discuss the definition of CHF, how to live with CHF, the signs and symptoms of CHF, and how keep track of weight and sodium intake.   Heart Disease and Intimacy -Discus the effect sexual activity has on the heart, how changes occur during intimacy as we age, and safety during sexual activity.   Smoking Cessation / COPD -Discuss different methods to quit smoking, the health benefits of quitting smoking, and the definition of COPD.   Nutrition I: Fats -Discuss the types of cholesterol, what cholesterol does to the heart, and how cholesterol levels can be controlled.   Nutrition II: Labels -Discuss the different components of food labels and how to read food label   Heart Parts/Heart Disease and PAD -Discuss the anatomy of the heart, the pathway of blood circulation through the heart, and these are affected by heart disease.   Stress I: Signs and Symptoms -Discuss the causes of stress, how stress may lead to anxiety and depression, and ways to limit stress.   Stress II: Relaxation -Discuss different types of relaxation techniques to limit stress.   Warning Signs of Stroke / TIA -Discuss definition of a stroke, what the signs and symptoms are of a stroke, and how to identify when someone is having stroke.   Knowledge  Questionnaire Score: Knowledge Questionnaire Score - 04/18/19 1028      Knowledge Questionnaire Score   Pre Score  17/24       Core Components/Risk Factors/Patient Goals at Admission: Personal Goals and Risk Factors at Admission - 04/18/19 1032      Core Components/Risk Factors/Patient Goals on Admission    Weight Management  Yes;Weight Maintenance;Weight Loss    Intervention  Weight Management: Develop a combined nutrition and exercise program designed to reach desired caloric intake, while maintaining appropriate intake of nutrient and fiber, sodium and fats, and appropriate energy expenditure required for the weight goal.;Weight Management: Provide education and appropriate resources to help participant work on and attain dietary goals.    Admit Weight  189 lb 6 oz (85.9 kg)    Expected Outcomes  Short Term: Continue to assess and modify interventions until short term weight is achieved;Long Term: Adherence to nutrition and physical activity/exercise program aimed toward attainment of established weight goal;Weight Maintenance: Understanding of the daily nutrition guidelines, which includes 25-35% calories from fat, 7% or less cal from saturated fats, less than 200mg  cholesterol, less than 1.5gm of sodium, & 5 or more servings of fruits and vegetables daily;Weight Loss: Understanding of general recommendations for a balanced deficit meal plan, which promotes 1-2 lb weight loss per week and includes  a negative energy balance of [phone] kcal/d;Understanding recommendations for meals to include 15-35% energy as protein, 25-35% energy from fat, 35-60% energy from carbohydrates, less than 200mg  of dietary cholesterol, 20-35 gm of total fiber daily;Understanding of distribution of calorie intake throughout the day with the consumption of 4-5 meals/snacks    Hypertension  Yes    Intervention  Provide education on lifestyle modifcations including regular physical activity/exercise, weight management,  moderate sodium restriction and increased consumption of fresh fruit, vegetables, and low fat dairy, alcohol moderation, and smoking cessation.;Monitor prescription use compliance.    Expected Outcomes  Short Term: Continued assessment and intervention until BP is < 140/43mm HG in hypertensive participants. < 130/15mm HG in hypertensive participants with diabetes, heart failure or chronic kidney disease.;Long Term: Maintenance of blood pressure at goal levels.    Lipids  Yes    Intervention  Provide education and support for participant on nutrition & aerobic/resistive exercise along with prescribed medications to achieve LDL 70mg , HDL >40mg .    Expected Outcomes  Short Term: Participant states understanding of desired cholesterol values and is compliant with medications prescribed. Participant is following exercise prescription and nutrition guidelines.;Long Term: Cholesterol controlled with medications as prescribed, with individualized exercise RX and with personalized nutrition plan. Value goals: LDL < 70mg , HDL > 40 mg.       Core Components/Risk Factors/Patient Goals Review:  Goals and Risk Factor Review    Row Name 04/24/19 1133 05/09/19 1116 06/08/19 1144         Core Components/Risk Factors/Patient Goals Review   Personal Goals Review  Weight Management/Obesity;Hypertension  Weight Management/Obesity;Hypertension  Weight Management/Obesity;Hypertension     Review  Patient has several CAD risk factors. He is very eager to participate in CR for modifications. He continues to take his medications as prescribed and eats a heart healthy diet.  Patient has several CAD risk factors. He continues to be very eager to participate in CR for modifications. He continues to take his medications as prescribed and eats a heart healthy diet. His hypertension is controlled with medications and diet.VSS.  [name] has done well with exercise at phase 2 cardiac rehab. [name] has had some resting systolic bP's in  the [id number] at phase 2 cardiac rehab otherwise vital signs have been stable. Pt asymptomatic     Expected Outcomes  Patient will continue to participate in CR for risk factor modification.  Patient will continue to participate in CR for risk factor modification.  Patient will continue to participate in CR for risk factor modification.        Core Components/Risk Factors/Patient Goals at Discharge (Final Review):  Goals and Risk Factor Review - 06/08/19 1144      Core Components/Risk Factors/Patient Goals Review   Personal Goals Review  Weight Management/Obesity;Hypertension    Review  [name] has done well with exercise at phase 2 cardiac rehab. [name] has had some resting systolic bP's in the [id number] at phase 2 cardiac rehab otherwise vital signs have been stable. Pt asymptomatic    Expected Outcomes  Patient will continue to participate in CR for risk factor modification.       ITP Comments: ITP Comments    Row Name 04/18/19 1051 04/24/19 1201 05/09/19 1111 06/08/19 1141     ITP Comments  Dr. Fransico Him, Medical Director [facility] Cardiac Rehab  Patient completed his first cardiac rehab exercise session today and tolerated very well. VSS. Denied complaints.  30 day ITP review: Mr. Relyea continues to participate in cardiac rehab.  VSS. Continues to deny cardiac complaints or concerns. He works to an RPE of 11-13. His weight remains stable. Per EP note, he is not currently exercising at home but has been strongly encouraged to do so. He feels his stamina and strength are improving as he is able to do more "around the hours" with better ease.  30 Day ITP Review. [name] has good participation and attendance in phase 2 cardiac rehab. [name] has had some intermittent resting BP's in the [id number] systolic. Will notify Dr Marlou Porch.       Comments: See ITP comments.Barnet Pall, RN,BSN 06/08/2019 4:36 PM

## 2019-06-09 ENCOUNTER — Encounter (HOSPITAL_COMMUNITY)
Admission: RE | Admit: 2019-06-09 | Discharge: 2019-06-09 | Disposition: A | Discharge status: Home / Self Care | Payer: Medicare PPO | Source: Ambulatory Visit | Attending: Cardiology | Admitting: Cardiology

## 2019-06-09 ENCOUNTER — Other Ambulatory Visit: Payer: Self-pay

## 2019-06-09 DIAGNOSIS — I214 Non-ST elevation (NSTEMI) myocardial infarction: Secondary | ICD-10-CM | POA: Diagnosis not present

## 2019-06-09 DIAGNOSIS — Z955 Presence of coronary angioplasty implant and graft: Secondary | ICD-10-CM

## 2019-06-12 ENCOUNTER — Other Ambulatory Visit: Payer: Self-pay

## 2019-06-12 ENCOUNTER — Encounter (HOSPITAL_COMMUNITY)
Admission: RE | Admit: 2019-06-12 | Discharge: 2019-06-12 | Disposition: A | Discharge status: Home / Self Care | Payer: Medicare PPO | Source: Ambulatory Visit | Attending: Cardiology | Admitting: Cardiology

## 2019-06-12 DIAGNOSIS — I214 Non-ST elevation (NSTEMI) myocardial infarction: Secondary | ICD-10-CM

## 2019-06-12 DIAGNOSIS — Z955 Presence of coronary angioplasty implant and graft: Secondary | ICD-10-CM

## 2019-06-13 ENCOUNTER — Ambulatory Visit: Payer: Medicare PPO | Admitting: Cardiology

## 2019-06-13 ENCOUNTER — Encounter: Payer: Self-pay | Admitting: *Deleted

## 2019-06-13 ENCOUNTER — Encounter: Payer: Self-pay | Admitting: Cardiology

## 2019-06-13 VITALS — BP 110/60 | HR 60 | Ht 69.0 in | Wt 187.6 lb

## 2019-06-13 DIAGNOSIS — I214 Non-ST elevation (NSTEMI) myocardial infarction: Secondary | ICD-10-CM

## 2019-06-13 DIAGNOSIS — I472 Ventricular tachycardia: Secondary | ICD-10-CM

## 2019-06-13 DIAGNOSIS — R55 Syncope and collapse: Secondary | ICD-10-CM

## 2019-06-13 DIAGNOSIS — R000 Tachycardia, unspecified: Secondary | ICD-10-CM

## 2019-06-13 DIAGNOSIS — I2510 Atherosclerotic heart disease of native coronary artery without angina pectoris: Secondary | ICD-10-CM

## 2019-06-13 LAB — LIPID PANEL
Chol/HDL Ratio: 2.4 ratio (ref 0.0–5.0)
Cholesterol, Total: 100 mg/dL (ref 100–199)
HDL: 41 mg/dL (ref >39)
LDL Chol Calc (NIH): 32 mg/dL (ref 0–99)
Triglycerides: 161 mg/dL — ABNORMAL HIGH (ref 0–149)
VLDL Cholesterol Cal: 27 mg/dL (ref 5–40)

## 2019-06-13 NOTE — Progress Notes (Signed)
Cardiology Office Note:    Date:  06/13/2019   ID:  Nicholas Webb, DOB October 25, 1940, MRN [id number]  PCP:  Maurice Small, MD  Cardiologist:  Candee Furbish, MD  Electrophysiologist:  None   Referring MD: Maurice Small, MD     History of Present Illness:    Nicholas Webb is a 79 y.o. male here for coronary disease follow-up.  Stent placement LAD on 06/24/2018.  This was complicated overall by skull mass.  Biopsy was canceled.  Possible infection.  Skull mass decreased in size.  His daughter once again Mickel Baas is Designer, jewellery at [place] office.  He has been noticing some decreased blood pressures during cardiac rehab.  Sometimes they are in the upper 0000000 systolic.  He is on low-dose Toprol.  He did have a syncopal episode then pulmonary embolism was discovered.  No heart strain.  Since his stent placement in April 2020 he had been on both Eliquis and Plavix.  His wife was concerned with some mild memory impairment.  Overall he has been doing quite well.  Please see below for details.  Past Medical History:  Diagnosis Date  . Anemia of chronic disease   . Atherosclerosis of both carotid arteries   . Barrett's esophagus   . Dizziness   . GERD (gastroesophageal reflux disease)   . Hx of adenomatous colonic polyps   . Hyperlipidemia   . Macrocytosis without anemia   . Obesity   . OSA (obstructive sleep apnea)   . Rheumatoid arthritis(714.0)   . Syncope     Past Surgical History:  Procedure Laterality Date  . CORONARY STENT INTERVENTION N/A 06/24/2018   Procedure: CORONARY STENT INTERVENTION;  Surgeon: Burnell Blanks, MD;  Location: [facility];  Service: Cardiovascular;  Laterality: N/A;  . IR IVC FILTER PLMT / S&I [facility] Keels GUID/MOD SED  10/07/2018  . IR RADIOLOGIST EVAL & MGMT  09/27/2018  . IR RADIOLOGIST EVAL & MGMT  01/04/2019  . JOINT REPLACEMENT  2004   left  . LEFT HEART CATH AND CORONARY ANGIOGRAPHY N/A 06/24/2018   Procedure: LEFT HEART CATH AND CORONARY  ANGIOGRAPHY;  Surgeon: Burnell Blanks, MD;  Location: [facility];  Service: Cardiovascular;  Laterality: N/A;    Current Medications: Current Meds  Medication Sig  . atorvastatin (LIPITOR) 80 MG tablet Take 1 tablet (80 mg total) by mouth daily at 6 PM.  . clopidogrel (PLAVIX) 75 MG tablet Take 1 tablet (75 mg total) by mouth daily. Take (2) tablets the first day then (1) tablet daily thereafter  . ELIQUIS 5 MG TABS tablet TAKE 1 TABLET(5 MG) BY MOUTH TWICE DAILY  . esomeprazole (NEXIUM) 20 MG capsule Take 20 mg by mouth daily at 12 noon.   . fluorouracil (EFUDEX) 5 % cream Apply topically daily. To scalp  . folic acid (FOLVITE) 1 MG tablet Take 1 mg by mouth daily. Per patient taking 3 tablets daily  . methotrexate (RHEUMATREX) 2.5 MG tablet Take 2.5 mg by mouth once a week. Caution:Chemotherapy. Protect from light. Per patient taking 4 pills on Sunday and 4 pills on Monday  . metoprolol succinate (TOPROL-XL) 25 MG 24 hr tablet Take 25 mg by mouth daily. Per patient taking 12.5 mg daily  . Multiple Vitamins-Minerals (MULTIVITAMIN WITH MINERALS) tablet Take 1 tablet by mouth daily.     Allergies:   Patient has no known allergies.   Social History   Socioeconomic History  . Marital status: Married    Spouse  name: Nicholas Webb  . Number of children: 2  . Years of education: Not on file  . Highest education level: Bachelor's degree (e.g., BA, AB, BS)  Occupational History    Employer: [employer]  Tobacco Use  . Smoking status: Never Smoker  . Smokeless tobacco: Never Used  Substance and Sexual Activity  . Alcohol use: Yes    Alcohol/week: 1.0 standard drinks    Types: 1 Glasses of wine per week    Comment: OCCASION  . Drug use: No  . Sexual activity: Not on file  Other Topics Concern  . Not on file  Social History Narrative   Patient is right-handed.  He lives with his wife in a one level home. He does not exercise.    Social Determinants of Health    Financial Resource Strain: Low Risk   . Difficulty of Paying Living Expenses: Not hard at all  Food Insecurity: No Food Insecurity  . Worried About Charity fundraiser in the Last Year: Never true  . Ran Out of Food in the Last Year: Never true  Transportation Needs: No Transportation Needs  . Lack of Transportation (Medical): No  . Lack of Transportation (Non-Medical): No  Physical Activity: Inactive  . Days of Exercise per Week: 0 days  . Minutes of Exercise per Session: 0 min  Stress: No Stress Concern Present  . Feeling of Stress : Not at all  Social Connections: Slightly Isolated  . Frequency of Communication with Friends and Family: Three times a week  . Frequency of Social Gatherings with Friends and Family: Once a week  . Attends Religious Services: More than 4 times per year  . Active Member of Clubs or Organizations: No  . Attends Archivist Meetings: Not on file  . Marital Status: Married     Family History: The patient's family history includes Cancer in his father; Colon cancer in his father; Glaucoma in his mother; Prostate cancer in his father; Stroke in his mother.  ROS:   Please see the history of present illness.    No bleeding no falls no syncope no chest pain all other systems reviewed and are negative.  EKGs/Labs/Other Studies Reviewed:      EKG:  EKG is  ordered today.  The ekg ordered today demonstrates right bundle branch block 60 sinus rhythm no significant change from prior  Recent Labs: 06/23/2018: B Natriuretic Peptide 314.2; TSH 2.365 06/24/2018: ALT 49; Magnesium 1.9 10/07/2018: BUN 21; Creatinine, Ser 1.01; Hemoglobin 12.8; Platelets 146; Potassium 4.1; Sodium 140  Recent Lipid Panel    Component Value Date/Time   CHOL 155 06/23/2018 1933   TRIG 36 06/23/2018 1933   HDL 45 06/23/2018 1933   CHOLHDL 3.4 06/23/2018 1933   VLDL 7 06/23/2018 1933   LDLCALC 103 (H) 06/23/2018 1933    Physical Exam:    VS:  BP 110/60   Pulse 60    Ht 5\' 9"  (1.753 m)   Wt 187 lb 9.6 oz (85.1 kg)   SpO2 97%   BMI 27.70 kg/m     Wt Readings from Last 3 Encounters:  06/13/19 187 lb 9.6 oz (85.1 kg)  06/08/19 187 lb (84.8 kg)  05/15/19 189 lb (85.7 kg)     GEN:  Well nourished, well developed in no acute distress HEENT: Normal NECK: No JVD; No carotid bruits LYMPHATICS: No lymphadenopathy CARDIAC: RRR, no murmurs, rubs, gallops RESPIRATORY:  Clear to auscultation without rales, wheezing or rhonchi  ABDOMEN: Soft,  non-tender, non-distended MUSCULOSKELETAL:  No edema; No deformity  SKIN: Warm and dry NEUROLOGIC:  Alert and oriented x 3 PSYCHIATRIC:  Normal affect   ASSESSMENT:    1. NSTEMI (non-ST elevated myocardial infarction) ([place])   2. Coronary artery disease involving native coronary artery of native heart without angina pectoris   3. Wide-complex tachycardia ([place])   4. Syncope, unspecified syncope type    PLAN:    In order of problems listed above:  Coronary artery disease -PCI to LAD 06/24/2018.  Since this has been 1 year, we will discontinue the Plavix and continue with Eliquis for bilateral PE.  Overall doing well without any anginal symptoms. -Creatinine 1.0 potassium 3.9 ALT 24, hemoglobin 12.1  Hyperlipidemia -On atorvastatin 80 mg-prior LDL 103 in April 2020.  We will repeat.  Bilateral pulmonary embolism/DVT -Continue Eliquis.  Oncology following. Getting IVC filter removed soon. Continue   Brief wide-complex tachycardia of 8 beats -Previously been on low-dose beta-blocker Toprol-XL 12.5 mg a day his EF is normal.  He is asymptomatic with this.  His blood pressure has been a little bit low, therefore we will go ahead and discontinue the Toprol  Syncope -Previously has seen Dr. Curt Bears.  It is possible that large clot burden because syncope.  PE that is.  Skull lesion regressed.  May have been an infection.  Dr. Sherley Bounds with neurosurgery has released him.  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  I have given him a note.   Medication Adjustments/Labs and Tests Ordered: Current medicines are reviewed at length with the patient today.  Concerns regarding medicines are outlined above.  Orders Placed This Encounter  Procedures  . Lipid panel  . EKG 12-Lead   No orders of the defined types were placed in this encounter.   Patient Instructions  Your physician has recommended you make the following change in your medication: STOP CLOPIDOGREL AND  METOPROLOL   Your physician recommends that you return for lab work in:  [place] wants you to follow-up in: [place] will receive a reminder letter in the mail two months in advance. If you don't receive a letter, please call our office to schedule the follow-up appointment.  IF B/P REMAINS  LOW CAN INCREASE FLUID INTAKE DRINK (GATORADE) AND SALT INTAKE     Signed, Candee Furbish, MD  06/13/2019 11:04 AM    [place]

## 2019-06-13 NOTE — Patient Instructions (Addendum)
Your physician has recommended you make the following change in your medication: STOP CLOPIDOGREL AND  METOPROLOL   Your physician recommends that you return for lab work in:  [place] wants you to follow-up in: [place] will receive a reminder letter in the mail two months in advance. If you don't receive a letter, please call our office to schedule the follow-up appointment.  IF B/P REMAINS  LOW CAN INCREASE FLUID INTAKE DRINK (GATORADE) AND SALT INTAKE

## 2019-06-14 ENCOUNTER — Other Ambulatory Visit: Payer: Self-pay | Admitting: Radiology

## 2019-06-14 ENCOUNTER — Other Ambulatory Visit: Payer: Self-pay | Admitting: Student

## 2019-06-14 ENCOUNTER — Encounter (HOSPITAL_COMMUNITY)
Admission: RE | Admit: 2019-06-14 | Discharge: 2019-06-14 | Disposition: A | Discharge status: Home / Self Care | Payer: Medicare PPO | Source: Ambulatory Visit | Attending: Cardiology | Admitting: Cardiology

## 2019-06-14 ENCOUNTER — Other Ambulatory Visit: Payer: Self-pay

## 2019-06-14 ENCOUNTER — Telehealth: Payer: Self-pay | Admitting: *Deleted

## 2019-06-14 DIAGNOSIS — I214 Non-ST elevation (NSTEMI) myocardial infarction: Secondary | ICD-10-CM

## 2019-06-14 DIAGNOSIS — Z955 Presence of coronary angioplasty implant and graft: Secondary | ICD-10-CM

## 2019-06-14 MED ORDER — ATORVASTATIN CALCIUM 40 MG PO TABS: 40.0000 mg | ORAL_TABLET | Freq: Every day | ORAL | 3 refills | Status: DC

## 2019-06-14 NOTE — Telephone Encounter (Signed)
I spoke with patient and reviewed results with him. He asked about having results emailed to him. I explained we could not email but he could sign up for my chart or results could be sent to him in the mail.  He would like to sign up for my chart but reports he does not have code to do this.  He requests code be sent through text message.  Code sent via my chart administration via text message. Patient requests information from Dr Marlou Porch be sent to him through my chart once he has signed up.  Patient is currently at an appointment and had to end conversation. I was unable to confirm if he wanted to decrease medication.  I asked him to call us back later to discuss change.

## 2019-06-14 NOTE — Progress Notes (Signed)
Discharge Progress Report  Patient Details  Name: Nicholas Webb MRN: [id number] Date of Birth: Oct 23, 1940 Referring Provider:     CARDIAC REHAB PHASE II ORIENTATION from 04/18/2019 in [place]  Referring Provider  Dr. Marlou Porch       Number of Visits: 22  Reason for Discharge:  Patient reached a stable level of exercise. Patient independent in their exercise. Patient has met program and personal goals.  Smoking History:  Social History   Tobacco Use  Smoking Status Never Smoker  Smokeless Tobacco Never Used    Diagnosis:  NSTEMI (non-ST elevated myocardial infarction) ([place])  Status post coronary artery stent placement  ADL UCSD:   Initial Exercise Prescription: Initial Exercise Prescription - 04/18/19 1000      Date of Initial Exercise RX and Referring Provider   Date  04/18/19    Referring Provider  Dr. Marlou Porch    Expected Discharge Date  06/16/19      Recumbant Bike   Level  2    Watts  20    Minutes  15    METs  2.74      NuStep   Level  2    SPM  85    Minutes  15    METs  2.6      Prescription Details   Frequency (times per week)  3    Duration  Progress to 30 minutes of continuous aerobic without signs/symptoms of physical distress      Intensity   THRR 40-80% of Max Heartrate  57-114    Ratings of Perceived Exertion  11-13    Perceived Dyspnea  0-4      Progression   Progression  Continue to progress workloads to maintain intensity without signs/symptoms of physical distress.      Resistance Training   Training Prescription  Yes    Weight  3 lbs.     Reps  10-15       Discharge Exercise Prescription (Final Exercise Prescription Changes): Exercise Prescription Changes - 06/14/19 1325      Response to Exercise   Blood Pressure (Admit)  108/60    Blood Pressure (Exercise)  142/70    Blood Pressure (Exit)  118/72    Heart Rate (Admit)  73 bpm    Heart Rate (Exercise)  115 bpm    Heart Rate (Exit)  83  bpm    Rating of Perceived Exertion (Exercise)  12    Perceived Dyspnea (Exercise)  0    Symptoms  None    Comments  Pt graduated from Cardiac Rehab    Duration  Continue with 30 min of aerobic exercise without signs/symptoms of physical distress.    Intensity  THRR unchanged      Progression   Progression  Continue to progress workloads to maintain intensity without signs/symptoms of physical distress.    Average METs  3.9      Resistance Training   Training Prescription  Yes    Weight  3lbs    Reps  10-15    Time  10 Minutes      Interval Training   Interval Training  No      Recumbant Bike   Level  5    Minutes  15    METs  4.3      NuStep   Level  4    SPM  100    Minutes  15    METs  3.5  Home Exercise Plan   Plans to continue exercise at  Home (comment)   Walking & Silversneakers   Frequency  Add 4 additional days to program exercise sessions.    Initial Home Exercises Provided  05/08/19       Functional Capacity: 6 Minute Walk    Row Name 04/18/19 1019 06/12/19 1229       6 Minute Walk   Phase  Initial  Discharge    Distance  1324 feet  1442 feet    Distance % Change  --  8.91 %    Distance Feet Change  --  118 ft    Walk Time  6 minutes  6 minutes    # of Rest Breaks  0  0    MPH  2.5  2.73    METS  2.6  2.7    RPE  13  12    Perceived Dyspnea   0  0    VO2 Peak  9.29  9.44    Symptoms  No  No    Resting HR  92 bpm  72 bpm    Resting BP  100/58  100/62    Resting Oxygen Saturation   96 %  97 %    Exercise Oxygen Saturation  during 6 min walk  95 %  95 %    Max Ex. HR  120 bpm  109 bpm    Max Ex. BP  134/68  128/74    2 Minute Post BP  106/60  112/64       Psychological, QOL, Others - Outcomes: PHQ 2/9: Depression screen [facility] 2/9 06/14/2019 04/18/2019  Decreased Interest 0 0  Down, Depressed, Hopeless 0 0  PHQ - 2 Score 0 0    Quality of Life: Quality of Life - 06/14/19 0743      Quality of Life Scores   Health/Function Pre   26.67 %    Health/Function Post  25.47 %    Health/Function % Change  -4.5 %    Socioeconomic Pre  28 %    Socioeconomic Post  28 %    Socioeconomic % Change   0 %    Psych/Spiritual Pre  29.14 %    Psych/Spiritual Post  30 %    Psych/Spiritual % Change  2.95 %    Family Pre  30 %    Family Post  26.7 %    Family % Change  -11 %    GLOBAL Pre  27.85 %    GLOBAL Post  27.08 %    GLOBAL % Change  -2.76 %       Personal Goals: Goals established at orientation with interventions provided to work toward goal. Personal Goals and Risk Factors at Admission - 04/18/19 1032      Core Components/Risk Factors/Patient Goals on Admission    Weight Management  Yes;Weight Maintenance;Weight Loss    Intervention  Weight Management: Develop a combined nutrition and exercise program designed to reach desired caloric intake, while maintaining appropriate intake of nutrient and fiber, sodium and fats, and appropriate energy expenditure required for the weight goal.;Weight Management: Provide education and appropriate resources to help participant work on and attain dietary goals.    Admit Weight  189 lb 6 oz (85.9 kg)    Expected Outcomes  Short Term: Continue to assess and modify interventions until short term weight is achieved;Long Term: Adherence to nutrition and physical activity/exercise program aimed toward attainment of established weight goal;Weight Maintenance: Understanding  of the daily nutrition guidelines, which includes 25-35% calories from fat, 7% or less cal from saturated fats, less than 229m cholesterol, less than 1.5gm of sodium, & 5 or more servings of fruits and vegetables daily;Weight Loss: Understanding of general recommendations for a balanced deficit meal plan, which promotes 1-2 lb weight loss per week and includes a negative energy balance of [id number] kcal/d;Understanding recommendations for meals to include 15-35% energy as protein, 25-35% energy from fat, 35-60% energy from  carbohydrates, less than 2035mof dietary cholesterol, 20-35 gm of total fiber daily;Understanding of distribution of calorie intake throughout the day with the consumption of 4-5 meals/snacks    Hypertension  Yes    Intervention  Provide education on lifestyle modifcations including regular physical activity/exercise, weight management, moderate sodium restriction and increased consumption of fresh fruit, vegetables, and low fat dairy, alcohol moderation, and smoking cessation.;Monitor prescription use compliance.    Expected Outcomes  Short Term: Continued assessment and intervention until BP is < 140/909mG in hypertensive participants. < 130/28m73m in hypertensive participants with diabetes, heart failure or chronic kidney disease.;Long Term: Maintenance of blood pressure at goal levels.    Lipids  Yes    Intervention  Provide education and support for participant on nutrition & aerobic/resistive exercise along with prescribed medications to achieve LDL <70mg76mL >40mg.1mExpected Outcomes  Short Term: Participant states understanding of desired cholesterol values and is compliant with medications prescribed. Participant is following exercise prescription and nutrition guidelines.;Long Term: Cholesterol controlled with medications as prescribed, with individualized exercise RX and with personalized nutrition plan. Value goals: LDL < 70mg, 90m> 40 mg.        Personal Goals Discharge: Goals and Risk Factor Review    Row Name 04/24/19 1133 05/09/19 1116 06/08/19 1144 07/11/19 1332       Core Components/Risk Factors/Patient Goals Review   Personal Goals Review  Weight Management/Obesity;Hypertension  Weight Management/Obesity;Hypertension  Weight Management/Obesity;Hypertension  Weight Management/Obesity;Hypertension    Review  Patient has several CAD risk factors. He is very eager to participate in CR for modifications. He continues to take his medications as prescribed and eats a heart  healthy diet.  Patient has several CAD risk factors. He continues to be very eager to participate in CR for modifications. He continues to take his medications as prescribed and eats a heart healthy diet. His hypertension is controlled with medications and diet.VSS.  Nicholas Webb has done well with exercise at phase 2 cardiac rehab. Nicholas Webb has had some resting systolic bP's in the 90's at04'Vse 2 cardiac rehab otherwise vital signs have been stable. Pt asymptomatic  Nicholas Webb did well with exercise at cardiac rehab. Nicholas Webb graduated from phase 2 cardiac rehab on 06/14/19.    Expected Outcomes  Patient will continue to participate in CR for risk factor modification.  Patient will continue to participate in CR for risk factor modification.  Patient will continue to participate in CR for risk factor modification.  Nicholas Webb plans to continue exercise by walking and attending [facility] [employer] [facility] the [employer]. [facility] plans to continue to follow nutrition and lifestyle modifications       Exercise Goals and Review: Exercise Goals    Row Name 04/18/19 1024             Exercise Goals   Increase Physical Activity  Yes       Intervention  Provide advice, education, support and counseling about physical activity/exercise needs.;Develop an individualized exercise prescription for aerobic and resistive  training based on initial evaluation findings, risk stratification, comorbidities and participant's personal goals.       Expected Outcomes  Short Term: Attend rehab on a regular basis to increase amount of physical activity.;Long Term: Add in home exercise to make exercise part of routine and to increase amount of physical activity.;Long Term: Exercising regularly at least 3-5 days a week.       Increase Strength and Stamina  Yes       Intervention  Provide advice, education, support and counseling about physical activity/exercise needs.;Develop an individualized exercise prescription for aerobic and resistive training based on initial  evaluation findings, risk stratification, comorbidities and participant's personal goals.       Expected Outcomes  Short Term: Increase workloads from initial exercise prescription for resistance, speed, and METs.;Long Term: Improve cardiorespiratory fitness, muscular endurance and strength as measured by increased METs and functional capacity (6MWT);Short Term: Perform resistance training exercises routinely during rehab and add in resistance training at home       Able to understand and use rate of perceived exertion (RPE) scale  Yes       Intervention  Provide education and explanation on how to use RPE scale       Expected Outcomes  Short Term: Able to use RPE daily in rehab to express subjective intensity level;Long Term:  Able to use RPE to guide intensity level when exercising independently       Knowledge and understanding of Target Heart Rate Range (THRR)  Yes       Intervention  Provide education and explanation of THRR including how the numbers were predicted and where they are located for reference       Expected Outcomes  Long Term: Able to use THRR to govern intensity when exercising independently;Short Term: Able to state/look up THRR;Short Term: Able to use daily as guideline for intensity in rehab       Able to check pulse independently  Yes       Intervention  Provide education and demonstration on how to check pulse in carotid and radial arteries.;Review the importance of being able to check your own pulse for safety during independent exercise       Expected Outcomes  Short Term: Able to explain why pulse checking is important during independent exercise;Long Term: Able to check pulse independently and accurately       Understanding of Exercise Prescription  Yes       Intervention  Provide education, explanation, and written materials on patient's individual exercise prescription       Expected Outcomes  Short Term: Able to explain program exercise prescription;Long Term: Able to  explain home exercise prescription to exercise independently          Exercise Goals Re-Evaluation: Exercise Goals Re-Evaluation    Row Name 05/08/19 1434 06/08/19 1605 07/05/19 0723         Exercise Goal Re-Evaluation   Exercise Goals Review  Increase Strength and Stamina;Increase Physical Activity;Able to understand and use rate of perceived exertion (RPE) scale;Knowledge and understanding of Target Heart Rate Range (THRR);Able to check pulse independently;Understanding of Exercise Prescription  Increase Strength and Stamina;Increase Physical Activity;Able to understand and use rate of perceived exertion (RPE) scale;Knowledge and understanding of Target Heart Rate Range (THRR);Able to check pulse independently;Understanding of Exercise Prescription  Increase Strength and Stamina;Increase Physical Activity;Able to understand and use rate of perceived exertion (RPE) scale;Knowledge and understanding of Target Heart Rate Range (THRR);Able to check pulse independently;Understanding of Exercise  Prescription     Comments  Reviewed Home Exercise with pt. Also reviewed THRR, weather conditions, RPE Scale, endpoints of exercise, and progressing walking time. Pt verbalized understanding.  Pt is doing well with exercise. Pt is consistently progressing workloads and tolerating them well. Will continue to monitor.  Pt completed 22 sessions of Cardiac Rehab. Pt improved functinal capacity by 8.91%. Pt was able to progress workloads to level 4 on the Nustep and level 5 on the Bike. Pt states he is feeling stronger is confident in his plan for exercise.     Expected Outcomes  Pt will plan to walk 2-3 days for 10 minutes on nonrehab days. Will continue to monitor pt.  Pt will continue to walk 3-4 days a week for 20-30 minutes on nonrehab days.  Pt will continue to exercise most days of the week. Pt plans to continue to walk 2-3 days a week for 30-45 minutes and attend [employer] at local [employer] 3 days a week.         Nutrition & Weight - Outcomes: Pre Biometrics - 04/18/19 1025      Pre Biometrics   Height  5' 7"  (1.702 m)    Weight  85.9 kg    Waist Circumference  42.25 inches    Hip Circumference  42 inches    Waist to Hip Ratio  1.01 %    BMI (Calculated)  29.65    Triceps Skinfold  21 mm    % Body Fat  31.2 %    Grip Strength  34 kg    Flexibility  11.25 in    Single Leg Stand  0 seconds        Nutrition: Nutrition Therapy & Goals - 05/15/19 1151      Nutrition Therapy   Diet  Heart Healthy      Personal Nutrition Goals   Nutrition Goal  Pt to build a healthy plate including vegetables, fruits, whole grains, and low-fat dairy products in a heart healthy meal plan.      Intervention Plan   Intervention  Prescribe, educate and counsel regarding individualized specific dietary modifications aiming towards targeted core components such as weight, hypertension, lipid management, diabetes, heart failure and other comorbidities.;Nutrition handout(s) given to patient.    Expected Outcomes  Short Term Goal: Understand basic principles of dietary content, such as calories, fat, sodium, cholesterol and nutrients.       Nutrition Discharge: Nutrition Assessments - 07/05/19 0907      MEDFICTS Scores   Post Score  56       Education Questionnaire Score: Knowledge Questionnaire Score - 06/14/19 0741      Knowledge Questionnaire Score   Pre Score  17/24    Post Score  19/24       Goals reviewed with patient; copy given to patient.Nicholas Webb graduated from cardiac rehab program today with completion of 22 exercise sessions in Phase II. Pt maintained good attendance and progressed nicely during his participation in rehab as evidenced by increased MET level.   Medication list reconciled. Repeat  PHQ score-0  .  Pt has made significant lifestyle changes and should be commended for his success. Pt feels he has achieved his goals during cardiac rehab. Nicholas Webb increased his distance on his post  exercise walk test and maintained his weight.  Pt plans to continue exercise by walking 2-3 days a week and participating in [employer] 3-4 days a week. We are proud of Nicholas Webb progress.Barnet Pall, RN,BSN 07/11/2019 1:42 PM

## 2019-06-14 NOTE — Telephone Encounter (Signed)
I spoke with patient and he would like to decrease Atorvastatin to 40 mg daily. He will cut 80 mg tablets in half to use current supply and I will send 90 day prescription for 40 mg to [facility] on [address] and [address]. Patient requests we send result message from Dr Marlou Porch to him once he has signed up for my chart.

## 2019-06-14 NOTE — Telephone Encounter (Signed)
-----   Message from Jerline Pain, MD sent at 06/14/2019 12:59 PM EDT ----- LDL 32.  I would be willing to decrease his atorvastatin to 40 mg once a day from 80.  Remember goal LDL is 70. Candee Furbish, MD

## 2019-06-15 ENCOUNTER — Ambulatory Visit (HOSPITAL_COMMUNITY)
Admission: RE | Admit: 2019-06-15 | Discharge: 2019-06-15 | Disposition: A | Discharge status: Home / Self Care | Payer: Medicare PPO | Source: Ambulatory Visit | Attending: Diagnostic Radiology | Admitting: Diagnostic Radiology

## 2019-06-15 DIAGNOSIS — E669 Obesity, unspecified: Secondary | ICD-10-CM | POA: Insufficient documentation

## 2019-06-15 DIAGNOSIS — Z95828 Presence of other vascular implants and grafts: Secondary | ICD-10-CM

## 2019-06-15 DIAGNOSIS — Z7901 Long term (current) use of anticoagulants: Secondary | ICD-10-CM | POA: Diagnosis not present

## 2019-06-15 DIAGNOSIS — Z7902 Long term (current) use of antithrombotics/antiplatelets: Secondary | ICD-10-CM | POA: Insufficient documentation

## 2019-06-15 DIAGNOSIS — M069 Rheumatoid arthritis, unspecified: Secondary | ICD-10-CM | POA: Insufficient documentation

## 2019-06-15 DIAGNOSIS — Z79899 Other long term (current) drug therapy: Secondary | ICD-10-CM | POA: Diagnosis not present

## 2019-06-15 DIAGNOSIS — Z86711 Personal history of pulmonary embolism: Secondary | ICD-10-CM | POA: Insufficient documentation

## 2019-06-15 DIAGNOSIS — E785 Hyperlipidemia, unspecified: Secondary | ICD-10-CM | POA: Insufficient documentation

## 2019-06-15 DIAGNOSIS — Z6827 Body mass index (BMI) 27.0-27.9, adult: Secondary | ICD-10-CM | POA: Diagnosis not present

## 2019-06-15 DIAGNOSIS — G4733 Obstructive sleep apnea (adult) (pediatric): Secondary | ICD-10-CM | POA: Insufficient documentation

## 2019-06-15 DIAGNOSIS — K219 Gastro-esophageal reflux disease without esophagitis: Secondary | ICD-10-CM | POA: Diagnosis not present

## 2019-06-15 DIAGNOSIS — Z452 Encounter for adjustment and management of vascular access device: Secondary | ICD-10-CM | POA: Insufficient documentation

## 2019-06-15 HISTORY — PX: IR IVC FILTER RETRIEVAL / S&I /IMG GUID/MOD SED: IMG5308

## 2019-06-15 LAB — POCT I-STAT CREATININE: Creatinine, Ser: 0.9 mg/dL (ref 0.61–1.24)

## 2019-06-15 MED ORDER — MIDAZOLAM HCL 2 MG/2ML IJ SOLN
INTRAMUSCULAR | Status: AC
  Filled 2019-06-15: qty 2

## 2019-06-15 MED ORDER — SODIUM CHLORIDE 0.9 % IV SOLN: INTRAVENOUS | Status: DC

## 2019-06-15 MED ORDER — MIDAZOLAM HCL 2 MG/2ML IJ SOLN
INTRAMUSCULAR | Status: AC | PRN
  Administered 2019-06-15: 1 mg via INTRAVENOUS
  Administered 2019-06-15 (×2): 0.5 mg via INTRAVENOUS

## 2019-06-15 MED ORDER — IOHEXOL 300 MG/ML  SOLN
100.0000 mL | Freq: Once | INTRAMUSCULAR | Status: AC | PRN
  Administered 2019-06-15: 40 mL via INTRAVENOUS

## 2019-06-15 MED ORDER — FENTANYL CITRATE (PF) 100 MCG/2ML IJ SOLN
INTRAMUSCULAR | Status: AC
  Filled 2019-06-15: qty 2

## 2019-06-15 MED ORDER — LIDOCAINE HCL 1 % IJ SOLN
INTRAMUSCULAR | Status: AC | PRN
  Administered 2019-06-15: 5 mL

## 2019-06-15 MED ORDER — LIDOCAINE HCL 1 % IJ SOLN
INTRAMUSCULAR | Status: AC
  Filled 2019-06-15: qty 20

## 2019-06-15 MED ORDER — FENTANYL CITRATE (PF) 100 MCG/2ML IJ SOLN
INTRAMUSCULAR | Status: AC | PRN
  Administered 2019-06-15 (×4): 25 ug via INTRAVENOUS

## 2019-06-15 NOTE — Progress Notes (Signed)
Patient and wife given discharge instructions. Both verbalized understanding.  

## 2019-06-15 NOTE — Sedation Documentation (Signed)
Patient is resting comfortably. 

## 2019-06-15 NOTE — H&P (Signed)
Referring Physician(s): Jones,D  Supervising Physician: Markus Daft  Patient Status:  [facility] OP  Chief Complaint: "I'm getting my filter out"  Subjective: Patient familiar to IR service from IVC filter placement on 10/07/2018.  He has a history of pulmonary embolism ,skull lesion and mediastinal adenopathy and was previously scheduled to undergo a calvarial biopsy and needed pulmonary embolism prophylaxis prior to the procedure.  He is on chronic anticoagulation for his PE.  Prior to planned calvarial biopsy he underwent MRI that suggested calvarial lesion was regressing and therefore never required surgery.  Latest CT on 01/31/2019 revealed further decrease in size of calvarial lesion.  He was evaluated by Dr. Anselm Pancoast on 01/04/2019 and deemed an appropriate candidate for IVC filter retrieval.  Lower extremity venous Doppler study on 01/04/2019 was negative for DVT. He presents today for IVC filter retrieval.   He is currently asymptomatic.  Past Medical History:  Diagnosis Date  . Anemia of chronic disease   . Atherosclerosis of both carotid arteries   . Barrett's esophagus   . Dizziness   . GERD (gastroesophageal reflux disease)   . Hx of adenomatous colonic polyps   . Hyperlipidemia   . Macrocytosis without anemia   . Obesity   . OSA (obstructive sleep apnea)   . Rheumatoid arthritis(714.0)   . Syncope    Past Surgical History:  Procedure Laterality Date  . CORONARY STENT INTERVENTION N/A 06/24/2018   Procedure: CORONARY STENT INTERVENTION;  Surgeon: Burnell Blanks, MD;  Location: [place] CV LAB;  Service: Cardiovascular;  Laterality: N/A;  . IR IVC FILTER PLMT / S&I Burke Keels GUID/MOD SED  10/07/2018  . IR RADIOLOGIST EVAL & MGMT  09/27/2018  . IR RADIOLOGIST EVAL & MGMT  01/04/2019  . JOINT REPLACEMENT  2004   left  . LEFT HEART CATH AND CORONARY ANGIOGRAPHY N/A 06/24/2018   Procedure: LEFT HEART CATH AND CORONARY ANGIOGRAPHY;  Surgeon: Burnell Blanks, MD;  Location: [place] CV LAB;  Service: Cardiovascular;  Laterality: N/A;     Allergies: Patient has no known allergies.  Medications: Prior to Admission medications   Medication Sig Start Date End Date Taking? Authorizing Provider  atorvastatin (LIPITOR) 40 MG tablet Take 1 tablet (40 mg total) by mouth daily. 06/14/19  Yes Skains, Thana Farr, MD  ELIQUIS 5 MG TABS tablet TAKE 1 TABLET(5 MG) BY MOUTH TWICE DAILY Patient taking differently: Take 5 mg by mouth 2 (two) times daily.  04/27/19  Yes Ladell Pier, MD  esomeprazole (NEXIUM) 20 MG capsule Take 20 mg by mouth daily at 12 noon.    Yes [provider]  folic acid (FOLVITE) 1 MG tablet Take 3 mg by mouth daily.    Yes [provider]  methotrexate (RHEUMATREX) 2.5 MG tablet Take 10 mg by mouth See admin instructions. Take 10 mg by mouth on Sunday and Monday   Yes [provider]  Multiple Vitamins-Minerals (MULTIVITAMIN WITH MINERALS) tablet Take 1 tablet by mouth daily.   Yes [provider]  clopidogrel (PLAVIX) 75 MG tablet Take 1 tablet (75 mg total) by mouth daily. Take (2) tablets the first day then (1) tablet daily thereafter Patient not taking: Reported on 06/14/2019 07/14/18   Jerline Pain, MD     Vital Signs: BP (!) 128/91   Pulse 60   Temp (!) 97.3 F (36.3 C) (Skin)   Resp 17   Ht 5\' 9"  (1.753 m)   Wt 186 lb (84.4 kg)  SpO2 97%   BMI 27.47 kg/m   Physical Exam awake, alert.  Chest clear to auscultation bilaterally.  Heart with regular rate and rhythm.  Abdomen soft, positive bowel sounds, nontender.  No lower extremity edema  Imaging: No results found.  Labs:  CBC: Recent Labs    06/27/18 0432 06/27/18 0853 06/28/18 0341 10/07/18 0819  WBC 10.3 8.7 10.5 6.8  HGB 12.6* 12.9* 12.8* 12.8*  HCT 38.2* 39.0 38.4* 40.2  PLT 165 172 181 146*    COAGS: Recent Labs    06/26/18 0453 10/07/18 0819  INR 1.2 1.2    BMP: Recent Labs    06/23/18 1701 06/24/18 0234 06/25/18 0403  10/07/18 0819  NA 140 140 139 140  K 4.2 4.0 3.9 4.1  CL 103 107 107 104  CO2 22 23 24 27   GLUCOSE 135* 111* 106* 99  BUN 22 25* 14 21  CALCIUM 9.5 8.9 8.9 9.2  CREATININE 1.04 1.00 0.85 1.01  GFRNONAA >60 >60 >60 >60  GFRAA >60 >60 >60 >60    LIVER FUNCTION TESTS: Recent Labs    06/24/18 0234  BILITOT 0.8  AST 40  ALT 49*  ALKPHOS 79  PROT 6.4*  ALBUMIN 3.2*    Assessment and Plan: 79 yo male with past medical history of pulmonary embolism 06/2018 ,skull lesion and mediastinal adenopathy who was previously scheduled to undergo a calvarial biopsy and needed pulmonary embolism prophylaxis prior to the procedure.  He is on chronic anticoagulation for his PE.  Prior to planned calvarial biopsy he underwent MRI that suggested calvarial lesion was regressing and therefore never required surgery.  Latest CT on 01/31/2019 revealed further decrease in size of calvarial lesion.  He was evaluated by Dr. Anselm Pancoast on 01/04/2019 and deemed an appropriate candidate for IVC filter retrieval.  Lower extremity venous Doppler study on 01/04/2019 was negative for DVT. He presents today for IVC filter retrieval.  Details/risks of procedure, including not limited to, internal bleeding, infection, injury to adjacent structures, inability to retrieve filter discussed with patient with his understanding and consent.   Electronically Signed: D. Rowe Robert, PA-C 06/15/2019, 9:44 AM   I spent a total of 25 minutes at the the patient's bedside AND on the patient's hospital floor or unit, greater than 50% of which was counseling/coordinating care for inferior venacavogram with inferior vena cava filter retrieval

## 2019-06-15 NOTE — Procedures (Signed)
Interventional Radiology Procedure:   Indications: IVC filter is no longer needed.  Procedure: IVC Venogram and filter retrieval  Findings: Patent IVC. Filter successfully removed.  Complications: None     EBL: less than 20 ml  Plan: Discharge to home in 2 hours.    Valarie Farace R. Anselm Pancoast, MD  Pager: [phone]

## 2019-06-15 NOTE — Discharge Instructions (Signed)
Inferior Vena Cava Filter Removal, Care After This sheet gives you information about how to care for yourself after your procedure. Your health care provider may also give you more specific instructions. If you have problems or questions, contact your health care provider. What can I expect after the procedure? After the procedure, it is common to have:  Mild pain and bruising around your incision in your neck or groin.  Fatigue. Follow these instructions at home: Incision care   Follow instructions from your health care provider about how to take care of your incision. Make sure you: ? Wash your hands with soap and water before you change your bandage (dressing). If soap and water are not available, use hand sanitizer. ? Change your dressing as told by your health care provider.  Check your incision area every day for signs of infection. Check for: ? Redness, swelling, or more pain. ? Fluid or blood. ? Warmth. ? Pus or a bad smell. General instructions  Take over-the-counter and prescription medicines only as told by your health care provider.  Do not take baths, swim, or use a hot tub until your health care provider approves. Ask your health care provider if you may take showers. You may only be allowed to take sponge baths.  Do not drive for 24 hours if you were given a medicine to help you relax (sedative) during your procedure.  Return to your normal activities as told by your health care provider. Ask your health care provider what activities are safe for you.  Keep all follow-up visits as told by your health care provider. This is important. Contact a health care provider if:  You have chills or a fever.  You have redness, swelling, or more pain around your incision.  Your incision feels warm to the touch.  You have pus or a bad smell coming from your incision. Get help right away if:  You have blood coming from your incision (active bleeding). ? If you have  bleeding from the incision site, lie down, apply pressure to the area with a clean cloth or gauze, and get help right away.  You have chest pain.  You have difficulty breathing. Summary  Follow instructions from your health care provider about how to take care of your incision.  Return to your normal activities as told by your health care provider.  Check your incision area every day for signs of infection.  Get help right away if you have active bleeding, chest pain, or trouble breathing. This information is not intended to replace advice given to you by your health care provider. Make sure you discuss any questions you have with your health care provider. Document Revised: 01/29/2017 Document Reviewed: 08/27/2016 Elsevier Patient Education  2020 [employer]  

## 2019-06-16 ENCOUNTER — Inpatient Hospital Stay: Payer: Medicare PPO | Attending: Oncology | Admitting: Oncology

## 2019-06-16 ENCOUNTER — Encounter (HOSPITAL_COMMUNITY): Payer: Medicare PPO

## 2019-06-16 ENCOUNTER — Other Ambulatory Visit: Payer: Self-pay

## 2019-06-16 VITALS — BP 122/65 | HR 78 | Temp 98.7°F | Resp 20 | Ht 69.0 in | Wt 190.2 lb

## 2019-06-16 DIAGNOSIS — M069 Rheumatoid arthritis, unspecified: Secondary | ICD-10-CM | POA: Insufficient documentation

## 2019-06-16 DIAGNOSIS — I2699 Other pulmonary embolism without acute cor pulmonale: Secondary | ICD-10-CM | POA: Insufficient documentation

## 2019-06-16 DIAGNOSIS — D7589 Other specified diseases of blood and blood-forming organs: Secondary | ICD-10-CM | POA: Diagnosis not present

## 2019-06-16 DIAGNOSIS — Z7901 Long term (current) use of anticoagulants: Secondary | ICD-10-CM | POA: Diagnosis not present

## 2019-06-16 DIAGNOSIS — M899 Disorder of bone, unspecified: Secondary | ICD-10-CM | POA: Insufficient documentation

## 2019-06-16 DIAGNOSIS — Z8719 Personal history of other diseases of the digestive system: Secondary | ICD-10-CM | POA: Insufficient documentation

## 2019-06-16 DIAGNOSIS — Z86718 Personal history of other venous thrombosis and embolism: Secondary | ICD-10-CM | POA: Diagnosis not present

## 2019-06-16 DIAGNOSIS — Z86711 Personal history of pulmonary embolism: Secondary | ICD-10-CM | POA: Insufficient documentation

## 2019-06-16 DIAGNOSIS — R918 Other nonspecific abnormal finding of lung field: Secondary | ICD-10-CM | POA: Insufficient documentation

## 2019-06-16 DIAGNOSIS — R55 Syncope and collapse: Secondary | ICD-10-CM | POA: Insufficient documentation

## 2019-06-16 DIAGNOSIS — K219 Gastro-esophageal reflux disease without esophagitis: Secondary | ICD-10-CM | POA: Insufficient documentation

## 2019-06-16 DIAGNOSIS — R590 Localized enlarged lymph nodes: Secondary | ICD-10-CM | POA: Diagnosis not present

## 2019-06-16 DIAGNOSIS — I2510 Atherosclerotic heart disease of native coronary artery without angina pectoris: Secondary | ICD-10-CM | POA: Insufficient documentation

## 2019-06-16 NOTE — Progress Notes (Signed)
[facility] OFFICE PROGRESS NOTE   Diagnosis: Pulmonary embolism, skull lesion  INTERVAL HISTORY:   Nicholas Webb returns as scheduled.  He continues anticoagulation therapy.  No symptom of recurrent thrombosis.  He feels well.  Good appetite.  No bleeding.  He underwent removal of the IVC filter yesterday.  He reports the rheumatoid arthritis is doing well while on methotrexate.  Objective:  Vital signs in last 24 hours:  Blood pressure 122/65, pulse 78, temperature 98.7 F (37.1 C), temperature source Temporal, resp. rate 20, height 5\' 9"  (1.753 m), weight 190 lb 3.2 oz (86.3 kg), SpO2 95 %.    HEENT: No scalp or skull mass, scattered areas of superficial brown discoloration and scabbing over the parietal scalp without a discrete nodule or mass Lymphatics: No cervical, supraclavicular, axillary, or inguinal nodes Resp: Bilateral end inspiratory rales at the lower posterior chest  Cardio: Regular rate and rhythm GI: No hepatosplenomegaly, no mass, nontender Vascular: No leg edema   Lab Results:  Lab Results  Component Value Date   WBC 6.8 10/07/2018   HGB 12.8 (L) 10/07/2018   HCT 40.2 10/07/2018   MCV 104.7 (H) 10/07/2018   PLT 146 (L) 10/07/2018   NEUTROABS 5.7 06/23/2018    CMP  Lab Results  Component Value Date   NA 140 10/07/2018   K 4.1 10/07/2018   CL 104 10/07/2018   CO2 27 10/07/2018   GLUCOSE 99 10/07/2018   BUN 21 10/07/2018   CREATININE 0.90 06/15/2019   CALCIUM 9.2 10/07/2018   PROT 6.4 (L) 06/24/2018   ALBUMIN 3.2 (L) 06/24/2018   AST 40 06/24/2018   ALT 49 (H) 06/24/2018   ALKPHOS 67 06/24/2018   BILITOT 0.8 06/24/2018   GFRNONAA >60 10/07/2018   GFRAA >60 10/07/2018    Lab Results  Component Value Date   CEA1 1.4 06/27/2018     Imaging:  IR IVC Filter Retrieval / S&I /Img Guid/Mod Sed  Result Date: 06/15/2019 INDICATION: 79 year old with history of pulmonary embolism and chronic anticoagulation. An IVC filter was  placed in anticipation of a neurosurgical procedure. Neurosurgical procedure was not performed because the calvarial lesion was decreasing in size. Patient no longer needs the filter. EXAM: IVC VENOGRAM IVC FILTER RETRIEVAL ULTRASOUND GUIDANCE FOR VASCULAR ACCESS MEDICATIONS: Moderate sedation none. ANESTHESIA/SEDATION: 2.0 mg IV Versed; 100 mcg IV Fentanyl Moderate Sedation Time:  32 minutes minutes The patient was continuously monitored during the procedure by the interventional radiology nurse under my direct supervision. FLUOROSCOPY TIME:  Fluoroscopy Time: 5 minutes, 35 seconds, 40 mGy CONTRAST:  40 mL Omnipaque [id number] COMPLICATIONS: None immediate. PROCEDURE: Informed written consent was obtained from the patient after a thorough discussion of the procedural risks, benefits and alternatives. All questions were addressed. Maximal Sterile Barrier Technique was utilized including caps, mask, sterile gowns, sterile gloves, sterile drape, hand hygiene and skin antiseptic. A timeout was performed prior to the initiation of the procedure. Ultrasound confirmed a patent right internal jugular vein. Ultrasound image was saved for documentation. Right neck was prepped and draped in sterile fashion. Skin was anesthetized with 1% lidocaine. Small incision was made. Using ultrasound guidance, 21 gauge needle was directed into the right internal jugular vein and micropuncture dilator set was placed. Five French vascular sheath was placed. Bentson wire was advanced into the distal IVC with a 5 [place] catheter. Pigtail catheter was placed. IVC venogram was performed through the pigtail catheter. Pigtail catheter and 5 French vascular sheath removed over a Bentson wire for  a 11 [place]. The hook of the [facility] filter was successfully snared. The inner sheath was advanced over the hook and shoulders of the filter. The filter easily collapsed into the sheath. The legs of the filter within pulled into the inner  sheath. The inner sheath and filter were completely removed through the outer sheath. Follow-up venogram images were obtained through the outer sheath. The outer sheath was removed with manual compression. Bandage placed over the puncture site. FINDINGS: IVC was patent. No evidence for clot within the IVC filter. The filter was inspected after retrieval and all of the filter legs were intact. IVC was patent without extravasation on the post retrieval images. IMPRESSION: Successful retrieval of the IVC filter. PLAN: Patient can follow-up as needed. Electronically Signed   By: Markus Daft M.D.   On: 06/15/2019 16:40    Medications: I have reviewed the patient's current medications.   Assessment/Plan: 1.Skull mass, chest lymphadenopathy  MRI brain 06/24/2018-posterior vertex infiltrated and poorly marginated mass with extending into the underlying pachymeningeal's and into the scalp, the mass invades and narrows the superior sagittal sinus  CTs of the chest, abdomen, and pelvis on 06/25/2018-acute bilateral pulmonary emboli with a large clot burden, necrotic mediastinal adenopathy, 8 mm pleural-based left lower lobe nodule, 4 mm left upper lobe nodules, chronic pulmonary fibrosis  PET scan 07/06/2018- no evidence of a primary malignancy, small pulmonary nodules are not metabolically active, mildly enlarged/mildly metabolic mediastinal nodes favored to be reactive  MRI brain 10/10/2018-progression of calvarial lesion at the vertex, stable presumed vestibular schwannoma internal auditory canal  CT head 10/12/2018- filling in/sclerosis of the parietal vertex lesion with reduction adjacent soft tissue density consistent with healing  CT head 01/31/2019-further decrease in size of the parietal skull lesion, no new abnormality 2. 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  There is no evidence of a progressive malignancy.  He will remain on indefinite anticoagulation therapy after suffering an unprovoked DVT and pulmonary emboli last year.  He is no longer taking Plavix.  Nicholas Webb will return for an office visit in 6 months.  [name], MD  06/16/2019  12:43 PM

## 2019-06-20 ENCOUNTER — Telehealth: Payer: Self-pay | Admitting: Oncology

## 2019-06-20 NOTE — Telephone Encounter (Signed)
Scheduled per los. Called and left msg. Mailed printout  °

## 2019-07-03 NOTE — Addendum Note (Signed)
Encounter addended by: Dorma Russell R on: 07/03/2019 1:32 PM  Actions taken: Flowsheet data copied forward, Flowsheet accepted

## 2019-07-04 ENCOUNTER — Other Ambulatory Visit: Payer: Self-pay | Admitting: Cardiology

## 2019-07-05 NOTE — Addendum Note (Signed)
Encounter addended by: Carma Lair on: 07/05/2019 7:27 AM  Actions taken: Flowsheet data copied forward, Flowsheet accepted

## 2019-07-05 NOTE — Addendum Note (Signed)
Encounter addended by: George Ina, RD on: 07/05/2019 9:24 AM  Actions taken: Flowsheet accepted

## 2019-07-11 NOTE — Addendum Note (Signed)
Encounter addended by: Magda Kiel, RN on: 07/11/2019 1:45 PM  Actions taken: Flowsheet data copied forward, Flowsheet accepted, Clinical Note Signed

## 2019-07-18 ENCOUNTER — Encounter: Payer: Self-pay | Admitting: Podiatry

## 2019-07-18 ENCOUNTER — Other Ambulatory Visit: Payer: Self-pay

## 2019-07-18 ENCOUNTER — Ambulatory Visit: Payer: Medicare PPO | Admitting: Podiatry

## 2019-07-18 VITALS — Temp 97.5°F

## 2019-07-18 DIAGNOSIS — M79676 Pain in unspecified toe(s): Secondary | ICD-10-CM | POA: Diagnosis not present

## 2019-07-18 DIAGNOSIS — D689 Coagulation defect, unspecified: Secondary | ICD-10-CM | POA: Diagnosis not present

## 2019-07-18 DIAGNOSIS — B351 Tinea unguium: Secondary | ICD-10-CM | POA: Diagnosis not present

## 2019-07-18 NOTE — Progress Notes (Signed)
This patient returns to my office for at risk foot care.  This patient requires this care by a professional since this patient will be at risk due to having coagulation defect.  Patient is taking eliquis.  This patient is unable to cut nails himself since the patient cannot reach his nails.These nails are painful walking and wearing shoes.  This patient presents for at risk foot care today.  General Appearance  Alert, conversant and in no acute stress.  Vascular  Dorsalis pedis and posterior tibial  pulses are palpable  bilaterally.  Capillary return is within normal limits  bilaterally. Temperature is within normal limits  bilaterally.  Neurologic  Senn-Weinstein monofilament wire test within normal limits  bilaterally. Muscle power within normal limits bilaterally.  Nails Thick disfigured discolored nails with subungual debris  from hallux to fifth toes bilaterally. No evidence of bacterial infection or drainage bilaterally.  Orthopedic  No limitations of motion  feet .  No crepitus or effusions noted.  No bony pathology or digital deformities noted.  Skin  normotropic skin with no porokeratosis noted bilaterally.  No signs of infections or ulcers noted.     Onychomycosis  Pain in right toes  Pain in left toes  Consent was obtained for treatment procedures.   Mechanical debridement of nails 1-5  bilaterally performed with a nail nipper.  Filed with dremel without incident.    Return office visit    6 months                 Told patient to return for periodic foot care and evaluation due to potential at risk complications.   Gardiner Barefoot DPM

## 2019-07-23 ENCOUNTER — Other Ambulatory Visit: Payer: Self-pay | Admitting: Oncology

## 2019-07-25 ENCOUNTER — Ambulatory Visit: Payer: Medicare PPO | Admitting: Physician Assistant

## 2019-07-25 ENCOUNTER — Encounter: Payer: Self-pay | Admitting: Physician Assistant

## 2019-07-25 ENCOUNTER — Other Ambulatory Visit: Payer: Self-pay

## 2019-07-25 DIAGNOSIS — L570 Actinic keratosis: Secondary | ICD-10-CM | POA: Diagnosis not present

## 2019-07-25 DIAGNOSIS — Z85828 Personal history of other malignant neoplasm of skin: Secondary | ICD-10-CM | POA: Diagnosis not present

## 2019-07-25 DIAGNOSIS — Z1283 Encounter for screening for malignant neoplasm of skin: Secondary | ICD-10-CM

## 2019-07-25 NOTE — Progress Notes (Signed)
   Follow-Up Visit   Subjective  Nicholas Webb is a 79 y.o. male who presents for the following: Follow-up (PDT did great and had alot of peeling).   The following portions of the chart were reviewed this encounter and updated as appropriate: Tobacco  Allergies  Meds  Problems  Med Hx  Surg Hx  Fam Hx      Objective  Well appearing patient in no apparent distress; mood and affect are within normal limits.  All skin waist up examined.  Objective  Left Parietal Scalp, Left Temple (10), Mid Occipital Scalp (4), Right Temporal Scalp (8): Erythematous patches with gritty scale.  Objective  waist up: No atypical nevi   Assessment & Plan  AK (actinic keratosis) (23) Left Parietal Scalp; Mid Occipital Scalp (4); Left Temple (10); Right Temporal Scalp (8)  Destruction of lesion - Left Parietal Scalp, Left Temple, Mid Occipital Scalp, Right Temporal Scalp Complexity: simple   Destruction method: cryotherapy   Informed consent: discussed and consent obtained   Timeout:  patient name, date of birth, surgical site, and procedure verified Lesion destroyed using liquid nitrogen: Yes   Cryotherapy cycles:  1 Outcome: patient tolerated procedure well with no complications   Post-procedure details: wound care instructions given    History of SCC (squamous cell carcinoma) of skin Left Parietal Scalp  History of basal cell carcinoma (BCC) Right Parietal Scalp  Screening exam for skin cancer waist up  observe

## 2019-10-16 ENCOUNTER — Telehealth: Payer: Self-pay

## 2019-10-16 NOTE — Telephone Encounter (Signed)
Returned patient's phone call about next appointment. Spoke with patient. Patient's next appointment is 12/18/19. Patient verbalized understanding.

## 2019-10-24 ENCOUNTER — Telehealth: Payer: Self-pay | Admitting: *Deleted

## 2019-10-24 NOTE — Telephone Encounter (Signed)
New appointment dated noted- thank you  Kerin Ransom PA-C 10/24/2019 10:46 AM

## 2019-10-24 NOTE — Telephone Encounter (Signed)
Was that message meant for me or scheduling- because it was routed to me, I assumed you would contact scheduling so they could call the patient with an updated appointment.  Kerin Ransom PA-C 10/24/2019 10:38 AM

## 2019-10-24 NOTE — Telephone Encounter (Signed)
   [facility] Pre-operative Risk Assessment    HEARTCARE STAFF: - Please ensure there is not already an duplicate clearance open for this procedure. - Under Visit Info/Reason for Call, type in Other and utilize the format Clearance MM/DD/YY or Clearance TBD. Do not use dashes or single digits. - If request is for dental extraction, please clarify the # of teeth to be extracted.  Request for surgical clearance:  1. What type of surgery is being performed? COLONOSCOPY/ENDOSCOPY   2. When is this surgery scheduled? 12/07/19   3. What type of clearance is required (medical clearance vs. Pharmacy clearance to hold med vs. Both)? BOTH  4. Are there any medications that need to be held prior to surgery and how long? ELIQUIS   5. Practice name and name of physician performing surgery? [facility]; DR. Michail Sermon   6. What is the office phone number? [phone]   7.   What is the office fax number? [phone]  8.   Anesthesia type (None, local, MAC, general) ? NOT LISTED   Julaine Hua 10/24/2019, 9:29 AM  _________________________________________________________________   (provider comments below)

## 2019-10-24 NOTE — Telephone Encounter (Signed)
This patient's colonoscopy is scheduled for 10/7.  He has an OV with Dr Marlou Porch 10/11.  Can we move up Dr Kingsley Plan office visit to late September for pre op clearance?  Kerin Ransom PA-C 10/24/2019 9:43 AM

## 2019-10-24 NOTE — Telephone Encounter (Signed)
The patient has an appointment with Dr Marlou Porch 11/30/2019- pre op clearance to be addressed at that visit.  Kerin Ransom PA-C 10/24/2019 10:47 AM

## 2019-10-24 NOTE — Telephone Encounter (Signed)
Pt has been scheduled to see Dr. Marlou Porch 11/30/19.

## 2019-10-24 NOTE — Telephone Encounter (Signed)
Call and leave message on pt voicemail to give office a call back to schedule sooner appointment.

## 2019-11-30 ENCOUNTER — Other Ambulatory Visit: Payer: Self-pay

## 2019-11-30 ENCOUNTER — Ambulatory Visit (INDEPENDENT_AMBULATORY_CARE_PROVIDER_SITE_OTHER): Payer: Medicare PPO | Admitting: Cardiology

## 2019-11-30 ENCOUNTER — Encounter: Payer: Self-pay | Admitting: Cardiology

## 2019-11-30 VITALS — BP 120/70 | HR 70 | Ht 69.0 in | Wt 192.0 lb

## 2019-11-30 DIAGNOSIS — I2699 Other pulmonary embolism without acute cor pulmonale: Secondary | ICD-10-CM

## 2019-11-30 DIAGNOSIS — I2510 Atherosclerotic heart disease of native coronary artery without angina pectoris: Secondary | ICD-10-CM

## 2019-11-30 NOTE — Patient Instructions (Addendum)
Medication Instructions:  The current medical regimen is effective;  continue present plan and medications.  *If you need a refill on your cardiac medications before your next appointment, please call your pharmacy*  Follow-Up: At [facility], you and your health needs are our priority.  As part of our continuing mission to provide you with exceptional heart care, we have created designated Provider Care Teams.  These Care Teams include your primary Cardiologist (physician) and Advanced Practice Providers (APPs -  Physician Assistants and Nurse Practitioners) who all work together to provide you with the care you need, when you need it.  We recommend signing up for the patient portal called "MyChart".  Sign up information is provided on this After Visit Summary.  MyChart is used to connect with patients for Virtual Visits (Telemedicine).  Patients are able to view lab/test results, encounter notes, upcoming appointments, etc.  Non-urgent messages can be sent to your provider as well.   To learn more about what you can do with MyChart, go to [email or web address].    Your next appointment:   6 month(s)  The format for your next appointment:   In Person  Provider:   Candee Furbish, MD   Thank you for choosing [facility]!!    May hold Eliquis the day before (AM and PM) the day of (AM and PM) and restart on Friday after your colonoscopy.

## 2019-11-30 NOTE — Progress Notes (Signed)
Cardiology Office Note:    Date:  11/30/2019   ID:  STEFANO TRULSON, DOB [birth year], MRN [id number]  PCP:  Maurice Small, MD  [facility] Cardiologist:  Candee Furbish, MD  [facility] Electrophysiologist:  None   Referring MD: Maurice Small, MD     History of Present Illness:    Nicholas Webb is a 79 y.o. male here for preoperative evaluation prior to colonoscopy scheduled for 12/07/2019.    Had a stent placement to his LAD on 06/24/2018.  This was complicated by a skull mass.  The skull mass on its own, decreased in size.  Possible infection.  No biopsy was needed by neurosurgery.  Plavix was stopped because of concomitant Eliquis use for bilateral PE.  Previously had a syncopal episode and pulmonary embolism was discovered.  He has been taking Eliquis.  Oncology has been following.  IVC filter has been removed.  Works at the [employer].  Overall he has been doing quite well.  Not having any anginal symptoms no shortness of breath.  Ambulating well.  No recurrence of thrombosis.  Reviewed Dr. Gearldine Shown note from oncology on 06/16/2019.  IVC filter has been removed once again.   Past Medical History:  Diagnosis Date  . Anemia of chronic disease   . Atherosclerosis of both carotid arteries   . Atypical mole 03/28/2013   moderate atypia on mid back  . Barrett's esophagus   . Basal cell carcinoma 12/17/2015   superficial on right postauricular - CX3+5FU  . Dizziness   . GERD (gastroesophageal reflux disease)   . Hx of adenomatous colonic polyps   . Hyperlipidemia   . Macrocytosis without anemia   . Obesity   . OSA (obstructive sleep apnea)   . Rheumatoid arthritis(714.0)   . Squamous cell carcinoma of skin 02/25/2010   in situ on left sideburn, inferior - CX3+5FU  . Squamous cell carcinoma of skin 02/25/2010   well differentiated on right sideburn - CX3+5FU  . Squamous cell carcinoma of skin 03/24/2011   in situ on right temple - CX3+5FU  . Squamous cell carcinoma  of skin 03/24/2011   in situ on right jawline - CX3+5FU  . Squamous cell carcinoma of skin 03/24/2011   in situ on right angle of jaw - CX3+5FU  . Squamous cell carcinoma of skin 05/05/2011   left temple - CX3+5FU  . Squamous cell carcinoma of skin 11/28/2013   in situ on mid scalp - tx p bx  . Squamous cell carcinoma of skin 12/17/2015   in situ on right post crown - CX3+5FU  . Squamous cell carcinoma of skin 12/17/2015   verruca vulgaris with squamous atypia on right thumb - CX3+5FU  . Squamous cell carcinoma of skin 01/03/2016   in situ on right crown post inf  . Squamous cell carcinoma of skin 04/22/2016   well differentiated on left outer low leg  . Squamous cell carcinoma of skin 10/06/2016   in situ on right subauricular - CX3+Cautery+5FU  . Squamous cell carcinoma of skin 12/22/2016   in situ on mid post crown - tx p bx  . Squamous cell carcinoma of skin 01/11/2018   in situ on right crown - tx p bx  . Squamous cell carcinoma of skin 01/11/2018   in situ on mid crown - tx p bx  . Squamous cell carcinoma of skin 01/11/2018   in situ on left crown - tx p bx  . Squamous cell carcinoma of skin 01/11/2018  well differentiated on right temple - tx p bx  . Squamous cell carcinoma of skin 03/07/2019   well differentiated on right neck - tx p bx  . Squamous cell carcinoma of skin 03/07/2019   in situ on right temple - tx p bx  . Squamous cell carcinoma of skin 03/07/2019   in situ on mid forehead - tx p bx  . Syncope     Past Surgical History:  Procedure Laterality Date  . CORONARY STENT INTERVENTION N/A 06/24/2018   Procedure: CORONARY STENT INTERVENTION;  Surgeon: Burnell Blanks, MD;  Location: [facility];  Service: Cardiovascular;  Laterality: N/A;  . IR IVC FILTER PLMT / S&I Burke Keels GUID/MOD SED  10/07/2018  . IR IVC FILTER RETRIEVAL / S&I /IMG GUID/MOD SED  06/15/2019  . IR RADIOLOGIST EVAL & MGMT  09/27/2018  . IR RADIOLOGIST EVAL & MGMT  01/04/2019  . JOINT  REPLACEMENT  2004   left  . LEFT HEART CATH AND CORONARY ANGIOGRAPHY N/A 06/24/2018   Procedure: LEFT HEART CATH AND CORONARY ANGIOGRAPHY;  Surgeon: Burnell Blanks, MD;  Location: [place] [facility];  Service: Cardiovascular;  Laterality: N/A;    Current Medications: Current Meds  Medication Sig  . atorvastatin (LIPITOR) 80 MG tablet Take 80 mg by mouth daily.   Marland Kitchen ELIQUIS 5 MG TABS tablet TAKE 1 TABLET(5 MG) BY MOUTH TWICE DAILY  . esomeprazole (NEXIUM) 20 MG capsule Take 20 mg by mouth daily at 12 noon.   . folic acid (FOLVITE) 1 MG tablet Take 3 mg by mouth daily.   . methotrexate (RHEUMATREX) 2.5 MG tablet Take 10 mg by mouth See admin instructions. Take 10 mg by mouth on Sunday and Monday  . Multiple Vitamins-Minerals (MULTIVITAMIN WITH MINERALS) tablet Take 1 tablet by mouth daily.     Allergies:   Patient has no known allergies.   Social History   Socioeconomic History  . Marital status: Married    Spouse name: Katharine Look  . Number of children: 2  . Years of education: Not on file  . Highest education level: Bachelor's degree (e.g., BA, AB, BS)  Occupational History    Employer: [employer]  Tobacco Use  . Smoking status: Never Smoker  . Smokeless tobacco: Never Used  Vaping Use  . Vaping Use: Never used  Substance and Sexual Activity  . Alcohol use: Yes    Alcohol/week: 1.0 standard drink    Types: 1 Glasses of wine per week    Comment: OCCASION  . Drug use: No  . Sexual activity: Not on file  Other Topics Concern  . Not on file  Social History Narrative   Patient is right-handed.  He lives with his wife in a one level home. He does not exercise.    Social Determinants of Health   Financial Resource Strain: Low Risk   . Difficulty of Paying Living Expenses: Not hard at all  Food Insecurity: No Food Insecurity  . Worried About Charity fundraiser in the Last Year: Never true  . Ran Out of Food in the Last Year: Never true  Transportation  Needs: No Transportation Needs  . Lack of Transportation (Medical): No  . Lack of Transportation (Non-Medical): No  Physical Activity: Inactive  . Days of Exercise per Week: 0 days  . Minutes of Exercise per Session: 0 min  Stress: No Stress Concern Present  . Feeling of Stress : Not at all  Social Connections: Moderately Integrated  .  Frequency of Communication with Friends and Family: Three times a week  . Frequency of Social Gatherings with Friends and Family: Once a week  . Attends Religious Services: More than 4 times per year  . Active Member of Clubs or Organizations: No  . Attends Archivist Meetings: Not on file  . Marital Status: Married     Family History: The patient's family history includes Cancer in his father; Colon cancer in his father; Glaucoma in his mother; Prostate cancer in his father; Stroke in his mother.  ROS:   Please see the history of present illness.     All other systems reviewed and are negative.  EKGs/Labs/Other Studies Reviewed:    The following studies were reviewed today:  ECHO 06/2018 1. The left ventricle has normal systolic function, with an ejection  fraction of 55-60%. The cavity size was normal. Left ventricular diastolic  Doppler parameters are indeterminate. No evidence of left ventricular  regional wall motion abnormalities.  2. The right ventricle has normal systolic function. The cavity was  mildly enlarged. There is no increase in right ventricular wall thickness.  Right ventricular systolic pressure moderate to severely elevated with an  estimated pressure of 51.7 mmHg.  3. The aortic valve is tricuspid. Aortic valve regurgitation is trivial  by color flow Doppler.  4. The inferior vena cava was dilated in size with <50% respiratory  variability.   EKG:  EKG is  ordered today.  The ekg ordered today demonstrates sinus rhythm 70 right bundle branch block PVC  Recent Labs: 06/15/2019: Creatinine, Ser 0.90  Recent  Lipid Panel    Component Value Date/Time   CHOL 100 06/13/2019 1055   TRIG 161 (H) 06/13/2019 1055   HDL 41 06/13/2019 1055   CHOLHDL 2.4 06/13/2019 1055   CHOLHDL 3.4 06/23/2018 1933   VLDL 7 06/23/2018 1933   LDLCALC 32 06/13/2019 1055    Physical Exam:    VS:  BP 120/70   Pulse 70   Ht 5\' 9"  (1.753 m)   Wt 192 lb (87.1 kg)   SpO2 94%   BMI 28.35 kg/m     Wt Readings from Last 3 Encounters:  11/30/19 192 lb (87.1 kg)  06/16/19 190 lb 3.2 oz (86.3 kg)  06/15/19 186 lb (84.4 kg)     GEN: Well nourished, well developed in no acute distress HEENT: Normal NECK: No JVD; No carotid bruits LYMPHATICS: No lymphadenopathy CARDIAC: RRR, no murmurs, rubs, gallops RESPIRATORY:  Clear to auscultation without rales, wheezing or rhonchi  ABDOMEN: Soft, non-tender, non-distended MUSCULOSKELETAL:  No edema; No deformity  SKIN: Warm and dry NEUROLOGIC:  Alert and oriented x 3 PSYCHIATRIC:  Normal affect   ASSESSMENT:    1. Coronary artery disease involving native coronary artery of native heart without angina pectoris   2. Bilateral pulmonary embolism (HCC)    PLAN:    In order of problems listed above:  Preoperative risk stratification -May proceed with colonoscopy with low overall cardiac risk. -Since he has had an unprovoked pulmonary embolism and has had his IVC filter removed, I would recommend holding his Eliquis for 1 day prior, on Wednesday, 2 total doses, morning and evening and then the morning dose of the colonoscopy on Thursday as well as the evening dose of the colonoscopy.  Resume Eliquis on Friday morning a.m. dose.  CAD -Stable no anginal symptoms. -Continuing with current medications, secondary risk factor prevention.  Skull mass -Decreased in size.  No biopsy.  Dr. Ronnald Ramp  had been following.  Right bundle branch block -Stable, chronic.  No syncope.  Prior unprovoked bilateral pulmonary embolism -On lifelong Eliquis.  Appreciate Dr.  Benay Spice   Medication Adjustments/Labs and Tests Ordered: Current medicines are reviewed at length with the patient today.  Concerns regarding medicines are outlined above.  Orders Placed This Encounter  Procedures  . EKG 12-Lead   No orders of the defined types were placed in this encounter.   Patient Instructions  Medication Instructions:  The current medical regimen is effective;  continue present plan and medications.  *If you need a refill on your cardiac medications before your next appointment, please call your pharmacy*  Follow-Up: At [facility], you and your health needs are our priority.  As part of our continuing mission to provide you with exceptional heart care, we have created designated Provider Care Teams.  These Care Teams include your primary Cardiologist (physician) and Advanced Practice Providers (APPs -  Physician Assistants and Nurse Practitioners) who all work together to provide you with the care you need, when you need it.  We recommend signing up for the patient portal called "MyChart".  Sign up information is provided on this After Visit Summary.  MyChart is used to connect with patients for Virtual Visits (Telemedicine).  Patients are able to view lab/test results, encounter notes, upcoming appointments, etc.  Non-urgent messages can be sent to your provider as well.   To learn more about what you can do with MyChart, go to [email or web address].    Your next appointment:   6 month(s)  The format for your next appointment:   In Person  Provider:   Candee Furbish, MD   Thank you for choosing [facility]!!    May hold Eliquis the day before (AM and PM) the day of (AM and PM) and restart on Friday after your colonoscopy.    Signed, Candee Furbish, MD  11/30/2019 4:50 PM    [facility]

## 2019-12-04 DIAGNOSIS — Z1159 Encounter for screening for other viral diseases: Secondary | ICD-10-CM | POA: Diagnosis not present

## 2019-12-07 DIAGNOSIS — K2270 Barrett's esophagus without dysplasia: Secondary | ICD-10-CM | POA: Diagnosis not present

## 2019-12-07 DIAGNOSIS — Z86010 Personal history of colonic polyps: Secondary | ICD-10-CM | POA: Diagnosis not present

## 2019-12-07 DIAGNOSIS — K5730 Diverticulosis of large intestine without perforation or abscess without bleeding: Secondary | ICD-10-CM | POA: Diagnosis not present

## 2019-12-07 DIAGNOSIS — K640 First degree hemorrhoids: Secondary | ICD-10-CM | POA: Diagnosis not present

## 2019-12-07 DIAGNOSIS — K219 Gastro-esophageal reflux disease without esophagitis: Secondary | ICD-10-CM | POA: Diagnosis not present

## 2019-12-11 ENCOUNTER — Ambulatory Visit: Payer: Medicare PPO | Admitting: Cardiology

## 2019-12-12 ENCOUNTER — Telehealth: Payer: Self-pay | Admitting: Oncology

## 2019-12-12 DIAGNOSIS — G4733 Obstructive sleep apnea (adult) (pediatric): Secondary | ICD-10-CM | POA: Diagnosis not present

## 2019-12-12 DIAGNOSIS — K2270 Barrett's esophagus without dysplasia: Secondary | ICD-10-CM | POA: Diagnosis not present

## 2019-12-12 NOTE — Telephone Encounter (Signed)
Spoke to patient and confirmed that patient's appointment is on 10/18. Patient is aware of appointment date and time.

## 2019-12-18 ENCOUNTER — Inpatient Hospital Stay: Payer: Medicare PPO | Admitting: Oncology

## 2019-12-28 DIAGNOSIS — I2510 Atherosclerotic heart disease of native coronary artery without angina pectoris: Secondary | ICD-10-CM | POA: Diagnosis not present

## 2019-12-28 DIAGNOSIS — Z79899 Other long term (current) drug therapy: Secondary | ICD-10-CM | POA: Diagnosis not present

## 2019-12-28 DIAGNOSIS — M0589 Other rheumatoid arthritis with rheumatoid factor of multiple sites: Secondary | ICD-10-CM | POA: Diagnosis not present

## 2019-12-28 DIAGNOSIS — E559 Vitamin D deficiency, unspecified: Secondary | ICD-10-CM | POA: Diagnosis not present

## 2019-12-28 DIAGNOSIS — M8580 Other specified disorders of bone density and structure, unspecified site: Secondary | ICD-10-CM | POA: Diagnosis not present

## 2019-12-28 DIAGNOSIS — M150 Primary generalized (osteo)arthritis: Secondary | ICD-10-CM | POA: Diagnosis not present

## 2020-01-01 ENCOUNTER — Inpatient Hospital Stay: Payer: Medicare PPO | Attending: Oncology | Admitting: Oncology

## 2020-01-01 ENCOUNTER — Telehealth: Payer: Self-pay | Admitting: Oncology

## 2020-01-01 ENCOUNTER — Other Ambulatory Visit: Payer: Self-pay

## 2020-01-01 VITALS — BP 110/53 | HR 73 | Temp 97.7°F | Resp 16 | Ht 69.0 in | Wt 192.5 lb

## 2020-01-01 DIAGNOSIS — R590 Localized enlarged lymph nodes: Secondary | ICD-10-CM | POA: Insufficient documentation

## 2020-01-01 DIAGNOSIS — Z8719 Personal history of other diseases of the digestive system: Secondary | ICD-10-CM | POA: Diagnosis not present

## 2020-01-01 DIAGNOSIS — I2510 Atherosclerotic heart disease of native coronary artery without angina pectoris: Secondary | ICD-10-CM | POA: Diagnosis not present

## 2020-01-01 DIAGNOSIS — D7589 Other specified diseases of blood and blood-forming organs: Secondary | ICD-10-CM | POA: Insufficient documentation

## 2020-01-01 DIAGNOSIS — D333 Benign neoplasm of cranial nerves: Secondary | ICD-10-CM | POA: Insufficient documentation

## 2020-01-01 DIAGNOSIS — K219 Gastro-esophageal reflux disease without esophagitis: Secondary | ICD-10-CM | POA: Diagnosis not present

## 2020-01-01 DIAGNOSIS — I2699 Other pulmonary embolism without acute cor pulmonale: Secondary | ICD-10-CM | POA: Diagnosis not present

## 2020-01-01 DIAGNOSIS — Z79899 Other long term (current) drug therapy: Secondary | ICD-10-CM | POA: Diagnosis not present

## 2020-01-01 DIAGNOSIS — Z86718 Personal history of other venous thrombosis and embolism: Secondary | ICD-10-CM | POA: Diagnosis not present

## 2020-01-01 DIAGNOSIS — J8410 Pulmonary fibrosis, unspecified: Secondary | ICD-10-CM | POA: Diagnosis not present

## 2020-01-01 DIAGNOSIS — M069 Rheumatoid arthritis, unspecified: Secondary | ICD-10-CM | POA: Insufficient documentation

## 2020-01-01 NOTE — Progress Notes (Signed)
[place] OFFICE PROGRESS NOTE   Diagnosis: DVT/pulmonary embolism  INTERVAL HISTORY:   Nicholas Webb returns as scheduled.  He continues apixaban anticoagulation.  No bleeding.  No symptom of recurrent thrombosis.  He feels well.  Good appetite.  He is seeing dermatology later this week to address skin lesions at the parietal scalp.  Objective:  Vital signs in last 24 hours:  Blood pressure (!) 110/53, pulse 73, temperature 97.7 F (36.5 C), temperature source Tympanic, resp. rate 16, height 5\' 9"  (1.753 m), weight 192 lb 8 oz (87.3 kg), SpO2 97 %.    Lymphatics: No cervical, supraclavicular, axillary, or inguinal nodes Resp: Lungs with end inspiratory rales at the posterior base bilaterally, no respiratory distress Cardio: Regular rate and rhythm GI: No hepatosplenomegaly, no mass, nontender Vascular: No leg edema  Skin: Crusted lesions at the parietal scalp  Portacath/PICC-without erythema  Lab Results:  Lab Results  Component Value Date   WBC 6.8 10/07/2018   HGB 12.8 (L) 10/07/2018   HCT 40.2 10/07/2018   MCV 104.7 (H) 10/07/2018   PLT 146 (L) 10/07/2018   NEUTROABS 5.7 06/23/2018    CMP  Lab Results  Component Value Date   NA 140 10/07/2018   K 4.1 10/07/2018   CL 104 10/07/2018   CO2 27 10/07/2018   GLUCOSE 99 10/07/2018   BUN 21 10/07/2018   CREATININE 0.90 06/15/2019   CALCIUM 9.2 10/07/2018   PROT 6.4 (L) 06/24/2018   ALBUMIN 3.2 (L) 06/24/2018   AST 40 06/24/2018   ALT 49 (H) 06/24/2018   ALKPHOS 67 06/24/2018   BILITOT 0.8 06/24/2018   GFRNONAA >60 10/07/2018   GFRAA >60 10/07/2018    Lab Results  Component Value Date   CEA1 1.4 06/27/2018    Lab Results  Component Value Date   INR 1.2 10/07/2018    Imaging:  No results found.  Medications: I have reviewed the patient's current medications.   Assessment/Plan: 1.Skull mass, chest lymphadenopathy  MRI brain 06/24/2018-posterior vertex infiltrated and poorly  marginated mass with extending into the underlying pachymeningeal's and into the scalp, the mass invades and narrows the superior sagittal sinus  CTs of the chest, abdomen, and pelvis on 06/25/2018-acute bilateral pulmonary emboli with a large clot burden, necrotic mediastinal adenopathy, 8 mm pleural-based left lower lobe nodule, 4 mm left upper lobe nodules, chronic pulmonary fibrosis  PET scan 07/06/2018- no evidence of a primary malignancy, small pulmonary nodules are not metabolically active, mildly enlarged/mildly metabolic mediastinal nodes favored to be reactive  MRI brain 10/10/2018-progression of calvarial lesion at the vertex, stable presumed vestibular schwannoma internal auditory canal  CT head 10/12/2018- filling in/sclerosis of the parietal vertex lesion with reduction adjacent soft tissue density consistent with healing  CT head 01/31/2019-further decrease in size of the parietal skull lesion, no new abnormality 2. 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  He had a DVT and pulmonary emboli in April 2020.  He continues indefinite anticoagulation therapy.  There is no history to suggest recurrent venous  thromboembolic disease.  There is no evidence of an underlying malignancy.  Nicholas Webb will return for an office visit in 1 year.  [name],  MD  01/01/2020  9:25 AM

## 2020-01-01 NOTE — Telephone Encounter (Signed)
Scheduled per los. Gave avs and calendar  

## 2020-01-04 ENCOUNTER — Other Ambulatory Visit: Payer: Self-pay

## 2020-01-04 ENCOUNTER — Ambulatory Visit: Payer: Medicare PPO | Admitting: Dermatology

## 2020-01-04 ENCOUNTER — Encounter: Payer: Self-pay | Admitting: Dermatology

## 2020-01-04 DIAGNOSIS — L820 Inflamed seborrheic keratosis: Secondary | ICD-10-CM

## 2020-01-04 DIAGNOSIS — D485 Neoplasm of uncertain behavior of skin: Secondary | ICD-10-CM

## 2020-01-04 DIAGNOSIS — C4442 Squamous cell carcinoma of skin of scalp and neck: Secondary | ICD-10-CM | POA: Diagnosis not present

## 2020-01-04 DIAGNOSIS — D044 Carcinoma in situ of skin of scalp and neck: Secondary | ICD-10-CM | POA: Diagnosis not present

## 2020-01-04 NOTE — Progress Notes (Signed)
° °  Computer changed numbers and we can't reprint requisition.. the numbers on top of the bottle are what the numbers should be, not what the labs states.(the printed number is wrong - handwritten is correct.)

## 2020-01-04 NOTE — Patient Instructions (Signed)

## 2020-01-09 ENCOUNTER — Telehealth: Payer: Self-pay | Admitting: *Deleted

## 2020-01-09 NOTE — Telephone Encounter (Signed)
Patient left VM asking if he had an appointment this week with Dr. Benay Spice. Called back and provided the appointment for 12/30/20 at 10 am. Per last OV, he does not need to be seen for 1 year.

## 2020-01-10 ENCOUNTER — Telehealth: Payer: Self-pay | Admitting: *Deleted

## 2020-01-10 NOTE — Telephone Encounter (Signed)
-----   Message from [name], MD sent at 01/10/2020  7:16 AM EST ----- Please schedule surgical appointment with me; determine at the visit which ones will be treated.

## 2020-01-10 NOTE — Telephone Encounter (Signed)
Left message for patient to call us back.  

## 2020-01-10 NOTE — Telephone Encounter (Signed)
Pathology to patient, surgery schedule.

## 2020-01-11 ENCOUNTER — Other Ambulatory Visit: Payer: Self-pay | Admitting: Oncology

## 2020-01-19 ENCOUNTER — Other Ambulatory Visit: Payer: Self-pay

## 2020-01-19 ENCOUNTER — Ambulatory Visit: Payer: Medicare PPO | Admitting: Podiatry

## 2020-01-19 ENCOUNTER — Encounter: Payer: Self-pay | Admitting: Podiatry

## 2020-01-19 DIAGNOSIS — M79676 Pain in unspecified toe(s): Secondary | ICD-10-CM | POA: Diagnosis not present

## 2020-01-19 DIAGNOSIS — B351 Tinea unguium: Secondary | ICD-10-CM | POA: Diagnosis not present

## 2020-01-19 DIAGNOSIS — D689 Coagulation defect, unspecified: Secondary | ICD-10-CM | POA: Diagnosis not present

## 2020-01-19 NOTE — Progress Notes (Signed)
This patient returns to my office for at risk foot care.  This patient requires this care by a professional since this patient will be at risk due to having coagulation defect.  Patient is taking eliquis.  This patient is unable to cut nails himself since the patient cannot reach his nails.These nails are painful walking and wearing shoes.  This patient presents for at risk foot care today.  General Appearance  Alert, conversant and in no acute stress.  Vascular  Dorsalis pedis and posterior tibial  pulses are palpable  bilaterally.  Capillary return is within normal limits  bilaterally. Temperature is within normal limits  bilaterally.  Neurologic  Senn-Weinstein monofilament wire test within normal limits  bilaterally. Muscle power within normal limits bilaterally.  Nails Thick disfigured discolored nails with subungual debris  from hallux to fifth toes bilaterally. No evidence of bacterial infection or drainage bilaterally.  Orthopedic  No limitations of motion  feet .  No crepitus or effusions noted.  No bony pathology or digital deformities noted.  DJD 1st MPJ right foot.  Skin  normotropic skin with no porokeratosis noted bilaterally.  No signs of infections or ulcers noted.     Onychomycosis  Pain in right toes  Pain in left toes  Consent was obtained for treatment procedures.   Mechanical debridement of nails 1-5  bilaterally performed with a nail nipper.  Filed with dremel without incident.    Return office visit    4 months                 Told patient to return for periodic foot care and evaluation due to potential at risk complications.   Lyanna Blystone DPM  

## 2020-01-23 DIAGNOSIS — G4733 Obstructive sleep apnea (adult) (pediatric): Secondary | ICD-10-CM | POA: Diagnosis not present

## 2020-01-25 ENCOUNTER — Encounter: Payer: Self-pay | Admitting: Dermatology

## 2020-01-25 NOTE — Progress Notes (Signed)
° °  Follow-Up Visit   Subjective  Nicholas Webb is a 79 y.o. male who presents for the following: Skin Problem (scalp- crown of scalp- x months).  New crusts Location: Scalp Duration:  Quality:  Associated Signs/Symptoms: Modifying Factors:  Severity:  Timing: Context: History of multiple carcinomas skin.  Objective  Well appearing patient in no apparent distress; mood and affect are within normal limits.  A focused examination was performed including Head and neck.. Relevant physical exam findings are noted in the Assessment and Plan.   Assessment & Plan    Neoplasm of uncertain behavior of skin (4) Right Scalp Inferior  Skin / nail biopsy Type of biopsy: tangential   Informed consent: discussed and consent obtained   Timeout: patient name, date of birth, surgical site, and procedure verified   Procedure prep:  Patient was prepped and draped in usual sterile fashion (Non sterile) Prep type:  Chlorhexidine Anesthesia: the lesion was anesthetized in a standard fashion   Anesthetic:  1% lidocaine w/ epinephrine 1-100,000 local infiltration Instrument used: flexible razor blade   Outcome: patient tolerated procedure well   Post-procedure details: wound care instructions given    Specimen 1 - Surgical pathology Differential Diagnosis: bcc vs scc Check Margins: No  Right Scalp Superior  Skin / nail biopsy Type of biopsy: tangential   Informed consent: discussed and consent obtained   Timeout: patient name, date of birth, surgical site, and procedure verified   Procedure prep:  Patient was prepped and draped in usual sterile fashion (Non sterile) Prep type:  Chlorhexidine Anesthesia: the lesion was anesthetized in a standard fashion   Anesthetic:  1% lidocaine w/ epinephrine 1-100,000 local infiltration Instrument used: flexible razor blade   Outcome: patient tolerated procedure well   Post-procedure details: wound care instructions given    Specimen 2 - Surgical  pathology Differential Diagnosis: bcc vs scc Check Margins: No  Mid Parietal Scalp  Skin / nail biopsy Type of biopsy: tangential   Informed consent: discussed and consent obtained   Timeout: patient name, date of birth, surgical site, and procedure verified   Procedure prep:  Patient was prepped and draped in usual sterile fashion (Non sterile) Prep type:  Chlorhexidine Anesthesia: the lesion was anesthetized in a standard fashion   Anesthetic:  1% lidocaine w/ epinephrine 1-100,000 local infiltration Instrument used: flexible razor blade   Outcome: patient tolerated procedure well   Post-procedure details: wound care instructions given    Specimen 3 - Surgical pathology Differential Diagnosis: bcc vs scc Check Margins: No  Left Parietal Scalp  Skin / nail biopsy Type of biopsy: tangential   Informed consent: discussed and consent obtained   Timeout: patient name, date of birth, surgical site, and procedure verified   Procedure prep:  Patient was prepped and draped in usual sterile fashion (Non sterile) Prep type:  Chlorhexidine Anesthesia: the lesion was anesthetized in a standard fashion   Anesthetic:  1% lidocaine w/ epinephrine 1-100,000 local infiltration Instrument used: flexible razor blade   Outcome: patient tolerated procedure well   Post-procedure details: wound care instructions given    Specimen 4 - Surgical pathology Differential Diagnosis: scc vs bcc Check Margins: No      I, [name], MD, have reviewed all documentation for this visit.  The documentation on 01/25/20 for the exam, diagnosis, procedures, and orders are all accurate and complete.

## 2020-02-06 DIAGNOSIS — Z79899 Other long term (current) drug therapy: Secondary | ICD-10-CM | POA: Diagnosis not present

## 2020-02-06 DIAGNOSIS — E559 Vitamin D deficiency, unspecified: Secondary | ICD-10-CM | POA: Diagnosis not present

## 2020-02-06 DIAGNOSIS — M8580 Other specified disorders of bone density and structure, unspecified site: Secondary | ICD-10-CM | POA: Diagnosis not present

## 2020-02-06 DIAGNOSIS — I2510 Atherosclerotic heart disease of native coronary artery without angina pectoris: Secondary | ICD-10-CM | POA: Diagnosis not present

## 2020-02-06 DIAGNOSIS — M0589 Other rheumatoid arthritis with rheumatoid factor of multiple sites: Secondary | ICD-10-CM | POA: Diagnosis not present

## 2020-02-06 DIAGNOSIS — M150 Primary generalized (osteo)arthritis: Secondary | ICD-10-CM | POA: Diagnosis not present

## 2020-03-07 ENCOUNTER — Encounter: Payer: Medicare PPO | Admitting: Dermatology

## 2020-03-07 DIAGNOSIS — M0589 Other rheumatoid arthritis with rheumatoid factor of multiple sites: Secondary | ICD-10-CM | POA: Diagnosis not present

## 2020-03-07 DIAGNOSIS — M150 Primary generalized (osteo)arthritis: Secondary | ICD-10-CM | POA: Diagnosis not present

## 2020-03-07 DIAGNOSIS — Z79899 Other long term (current) drug therapy: Secondary | ICD-10-CM | POA: Diagnosis not present

## 2020-03-07 DIAGNOSIS — R0602 Shortness of breath: Secondary | ICD-10-CM | POA: Diagnosis not present

## 2020-03-07 DIAGNOSIS — I2510 Atherosclerotic heart disease of native coronary artery without angina pectoris: Secondary | ICD-10-CM | POA: Diagnosis not present

## 2020-03-07 DIAGNOSIS — M8580 Other specified disorders of bone density and structure, unspecified site: Secondary | ICD-10-CM | POA: Diagnosis not present

## 2020-03-07 DIAGNOSIS — E559 Vitamin D deficiency, unspecified: Secondary | ICD-10-CM | POA: Diagnosis not present

## 2020-03-12 DIAGNOSIS — G4733 Obstructive sleep apnea (adult) (pediatric): Secondary | ICD-10-CM | POA: Diagnosis not present

## 2020-03-14 ENCOUNTER — Ambulatory Visit (INDEPENDENT_AMBULATORY_CARE_PROVIDER_SITE_OTHER): Payer: Medicare PPO | Admitting: Physician Assistant

## 2020-03-14 ENCOUNTER — Other Ambulatory Visit: Payer: Self-pay

## 2020-03-14 ENCOUNTER — Encounter: Payer: Self-pay | Admitting: Physician Assistant

## 2020-03-14 DIAGNOSIS — C4442 Squamous cell carcinoma of skin of scalp and neck: Secondary | ICD-10-CM | POA: Diagnosis not present

## 2020-03-14 DIAGNOSIS — C4492 Squamous cell carcinoma of skin, unspecified: Secondary | ICD-10-CM

## 2020-03-14 NOTE — Patient Instructions (Signed)

## 2020-03-15 ENCOUNTER — Encounter: Payer: Self-pay | Admitting: Physician Assistant

## 2020-03-15 NOTE — Progress Notes (Signed)
   Follow-Up Visit   Subjective  Nicholas Webb is a 80 y.o. male who presents for the following: Procedure (SCC X 3 ).   The following portions of the chart were reviewed this encounter and updated as appropriate:      Objective  Well appearing patient in no apparent distress; mood and affect are within normal limits.  A focused examination was performed including scalp. Relevant physical exam findings are noted in the Assessment and Plan.  Objective  Mid Parietal Scalp: White scaly plaque  Objective  Right Scalp Superior: Small scabs on pink base  Objective  Left Parietal Scalp: Small scabs on pink base   Assessment & Plan  Squamous cell carcinoma of skin (3) Mid Parietal Scalp  Destruction of lesion Complexity: simple   Destruction method: electrodesiccation and curettage   Informed consent: discussed and consent obtained   Timeout:  patient name, date of birth, surgical site, and procedure verified Anesthesia: the lesion was anesthetized in a standard fashion   Anesthetic:  1% lidocaine w/ epinephrine 1-100,000 local infiltration Curettage performed in three different directions: Yes   Curettage cycles:  3 Final wound size (cm):  0.6 Hemostasis achieved with:  ferric subsulfate Outcome: patient tolerated procedure well with no complications   Additional details:  Wound innoculated with 5 fluorouracil solution.  Right Scalp Superior  Destruction of lesion Complexity: simple   Destruction method: electrodesiccation and curettage   Informed consent: discussed and consent obtained   Timeout:  patient name, date of birth, surgical site, and procedure verified Anesthesia: the lesion was anesthetized in a standard fashion   Anesthetic:  1% lidocaine w/ epinephrine 1-100,000 local infiltration Curettage performed in three different directions: Yes   Curettage cycles:  3 Final wound size (cm):  1.8 Hemostasis achieved with:  ferric subsulfate Outcome: patient  tolerated procedure well with no complications   Additional details:  Wound innoculated with 5 fluorouracil solution.  Left Parietal Scalp  Destruction of lesion Complexity: simple   Destruction method: electrodesiccation and curettage   Informed consent: discussed and consent obtained   Timeout:  patient name, date of birth, surgical site, and procedure verified Anesthesia: the lesion was anesthetized in a standard fashion   Anesthetic:  1% lidocaine w/ epinephrine 1-100,000 local infiltration Curettage performed in three different directions: Yes   Curettage cycles:  3 Final wound size (cm):  1.1 Hemostasis achieved with:  ferric subsulfate Outcome: patient tolerated procedure well with no complications   Additional details:  Wound innoculated with 5 fluorouracil solution.    I, Brennen Gardiner, PA-C, have reviewed all documentation's for this visit.  The documentation on 03/15/20 for the exam, diagnosis, procedures and orders are all accurate and complete.

## 2020-03-19 NOTE — Addendum Note (Signed)
Addended by: Robyne Askew R on: 03/19/2020 04:15 PM   Modules accepted: Level of Service

## 2020-04-01 ENCOUNTER — Telehealth: Payer: Self-pay | Admitting: Internal Medicine

## 2020-04-10 NOTE — Telephone Encounter (Signed)
Error

## 2020-04-25 ENCOUNTER — Other Ambulatory Visit: Payer: Self-pay

## 2020-04-25 ENCOUNTER — Ambulatory Visit (INDEPENDENT_AMBULATORY_CARE_PROVIDER_SITE_OTHER): Payer: Medicare PPO | Admitting: Dermatology

## 2020-04-25 ENCOUNTER — Encounter: Payer: Self-pay | Admitting: Dermatology

## 2020-04-25 DIAGNOSIS — D485 Neoplasm of uncertain behavior of skin: Secondary | ICD-10-CM

## 2020-04-25 DIAGNOSIS — L820 Inflamed seborrheic keratosis: Secondary | ICD-10-CM | POA: Diagnosis not present

## 2020-04-25 DIAGNOSIS — D044 Carcinoma in situ of skin of scalp and neck: Secondary | ICD-10-CM | POA: Diagnosis not present

## 2020-04-25 DIAGNOSIS — L570 Actinic keratosis: Secondary | ICD-10-CM

## 2020-04-25 DIAGNOSIS — Z85828 Personal history of other malignant neoplasm of skin: Secondary | ICD-10-CM

## 2020-05-02 ENCOUNTER — Other Ambulatory Visit: Payer: Self-pay

## 2020-05-02 ENCOUNTER — Encounter: Payer: Self-pay | Admitting: Internal Medicine

## 2020-05-02 ENCOUNTER — Ambulatory Visit: Payer: Medicare PPO | Admitting: Internal Medicine

## 2020-05-02 VITALS — BP 102/58 | HR 72 | Temp 97.9°F | Ht 68.0 in | Wt 196.8 lb

## 2020-05-02 DIAGNOSIS — Z8739 Personal history of other diseases of the musculoskeletal system and connective tissue: Secondary | ICD-10-CM | POA: Diagnosis not present

## 2020-05-02 DIAGNOSIS — J849 Interstitial pulmonary disease, unspecified: Secondary | ICD-10-CM

## 2020-05-02 NOTE — Patient Instructions (Addendum)
ICD-10-CM   1. ILD (interstitial lung disease) ([place])  J84.9   2. History of rheumatoid arthritis  Z87.39      Suspected pulmonary fibrosis secondary to rheumatoid arthritis  Plan -In order to put the type and specific variety of pulmonary fibrosis together finished the interstitial lung disease question at today and signed release to get your latest notes from your rheumatologist Dr. Dossie Der  -Do full pulmonary function test  -Do simple walk test either today or at the time of next visit -Do overnight oxygen study on room air -Do high-resolution CT chest supine and prone  Follow-up -Return in April 2022 to see Dr. Chase Caller for a 30-minute visit but after completing the above kidney

## 2020-05-06 ENCOUNTER — Encounter: Payer: Self-pay | Admitting: Dermatology

## 2020-05-07 NOTE — Progress Notes (Signed)
Follow-Up Visit   Subjective  Nicholas Webb is a 80 y.o. male who presents for the following: Procedure (CIS x 2 Scalp SCC x 1 Scalp).  Biopsy proven skin cancers scalp plus new crusts scalp Location:  Duration:  Quality:  Associated Signs/Symptoms: Modifying Factors:  Severity:  Timing: Context:   Objective  Well appearing patient in no apparent distress; mood and affect are within normal limits. Objective  Mid Frontal Scalp: In view of the patient's remarkable tendency to get new squamous cell carcinomas, we discussed the possibility of systemic prophylaxis with either nicotinamide or Soriatane.  Side effects detailed.  Objective  Right Outer crown: 9 mm pink crust compatible with CIS.     Objective  Mid Occipital Scalp: Biopsy from 01/04/2020 showed 3 carcinomas on the scalp; all of these areas are smooth without obvious residual.  This was discussed with Nicholas Webb who is content with leaving the spots.  There are 2 new lesions, the largest of which is on the anterior vertex which will be biopsied and treated today.  Objective  Mid Frontal Scalp: Bulky 2.2cm crust.       A focused examination was performed including Head and neck. Relevant physical exam findings are noted in the Assessment and Plan.   Assessment & Plan    AK (actinic keratosis) Mid Frontal Scalp  Add over the counter nicotinamide 500 mg bid; if no side effects (not expected), minimal trial 6 months.  It may be hard to actually judge benefits.  Destruction of lesion - Mid Frontal Scalp  Neoplasm of uncertain behavior of skin Right Outer crown  Skin / nail biopsy Type of biopsy: tangential   Informed consent: discussed and consent obtained   Timeout: patient name, date of birth, surgical site, and procedure verified   Procedure prep:  Patient was prepped and draped in usual sterile fashion (Non sterile) Prep type:  Chlorhexidine Anesthesia: the lesion was anesthetized in a  standard fashion   Anesthetic:  1% lidocaine w/ epinephrine 1-100,000 local infiltration Instrument used: flexible razor blade   Outcome: patient tolerated procedure well   Post-procedure details: wound care instructions given    Destruction of lesion Complexity: simple   Destruction method: electrodesiccation and curettage   Informed consent: discussed and consent obtained   Timeout:  patient name, date of birth, surgical site, and procedure verified Anesthesia: the lesion was anesthetized in a standard fashion   Anesthetic:  1% lidocaine w/ epinephrine 1-100,000 local infiltration Curettage performed in three different directions: Yes   Curettage cycles:  3 Final wound size (cm):  1.2 Hemostasis achieved with:  ferric subsulfate Outcome: patient tolerated procedure well with no complications   Additional details:  Wound innoculated with 5 fluorouracil solution.  Specimen 2 - Surgical pathology Differential Diagnosis: bcc scc tx with bx  Check Margins: No  After shave biopsy the base of the lesion was treated with curettage plus cautery.  Personal history of skin cancer Mid Occipital Scalp  Watch biopsy sites annually along with general skin examination.  Carcinoma in situ of skin of scalp and neck Mid Frontal Scalp  Skin / nail biopsy Type of biopsy: tangential   Informed consent: discussed and consent obtained   Timeout: patient name, date of birth, surgical site, and procedure verified   Procedure prep:  Patient was prepped and draped in usual sterile fashion (Non sterile) Prep type:  Chlorhexidine Anesthesia: the lesion was anesthetized in a standard fashion   Anesthetic:  1% lidocaine w/ epinephrine  1-100,000 local infiltration Instrument used: flexible razor blade   Outcome: patient tolerated procedure well   Post-procedure details: wound care instructions given    Destruction of lesion Complexity: simple   Destruction method: electrodesiccation and curettage    Informed consent: discussed and consent obtained   Timeout:  patient name, date of birth, surgical site, and procedure verified Anesthesia: the lesion was anesthetized in a standard fashion   Anesthetic:  1% lidocaine w/ epinephrine 1-100,000 local infiltration Curettage performed in three different directions: Yes   Curettage cycles:  3 Lesion length (cm):  2.5 Lesion width (cm):  2.5 Margin per side (cm):  0 Final wound size (cm):  2.5 Hemostasis achieved with:  ferric subsulfate Outcome: patient tolerated procedure well with no complications   Additional details:  Wound innoculated with 5 fluorouracil solution.  Specimen 1 - Surgical pathology Differential Diagnosis: bcc scc tx with bx  Check Margins: No      I, Nicholas Monarch, MD, have reviewed all documentation for this visit.  The documentation on 05/07/20 for the exam, diagnosis, procedures, and orders are all accurate and complete.

## 2020-05-21 ENCOUNTER — Other Ambulatory Visit: Payer: Self-pay

## 2020-05-21 ENCOUNTER — Ambulatory Visit
Admission: RE | Admit: 2020-05-21 | Discharge: 2020-05-21 | Disposition: A | Discharge status: Home / Self Care | Payer: Medicare PPO | Source: Ambulatory Visit | Attending: Internal Medicine | Admitting: Internal Medicine

## 2020-05-21 DIAGNOSIS — J849 Interstitial pulmonary disease, unspecified: Secondary | ICD-10-CM

## 2020-05-21 DIAGNOSIS — Z8739 Personal history of other diseases of the musculoskeletal system and connective tissue: Secondary | ICD-10-CM

## 2020-05-21 DIAGNOSIS — R918 Other nonspecific abnormal finding of lung field: Secondary | ICD-10-CM | POA: Diagnosis not present

## 2020-05-24 ENCOUNTER — Ambulatory Visit: Payer: Medicare PPO | Admitting: Podiatry

## 2020-05-28 ENCOUNTER — Ambulatory Visit: Payer: Medicare PPO | Admitting: Cardiology

## 2020-05-28 DIAGNOSIS — Z79899 Other long term (current) drug therapy: Secondary | ICD-10-CM | POA: Diagnosis not present

## 2020-05-28 DIAGNOSIS — M0589 Other rheumatoid arthritis with rheumatoid factor of multiple sites: Secondary | ICD-10-CM | POA: Diagnosis not present

## 2020-05-28 DIAGNOSIS — R0602 Shortness of breath: Secondary | ICD-10-CM | POA: Diagnosis not present

## 2020-05-28 DIAGNOSIS — M8580 Other specified disorders of bone density and structure, unspecified site: Secondary | ICD-10-CM | POA: Diagnosis not present

## 2020-05-28 DIAGNOSIS — I2510 Atherosclerotic heart disease of native coronary artery without angina pectoris: Secondary | ICD-10-CM | POA: Diagnosis not present

## 2020-05-28 DIAGNOSIS — M150 Primary generalized (osteo)arthritis: Secondary | ICD-10-CM | POA: Diagnosis not present

## 2020-05-28 DIAGNOSIS — E559 Vitamin D deficiency, unspecified: Secondary | ICD-10-CM | POA: Diagnosis not present

## 2020-06-03 ENCOUNTER — Other Ambulatory Visit: Payer: Self-pay | Admitting: Cardiology

## 2020-06-03 DIAGNOSIS — I2510 Atherosclerotic heart disease of native coronary artery without angina pectoris: Secondary | ICD-10-CM

## 2020-06-10 DIAGNOSIS — G4733 Obstructive sleep apnea (adult) (pediatric): Secondary | ICD-10-CM | POA: Diagnosis not present

## 2020-06-14 ENCOUNTER — Other Ambulatory Visit (HOSPITAL_COMMUNITY)
Admission: RE | Admit: 2020-06-14 | Discharge: 2020-06-14 | Disposition: A | Discharge status: Home / Self Care | Payer: Medicare PPO | Source: Ambulatory Visit | Attending: Internal Medicine | Admitting: Internal Medicine

## 2020-06-14 DIAGNOSIS — Z20822 Contact with and (suspected) exposure to covid-19: Secondary | ICD-10-CM | POA: Diagnosis not present

## 2020-06-14 DIAGNOSIS — Z01812 Encounter for preprocedural laboratory examination: Secondary | ICD-10-CM | POA: Insufficient documentation

## 2020-06-14 LAB — SARS CORONAVIRUS 2 (TAT 6-24 HRS): SARS Coronavirus 2: NEGATIVE

## 2020-06-15 ENCOUNTER — Inpatient Hospital Stay (HOSPITAL_COMMUNITY): Admission: RE | Admit: 2020-06-15 | Payer: Medicare PPO | Source: Ambulatory Visit

## 2020-06-17 ENCOUNTER — Ambulatory Visit (INDEPENDENT_AMBULATORY_CARE_PROVIDER_SITE_OTHER): Payer: Medicare PPO | Admitting: Internal Medicine

## 2020-06-17 ENCOUNTER — Ambulatory Visit: Payer: Medicare PPO | Admitting: Internal Medicine

## 2020-06-17 ENCOUNTER — Other Ambulatory Visit: Payer: Self-pay

## 2020-06-17 DIAGNOSIS — Z8739 Personal history of other diseases of the musculoskeletal system and connective tissue: Secondary | ICD-10-CM

## 2020-06-17 DIAGNOSIS — J849 Interstitial pulmonary disease, unspecified: Secondary | ICD-10-CM | POA: Diagnosis not present

## 2020-06-17 LAB — PULMONARY FUNCTION TEST
DL/VA % pred: 96 %
DL/VA: 3.8 ml/min/mmHg/L
DLCO cor % pred: 78 %
DLCO cor: 18.32 ml/min/mmHg
DLCO unc % pred: 78 %
DLCO unc: 18.32 ml/min/mmHg
FEF 25-75 Post: 3.79 L/sec
FEF 25-75 Pre: 3.22 L/sec
FEF2575-%Change-Post: 17 %
FEF2575-%Pred-Post: 200 %
FEF2575-%Pred-Pre: 170 %
FEV1-%Change-Post: 4 %
FEV1-%Pred-Post: 116 %
FEV1-%Pred-Pre: 111 %
FEV1-Post: 3.14 L
FEV1-Pre: 3.01 L
FEV1FVC-%Change-Post: 3 %
FEV1FVC-%Pred-Pre: 112 %
FEV6-%Change-Post: 1 %
FEV6-%Pred-Post: 106 %
FEV6-%Pred-Pre: 105 %
FEV6-Post: 3.75 L
FEV6-Pre: 3.71 L
FEV6FVC-%Pred-Post: 107 %
FEV6FVC-%Pred-Pre: 107 %
FVC-%Change-Post: 1 %
FVC-%Pred-Post: 99 %
FVC-%Pred-Pre: 98 %
FVC-Post: 3.75 L
FVC-Pre: 3.71 L
Post FEV1/FVC ratio: 84 %
Post FEV6/FVC ratio: 100 %
Pre FEV1/FVC ratio: 81 %
Pre FEV6/FVC Ratio: 100 %
RV % pred: 67 %
RV: 1.68 L
TLC % pred: 79 %
TLC: 5.3 L

## 2020-06-17 NOTE — Progress Notes (Signed)
Full PFT performed today. °

## 2020-06-17 NOTE — Patient Instructions (Signed)
Pulmonary function test performed today. 

## 2020-06-18 ENCOUNTER — Ambulatory Visit: Payer: Medicare PPO | Admitting: Podiatry

## 2020-06-18 ENCOUNTER — Other Ambulatory Visit: Payer: Self-pay

## 2020-06-18 ENCOUNTER — Encounter: Payer: Self-pay | Admitting: Podiatry

## 2020-06-18 DIAGNOSIS — B351 Tinea unguium: Secondary | ICD-10-CM | POA: Diagnosis not present

## 2020-06-18 DIAGNOSIS — D689 Coagulation defect, unspecified: Secondary | ICD-10-CM | POA: Diagnosis not present

## 2020-06-18 DIAGNOSIS — M79676 Pain in unspecified toe(s): Secondary | ICD-10-CM

## 2020-06-18 NOTE — Progress Notes (Signed)
This patient returns to my office for at risk foot care.  This patient requires this care by a professional since this patient will be at risk due to having coagulation defect.  Patient is taking eliquis.  This patient is unable to cut nails himself since the patient cannot reach his nails.These nails are painful walking and wearing shoes.  This patient presents for at risk foot care today.  General Appearance  Alert, conversant and in no acute stress.  Vascular  Dorsalis pedis and posterior tibial  pulses are palpable  bilaterally.  Capillary return is within normal limits  bilaterally. Temperature is within normal limits  bilaterally.  Neurologic  Senn-Weinstein monofilament wire test within normal limits  bilaterally. Muscle power within normal limits bilaterally.  Nails Thick disfigured discolored nails with subungual debris  from hallux to fifth toes bilaterally. No evidence of bacterial infection or drainage bilaterally.  Orthopedic  No limitations of motion  feet .  No crepitus or effusions noted.  No bony pathology or digital deformities noted.  DJD 1st MPJ right foot.  Skin  normotropic skin with no porokeratosis noted bilaterally.  No signs of infections or ulcers noted.     Onychomycosis  Pain in right toes  Pain in left toes  Consent was obtained for treatment procedures.   Mechanical debridement of nails 1-5  bilaterally performed with a nail nipper.  Filed with dremel without incident.    Return office visit    4 months                 Told patient to return for periodic foot care and evaluation due to potential at risk complications.   Gardiner Barefoot DPM

## 2020-07-04 ENCOUNTER — Other Ambulatory Visit: Payer: Self-pay

## 2020-07-04 ENCOUNTER — Encounter: Payer: Self-pay | Admitting: Dermatology

## 2020-07-04 ENCOUNTER — Ambulatory Visit (INDEPENDENT_AMBULATORY_CARE_PROVIDER_SITE_OTHER): Payer: Medicare PPO | Admitting: Dermatology

## 2020-07-04 DIAGNOSIS — L570 Actinic keratosis: Secondary | ICD-10-CM

## 2020-07-04 DIAGNOSIS — D485 Neoplasm of uncertain behavior of skin: Secondary | ICD-10-CM | POA: Diagnosis not present

## 2020-07-04 DIAGNOSIS — L989 Disorder of the skin and subcutaneous tissue, unspecified: Secondary | ICD-10-CM | POA: Diagnosis not present

## 2020-07-04 NOTE — Patient Instructions (Signed)

## 2020-07-08 ENCOUNTER — Other Ambulatory Visit: Payer: Self-pay

## 2020-07-08 ENCOUNTER — Encounter: Payer: Self-pay | Admitting: Cardiology

## 2020-07-08 ENCOUNTER — Ambulatory Visit: Payer: Medicare PPO | Admitting: Cardiology

## 2020-07-08 VITALS — BP 104/60 | HR 89 | Ht 68.0 in | Wt 198.0 lb

## 2020-07-08 DIAGNOSIS — I2699 Other pulmonary embolism without acute cor pulmonale: Secondary | ICD-10-CM

## 2020-07-08 DIAGNOSIS — I2510 Atherosclerotic heart disease of native coronary artery without angina pectoris: Secondary | ICD-10-CM

## 2020-07-08 NOTE — Patient Instructions (Signed)
Medication Instructions:  The current medical regimen is effective;  continue present plan and medications.  *If you need a refill on your cardiac medications before your next appointment, please call your pharmacy*  Follow-Up: At [facility], you and your health needs are our priority.  As part of our continuing mission to provide you with exceptional heart care, we have created designated Provider Care Teams.  These Care Teams include your primary Cardiologist (physician) and Advanced Practice Providers (APPs -  Physician Assistants and Nurse Practitioners) who all work together to provide you with the care you need, when you need it.  We recommend signing up for the patient portal called "MyChart".  Sign up information is provided on this After Visit Summary.  MyChart is used to connect with patients for Virtual Visits (Telemedicine).  Patients are able to view lab/test results, encounter notes, upcoming appointments, etc.  Non-urgent messages can be sent to your provider as well.   To learn more about what you can do with MyChart, go to [email or web address].    Your next appointment:   12 month(s)  The format for your next appointment:   In Person  Provider:   Mark Skains, MD   Thank you for choosing [facility]!!      

## 2020-07-08 NOTE — Progress Notes (Signed)
Cardiology Office Note:    Date:  07/08/2020   ID:  Nicholas Webb, DOB 07-Sep-1940, MRN [id number]  PCP:  Maurice Small, MD   [facility] Providers Cardiologist:  Candee Furbish, MD     Referring MD: Maurice Small, MD   History of Present Illness:    Nicholas Webb is a 80 y.o. male here for coronary arterial disease and hyperlipidemia.  Has seen Dr. Chase Caller with pulmonary for suspected pulmonary fibrosis secondary to rheumatoid arthritis.  He had a colonoscopy scheduled for 12/07/2019.    Had a stent placement to his LAD on 06/24/2018.  This was complicated by acknowledgment of a skull mass.  The skull mass on its own, decreased in size.  Possible infection.  No biopsy was needed by neurosurgery.  Plavix was stopped because of concomitant Eliquis use for bilateral PE.  Previously had a syncopal episode and pulmonary embolism was discovered.  He has been taking Eliquis.  Oncology has been following.  IVC filter has been removed.  Works at the [employer].  Overall he has been doing quite well.  Not having any anginal symptoms no shortness of breath.  Ambulating well.  No recurrence of thrombosis.  Reviewed Dr. Gearldine Shown note from oncology on 06/16/2019.  IVC filter has been removed once again.  Today, he reports that when he reaches or leans over he has some soreness and pain on the left side of his axillary region.  Maybe notices it when he is stretching getting in and out of a car.  He completes physical activity but denies any formal exercise. He recently joined a fitness facility and has some excitement.  Now a member at [employer].  His colonoscopy went well last year, he has no complaints. He denies having any exertional shortness of breath, chest pain, tightness, or pressure. He has no lightheadedness, PND, or orthopnea.   Past Medical History:  Diagnosis Date  . Anemia of chronic disease   . Atherosclerosis of both carotid arteries   . Atypical mole 03/28/2013    moderate atypia on mid back  . Barrett's esophagus   . Basal cell carcinoma 12/17/2015   superficial on right postauricular - CX3+5FU  . Dizziness   . GERD (gastroesophageal reflux disease)   . Hx of adenomatous colonic polyps   . Hyperlipidemia   . Macrocytosis without anemia   . Obesity   . OSA (obstructive sleep apnea)   . Rheumatoid arthritis(714.0)   . Squamous cell carcinoma of skin 02/25/2010   in situ on left sideburn, inferior - CX3+5FU  . Squamous cell carcinoma of skin 02/25/2010   well differentiated on right sideburn - CX3+5FU  . Squamous cell carcinoma of skin 03/24/2011   in situ on right temple - CX3+5FU  . Squamous cell carcinoma of skin 03/24/2011   in situ on right jawline - CX3+5FU  . Squamous cell carcinoma of skin 03/24/2011   in situ on right angle of jaw - CX3+5FU  . Squamous cell carcinoma of skin 05/05/2011   left temple - CX3+5FU  . Squamous cell carcinoma of skin 11/28/2013   in situ on mid scalp - tx p bx  . Squamous cell carcinoma of skin 12/17/2015   in situ on right post crown - CX3+5FU  . Squamous cell carcinoma of skin 12/17/2015   verruca vulgaris with squamous atypia on right thumb - CX3+5FU  . Squamous cell carcinoma of skin 01/03/2016   in situ on right crown post inf  .  Squamous cell carcinoma of skin 04/22/2016   well differentiated on left outer low leg  . Squamous cell carcinoma of skin 10/06/2016   in situ on right subauricular - CX3+Cautery+5FU  . Squamous cell carcinoma of skin 12/22/2016   in situ on mid post crown - tx p bx  . Squamous cell carcinoma of skin 01/11/2018   in situ on right crown - tx p bx  . Squamous cell carcinoma of skin 01/11/2018   in situ on mid crown - tx p bx  . Squamous cell carcinoma of skin 01/11/2018   in situ on left crown - tx p bx  . Squamous cell carcinoma of skin 01/11/2018   well differentiated on right temple - tx p bx  . Squamous cell carcinoma of skin 03/07/2019   well differentiated on  right neck - tx p bx  . Squamous cell carcinoma of skin 03/07/2019   in situ on right temple - tx p bx  . Squamous cell carcinoma of skin 03/07/2019   in situ on mid forehead - tx p bx  . Squamous cell carcinoma of skin 01/04/2020   well diff- mid parietal scalp (CX35FU)  . Squamous cell carcinoma of skin 01/04/2020   in situ- right scalp superior (CX35FU)  . Squamous cell carcinoma of skin 01/04/2020   in situ-legt parietal scalp (CX35FU)   . Syncope     Past Surgical History:  Procedure Laterality Date  . CORONARY STENT INTERVENTION N/A 06/24/2018   Procedure: CORONARY STENT INTERVENTION;  Surgeon: Burnell Blanks, MD;  Location: [facility];  Service: Cardiovascular;  Laterality: N/A;  . IR IVC FILTER PLMT / S&I [place] Keels GUID/MOD SED  10/07/2018  . IR IVC FILTER RETRIEVAL / S&I /IMG GUID/MOD SED  06/15/2019  . IR RADIOLOGIST EVAL & MGMT  09/27/2018  . IR RADIOLOGIST EVAL & MGMT  01/04/2019  . JOINT REPLACEMENT  2004   left  . LEFT HEART CATH AND CORONARY ANGIOGRAPHY N/A 06/24/2018   Procedure: LEFT HEART CATH AND CORONARY ANGIOGRAPHY;  Surgeon: Burnell Blanks, MD;  Location: [facility];  Service: Cardiovascular;  Laterality: N/A;    Current Medications: Current Meds  Medication Sig  . atorvastatin (LIPITOR) 40 MG tablet TAKE 1 TABLET(40 MG) BY MOUTH DAILY  . ELIQUIS 5 MG TABS tablet TAKE 1 TABLET(5 MG) BY MOUTH TWICE DAILY  . esomeprazole (NEXIUM) 20 MG capsule Take 20 mg by mouth daily at 12 noon.   . folic acid (FOLVITE) 1 MG tablet Take 3 mg by mouth daily.  . methotrexate (RHEUMATREX) 2.5 MG tablet Take 15 mg by mouth See admin instructions. Take 6 tablets by mouth on Sunday and Monday  . Multiple Vitamins-Minerals (MULTIVITAMIN WITH MINERALS) tablet Take 1 tablet by mouth daily.  . predniSONE (DELTASONE) 5 MG tablet 1 tablet     Allergies:   Patient has no known allergies.   Social History   Socioeconomic History  . Marital status: Married     Spouse name: Katharine Look  . Number of children: 2  . Years of education: Not on file  . Highest education level: Bachelor's degree (e.g., BA, AB, BS)  Occupational History    Employer: [employer]  Tobacco Use  . Smoking status: Never Smoker  . Smokeless tobacco: Never Used  Vaping Use  . Vaping Use: Never used  Substance and Sexual Activity  . Alcohol use: Yes    Alcohol/week: 1.0 standard drink    Types: 1 Glasses  of wine per week    Comment: OCCASION  . Drug use: No  . Sexual activity: Not on file  Other Topics Concern  . Not on file  Social History Narrative   Patient is right-handed.  He lives with his wife in a one level home. He does not exercise.    Social Determinants of Health   Financial Resource Strain: Not on file  Food Insecurity: Not on file  Transportation Needs: Not on file  Physical Activity: Not on file  Stress: Not on file  Social Connections: Not on file     Family History: The patient's family history includes Cancer in his father; Colon cancer in his father; Glaucoma in his mother; Prostate cancer in his father; Stroke in his mother.  ROS:   Please see the history of present illness. All other systems reviewed and are negative.  EKGs/Labs/Other Studies Reviewed:    The following studies were reviewed today: ECHO 06/2018 1. The left ventricle has normal systolic function, with an ejection  fraction of 55-60%. The cavity size was normal. Left ventricular diastolic  Doppler parameters are indeterminate. No evidence of left ventricular  regional wall motion abnormalities.  2. The right ventricle has normal systolic function. The cavity was  mildly enlarged. There is no increase in right ventricular wall thickness.  Right ventricular systolic pressure moderate to severely elevated with an  estimated pressure of 51.7 mmHg.  3. The aortic valve is tricuspid. Aortic valve regurgitation is trivial  by color flow Doppler.  4. The inferior  vena cava was dilated in size with <50% respiratory  variability.    EKG:   07/08/20-EKG was not ordered today.  Recent Labs: No results found for requested labs within last 8760 hours.  Recent Lipid Panel    Component Value Date/Time   CHOL 100 06/13/2019 1055   TRIG 161 (H) 06/13/2019 1055   HDL 41 06/13/2019 1055   CHOLHDL 2.4 06/13/2019 1055   CHOLHDL 3.4 06/23/2018 1933   VLDL 7 06/23/2018 1933   LDLCALC 32 06/13/2019 1055     Physical Exam:    VS:  BP 104/60 (BP Location: Left Arm, Patient Position: Sitting, Cuff Size: Normal)   Pulse 89   Ht 5\' 8"  (1.727 m)   Wt 198 lb (89.8 kg)   SpO2 96%   BMI 30.11 kg/m     Wt Readings from Last 3 Encounters:  07/08/20 198 lb (89.8 kg)  05/02/20 196 lb 12.8 oz (89.3 kg)  01/01/20 192 lb 8 oz (87.3 kg)     GEN: Well nourished, well developed in no acute distress HEENT: Normal NECK: No JVD; No carotid bruits LYMPHATICS: No lymphadenopathy CARDIAC: RRR, no murmurs, rubs, gallops RESPIRATORY:  Clear to auscultation without rales, wheezing or rhonchi  ABDOMEN: Soft, non-tender, non-distended MUSCULOSKELETAL:  No edema; No deformity  SKIN: Warm and dry NEUROLOGIC:  Alert and oriented x 3 PSYCHIATRIC:  Normal affect   ASSESSMENT:    1. Coronary artery disease involving native coronary artery of native heart without angina pectoris   2. Bilateral pulmonary embolism (HCC)    PLAN:    In order of problems listed above:  Coronary artery disease - Stable without any anginal symptoms.  LDL 51 hemoglobin 12.7 creatinine 0.8.  LAD stent 2020.  Skull mass - Had decreased in size, appreciate Dr. Ronnald Ramp previously following.  He is currently having skin biopsy with dermatology.  Prior unprovoked pulmonary embolism - IVC filter removed.  Continuing with Eliquis.  Dr.  Sherrill reviewed.  Possible pulmonary fibrosis - Being seen by Dr. Chase Caller.  PFTs, further testing.  CT scan high resolution on 05/21/2020 showed moderate  pulmonary fibrosis.  Secondary pulmonary hypertension noted on echocardiogram previously, 51 mmHg  Right bundle branch block - Stable chronic without any syncopal symptoms.  Follow up in 1 year  Medication Adjustments/Labs and Tests Ordered: Current medicines are reviewed at length with the patient today.  Concerns regarding medicines are outlined above.  No orders of the defined types were placed in this encounter.  No orders of the defined types were placed in this encounter.   Patient Instructions  Medication Instructions:  The current medical regimen is effective;  continue present plan and medications.  *If you need a refill on your cardiac medications before your next appointment, please call your pharmacy*  Follow-Up: At [facility], you and your health needs are our priority.  As part of our continuing mission to provide you with exceptional heart care, we have created designated Provider Care Teams.  These Care Teams include your primary Cardiologist (physician) and Advanced Practice Providers (APPs -  Physician Assistants and Nurse Practitioners) who all work together to provide you with the care you need, when you need it.  We recommend signing up for the patient portal called "MyChart".  Sign up information is provided on this After Visit Summary.  MyChart is used to connect with patients for Virtual Visits (Telemedicine).  Patients are able to view lab/test results, encounter notes, upcoming appointments, etc.  Non-urgent messages can be sent to your provider as well.   To learn more about what you can do with MyChart, go to [email or web address].    Your next appointment:   12 month(s)  The format for your next appointment:   In Person  Provider:   Candee Furbish, MD   Thank you for choosing [facility]!!         I,Alexis Bryant,acting as a scribe for Candee Furbish, MD.,have documented all relevant documentation on the behalf of Candee Furbish, MD,as  directed by  Candee Furbish, MD while in the presence of Candee Furbish, MD.  I, Candee Furbish, MD, have reviewed all documentation for this visit. The documentation on 07/08/20 for the exam, diagnosis, procedures, and orders are all accurate and complete. Signed, Candee Furbish, MD  07/08/2020 9:41 AM    [facility]

## 2020-07-09 ENCOUNTER — Telehealth: Payer: Self-pay | Admitting: *Deleted

## 2020-07-09 NOTE — Telephone Encounter (Signed)
-----   Message from Nicholas Monarch, MD sent at 07/08/2020  4:10 PM EDT ----- Please inform patient that while biopsy did not show skin cancer, I purposely did not do a very deep biopsy.  If this were to significantly regrow, I do want to take another look at it.

## 2020-07-09 NOTE — Telephone Encounter (Signed)
Path to patient. Lesion is still pretty sore and growing like a volcano. Made a follow up appointment to have Dr. Denna Haggard look at it.

## 2020-07-11 ENCOUNTER — Ambulatory Visit: Payer: Medicare PPO | Admitting: Internal Medicine

## 2020-07-15 ENCOUNTER — Ambulatory Visit (INDEPENDENT_AMBULATORY_CARE_PROVIDER_SITE_OTHER): Payer: Medicare PPO | Admitting: Dermatology

## 2020-07-15 ENCOUNTER — Other Ambulatory Visit: Payer: Self-pay

## 2020-07-15 DIAGNOSIS — C4442 Squamous cell carcinoma of skin of scalp and neck: Secondary | ICD-10-CM | POA: Diagnosis not present

## 2020-07-15 DIAGNOSIS — D485 Neoplasm of uncertain behavior of skin: Secondary | ICD-10-CM

## 2020-07-15 NOTE — Patient Instructions (Signed)

## 2020-07-17 ENCOUNTER — Encounter: Payer: Self-pay | Admitting: Internal Medicine

## 2020-07-17 ENCOUNTER — Ambulatory Visit: Payer: Medicare PPO | Admitting: Internal Medicine

## 2020-07-17 ENCOUNTER — Other Ambulatory Visit: Payer: Self-pay

## 2020-07-17 VITALS — BP 118/70 | HR 73 | Temp 98.2°F | Ht 68.0 in | Wt 196.0 lb

## 2020-07-17 DIAGNOSIS — R918 Other nonspecific abnormal finding of lung field: Secondary | ICD-10-CM | POA: Diagnosis not present

## 2020-07-17 DIAGNOSIS — J849 Interstitial pulmonary disease, unspecified: Secondary | ICD-10-CM | POA: Diagnosis not present

## 2020-07-17 DIAGNOSIS — J8489 Other specified interstitial pulmonary diseases: Secondary | ICD-10-CM | POA: Diagnosis not present

## 2020-07-17 DIAGNOSIS — M359 Systemic involvement of connective tissue, unspecified: Secondary | ICD-10-CM | POA: Diagnosis not present

## 2020-07-17 DIAGNOSIS — Z8739 Personal history of other diseases of the musculoskeletal system and connective tissue: Secondary | ICD-10-CM | POA: Diagnosis not present

## 2020-07-17 NOTE — Progress Notes (Signed)
OV 07/17/2020  Subjective:  Patient ID: Nicholas Webb, male , DOB: [birth year] , age 80 y.o. , MRN: [id number] , ADDRESS: [address] [place] [address] PCP Maurice Small, MD Patient Care Team: Maurice Small, MD as PCP - General (Family Medicine) Jerline Pain, MD as PCP - Cardiology (Cardiology) Warren Danes, PA-C as Physician Assistant (Dermatology) [name], MD as Consulting Physician (Dermatology)  This Provider for this visit: Treatment Team:  Attending Provider: Brand Males, MD    07/17/2020 -   Chief Complaint  Patient presents with  . Follow-up    PFT performed 06/17/20. Pt states he is about the same since last visit.   Rheumatoid arthritis with interstitial lung disease History of pulmonary embolism on Eliquis - since April 2020 History of coronary artery disease follows Dr. Marlou Porch  HPI Nicholas Webb 80 y.o. -presents for follow-up.  He is accompanied by his daughter.  This visit is to focus on discussing the results.  His symptoms are extremely mild from a respiratory standpoint documented below.  His high-resolution CT chest shows interstitial lung disease probable UIP pattern.  He also has multiple small pulmonary nodules.  There is coronary artery calcification [he is known to have coronary artery disease and follows Dr. Marlou Porch.  His pulmonary function test shows very mild restriction from his ILD.  His walking desaturation test is essentially close to normal except for tachycardia.  Taken together the burden of ILD is mild.  His daughter wants to know how to approach his ILD.  They report that he was on methotrexate for many years [greater than 10].  There was wheezing and that is why he was referred to [place].  He is currently off methotrexate.  He started working out at [employer] well  [place] - ILD 07/17/2020   O2 use ra  Shortness of Breath 0 -> 5 scale with 5 being worst (score 6 If unable to do)  At rest 0  Simple tasks -  showers, clothes change, eating, shaving 0  Household (dishes, doing bed, laundry) 1  Shopping 0  Walking level at own pace 0  Walking up Stairs 1  Total (30-36) Dyspnea Score 2  How bad is your cough? minimal  How bad is your fatigue 0  How bad is nausea 0  How bad is vomiting?  0  How bad is diarrhea? 0  How bad is anxiety? 00  How bad is depression 0        HRCT Chest data march 2022 CLINICAL DATA:  Interstitial lung disease, history of rheumatoid arthritis  EXAM: CT CHEST WITHOUT CONTRAST  TECHNIQUE: Multidetector CT imaging of the chest was performed following the standard protocol without intravenous contrast. High resolution imaging of the lungs, as well as inspiratory and expiratory imaging, was performed.  COMPARISON:  None.  FINDINGS: Cardiovascular: Aortic atherosclerosis. Normal heart size. Left coronary artery calcifications and stents. No pericardial effusion.  Mediastinum/Nodes: No enlarged mediastinal, hilar, or axillary lymph nodes. Thyroid gland, trachea, and esophagus demonstrate no significant findings.  Lungs/Pleura: There is moderate pulmonary fibrosis in a pattern with apical to basal gradient featuring irregular peripheral interstitial opacity, septal thickening, mild traction bronchiectasis, areas of subpleural bronchiolectasis without clear evidence of honeycombing. No significant air trapping on expiratory phase imaging. There multiple small pulmonary nodules in the bilateral lung apices, generally irregular in morphology and measuring up to 5 mm, for example in the left apex (series 10, image 58). No pleural effusion  or pneumothorax.  Upper Abdomen: No acute abnormality.  Musculoskeletal: No chest wall mass or suspicious bone lesions identified.  IMPRESSION: 1. There is moderate pulmonary fibrosis in a pattern with apical to basal gradient featuring irregular peripheral interstitial opacity, septal thickening, mild  traction bronchiectasis, and areas of subpleural bronchiolectasis without clear evidence of honeycombing. Findings are consistent with a "probable UIP" pattern and generally in keeping with rheumatoid associated interstitial lung disease. Findings are categorized as probable UIP per consensus guidelines: Diagnosis of Idiopathic Pulmonary Fibrosis: An Official ATS/ERS/JRS/ALAT Clinical Practice Guideline. [place], Iss 5, [phone], Oct 31 2016. 2. There multiple small nonspecific pulmonary nodules in the bilateral lung apices, generally irregular in morphology and measuring 5 mm and smaller. No follow-up needed if patient is low-risk (and has no known or suspected primary neoplasm). Non-contrast chest CT can be considered in 12 months if patient is high-risk. This recommendation follows the consensus statement: Guidelines for Management of Incidental Pulmonary Nodules Detected on CT Images: From the Fleischner Society 2017; Radiology 2017; 284:228-243. 3. Coronary artery disease.  Aortic Atherosclerosis (ICD10-I70.0).   Electronically Signed   By: Eddie Candle M.D.   On: 05/22/2020 11:58    Simple office walk 185 feet x  3 laps goal with forehead probe 07/17/2020   O2 used ra  Number laps completed 3  Comments about pace avg  Resting Pulse Ox/HR 98% and 74/min  Final Pulse Ox/HR 96% and 108/min  Desaturated </= 88% no  Desaturated <= 3% points no  Got Tachycardic >/= 90/min yes  Symptoms at end of test none  Miscellaneous comments x      PFT  PFT Results Latest Ref Rng & Units 06/17/2020  FVC-Pre L 3.71  FVC-Predicted Pre % 98  FVC-Post L 3.75  FVC-Predicted Post % 99  Pre FEV1/FVC % % 81  Post FEV1/FCV % % 84  FEV1-Pre L 3.01  FEV1-Predicted Pre % 111  FEV1-Post L 3.14  DLCO uncorrected ml/min/mmHg 18.32  DLCO UNC% % 78  DLCO corrected ml/min/mmHg 18.32  DLCO COR %Predicted % 78  DLVA Predicted % 96  TLC L 5.30  TLC % Predicted  % 79  RV % Predicted % 67       has a past medical history of Anemia of chronic disease, Atherosclerosis of both carotid arteries, Atypical mole (03/28/2013), Barrett's esophagus, Basal cell carcinoma (12/17/2015), Dizziness, GERD (gastroesophageal reflux disease), adenomatous colonic polyps, Hyperlipidemia, Macrocytosis without anemia, Obesity, OSA (obstructive sleep apnea), Rheumatoid arthritis(714.0), Squamous cell carcinoma of skin (02/25/2010), Squamous cell carcinoma of skin (02/25/2010), Squamous cell carcinoma of skin (03/24/2011), Squamous cell carcinoma of skin (03/24/2011), Squamous cell carcinoma of skin (03/24/2011), Squamous cell carcinoma of skin (05/05/2011), Squamous cell carcinoma of skin (11/28/2013), Squamous cell carcinoma of skin (12/17/2015), Squamous cell carcinoma of skin (12/17/2015), Squamous cell carcinoma of skin (01/03/2016), Squamous cell carcinoma of skin (04/22/2016), Squamous cell carcinoma of skin (10/06/2016), Squamous cell carcinoma of skin (12/22/2016), Squamous cell carcinoma of skin (01/11/2018), Squamous cell carcinoma of skin (01/11/2018), Squamous cell carcinoma of skin (01/11/2018), Squamous cell carcinoma of skin (01/11/2018), Squamous cell carcinoma of skin (03/07/2019), Squamous cell carcinoma of skin (03/07/2019), Squamous cell carcinoma of skin (03/07/2019), Squamous cell carcinoma of skin (01/04/2020), Squamous cell carcinoma of skin (01/04/2020), Squamous cell carcinoma of skin (01/04/2020), and Syncope.   reports that he has never smoked. He has never used smokeless tobacco.  Past Surgical History:  Procedure Laterality Date  . CORONARY STENT INTERVENTION N/A 06/24/2018  Procedure: CORONARY STENT INTERVENTION;  Surgeon: Burnell Blanks, MD;  Location: [facility];  Service: Cardiovascular;  Laterality: N/A;  . IR IVC FILTER PLMT / S&I [facility]els GUID/MOD SED  10/07/2018  . IR IVC FILTER RETRIEVAL / S&I /IMG GUID/MOD SED  06/15/2019  . IR  RADIOLOGIST EVAL & MGMT  09/27/2018  . IR RADIOLOGIST EVAL & MGMT  01/04/2019  . JOINT REPLACEMENT  2004   left  . LEFT HEART CATH AND CORONARY ANGIOGRAPHY N/A 06/24/2018   Procedure: LEFT HEART CATH AND CORONARY ANGIOGRAPHY;  Surgeon: Burnell Blanks, MD;  Location: [facility];  Service: Cardiovascular;  Laterality: N/A;    No Known Allergies  Immunization History  Administered Date(s) Administered  . Influenza, High Dose Seasonal PF 12/18/2013, 12/18/2014, 12/13/2017  . Influenza, Quadrivalent, Recombinant, Inj, Pf 12/14/2016, 12/07/2018  . Influenza-Unspecified 12/01/2012  . PFIZER(Purple Top)SARS-COV-2 Vaccination 03/23/2019, 04/13/2019  . Tdap 06/23/2018  . Zoster Recombinat (Shingrix) 05/08/2017, 07/09/2017    Family History  Problem Relation Age of Onset  . Stroke Mother   . Glaucoma Mother   . Cancer Father   . Prostate cancer Father   . Colon cancer Father      Current Outpatient Medications:  .  atorvastatin (LIPITOR) 40 MG tablet, TAKE 1 TABLET(40 MG) BY MOUTH DAILY, Disp: 90 tablet, Rfl: 1 .  ELIQUIS 5 MG TABS tablet, TAKE 1 TABLET(5 MG) BY MOUTH TWICE DAILY, Disp: 60 tablet, Rfl: 5 .  esomeprazole (NEXIUM) 20 MG capsule, Take 20 mg by mouth daily at 12 noon. , Disp: , Rfl:  .  Multiple Vitamins-Minerals (MULTIVITAMIN WITH MINERALS) tablet, Take 1 tablet by mouth daily., Disp: , Rfl:  .  predniSONE (DELTASONE) 5 MG tablet, 1 tablet, Disp: , Rfl:  .  folic acid (FOLVITE) 1 MG tablet, Take 3 mg by mouth daily. (Patient not taking: Reported on 07/17/2020), Disp: , Rfl:  .  methotrexate (RHEUMATREX) 2.5 MG tablet, Take 15 mg by mouth See admin instructions. Take 6 tablets by mouth on Sunday and Monday (Patient not taking: Reported on 07/17/2020), Disp: , Rfl:       Objective:   Vitals:   07/17/20 1520  BP: 118/70  Pulse: 73  Temp: 98.2 F (36.8 C)  TempSrc: Temporal  SpO2: 96%  Weight: 196 lb (88.9 kg)  Height: 5\' 8"  (1.727 m)    Estimated  body mass index is 29.8 kg/m as calculated from the following:   Height as of this encounter: 5\' 8"  (1.727 m).   Weight as of this encounter: 196 lb (88.9 kg).  @WEIGHTCHANGE @  [employer]   07/17/20 1520  Weight: 196 lb (88.9 kg)     Physical Exam   General: No distress. Looks well Neuro: Alert and Oriented x 3. GCS 15. Speech normal Psych: Pleasant Resp:  Barrel Chest - no.  Wheeze - no, Crackles - yes at as, No overt respiratory distress CVS: Normal heart sounds. Murmurs - no Ext: Stigmata of Connective Tissue Disease - RA HEENT: Normal upper airway. PEERL +. No post nasal drip        Assessment:       ICD-10-CM   1. Interstitial lung disease due to connective tissue disease ([facility])  J84.89    M35.9   2. ILD (interstitial lung disease) ([facility])  J84.9 CANCELED: Pulmonary function test    CANCELED: Pulse oximetry, overnight    CANCELED: CT CHEST HIGH RESOLUTION  3. History of rheumatoid arthritis  Z87.39   4.  Multiple lung nodules on CT  R91.8 Pulmonary function test   The current approach to ILD and presence of interstitial lung disease [non-- IPF ILD] is to treat with nintedanib antifibrotic in the case of progression which is FVC decline greater than 5% and or DLCO decline greater than 10% on CT scan evidence of decline in a span of 1 or 2 years.  At present he is mild disease.  Therefore antifibrotic or any medication designed to inhibit the progression of disease [pulmonary fibrosis] is not indicated.  This is because of significant variability and difficult to predict who will decline versus who was not declined.  His CT scan is not definitive UIP therefore it is hard to gauge the risk for decline.  In addition nintedanib [[id number]) has side effect of diarrhea and some concern in the presence of anticoagulation or coronary artery disease.  Therefore is best avoid this.  In terms of immunomodulators there is evidence that Imuran and CellCept can be lung protective.  But again  is indicated only if there is progressive ILD.  Between these 2 CellCept does not address joint issues.  With methotrexate being a concern for increasing the risk of ILD especially because he is being greater than 10 years Imuran as an option for his joints and could have bilateral beneficial effect on his lungs although again for the lungs as indicated only if there is progression.  Had this with him and his daughter    Plan:     Patient Instructions     ICD-10-CM   1. Interstitial lung disease due to connective tissue disease ([facility])  J84.89    M35.9   2. ILD (interstitial lung disease) ([facility])  J84.9 Pulmonary function test    Pulse oximetry, overnight    CT CHEST HIGH RESOLUTION  3. History of rheumatoid arthritis  Z87.39   4. 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   - is currently mild based on breathing test and walk test and symptoms  - CT pattern does not  Predict definite risk for progressin  Multiple small lung nodules probably due to rheumatoid arthritis (March 2020)]  Plan -Current literature supports antifibrotic therapy [either through direct antifibrotic nintedanib +/-  Immunomodulator such as Imuran or CellCept) only in the event of progression `-Therefore best course for the the lung is to adopt a wait and watch approach -Do spirometry and DLCO in 6 months -Avoid respiratory viruses, smoke exposure and high altitude in order to prevent flareups in the lung -In terms of treating the rheumatoid arthritis alternatives to methotrexate with can be Imuran which you can discuss with your rheumatologist (Imuran can help her joints but also potentially the lung]  -Do not recommend CellCept at this point - Contiue sagwell exercise   Follow-up - Return in 6 months for a 30-minute visit -but after breathing test  -Symptom score and walking desaturation test at follow-up  ( Level 05 visit: Estb 40-54 min   in  visit type:  on-site physical face to visit  in total care time and counseling or/and coordination of care by this undersigned MD - Dr Kalman ShanMurali Tonea Leiphart. This includes one or more of the following on this same day 07/17/2020: pre-charting, chart review, note writing, documentation discussion of test results, diagnostic or treatment recommendations, prognosis, risks and benefits of management options, instructions, education, compliance or risk-factor reduction. It excludes time spent by the CMA or office staff in the care  of the patient. Actual time 41 min)    SIGNATURE    Dr. Brand Males, M.D., F.C.C.P,  Pulmonary and Critical Care Medicine Staff Physician, [facility] Director - Interstitial Lung Disease  Program  P[facility] [place], [place], [address]  Pager: [phone], If no answer or between  15:00h - 7:00h: call [phone] Telephone: [phone]  4:15 PM 07/17/2020

## 2020-07-17 NOTE — Patient Instructions (Addendum)
ICD-10-CM   1. Interstitial lung disease due to connective tissue disease ([facility])  J84.89    M35.9   2. ILD (interstitial lung disease) ([facility])  J84.9 Pulmonary function test    Pulse oximetry, overnight    CT CHEST HIGH RESOLUTION  3. History of rheumatoid arthritis  Z87.39   4. 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   - is currently mild based on breathing test and walk test and symptoms  - CT pattern does not  Predict definite risk for progressin  Multiple small lung nodules probably due to rheumatoid arthritis (March 2020)]  Plan -Current literature supports antifibrotic therapy [either through direct antifibrotic nintedanib +/-  Immunomodulator such as Imuran or CellCept) only in the event of progression `-Therefore best course for the the lung is to adopt a wait and watch approach -Do spirometry and DLCO in 6 months -Avoid respiratory viruses, smoke exposure and high altitude in order to prevent flareups in the lung -In terms of treating the rheumatoid arthritis alternatives to methotrexate with can be Imuran which you can discuss with your rheumatologist (Imuran can help her joints but also potentially the lung]  -Do not recommend CellCept at this point - Contiue sagwell exercise   Follow-up - Return in 6 months for a 30-minute visit -but after breathing test  -Symptom score and walking desaturation test at follow-up

## 2020-07-19 ENCOUNTER — Encounter: Payer: Self-pay | Admitting: Dermatology

## 2020-07-19 NOTE — Progress Notes (Signed)
   Follow-Up Visit   Subjective  Nicholas Webb is a 80 y.o. male who presents for the following: Skin Problem (Lesion on mid frontal scalp- "came back ").  Crust on back crown of scalp near site of old removal Location:  Duration:  Quality:  Associated Signs/Symptoms: Modifying Factors:  Severity:  Timing: Context:   Objective  Well appearing patient in no apparent distress; mood and affect are within normal limits. Objective  Crown of Scalp: Bulky 2 cm crust posterior to 7 mm white scar       Objective  Mid Forehead: Multiple small actinic keratoses face and scalp, none of which are currently bothersome to Nicholas Webb.    A focused examination was performed including Head and neck.. Relevant physical exam findings are noted in the Assessment and Plan.   Assessment & Plan    Neoplasm of uncertain behavior of skin Crown of Scalp  Skin / nail biopsy Type of biopsy: tangential   Informed consent: discussed and consent obtained   Timeout: patient name, date of birth, surgical site, and procedure verified   Procedure prep:  Patient was prepped and draped in usual sterile fashion (Non sterile) Prep type:  Chlorhexidine Anesthesia: the lesion was anesthetized in a standard fashion   Anesthetic:  1% lidocaine w/ epinephrine 1-100,000 local infiltration Instrument used: flexible razor blade   Hemostasis achieved with: ferric subsulfate   Outcome: patient tolerated procedure well   Post-procedure details: sterile dressing applied and wound care instructions given   Dressing type: bandage and petrolatum    Specimen 1 - Surgical pathology Differential Diagnosis: R/O KA  Check Margins: No  AK (actinic keratosis) Mid Forehead  Defer intervention.  May do field therapy and late fall or winter.      I, [name], MD, have reviewed all documentation for this visit.  The documentation on 07/19/20 for the exam, diagnosis, procedures, and orders are all  accurate and complete.

## 2020-07-22 ENCOUNTER — Telehealth: Payer: Self-pay

## 2020-07-22 DIAGNOSIS — I2510 Atherosclerotic heart disease of native coronary artery without angina pectoris: Secondary | ICD-10-CM | POA: Diagnosis not present

## 2020-07-22 DIAGNOSIS — M150 Primary generalized (osteo)arthritis: Secondary | ICD-10-CM | POA: Diagnosis not present

## 2020-07-22 DIAGNOSIS — M0589 Other rheumatoid arthritis with rheumatoid factor of multiple sites: Secondary | ICD-10-CM | POA: Diagnosis not present

## 2020-07-22 DIAGNOSIS — E559 Vitamin D deficiency, unspecified: Secondary | ICD-10-CM | POA: Diagnosis not present

## 2020-07-22 DIAGNOSIS — R0602 Shortness of breath: Secondary | ICD-10-CM | POA: Diagnosis not present

## 2020-07-22 DIAGNOSIS — M8580 Other specified disorders of bone density and structure, unspecified site: Secondary | ICD-10-CM | POA: Diagnosis not present

## 2020-07-22 DIAGNOSIS — Z79899 Other long term (current) drug therapy: Secondary | ICD-10-CM | POA: Diagnosis not present

## 2020-07-22 NOTE — Telephone Encounter (Signed)
Phone call to patient with his pathology results. Patient aware of results.  

## 2020-07-22 NOTE — Telephone Encounter (Signed)
-----   Message from [name], MD sent at 07/19/2020  6:28 AM EDT ----- Schedule Mohs

## 2020-07-28 NOTE — Progress Notes (Signed)
   Follow-Up Visit   Subjective  Nicholas Webb is a 80 y.o. male who presents for the following: Follow-up (Check lesion on top of scalp that was removed wants to make sure its healing ok. ).  C site crown of scalp has gotten worse, initial biopsy did not confirm skin cancer but now clinically typical Location:  Duration:  Quality:  Associated Signs/Symptoms: Modifying Factors:  Severity:  Timing: Context:   Objective  Well appearing patient in no apparent distress; mood and affect are within normal limits. Objective  crown of scalp: Now lesion has formed a solid volcano-like 2 cm nodule with central crust typical of an SCCA/KA.  Since initial biopsy was not confirmatory.  We will do a deeper shave biopsy.  Refer for Mohs if confirmatory.       A focused examination was performed including Scalp. Relevant physical exam findings are noted in the Assessment and Plan.   Assessment & Plan    Neoplasm of uncertain behavior of skin crown of scalp  Skin / nail biopsy Type of biopsy: tangential   Informed consent: discussed and consent obtained   Timeout: patient name, date of birth, surgical site, and procedure verified   Anesthesia: the lesion was anesthetized in a standard fashion   Anesthetic:  1% lidocaine w/ epinephrine 1-100,000 local infiltration Instrument used: flexible razor blade   Hemostasis achieved with: ferric subsulfate   Outcome: patient tolerated procedure well   Post-procedure details: wound care instructions given    Specimen 1 - Surgical pathology Differential Diagnosis: [id number]  Check Margins: No      I, [name], MD, have reviewed all documentation for this visit.  The documentation on 07/28/20 for the exam, diagnosis, procedures, and orders are all accurate and complete.

## 2020-07-30 ENCOUNTER — Ambulatory Visit: Payer: Medicare PPO | Admitting: Dermatology

## 2020-08-23 ENCOUNTER — Other Ambulatory Visit: Payer: Self-pay | Admitting: Oncology

## 2020-08-29 DIAGNOSIS — C4442 Squamous cell carcinoma of skin of scalp and neck: Secondary | ICD-10-CM | POA: Diagnosis not present

## 2020-09-09 DIAGNOSIS — M0589 Other rheumatoid arthritis with rheumatoid factor of multiple sites: Secondary | ICD-10-CM | POA: Diagnosis not present

## 2020-09-09 DIAGNOSIS — M150 Primary generalized (osteo)arthritis: Secondary | ICD-10-CM | POA: Diagnosis not present

## 2020-09-09 DIAGNOSIS — Z79899 Other long term (current) drug therapy: Secondary | ICD-10-CM | POA: Diagnosis not present

## 2020-09-09 DIAGNOSIS — M8580 Other specified disorders of bone density and structure, unspecified site: Secondary | ICD-10-CM | POA: Diagnosis not present

## 2020-09-09 DIAGNOSIS — E559 Vitamin D deficiency, unspecified: Secondary | ICD-10-CM | POA: Diagnosis not present

## 2020-09-09 DIAGNOSIS — R0602 Shortness of breath: Secondary | ICD-10-CM | POA: Diagnosis not present

## 2020-09-09 DIAGNOSIS — G4733 Obstructive sleep apnea (adult) (pediatric): Secondary | ICD-10-CM | POA: Diagnosis not present

## 2020-09-09 DIAGNOSIS — I2510 Atherosclerotic heart disease of native coronary artery without angina pectoris: Secondary | ICD-10-CM | POA: Diagnosis not present

## 2020-09-19 DIAGNOSIS — S0180XD Unspecified open wound of other part of head, subsequent encounter: Secondary | ICD-10-CM | POA: Diagnosis not present

## 2020-10-15 ENCOUNTER — Encounter: Payer: Self-pay | Admitting: Podiatry

## 2020-10-15 ENCOUNTER — Other Ambulatory Visit: Payer: Self-pay

## 2020-10-15 ENCOUNTER — Ambulatory Visit: Payer: Medicare PPO | Admitting: Podiatry

## 2020-10-15 DIAGNOSIS — D689 Coagulation defect, unspecified: Secondary | ICD-10-CM

## 2020-10-15 DIAGNOSIS — M79676 Pain in unspecified toe(s): Secondary | ICD-10-CM

## 2020-10-15 DIAGNOSIS — B351 Tinea unguium: Secondary | ICD-10-CM | POA: Diagnosis not present

## 2020-10-15 NOTE — Progress Notes (Signed)
This patient returns to my office for at risk foot care.  This patient requires this care by a professional since this patient will be at risk due to having coagulation defect.  Patient is taking eliquis.  This patient is unable to cut nails himself since the patient cannot reach his nails.These nails are painful walking and wearing shoes.  This patient presents for at risk foot care today.  General Appearance  Alert, conversant and in no acute stress.  Vascular  Dorsalis pedis and posterior tibial  pulses are palpable  bilaterally.  Capillary return is within normal limits  bilaterally. Temperature is within normal limits  bilaterally.  Neurologic  Senn-Weinstein monofilament wire test within normal limits  bilaterally. Muscle power within normal limits bilaterally.  Nails Thick disfigured discolored nails with subungual debris  from hallux to fifth toes bilaterally. No evidence of bacterial infection or drainage bilaterally.  Orthopedic  No limitations of motion  feet .  No crepitus or effusions noted.  No bony pathology or digital deformities noted.  DJD 1st MPJ right foot. Bony prominence medial aspect both rearfeet.  Skin  normotropic skin with no porokeratosis noted bilaterally.  No signs of infections or ulcers noted.     Onychomycosis  Pain in right toes  Pain in left toes  Consent was obtained for treatment procedures.   Mechanical debridement of nails 1-5  bilaterally performed with a nail nipper.  Filed with dremel without incident.    Return office visit    4 months                 Told patient to return for periodic foot care and evaluation due to potential at risk complications.   Anelly Samarin DPM  

## 2020-10-22 DIAGNOSIS — G4733 Obstructive sleep apnea (adult) (pediatric): Secondary | ICD-10-CM | POA: Diagnosis not present

## 2020-10-22 DIAGNOSIS — K2270 Barrett's esophagus without dysplasia: Secondary | ICD-10-CM | POA: Diagnosis not present

## 2020-10-22 DIAGNOSIS — I6523 Occlusion and stenosis of bilateral carotid arteries: Secondary | ICD-10-CM | POA: Diagnosis not present

## 2020-10-22 DIAGNOSIS — E785 Hyperlipidemia, unspecified: Secondary | ICD-10-CM | POA: Diagnosis not present

## 2020-10-22 DIAGNOSIS — Z5181 Encounter for therapeutic drug level monitoring: Secondary | ICD-10-CM | POA: Diagnosis not present

## 2020-10-22 DIAGNOSIS — I2510 Atherosclerotic heart disease of native coronary artery without angina pectoris: Secondary | ICD-10-CM | POA: Diagnosis not present

## 2020-10-22 DIAGNOSIS — I5032 Chronic diastolic (congestive) heart failure: Secondary | ICD-10-CM | POA: Diagnosis not present

## 2020-10-22 DIAGNOSIS — M069 Rheumatoid arthritis, unspecified: Secondary | ICD-10-CM | POA: Diagnosis not present

## 2020-10-22 DIAGNOSIS — Z0000 Encounter for general adult medical examination without abnormal findings: Secondary | ICD-10-CM | POA: Diagnosis not present

## 2020-11-21 DIAGNOSIS — Z48817 Encounter for surgical aftercare following surgery on the skin and subcutaneous tissue: Secondary | ICD-10-CM | POA: Diagnosis not present

## 2020-11-21 DIAGNOSIS — L308 Other specified dermatitis: Secondary | ICD-10-CM | POA: Diagnosis not present

## 2020-11-26 ENCOUNTER — Other Ambulatory Visit: Payer: Self-pay | Admitting: Cardiology

## 2020-11-26 DIAGNOSIS — I2510 Atherosclerotic heart disease of native coronary artery without angina pectoris: Secondary | ICD-10-CM

## 2020-12-03 ENCOUNTER — Telehealth: Payer: Self-pay | Admitting: Dermatology

## 2020-12-03 NOTE — Telephone Encounter (Signed)
Patient calling to schedule a follow up appointment on area he had MOH's on top of head in June. Appointment scheduled in December. He wants to know if he should continue OTC medication that was recommended by ST at last visit (I could not find documentation of OTC that was recommended) Please call patient back at his mobile number.

## 2020-12-04 NOTE — Telephone Encounter (Signed)
Phone call to patient to get the name of the medication he has questions about. Patient states that he's not home and he can't remember the name. Patient states that once he gets home he'll call back with the medication name.

## 2020-12-05 ENCOUNTER — Telehealth: Payer: Self-pay | Admitting: Dermatology

## 2020-12-05 NOTE — Telephone Encounter (Signed)
He's taking Nicotinamide 50 MG 2x/day. Should he still be taking this, or....? Says ST gave it to him. Chart # [id number], pink.

## 2020-12-09 DIAGNOSIS — G4733 Obstructive sleep apnea (adult) (pediatric): Secondary | ICD-10-CM | POA: Diagnosis not present

## 2020-12-09 NOTE — Telephone Encounter (Signed)
Patient aware.

## 2020-12-11 DIAGNOSIS — M0589 Other rheumatoid arthritis with rheumatoid factor of multiple sites: Secondary | ICD-10-CM | POA: Diagnosis not present

## 2020-12-11 DIAGNOSIS — I2510 Atherosclerotic heart disease of native coronary artery without angina pectoris: Secondary | ICD-10-CM | POA: Diagnosis not present

## 2020-12-11 DIAGNOSIS — Z79899 Other long term (current) drug therapy: Secondary | ICD-10-CM | POA: Diagnosis not present

## 2020-12-11 DIAGNOSIS — R0602 Shortness of breath: Secondary | ICD-10-CM | POA: Diagnosis not present

## 2020-12-11 DIAGNOSIS — E559 Vitamin D deficiency, unspecified: Secondary | ICD-10-CM | POA: Diagnosis not present

## 2020-12-11 DIAGNOSIS — M150 Primary generalized (osteo)arthritis: Secondary | ICD-10-CM | POA: Diagnosis not present

## 2020-12-11 DIAGNOSIS — M8580 Other specified disorders of bone density and structure, unspecified site: Secondary | ICD-10-CM | POA: Diagnosis not present

## 2020-12-30 ENCOUNTER — Other Ambulatory Visit: Payer: Self-pay

## 2020-12-30 ENCOUNTER — Inpatient Hospital Stay: Payer: Medicare PPO | Attending: Oncology | Admitting: Oncology

## 2020-12-30 VITALS — BP 129/71 | HR 83 | Temp 98.7°F | Resp 18 | Ht 68.0 in | Wt 199.0 lb

## 2020-12-30 DIAGNOSIS — I2699 Other pulmonary embolism without acute cor pulmonale: Secondary | ICD-10-CM | POA: Insufficient documentation

## 2020-12-30 DIAGNOSIS — M069 Rheumatoid arthritis, unspecified: Secondary | ICD-10-CM | POA: Insufficient documentation

## 2020-12-30 DIAGNOSIS — Z86718 Personal history of other venous thrombosis and embolism: Secondary | ICD-10-CM | POA: Insufficient documentation

## 2020-12-30 DIAGNOSIS — I2510 Atherosclerotic heart disease of native coronary artery without angina pectoris: Secondary | ICD-10-CM | POA: Diagnosis not present

## 2020-12-30 DIAGNOSIS — Z8719 Personal history of other diseases of the digestive system: Secondary | ICD-10-CM | POA: Diagnosis not present

## 2020-12-30 DIAGNOSIS — J8410 Pulmonary fibrosis, unspecified: Secondary | ICD-10-CM | POA: Insufficient documentation

## 2020-12-30 NOTE — Progress Notes (Signed)
[place] OFFICE PROGRESS NOTE   Diagnosis: DVT/pulmonary embolism  INTERVAL HISTORY:   [name] returns as scheduled.  He feels well.  No bleeding or symptom of thrombosis.  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  He is followed by pulmonary medicine for management of pulmonary fibrosis.  Objective:  Vital signs in last 24 hours:  Blood pressure 129/71, pulse 83, temperature 98.7 F (37.1 C), temperature source Oral, resp. rate 18, height 5\' 8"  (1.727 m), weight 199 lb (90.3 kg), SpO2 98 %.    HEENT: Neck without mass, surgical defect at the parietal scalp without evidence of recurrent tumor, no scalp mass Lymphatics: No cervical, supraclavicular, axillary, or inguinal nodes Resp: Lungs with inspiratory rales throughout the lower bilateral lung field Cardio: Regular rate and rhythm GI: No hepatosplenomegaly Vascular: No leg edema   Lab Results:  Lab Results  Component Value Date   WBC 6.8 10/07/2018   HGB 12.8 (L) 10/07/2018   HCT 40.2 10/07/2018   MCV 104.7 (H) 10/07/2018   PLT 146 (L) 10/07/2018   NEUTROABS 5.7 06/23/2018    CMP  Lab Results  Component Value Date   NA 140 10/07/2018   K 4.1 10/07/2018   CL 104 10/07/2018   CO2 27 10/07/2018   GLUCOSE 99 10/07/2018   BUN 21 10/07/2018   CREATININE 0.90 06/15/2019   CALCIUM 9.2 10/07/2018   PROT 6.4 (L) 06/24/2018   ALBUMIN 3.2 (L) 06/24/2018   AST 40 06/24/2018   ALT 49 (H) 06/24/2018   ALKPHOS 67 06/24/2018   BILITOT 0.8 06/24/2018   GFRNONAA >60 10/07/2018   GFRAA >60 10/07/2018    Lab Results  Component Value Date   CEA1 1.4 06/27/2018    Medications: I have reviewed the patient's current medications.   Assessment/Plan: Skull mass, chest lymphadenopathy MRI brain 06/24/2018-posterior vertex infiltrated and poorly marginated mass with extending into the underlying pachymeningeal's and into the scalp, the mass invades and  narrows the superior sagittal sinus CTs of the chest, abdomen, and pelvis on 06/25/2018-acute bilateral pulmonary emboli with a large clot burden, necrotic mediastinal adenopathy, 8 mm pleural-based left lower lobe nodule, 4 mm left upper lobe nodules, chronic pulmonary fibrosis PET scan 07/06/2018- no evidence of a primary malignancy, small pulmonary nodules are not metabolically active, mildly enlarged/mildly metabolic mediastinal nodes favored to be reactive MRI brain 10/10/2018-progression of calvarial lesion at the vertex, stable 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  Pulmonary fibrosis.  Multiple nonspecific pulmonary nodules 2.  NSTEMI-cardiac catheterization 06/24/2018 with 99% LAD stenosis, status post placement of a drug-eluting stent 3.  Bilateral pulmonary emboli on CT 06/25/2018- treated with heparin, now maintained on apixaban Doppler 06/27/2018- acute left femoral vein thrombosis   4.  History of gastroesophageal reflux disease and Barrett's esophagus 5.  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  Rheumatoid arthritis, treated with Humira and methotrexate, currently treated with methotrexate only 7.  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  Pulmonary fibrosis noted on CT 06/25/2018        Disposition: [name] appears stable.  He continues indefinite anticoagulation therapy after being diagnosed with a deep vein thrombosis and pulmonary emboli in April 2020.  There  is no clinical evidence for development of a malignancy.  He continues treatment for rheumatoid arthritis.  He is now followed by pulmonary medicine for pulmonary fibrosis.  He can follow-up  with pulmonary medicine for the nonspecific pulmonary nodules.  [name] will return for an office visit in 1 year.  [name], MD  12/30/2020  9:48 AM

## 2021-01-16 ENCOUNTER — Ambulatory Visit (INDEPENDENT_AMBULATORY_CARE_PROVIDER_SITE_OTHER): Payer: Medicare PPO | Admitting: Internal Medicine

## 2021-01-16 ENCOUNTER — Ambulatory Visit: Payer: Medicare PPO | Admitting: Internal Medicine

## 2021-01-16 ENCOUNTER — Encounter: Payer: Self-pay | Admitting: Internal Medicine

## 2021-01-16 ENCOUNTER — Other Ambulatory Visit: Payer: Self-pay

## 2021-01-16 VITALS — BP 116/70 | HR 65 | Ht 68.0 in | Wt 201.8 lb

## 2021-01-16 DIAGNOSIS — R918 Other nonspecific abnormal finding of lung field: Secondary | ICD-10-CM

## 2021-01-16 DIAGNOSIS — Z8739 Personal history of other diseases of the musculoskeletal system and connective tissue: Secondary | ICD-10-CM | POA: Diagnosis not present

## 2021-01-16 DIAGNOSIS — J849 Interstitial pulmonary disease, unspecified: Secondary | ICD-10-CM

## 2021-01-16 DIAGNOSIS — M359 Systemic involvement of connective tissue, unspecified: Secondary | ICD-10-CM

## 2021-01-16 DIAGNOSIS — J8489 Other specified interstitial pulmonary diseases: Secondary | ICD-10-CM | POA: Diagnosis not present

## 2021-01-16 LAB — PULMONARY FUNCTION TEST
DL/VA % pred: 78 %
DL/VA: 3.09 ml/min/mmHg/L
DLCO cor % pred: 65 %
DLCO cor: 15.03 ml/min/mmHg
DLCO unc % pred: 65 %
DLCO unc: 15.03 ml/min/mmHg
FEF 25-75 Pre: 2.95 L/sec
FEF2575-%Pred-Pre: 160 %
FEV1-%Pred-Pre: 108 %
FEV1-Pre: 2.88 L
FEV1FVC-%Pred-Pre: 112 %
FEV6-%Pred-Pre: 101 %
FEV6-Pre: 3.53 L
FEV6FVC-%Pred-Pre: 107 %
FVC-%Pred-Pre: 95 %
FVC-Pre: 3.56 L
Pre FEV1/FVC ratio: 81 %
Pre FEV6/FVC Ratio: 100 %

## 2021-01-16 NOTE — Progress Notes (Signed)
OV 07/17/2020  Subjective:  Patient ID: Nicholas Webb, male , DOB: Jul 21, 1940 , age 80 y.o. , MRN: [id number] , ADDRESS: [address] [place] [address] PCP Maurice Small, MD Patient Care Team: Maurice Small, MD as PCP - General (Family Medicine) Jerline Pain, MD as PCP - Cardiology (Cardiology) Warren Danes, PA-C as Physician Assistant (Dermatology) [name], MD as Consulting Physician (Dermatology)  This Provider for this visit: Treatment Team:  Attending Provider: Brand Males, MD    07/17/2020 -   Chief Complaint  Patient presents with   Follow-up    PFT performed 06/17/20. Pt states he is about the same since last visit.   Rheumatoid arthritis with interstitial lung disease History of pulmonary embolism on Eliquis - since April 2020 History of coronary artery disease follows Dr. Marlou Porch  HPI Nicholas Webb 80 y.o. -presents for follow-up.  He is accompanied by his daughter.  This visit is to focus on discussing the results.  His symptoms are extremely mild from a respiratory standpoint documented below.  His high-resolution CT chest shows interstitial lung disease probable UIP pattern.  He also has multiple small pulmonary nodules.  There is coronary artery calcification [he is known to have coronary artery disease and follows Dr. Marlou Porch.  His pulmonary function test shows very mild restriction from his ILD.  His walking desaturation test is essentially close to normal except for tachycardia.  Taken together the burden of ILD is mild.  His daughter wants to know how to approach his ILD.  They report that he was on methotrexate for many years [greater than 10].  There was wheezing and that is why he was referred to [place].  He is currently off methotrexate.  He started working out at [employer] well     HRCT Chest data march 2022 CLINICAL DATA:  Interstitial lung disease, history of rheumatoid arthritis   EXAM: CT CHEST WITHOUT CONTRAST    TECHNIQUE: Multidetector CT imaging of the chest was performed following the standard protocol without intravenous contrast. High resolution imaging of the lungs, as well as inspiratory and expiratory imaging, was performed.   COMPARISON:  None.   FINDINGS: Cardiovascular: Aortic atherosclerosis. Normal heart size. Left coronary artery calcifications and stents. No pericardial effusion.   Mediastinum/Nodes: No enlarged mediastinal, hilar, or axillary lymph nodes. Thyroid gland, trachea, and esophagus demonstrate no significant findings.   Lungs/Pleura: There is moderate pulmonary fibrosis in a pattern with apical to basal gradient featuring irregular peripheral interstitial opacity, septal thickening, mild traction bronchiectasis, areas of subpleural bronchiolectasis without clear evidence of honeycombing. No significant air trapping on expiratory phase imaging. There multiple small pulmonary nodules in the bilateral lung apices, generally irregular in morphology and measuring up to 5 mm, for example in the left apex (series 10, image 58). No pleural effusion or pneumothorax.   Upper Abdomen: No acute abnormality.   Musculoskeletal: No chest wall mass or suspicious bone lesions identified.  IMPRESSION: 1. There is moderate pulmonary fibrosis in a pattern with apical to basal gradient featuring irregular peripheral interstitial opacity, septal thickening, mild traction bronchiectasis, and areas of subpleural bronchiolectasis without clear evidence of honeycombing. Findings are consistent with a "probable UIP" pattern and generally in keeping with rheumatoid associated interstitial lung disease. Findings are categorized as probable UIP per consensus guidelines: Diagnosis of Idiopathic Pulmonary Fibrosis: An Official ATS/ERS/JRS/ALAT Clinical Practice Guideline. [place], Iss 5, [phone], Oct 31 2016. 2. There multiple small  nonspecific pulmonary  nodules in the bilateral lung apices, generally irregular in morphology and measuring 5 mm and smaller. No follow-up needed if patient is low-risk (and has no known or suspected primary neoplasm). Non-contrast chest CT can be considered in 12 months if patient is high-risk. This recommendation follows the consensus statement: Guidelines for Management of Incidental Pulmonary Nodules Detected on CT Images: From the Fleischner Society 2017; Radiology 2017; 284:228-243. 3. Coronary artery disease.   Aortic Atherosclerosis (ICD10-I70.0).     Electronically Signed   By: Eddie Candle M.D.   On: 05/22/2020 11:58      OV 01/16/2021  Subjective:  Patient ID: Nicholas Webb, male , DOB: 29-Nov-1940 , age 80 y.o. , MRN: [id number] , ADDRESS: [address] [place] [address] PCP Maurice Small, MD (Inactive) Patient Care Team: Maurice Small, MD (Inactive) as PCP - General (Family Medicine) Jerline Pain, MD as PCP - Cardiology (Cardiology) Starlyn Skeans as Physician Assistant (Dermatology) [name], MD as Consulting Physician (Dermatology)  This Provider for this visit: Treatment Team:  Attending Provider: Brand Males, MD   Rheumatoid arthritis with interstitial lung disease  -Started on Imuran by rheumatology between May 2022 and November 2022  -Last HRCT March 2022 History of pulmonary embolism on Eliquis - since April 2020 History of coronary artery disease follows Dr. Jory Sims pulmonary nodules associated with RA ILD  01/16/2021 -   Chief Complaint  Patient presents with   Follow-up    No new concerns, pft done today     HPI Nicholas Webb 80 y.o. -presents for routine follow-up.  He feels stable.  His dyspnea score is all altered.  It is documented below.  His walking desaturation test simple is also unchanged.  He feels great.  In the interim his rheumatologist started him on Imuran for his rheumatoid arthritis pain.  He feels good overall.   However his pulmonary function test shows 3-4% decline in FVC and a 16% decline in DLCO.  He is not feeling this.  We discussed about adding antifibrotic to his Imuran with the understanding that the Imuran itself might be having a antifibrotic effect.  He does not want to start this right now.     SYMPTOM SCALE - ILD 07/17/2020   O2 use ra  Shortness of Breath 0 -> 5 scale with 5 being worst (score 6 If unable to do)  At rest 0  Simple tasks - showers, clothes change, eating, shaving 0  Household (dishes, doing bed, laundry) 1  Shopping 0  Walking level at own pace 0  Walking up Stairs 1  Total (30-36) Dyspnea Score 2  How bad is your cough? minimal  How bad is your fatigue 0  How bad is nausea 0  How bad is vomiting?  0  How bad is diarrhea? 0  How bad is anxiety? 00  How bad is depression 0       Simple office walk 185 feet x  3 laps goal with forehead probe 07/17/2020  01/16/2021   O2 used ra ra  Number laps completed 3 3  Comments about pace avg avg  Resting Pulse Ox/HR 98% and 74/min 97% and 71/min  Final Pulse Ox/HR 96% and 108/min 96% and HR 106  Desaturated </= 88% no no  Desaturated <= 3% points no no  Got Tachycardic >/= 90/min yes yes  Symptoms at end of test none none  Miscellaneous comments x No change    CT Chest  data  No results found.    PFT  PFT Results Latest Ref Rng & Units 01/16/2021 06/17/2020  FVC-Pre L 3.56 3.71  FVC-Predicted Pre % 95 98  FVC-Post L - 3.75  FVC-Predicted Post % - 99  Pre FEV1/FVC % % 81 81  Post FEV1/FCV % % - 84  FEV1-Pre L 2.88 3.01  FEV1-Predicted Pre % 108 111  FEV1-Post L - 3.14  DLCO uncorrected ml/min/mmHg 15.03 18.32  DLCO UNC% % 65 78  DLCO corrected ml/min/mmHg 15.03 18.32  DLCO COR %Predicted % 65 78  DLVA Predicted % 78 96  TLC L - 5.30  TLC % Predicted % - 79  RV % Predicted % - 67       has a past medical history of Anemia of chronic disease, Atherosclerosis of both carotid arteries,  Atypical mole (03/28/2013), Barrett's esophagus, Basal cell carcinoma (12/17/2015), Dizziness, GERD (gastroesophageal reflux disease), adenomatous colonic polyps, Hyperlipidemia, Macrocytosis without anemia, Obesity, OSA (obstructive sleep apnea), Rheumatoid arthritis(714.0), Squamous cell carcinoma of skin (02/25/2010), Squamous cell carcinoma of skin (02/25/2010), Squamous cell carcinoma of skin (03/24/2011), Squamous cell carcinoma of skin (03/24/2011), Squamous cell carcinoma of skin (03/24/2011), Squamous cell carcinoma of skin (05/05/2011), Squamous cell carcinoma of skin (11/28/2013), Squamous cell carcinoma of skin (12/17/2015), Squamous cell carcinoma of skin (12/17/2015), Squamous cell carcinoma of skin (01/03/2016), Squamous cell carcinoma of skin (04/22/2016), Squamous cell carcinoma of skin (10/06/2016), Squamous cell carcinoma of skin (12/22/2016), Squamous cell carcinoma of skin (01/11/2018), Squamous cell carcinoma of skin (01/11/2018), Squamous cell carcinoma of skin (01/11/2018), Squamous cell carcinoma of skin (01/11/2018), Squamous cell carcinoma of skin (03/07/2019), Squamous cell carcinoma of skin (03/07/2019), Squamous cell carcinoma of skin (03/07/2019), Squamous cell carcinoma of skin (01/04/2020), Squamous cell carcinoma of skin (01/04/2020), Squamous cell carcinoma of skin (01/04/2020), and Syncope.   reports that he has never smoked. He has never used smokeless tobacco.  Past Surgical History:  Procedure Laterality Date   CORONARY STENT INTERVENTION N/A 06/24/2018   Procedure: CORONARY STENT INTERVENTION;  Surgeon: Burnell Blanks, MD;  Location: [facility];  Service: Cardiovascular;  Laterality: N/A;   IR IVC FILTER PLMT / S&I /IMG GUID/MOD SED  10/07/2018   IR IVC FILTER RETRIEVAL / S&I /IMG GUID/MOD SED  06/15/2019   IR RADIOLOGIST EVAL & MGMT  09/27/2018   IR RADIOLOGIST EVAL & MGMT  01/04/2019   JOINT REPLACEMENT  2004   left   LEFT HEART CATH AND CORONARY  ANGIOGRAPHY N/A 06/24/2018   Procedure: LEFT HEART CATH AND CORONARY ANGIOGRAPHY;  Surgeon: Burnell Blanks, MD;  Location: [facility];  Service: Cardiovascular;  Laterality: N/A;    No Known Allergies  Immunization History  Administered Date(s) Administered   Influenza, High Dose Seasonal PF 12/18/2013, 12/18/2014, 12/13/2017   Influenza, Quadrivalent, Recombinant, Inj, Pf 12/14/2016, 12/07/2018   Influenza-Unspecified 12/01/2012, 11/30/2020   PFIZER(Purple Top)SARS-COV-2 Vaccination 03/23/2019, 04/13/2019, 11/30/2020   Tdap 06/23/2018   Zoster Recombinat (Shingrix) 05/08/2017, 07/09/2017    Family History  Problem Relation Age of Onset   Stroke Mother    Glaucoma Mother    Cancer Father    Prostate cancer Father    Colon cancer Father      Current Outpatient Medications:    atorvastatin (LIPITOR) 40 MG tablet, TAKE 1 TABLET(40 MG) BY MOUTH DAILY, Disp: 90 tablet, Rfl: 2   azaTHIOprine (IMURAN) 50 MG tablet, Take 50 mg by mouth 2 (two) times daily., Disp: , Rfl:    ELIQUIS 5  MG TABS tablet, TAKE 1 TABLET(5 MG) BY MOUTH TWICE DAILY, Disp: 60 tablet, Rfl: 5   esomeprazole (NEXIUM) 20 MG capsule, Take 20 mg by mouth daily at 12 noon. , Disp: , Rfl:    fluocinonide ointment (LIDEX) 0.05 %, Apply topically as needed., Disp: , Rfl:    Multiple Vitamins-Minerals (MULTIVITAMIN WITH MINERALS) tablet, Take 1 tablet by mouth daily., Disp: , Rfl:    Niacinamide-Zn-Cu-Methfo-Se-Cr (NICOTINAMIDE PO), Take 500 mg by mouth., Disp: , Rfl:       Objective:   Vitals:   01/16/21 1056  BP: 116/70  Pulse: 65  SpO2: 96%  Weight: 201 lb 12.8 oz (91.5 kg)  Height: 5\' 8"  (1.727 m)    Estimated body mass index is 30.68 kg/m as calculated from the following:   Height as of this encounter: 5\' 8"  (1.727 m).   Weight as of this encounter: 201 lb 12.8 oz (91.5 kg).  @WEIGHTCHANGE @  Filed Weights   01/16/21 1056  Weight: 201 lb 12.8 oz (91.5 kg)     Physical  Exam    General: No distress. Looks well Neuro: Alert and Oriented x 3. GCS 15. Speech normal Psych: Pleasant Resp:  Barrel Chest - no.  Wheeze - no, Crackles - yes at base, No overt respiratory distress CVS: Normal heart sounds. Murmurs - no Ext: Stigmata of Connective Tissue Disease - no HEENT: Normal upper airway. PEERL +. No post nasal drip        Assessment:       ICD-10-CM   1. Interstitial lung disease due to connective tissue disease ([place])  J84.89    M35.9     2. ILD (interstitial lung disease) ([facility])  J84.9 Pulmonary function test    3. History of rheumatoid arthritis  Z87.39     4. Multiple lung nodules on CT  R91.8          Plan:     Patient Instructions     ICD-10-CM   1. Interstitial lung disease due to connective tissue disease ([place])  J84.89    M35.9   2. ILD (interstitial lung disease) ([place])  J84.9 Pulmonary function test    Pulse oximetry, overnight    CT CHEST HIGH RESOLUTION  3. History of rheumatoid arthritis  Z87.39   4. 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   - is currently mild based on breathing test and walk test and symptoms  - There appears to be mild progerssion on pft but not reflected on symptoms or walk test  Multiple small lung nodules probably due to rheumatoid arthritis (March 2022 last CT)]  Plan -Current literature supports antifibrotic therapy [either through direct antifibrotic nintedanib +/-  Immunomodulator such as Imuran or CellCept) only in the event of progression - Immuran recently started by Dr Dossie Der might help stabilize your lungs   - so continue immuran - Hold off on starting anti-fibrotic ofev/esbriet  - based on shared decision making - Take ILD-PRo registry consent  - Pulmon[facility] study team will ccontact you -Do spirometry and DLCO in 10 weeeks - Contiue sagwell exercise  - Decide on repeat HRCT timing at next visit - will arrange for Spring  2022  Follow-up - Return in 10 weeks -but after breathing test  -Symptom score and walking desaturation test at follow-up  - [place] for visitor to come with you to exam room    SIGNATURE    Dr. Brand Males, M.D., F.C.C.P,  Pulmonary and Critical Care Medicine Staff Physician, [place] Director - Interstitial Lung Disease  Program  Pulmonary [facility] [place], [place], [address]  Pager: [phone], If no answer or between  15:00h - 7:00h: call [phone] Telephone: [phone]  11:57 AM 01/16/2021

## 2021-01-16 NOTE — Patient Instructions (Addendum)
ICD-10-CM   1. Interstitial lung disease due to connective tissue disease ([place])  J84.89    M35.9   2. ILD (interstitial lung disease) ([place])  J84.9 Pulmonary function test    Pulse oximetry, overnight    CT CHEST HIGH RESOLUTION  3. History of rheumatoid arthritis  Z87.39   4. 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   - is currently mild based on breathing test and walk test and symptoms  - There appears to be mild progerssion on pft but not reflected on symptoms or walk test  Multiple small lung nodules probably due to rheumatoid arthritis (March 2022 last CT)]  Plan -Current literature supports antifibrotic therapy [either through direct antifibrotic nintedanib +/-  Immunomodulator such as Imuran or CellCept) only in the event of progression - Immuran recently started by Dr Dossie Der might help stabilize your lungs   - so continue immuran - Hold off on starting anti-fibrotic ofev/esbriet  - based on shared decision making - Take ILD-PRo registry consent  - Pulmon[facility]x study team will ccontact you -Do spirometry and DLCO in 10 weeeks - Contiue sagwell exercise  - Decide on repeat HRCT timing at next visit - will arrange for Spring 2022  Follow-up - Return in 10 weeks -but after breathing test  -Symptom score and walking desaturation test at follow-up  - [place] for visitor to come with you to exam room

## 2021-01-16 NOTE — Progress Notes (Signed)
Spiro/Dlco done today. 

## 2021-01-19 ENCOUNTER — Other Ambulatory Visit: Payer: Self-pay | Admitting: Oncology

## 2021-01-21 DIAGNOSIS — G4733 Obstructive sleep apnea (adult) (pediatric): Secondary | ICD-10-CM | POA: Diagnosis not present

## 2021-01-30 ENCOUNTER — Ambulatory Visit: Payer: Medicare PPO | Admitting: Dermatology

## 2021-01-30 ENCOUNTER — Other Ambulatory Visit: Payer: Self-pay

## 2021-01-30 DIAGNOSIS — L570 Actinic keratosis: Secondary | ICD-10-CM

## 2021-01-30 DIAGNOSIS — C44329 Squamous cell carcinoma of skin of other parts of face: Secondary | ICD-10-CM

## 2021-01-30 DIAGNOSIS — D485 Neoplasm of uncertain behavior of skin: Secondary | ICD-10-CM

## 2021-01-30 NOTE — Patient Instructions (Signed)

## 2021-02-06 ENCOUNTER — Telehealth: Payer: Self-pay

## 2021-02-06 ENCOUNTER — Telehealth: Payer: Self-pay | Admitting: *Deleted

## 2021-02-06 NOTE — Telephone Encounter (Signed)
Pathology to patient- lesion treated after biopsy- no surgery appointment needed dr tafeen.

## 2021-02-06 NOTE — Telephone Encounter (Signed)
-----   Message from [name], MD sent at 02/06/2021  6:03 AM EST ----- Schedule surgery with Dr. Darene Lamer

## 2021-02-06 NOTE — Telephone Encounter (Signed)
Patient has mychart, message was left for the patient to call if he has any questions tx with bx

## 2021-02-11 ENCOUNTER — Ambulatory Visit: Payer: Medicare PPO | Admitting: Podiatry

## 2021-02-19 NOTE — Progress Notes (Signed)
° °  Follow-Up Visit   Subjective  Nicholas Webb is a 80 y.o. male who presents for the following: Follow-up (Follow up on scalp. Patient had MOHS surgery. /).  Check Mohs surgery site multiple facial crusts. Location:  Duration:  Quality:  Associated Signs/Symptoms: Modifying Factors:  Severity:  Timing: Context:   Objective  Well appearing patient in no apparent distress; mood and affect are within normal limits. Left Parotid Area, Left Temple, Right Parotid Area, Right Temple Multiple hornlike 2 to 4 mm pink crusts  Right Preauricular Area Ill-defined 6 cm pink crust, probable superficial SCCA.         A focused examination was performed including head and neck.. Relevant physical exam findings are noted in the Assessment and Plan.   Assessment & Plan    AK (actinic keratosis) (4) Left Temple; Right Temple; Left Parotid Area; Right Parotid Area  Destruction of lesion - Left Parotid Area, Left Temple, Right Parotid Area, Right Temple Complexity: simple   Destruction method: cryotherapy   Informed consent: discussed and consent obtained   Timeout:  patient name, date of birth, surgical site, and procedure verified Lesion destroyed using liquid nitrogen: Yes   Cryotherapy cycles:  3 Outcome: patient tolerated procedure well with no complications   Post-procedure details: wound care instructions given    Squamous cell carcinoma of skin of right cheek Right Preauricular Area  Skin / nail biopsy Type of biopsy: tangential   Informed consent: discussed and consent obtained   Timeout: patient name, date of birth, surgical site, and procedure verified   Anesthesia: the lesion was anesthetized in a standard fashion   Anesthetic:  1% lidocaine w/ epinephrine 1-100,000 local infiltration Instrument used: flexible razor blade   Hemostasis achieved with: ferric subsulfate   Outcome: patient tolerated procedure well   Post-procedure details: wound care instructions  given    Destruction of lesion Complexity: simple   Destruction method: electrodesiccation and curettage   Informed consent: discussed and consent obtained   Timeout:  patient name, date of birth, surgical site, and procedure verified Anesthesia: the lesion was anesthetized in a standard fashion   Anesthetic:  1% lidocaine w/ epinephrine 1-100,000 local infiltration Curettage performed in three different directions: Yes   Curettage cycles:  3 Lesion length (cm):  1.8 Lesion width (cm):  1.8 Margin per side (cm):  0 Final wound size (cm):  1.8 Hemostasis achieved with:  aluminum chloride Outcome: patient tolerated procedure well with no complications   Post-procedure details: wound care instructions given    Specimen 1 - Surgical pathology Differential Diagnosis: scc vs bcc txpbx  Check Margins: No  After shave biopsy the base of the lesion was treated with curettage plus cautery.      I, [name], MD, have reviewed all documentation for this visit.  The documentation on 02/19/21 for the exam, diagnosis, procedures, and orders are all accurate and complete.

## 2021-02-28 ENCOUNTER — Encounter: Payer: Self-pay | Admitting: Podiatry

## 2021-02-28 ENCOUNTER — Ambulatory Visit: Payer: Medicare PPO | Admitting: Podiatry

## 2021-02-28 ENCOUNTER — Other Ambulatory Visit: Payer: Self-pay

## 2021-02-28 DIAGNOSIS — M79676 Pain in unspecified toe(s): Secondary | ICD-10-CM | POA: Diagnosis not present

## 2021-02-28 DIAGNOSIS — D689 Coagulation defect, unspecified: Secondary | ICD-10-CM

## 2021-02-28 DIAGNOSIS — B351 Tinea unguium: Secondary | ICD-10-CM

## 2021-02-28 NOTE — Progress Notes (Signed)
This patient returns to my office for at risk foot care.  This patient requires this care by a professional since this patient will be at risk due to having coagulation defect.  Patient is taking eliquis.  This patient is unable to cut nails himself since the patient cannot reach his nails.These nails are painful walking and wearing shoes.  This patient presents for at risk foot care today.  General Appearance  Alert, conversant and in no acute stress.  Vascular  Dorsalis pedis and posterior tibial  pulses are palpable  bilaterally.  Capillary return is within normal limits  bilaterally. Temperature is within normal limits  bilaterally.  Neurologic  Senn-Weinstein monofilament wire test within normal limits  bilaterally. Muscle power within normal limits bilaterally.  Nails Thick disfigured discolored nails with subungual debris  from hallux to fifth toes bilaterally. No evidence of bacterial infection or drainage bilaterally.  Orthopedic  No limitations of motion  feet .  No crepitus or effusions noted.  No bony pathology or digital deformities noted.  DJD 1st MPJ right foot. Bony prominence medial aspect both rearfeet.  Skin  normotropic skin with no porokeratosis noted bilaterally.  No signs of infections or ulcers noted.     Onychomycosis  Pain in right toes  Pain in left toes  Consent was obtained for treatment procedures.   Mechanical debridement of nails 1-5  bilaterally performed with a nail nipper.  Filed with dremel without incident.    Return office visit    4 months                 Told patient to return for periodic foot care and evaluation due to potential at risk complications.   Carole Doner DPM  

## 2021-03-13 DIAGNOSIS — E559 Vitamin D deficiency, unspecified: Secondary | ICD-10-CM | POA: Diagnosis not present

## 2021-03-13 DIAGNOSIS — R0602 Shortness of breath: Secondary | ICD-10-CM | POA: Diagnosis not present

## 2021-03-13 DIAGNOSIS — I2510 Atherosclerotic heart disease of native coronary artery without angina pectoris: Secondary | ICD-10-CM | POA: Diagnosis not present

## 2021-03-13 DIAGNOSIS — M8580 Other specified disorders of bone density and structure, unspecified site: Secondary | ICD-10-CM | POA: Diagnosis not present

## 2021-03-13 DIAGNOSIS — Z79899 Other long term (current) drug therapy: Secondary | ICD-10-CM | POA: Diagnosis not present

## 2021-03-13 DIAGNOSIS — M0589 Other rheumatoid arthritis with rheumatoid factor of multiple sites: Secondary | ICD-10-CM | POA: Diagnosis not present

## 2021-03-13 DIAGNOSIS — M150 Primary generalized (osteo)arthritis: Secondary | ICD-10-CM | POA: Diagnosis not present

## 2021-03-27 ENCOUNTER — Encounter: Payer: Self-pay | Admitting: Internal Medicine

## 2021-03-27 ENCOUNTER — Ambulatory Visit: Payer: Medicare PPO | Admitting: Internal Medicine

## 2021-03-27 ENCOUNTER — Ambulatory Visit (INDEPENDENT_AMBULATORY_CARE_PROVIDER_SITE_OTHER): Payer: Medicare PPO | Admitting: Internal Medicine

## 2021-03-27 ENCOUNTER — Other Ambulatory Visit: Payer: Self-pay

## 2021-03-27 VITALS — BP 118/68 | HR 59 | Temp 98.5°F | Ht 68.0 in | Wt 196.0 lb

## 2021-03-27 DIAGNOSIS — J849 Interstitial pulmonary disease, unspecified: Secondary | ICD-10-CM | POA: Diagnosis not present

## 2021-03-27 DIAGNOSIS — R918 Other nonspecific abnormal finding of lung field: Secondary | ICD-10-CM

## 2021-03-27 LAB — PULMONARY FUNCTION TEST
DL/VA % pred: 80 %
DL/VA: 3.18 ml/min/mmHg/L
DLCO cor % pred: 69 %
DLCO cor: 15.93 ml/min/mmHg
DLCO unc % pred: 69 %
DLCO unc: 15.93 ml/min/mmHg
FEF 25-75 Pre: 3.03 L/sec
FEF2575-%Pred-Pre: 164 %
FEV1-%Pred-Pre: 110 %
FEV1-Pre: 2.95 L
FEV1FVC-%Pred-Pre: 113 %
FEV6-%Pred-Pre: 103 %
FEV6-Pre: 3.61 L
FEV6FVC-%Pred-Pre: 107 %
FVC-%Pred-Pre: 96 %
FVC-Pre: 3.61 L
Pre FEV1/FVC ratio: 82 %
Pre FEV6/FVC Ratio: 100 %

## 2021-03-27 NOTE — Addendum Note (Signed)
Addended by: Lorretta Harp on: 03/27/2021 11:41 AM   Modules accepted: Orders

## 2021-03-27 NOTE — Progress Notes (Signed)
Spirometry/DLCO performed today. 

## 2021-03-27 NOTE — Patient Instructions (Signed)
Full PFT performed today. °

## 2021-03-27 NOTE — Patient Instructions (Addendum)
ICD-10-CM   1. Interstitial lung disease due to connective tissue disease ([place])  J84.89    M35.9   2. ILD (interstitial lung disease) ([place])  J84.9 Pulmonary function test    Pulse oximetry, overnight    CT CHEST HIGH RESOLUTION  3. History of rheumatoid arthritis  Z87.39   4. 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   - is currently mild based on breathing test and walk test and symptoms  - There appears to be mild progerssion on pft between April 2022 and Nov 2022 -> stable since then  - I think immuran is helping control the ILD  Multiple small lung nodules probably due to rheumatoid arthritis (March 2022 last CT)]  Plan -\continue immuran via Dr Dossie Der - Hold off on starting anti-fibrotic ofev/esbriet  - based on shared decision making -  [facility] study team will ccontact you about ILD PRO registry in the next week -Do spirometry and DLCO in 6 months - restart sagwell exercise  - Do HRCT timing in 6 months for nodules (last march 2022)  Follow-up - Return in 6 months -but after breathing test and CT scan  -Symptom score and walking desaturation test at follow-up  - [place] for visitor to come with you to exam room

## 2021-03-27 NOTE — Progress Notes (Addendum)
OV 07/17/2020  Subjective:  Patient ID: Nicholas Webb, male , DOB: November 02, 1940 , age 81 y.o. , MRN: [id number] , ADDRESS: [address] [place] [address] PCP Nicholas Small, MD Patient Care Team: Nicholas Small, MD as PCP - General (Family Medicine) Nicholas Pain, MD as PCP - Cardiology (Cardiology) Nicholas Danes, PA-C as Physician Assistant (Dermatology) Nicholas Monarch, MD as Consulting Physician (Dermatology)  This Provider for this visit: Treatment Team:  Attending Provider: Brand Males, MD    07/17/2020 -   Chief Complaint  Patient presents with   Follow-up    PFT performed 06/17/20. Pt states he is about the same since last visit.   Rheumatoid arthritis with interstitial lung disease History of pulmonary embolism on Eliquis - since April 2020 History of coronary artery disease follows Dr. Marlou Webb  HPI Nicholas Webb 81 y.o. -presents for follow-up.  He is accompanied by his daughter.  This visit is to focus on discussing the results.  His symptoms are extremely mild from a respiratory standpoint documented below.  His high-resolution CT chest shows interstitial lung disease probable UIP pattern.  He also has multiple Webb pulmonary nodules.  There is coronary artery calcification [he is known to have coronary artery disease and follows Dr. Marlou Webb.  His pulmonary function test shows very mild restriction from his ILD.  His walking desaturation test is essentially close to normal except for tachycardia.  Taken together the burden of ILD is mild.  His daughter wants to know how to approach his ILD.  They report that he was on methotrexate for many years [greater than 10].  There was wheezing and that is why he was referred to [place].  He is currently off methotrexate.  He started working out at [employer] well     HRCT Chest data march 2022 CLINICAL DATA:  Interstitial lung disease, history of rheumatoid arthritis   EXAM: CT CHEST WITHOUT CONTRAST    TECHNIQUE: Multidetector CT imaging of the chest was performed following the standard protocol without intravenous contrast. High resolution imaging of the lungs, as well as inspiratory and expiratory imaging, was performed.   COMPARISON:  None.   FINDINGS: Cardiovascular: Aortic atherosclerosis. Normal heart size. Left coronary artery calcifications and stents. No pericardial effusion.   Mediastinum/Nodes: No enlarged mediastinal, hilar, or axillary lymph nodes. Thyroid gland, trachea, and esophagus demonstrate no significant findings.   Lungs/Pleura: There is moderate pulmonary fibrosis in a pattern with apical to basal gradient featuring irregular peripheral interstitial opacity, septal thickening, mild traction bronchiectasis, areas of subpleural bronchiolectasis without clear evidence of honeycombing. No significant air trapping on expiratory phase imaging. There multiple Webb pulmonary nodules in the bilateral lung apices, generally irregular in morphology and measuring up to 5 mm, for example in the left apex (series 10, image 58). No pleural effusion or pneumothorax.   Upper Abdomen: No acute abnormality.   Musculoskeletal: No chest wall mass or suspicious bone lesions identified.  IMPRESSION: 1. There is moderate pulmonary fibrosis in a pattern with apical to basal gradient featuring irregular peripheral interstitial opacity, septal thickening, mild traction bronchiectasis, and areas of subpleural bronchiolectasis without clear evidence of honeycombing. Findings are consistent with a "probable UIP" pattern and generally in keeping with rheumatoid associated interstitial lung disease. Findings are categorized as probable UIP per consensus guidelines: Diagnosis of Idiopathic Pulmonary Fibrosis: An Official ATS/ERS/JRS/ALAT Clinical Practice Guideline. [place], Iss 5, [phone], Oct 31 2016. 2. There multiple Webb nonspecific  pulmonary  nodules in the bilateral lung apices, generally irregular in morphology and measuring 5 mm and smaller. No follow-up needed if patient is low-risk (and has no known or suspected primary neoplasm). Non-contrast chest CT can be considered in 12 months if patient is high-risk. This recommendation follows the consensus statement: Guidelines for Management of Incidental Pulmonary Nodules Detected on CT Images: From the Fleischner Society 2017; Radiology 2017; 284:228-243. 3. Coronary artery disease.   Aortic Atherosclerosis (ICD10-I70.0).     Electronically Signed   By: Nicholas Webb M.D.   On: 05/22/2020 11:58      OV 01/16/2021  Subjective:  Patient ID: Nicholas Webb, male , DOB: 1940/03/08 , age 52 y.o. , MRN: [id number] , ADDRESS: [address] [place] [address] PCP Nicholas Small, MD (Inactive) Patient Care Team: Nicholas Small, MD (Inactive) as PCP - General (Family Medicine) Nicholas Pain, MD as PCP - Cardiology (Cardiology) Starlyn Skeans as Physician Assistant (Dermatology) Nicholas Monarch, MD as Consulting Physician (Dermatology)  This Provider for this visit: Treatment Team:  Attending Provider: Brand Males, MD   Rheumatoid arthritis with interstitial lung disease  -Started on Imuran by rheumatology between May 2022 and November 2022  -Last HRCT March 2022 History of pulmonary embolism on Eliquis - since April 2020 History of coronary artery disease follows Dr. Marlou Webb Pulmonary nodules associated with RA ILD  01/16/2021 -   Chief Complaint  Patient presents with   Follow-up    No new concerns, pft done today     HPI Nicholas Webb 81 y.o. -presents for routine follow-up.  He feels stable.  His dyspnea score is all altered.  It is documented below.  His walking desaturation test simple is also unchanged.  He feels great.  In the interim his rheumatologist started him on Imuran for his rheumatoid arthritis Webb.  He feels good overall.   However his pulmonary function test shows 3-4% decline in FVC and a 16% decline in DLCO.  He is not feeling this.  We discussed about adding antifibrotic to his Imuran with the understanding that the Imuran itself might be having a antifibrotic effect.  He does not want to start this right now.     CT Chest data  No results found.  OV 03/27/2021  Subjective:  Patient ID: Nicholas Webb, male , DOB: 06/09/1940 , age 44 y.o. , MRN: [id number] , ADDRESS: [address] [place] [address] PCP Nicholas Small, MD Patient Care Team: Nicholas Small, MD as PCP - General (Family Medicine) Nicholas Pain, MD as PCP - Cardiology (Cardiology) Nicholas Danes, PA-C as Physician Assistant (Dermatology) Nicholas Monarch, MD as Consulting Physician (Dermatology)  This Provider for this visit: Treatment Team:  Attending Provider: Brand Males, MD    03/27/2021 -   Chief Complaint  Patient presents with   Follow-up    10 week follow up with pft. Pt states that breathing is the same    Rheumatoid arthritis with interstitial lung disease  -Started on Imuran by rheumatology between May 2022 and November 2022  -Last HRCT March 2022 History of pulmonary embolism on Eliquis - since April 2020 History of coronary artery disease follows Dr. Marlou Webb Pulmonary nodules associated with RA ILD - 44mm UL in march 2022  HPI ELYAN VANWIEREN 81 y.o. -returns for follow-up.  He presents with his daughter Jodi Mourning who is nurse practitioner within the [facility] health system.  Since his last visit he stoppe exercises at [facility].  His daughter tried to encourage him in my presence.  I also encouraged him.  However he is not that motivated.  He continues on Imuran.  He feels his breathing is the same.  His symptom score shows possibly symptoms are slightly worse but he feels the same.  We did pulmonary function test today and compared to November 2022 it is stable but compared to spring of last year the DLCO has  declined.  This decline was apparent between November 2022 in April 2022.  Since starting Imuran lung function is stable.  Overall will deem this is stability.  He is interested in participating in research protocol.  In terms of lung nodules are very Webb.  Very likely due to rheumatoid.  Last CT scan was in March 2022.  1 year CT scan will be the spring 2023 or summer 2023.      SYMPTOM SCALE - ILD 07/17/2020  03/27/2021    O2 use ra ra  Shortness of Breath 0 -> 5 scale with 5 being worst (score 6 If unable to do)   At rest 0 0  Simple tasks - showers, clothes change, eating, shaving 0 0  Household (dishes, doing bed, laundry) 1 2  Shopping 0 0  Walking level at own pace 0 2  Walking up Stairs 1 2  Total (30-36) Dyspnea Score 2 6  How bad is your cough? minimal 1  How bad is your fatigue 0 0  How bad is nausea 0 0  How bad is vomiting?  0 0  How bad is diarrhea? 0 0  How bad is anxiety? 00 2  How bad is depression 0 0       Simple office walk 185 feet x  3 laps goal with forehead probe 07/17/2020  01/16/2021  03/27/2021   O2 used ra ra Ra  Number laps completed 3 3   Comments about pace avg avg   Resting Pulse Ox/HR 98% and 74/min 97% and 71/min 99% and 67  Final Pulse Ox/HR 96% and 108/min 96% and HR 106 95% and 106  Desaturated </= 88% no no no  Desaturated <= 3% points no no yes  Got Tachycardic >/= 90/min yes yes yes  Symptoms at end of test none none none  Miscellaneous comments x No change x    PFT  PFT Results Latest Ref Rng & Units 03/27/2021 01/16/2021 06/17/2020  FVC-Pre L 3.61 3.56 3.71  FVC-Predicted Pre % 96 95 98  FVC-Post L - - 3.75  FVC-Predicted Post % - - 99  Pre FEV1/FVC % % 82 81 81  Post FEV1/FCV % % - - 84  FEV1-Pre L 2.95 2.88 3.01  FEV1-Predicted Pre % 110 108 111  FEV1-Post L - - 3.14  DLCO uncorrected ml/min/mmHg 15.93 15.03 18.32  DLCO UNC% % 69 65 78  DLCO corrected ml/min/mmHg 15.93 15.03 18.32  DLCO COR %Predicted % 69 65 78   DLVA Predicted % 80 78 96  TLC L - - 5.30  TLC % Predicted % - - 79  RV % Predicted % - - 67   CT chest high resl 05/21/20  Narrative & Impression  CLINICAL DATA:  Interstitial lung disease, history of rheumatoid arthritis   EXAM: CT CHEST WITHOUT CONTRAST   TECHNIQUE: Multidetector CT imaging of the chest was performed following the standard protocol without intravenous contrast. High resolution imaging of the lungs, as well as inspiratory and expiratory imaging, was performed.   COMPARISON:  None.  FINDINGS: Cardiovascular: Aortic atherosclerosis. Normal heart size. Left coronary artery calcifications and stents. No pericardial effusion.   Mediastinum/Nodes: No enlarged mediastinal, hilar, or axillary lymph nodes. Thyroid gland, trachea, and esophagus demonstrate no significant findings.   Lungs/Pleura: There is moderate pulmonary fibrosis in a pattern with apical to basal gradient featuring irregular peripheral interstitial opacity, septal thickening, mild traction bronchiectasis, areas of subpleural bronchiolectasis without clear evidence of honeycombing. No significant air trapping on expiratory phase imaging. There multiple Webb pulmonary nodules in the bilateral lung apices, generally irregular in morphology and measuring up to 5 mm, for example in the left apex (series 10, image 58). No pleural effusion or pneumothorax.   Upper Abdomen: No acute abnormality.   Musculoskeletal: No chest wall mass or suspicious bone lesions identified.   IMPRESSION: 1. There is moderate pulmonary fibrosis in a pattern with apical to basal gradient featuring irregular peripheral interstitial opacity, septal thickening, mild traction bronchiectasis, and areas of subpleural bronchiolectasis without clear evidence of honeycombing. Findings are consistent with a "probable UIP" pattern and generally in keeping with rheumatoid associated interstitial lung disease. Findings are  categorized as probable UIP per consensus guidelines: Diagnosis of Idiopathic Pulmonary Fibrosis: An Official ATS/ERS/JRS/ALAT Clinical Practice Guideline. [place], Iss 5, [phone], Oct 31 2016. 2. There multiple Webb nonspecific pulmonary nodules in the bilateral lung apices, generally irregular in morphology and measuring 5 mm and smaller. No follow-up needed if patient is low-risk (and has no known or suspected primary neoplasm). Non-contrast chest CT can be considered in 12 months if patient is high-risk. This recommendation follows the consensus statement: Guidelines for Management of Incidental Pulmonary Nodules Detected on CT Images: From the Fleischner Society 2017; Radiology 2017; 284:228-243. 3. Coronary artery disease.   Aortic Atherosclerosis (ICD10-I70.0).     Electronically Signed   By: Nicholas Webb M.D.   On: 05/22/2020 11:58        has a past medical history of Anemia of chronic disease, Atherosclerosis of both carotid arteries, Atypical mole (03/28/2013), Barrett's esophagus, Basal cell carcinoma (12/17/2015), Dizziness, GERD (gastroesophageal reflux disease), adenomatous colonic polyps, Hyperlipidemia, Macrocytosis without anemia, Obesity, OSA (obstructive sleep apnea), Rheumatoid arthritis(714.0), Squamous cell carcinoma of skin (02/25/2010), Squamous cell carcinoma of skin (02/25/2010), Squamous cell carcinoma of skin (03/24/2011), Squamous cell carcinoma of skin (03/24/2011), Squamous cell carcinoma of skin (03/24/2011), Squamous cell carcinoma of skin (05/05/2011), Squamous cell carcinoma of skin (11/28/2013), Squamous cell carcinoma of skin (12/17/2015), Squamous cell carcinoma of skin (12/17/2015), Squamous cell carcinoma of skin (01/03/2016), Squamous cell carcinoma of skin (04/22/2016), Squamous cell carcinoma of skin (10/06/2016), Squamous cell carcinoma of skin (12/22/2016), Squamous cell carcinoma of skin (01/11/2018), Squamous cell  carcinoma of skin (01/11/2018), Squamous cell carcinoma of skin (01/11/2018), Squamous cell carcinoma of skin (01/11/2018), Squamous cell carcinoma of skin (03/07/2019), Squamous cell carcinoma of skin (03/07/2019), Squamous cell carcinoma of skin (03/07/2019), Squamous cell carcinoma of skin (01/04/2020), Squamous cell carcinoma of skin (01/04/2020), Squamous cell carcinoma of skin (01/04/2020), and Syncope.   reports that he has never smoked. He has never used smokeless tobacco.  Past Surgical History:  Procedure Laterality Date   CORONARY STENT INTERVENTION N/A 06/24/2018   Procedure: CORONARY STENT INTERVENTION;  Surgeon: Burnell Blanks, MD;  Location: [facility];  Service: Cardiovascular;  Laterality: N/A;   IR IVC FILTER PLMT / S&I /IMG GUID/MOD SED  10/07/2018   IR IVC FILTER RETRIEVAL / S&I [place]els GUID/MOD SED  06/15/2019   IR RADIOLOGIST EVAL &  MGMT  09/27/2018   IR RADIOLOGIST EVAL & MGMT  01/04/2019   JOINT REPLACEMENT  2004   left   LEFT HEART CATH AND CORONARY ANGIOGRAPHY N/A 06/24/2018   Procedure: LEFT HEART CATH AND CORONARY ANGIOGRAPHY;  Surgeon: Burnell Blanks, MD;  Location: [facility];  Service: Cardiovascular;  Laterality: N/A;    No Known Allergies  Immunization History  Administered Date(s) Administered   Influenza, High Dose Seasonal PF 12/18/2013, 12/18/2014, 12/13/2017   Influenza, Quadrivalent, Recombinant, Inj, Pf 12/14/2016, 12/07/2018   Influenza-Unspecified 12/01/2012, 11/30/2020   PFIZER(Purple Top)SARS-COV-2 Vaccination 03/23/2019, 04/13/2019, 11/30/2020   Tdap 06/23/2018   Zoster Recombinat (Shingrix) 05/08/2017, 07/09/2017    Family History  Problem Relation Age of Onset   Stroke Mother    Glaucoma Mother    Cancer Father    Prostate cancer Father    Colon cancer Father      Current Outpatient Medications:    atorvastatin (LIPITOR) 40 MG tablet, TAKE 1 TABLET(40 MG) BY MOUTH DAILY, Disp: 90 tablet, Rfl: 2    azaTHIOprine (IMURAN) 50 MG tablet, Take 50 mg by mouth 2 (two) times daily., Disp: , Rfl:    ELIQUIS 5 MG TABS tablet, TAKE 1 TABLET(5 MG) BY MOUTH TWICE DAILY, Disp: 60 tablet, Rfl: 5   esomeprazole (NEXIUM) 20 MG capsule, Take 20 mg by mouth daily at 12 noon. , Disp: , Rfl:    fluocinonide ointment (LIDEX) 0.05 %, Apply topically as needed., Disp: , Rfl:    Multiple Vitamins-Minerals (MULTIVITAMIN WITH MINERALS) tablet, Take 1 tablet by mouth daily., Disp: , Rfl:    Niacinamide-Zn-Cu-Methfo-Se-Cr (NICOTINAMIDE PO), Take 500 mg by mouth., Disp: , Rfl:    predniSONE (DELTASONE) 5 MG tablet, 1 tablet, Disp: , Rfl:       Objective:   Vitals:   03/27/21 1051  BP: 118/68  Pulse: (!) 59  Temp: 98.5 F (36.9 C)  TempSrc: Oral  SpO2: 96%  Weight: 196 lb (88.9 kg)  Height: 5\' 8"  (1.727 m)    Estimated body mass index is 29.8 kg/m as calculated from the following:   Height as of this encounter: 5\' 8"  (1.727 m).   Weight as of this encounter: 196 lb (88.9 kg).  @WEIGHTCHANGE @  [employer]   03/27/21 1051  Weight: 196 lb (88.9 kg)     Physical Exam    General: No distress. Looks well Neuro: Alert and Oriented x 3. GCS 15. Speech normal Psych: Pleasant Resp:  Barrel Chest - no.  Wheeze - no, Crackles - YES, No overt respiratory distress CVS: Normal heart sounds. Murmurs - no Ext: Stigmata of Connective Tissue Disease - YES MILD RA HEENT: Normal upper airway. PEERL +. No post nasal drip        Assessment:       ICD-10-CM   1. ILD (interstitial lung disease) ([place])  J84.9     2. Multiple lung nodules on CT  R91.8          Plan:     Patient Instructions     ICD-10-CM   1. Interstitial lung disease due to connective tissue disease ([place])  J84.89    M35.9   2. ILD (interstitial lung disease) ([place])  J84.9 Pulmonary function test    Pulse oximetry, overnight    CT CHEST HIGH RESOLUTION  3. History of rheumatoid arthritis  Z87.39   4. 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   -  is currently mild based on breathing test and walk test and symptoms  - There appears to be mild progerssion on pft between April 2022 and Nov 2022 -> stable since then  - I think immuran is helping control the ILD  Multiple Webb lung nodules probably due to rheumatoid arthritis (March 2022 last CT)]  Plan -\continue immuran via Dr Dossie Der - Hold off on starting anti-fibrotic ofev/esbriet  - based on shared decision making -  [facility] study team will ccontact you about ILD PRO registry in the next week -Do spirometry and DLCO in 6 months - restart sagwell exercise  - Do HRCT timing in 6 months for nodules (last march 2022)  Follow-up - Return in 6 months -but after breathing test and CT scan  -Symptom score and walking desaturation test at follow-up  - [place] for visitor to come with you to exam room    SIGNATURE    Dr. Brand Webb, M.D., F.C.C.P,  Pulmonary and Critical Care Medicine Staff Physician, [place] Director - Interstitial Lung Disease  Program  Pulmonary [place] [facility] [place], [place], [address]  Pager: [phone], If no answer or between  15:00h - 7:00h: call [phone] Telephone: [phone]  11:31 AM 03/27/2021

## 2021-04-07 DIAGNOSIS — H5203 Hypermetropia, bilateral: Secondary | ICD-10-CM | POA: Diagnosis not present

## 2021-04-07 DIAGNOSIS — H11153 Pinguecula, bilateral: Secondary | ICD-10-CM | POA: Diagnosis not present

## 2021-04-07 DIAGNOSIS — H35373 Puckering of macula, bilateral: Secondary | ICD-10-CM | POA: Diagnosis not present

## 2021-04-07 DIAGNOSIS — H43813 Vitreous degeneration, bilateral: Secondary | ICD-10-CM | POA: Diagnosis not present

## 2021-04-07 DIAGNOSIS — H18523 Epithelial (juvenile) corneal dystrophy, bilateral: Secondary | ICD-10-CM | POA: Diagnosis not present

## 2021-04-07 DIAGNOSIS — Z961 Presence of intraocular lens: Secondary | ICD-10-CM | POA: Diagnosis not present

## 2021-04-07 DIAGNOSIS — H52223 Regular astigmatism, bilateral: Secondary | ICD-10-CM | POA: Diagnosis not present

## 2021-04-15 ENCOUNTER — Other Ambulatory Visit: Payer: Self-pay

## 2021-04-15 ENCOUNTER — Encounter: Payer: Medicare PPO | Admitting: *Deleted

## 2021-04-15 DIAGNOSIS — J849 Interstitial pulmonary disease, unspecified: Secondary | ICD-10-CM

## 2021-04-15 DIAGNOSIS — Z006 Encounter for examination for normal comparison and control in clinical research program: Secondary | ICD-10-CM

## 2021-04-15 NOTE — Research (Signed)
Title: Chronic Fibrosing Interstitial Lung Disease with Progressive Phenotype Prospective Outcomes (ILD-PRO) Registry    Protocol #: IPF-PRO-SUB, Clinical Trials # [id number], Sponsor: [employer]   Protocol Version Amendment 4 dated 12Sep2019  and confirmed current on  Consent Version for todays visit date of  Is [name] [employer] Approved Version 03 Feb 2018 Revised 03 Feb 2018   Objectives:  Describe current approaches to diagnosis and treatment of chronic fibrosing ILDs with progressive phenotype  Describe the natural history of chronic fibrosing ILDs with progressive phenotype  Assess quality of life from self-administered participant reported questionnaires for each disease group  Describe participant interactions with the healthcare system, describe treatment practices across multiple institutions for each disease group  Collect biological samples linked to well characterized chronic fibrosing ILDs with progressive phenotype to identify disease biomarkers  Collect data and biological samples that will support future research studies.                                            Key Inclusion Criteria: Willing and able to provide informed consent  Age ? 30 years  Diagnosis of a non-IPF ILD of any duration, including, but not limited to Idiopathic Non-Specific Interstitial Pneumonia (INSIP), Unclassifiable Idiopathic Interstitial Pneumonias (IIPs), Interstitial Pneumonia with Autoimmune Features (IPAF), Autoimmune ILDs such as Rheumatoid Arthritis (RA-ILD) and Systemic Sclerosis (SSC-ILD), Chronic Hypersensitivity Pneumonitis (HP), Sarcoidosis or Exposure-related ILDs such as asbestosis.  Chronic fibrosing ILD defined by reticular abnormality with traction bronchiectasis with or without honeycombing confirmed by chest HRCT scan and/or lung biopsy.  Progressive phenotype as defined by fulfilling at least one of the criteria below of fibrotic changes (progression set point)  within the last 24 months regardless of treatment considered appropriate in individual ILDs:  decline in FVC % predicted (% pred) based on >10% relative decline  decline in FVC % pred based on ? 5 - <10% relative decline in FVC combined with worsening of respiratory symptoms as assessed by the site investigator  decline in FVC % pred based on ? 5 - <10% relative decline in FVC combined with increasing extent of fibrotic changes on chest imaging (HRCT scan) as assessed by the site investigator  decline in DLCO % pred based on ? 10% relative decline  worsening of respiratory symptoms as well as increasing extent of fibrotic changes on chest imaging (HRCT scan) as assessed by the site investigator independent of FVC change.     Key Exclusion Criteria: Malignancy, treated or untreated, other than skin or early stage prostate cancer, within the past 5 years  Currently listed for lung transplantation at the time of enrollment  Currently enrolled in a clinical trial at the time of enrollment in this registry       Clinical Research Coordinator / Research RN note : This visit is for [facility] Nicholas Webb Subject [phone] with DOB: [id number] on 04/15/2021 for the above protocol is an Enrollment Visit/Encounter and is for purpose of research.    Subject expressed interest and consent in continuing as a study subject. Subject confirmed contact information (e.g. address, telephone, email). Subject thanked for participation in research and contribution to science.      During this visit on 04/15/2021 , the subject completed the blood work and questionnaires per the above referenced protocol. Subject reviewed and signed consent form. Please refer to the subject's paper source binder for further details.  Signed by [place] Assistant [facility]  [place], [place] 09:48 AM 04/15/2021

## 2021-04-28 DIAGNOSIS — M0589 Other rheumatoid arthritis with rheumatoid factor of multiple sites: Secondary | ICD-10-CM | POA: Diagnosis not present

## 2021-04-28 DIAGNOSIS — M150 Primary generalized (osteo)arthritis: Secondary | ICD-10-CM | POA: Diagnosis not present

## 2021-04-28 DIAGNOSIS — R0602 Shortness of breath: Secondary | ICD-10-CM | POA: Diagnosis not present

## 2021-04-28 DIAGNOSIS — Z79899 Other long term (current) drug therapy: Secondary | ICD-10-CM | POA: Diagnosis not present

## 2021-04-28 DIAGNOSIS — M8580 Other specified disorders of bone density and structure, unspecified site: Secondary | ICD-10-CM | POA: Diagnosis not present

## 2021-04-28 DIAGNOSIS — E559 Vitamin D deficiency, unspecified: Secondary | ICD-10-CM | POA: Diagnosis not present

## 2021-04-28 DIAGNOSIS — I2510 Atherosclerotic heart disease of native coronary artery without angina pectoris: Secondary | ICD-10-CM | POA: Diagnosis not present

## 2021-05-15 ENCOUNTER — Encounter: Payer: Self-pay | Admitting: Physician Assistant

## 2021-05-15 ENCOUNTER — Other Ambulatory Visit: Payer: Self-pay

## 2021-05-15 ENCOUNTER — Ambulatory Visit: Payer: Medicare PPO | Admitting: Physician Assistant

## 2021-05-15 DIAGNOSIS — L089 Local infection of the skin and subcutaneous tissue, unspecified: Secondary | ICD-10-CM

## 2021-05-15 DIAGNOSIS — Z85828 Personal history of other malignant neoplasm of skin: Secondary | ICD-10-CM

## 2021-05-15 DIAGNOSIS — L570 Actinic keratosis: Secondary | ICD-10-CM

## 2021-05-15 DIAGNOSIS — L820 Inflamed seborrheic keratosis: Secondary | ICD-10-CM

## 2021-05-15 MED ORDER — MUPIROCIN 2 % EX OINT: TOPICAL_OINTMENT | CUTANEOUS | 4 refills | Status: DC

## 2021-05-15 NOTE — Progress Notes (Signed)
? ?  Follow-Up Visit ?  ?Subjective  ?Nicholas Webb is a 81 y.o. male who presents for the following: Follow-up (Patient here today for follow up from treatment that was done on SCC on his mid parietal scalp, right scalp superior, and left parietal scalp. Per patient he has some new lesions on his face that he would like checked per patient no bleeding from any of the lesions just sore. ). He was unsure if he hit his head.  ? ? ?The following portions of the chart were reviewed this encounter and updated as appropriate:  Tobacco  Allergies  Meds  Problems  Med Hx  Surg Hx  Fam Hx   ?  ? ?Objective  ?Well appearing patient in no apparent distress; mood and affect are within normal limits. ? ?A focused examination was performed including face. Relevant physical exam findings are noted in the Assessment and Plan. ? ?Head - Anterior (Face) (3), Left Ear, Right Buccal Cheek (5), Right Temple (2) ?Erythematous patches with gritty scale. ? ?Mid Frontal Scalp ?Yellow discharge on a previously treated scc.  ? ?Left Buccal Cheek (4), Right Forehead (4) ?Brown crusts on an erythematous base.  ? ? ?Assessment & Plan  ?AK (actinic keratosis) (11) ?Head - Anterior (Face) (3); Left Ear; Right Temple (2); Right Buccal Cheek (5) ? ?Destruction of lesion - Head - Anterior (Face), Left Ear, Right Buccal Cheek, Right Temple ?Complexity: simple   ?Destruction method: cryotherapy   ?Informed consent: discussed and consent obtained   ?Timeout:  patient name, date of birth, surgical site, and procedure verified ?Lesion destroyed using liquid nitrogen: Yes   ?Cryotherapy cycles:  3 ?Outcome: patient tolerated procedure well with no complications   ?Post-procedure details: wound care instructions given   ? ?Infection of skin and subcutaneous tissue ?Mid Frontal Scalp ? ?Anaerobic and Aerobic Culture - Mid Frontal Scalp ? ?mupirocin ointment (BACTROBAN) 2 % - Mid Frontal Scalp ?Apply to wound qd-bid ? ?Seborrheic keratosis, inflamed  (8) ?Left Buccal Cheek (4); Right Forehead (4) ? ?Destruction of lesion - Left Buccal Cheek, Right Forehead ?Complexity: simple   ?Destruction method: cryotherapy   ?Informed consent: discussed and consent obtained   ?Timeout:  patient name, date of birth, surgical site, and procedure verified ?Lesion destroyed using liquid nitrogen: Yes   ?Cryotherapy cycles:  3 ?Outcome: patient tolerated procedure well with no complications   ?Post-procedure details: wound care instructions given   ? ? ?No atypical nevi noted at the time of the visit. ?I, Addisynn Vassell, PA-C, have reviewed all documentation's for this visit.  The documentation on 05/15/21 for the exam, diagnosis, procedures and orders are all accurate and complete. ?

## 2021-05-21 LAB — ANAEROBIC AND AEROBIC CULTURE
MICRO NUMBER:: 13140176
MICRO NUMBER:: 13140177
SPECIMEN QUALITY:: ADEQUATE
SPECIMEN QUALITY:: ADEQUATE

## 2021-05-22 ENCOUNTER — Telehealth: Payer: Self-pay | Admitting: Physician Assistant

## 2021-05-22 NOTE — Telephone Encounter (Signed)
Patient calling for culture results

## 2021-05-25 ENCOUNTER — Other Ambulatory Visit: Payer: Self-pay | Admitting: Cardiology

## 2021-05-25 DIAGNOSIS — I2510 Atherosclerotic heart disease of native coronary artery without angina pectoris: Secondary | ICD-10-CM

## 2021-05-26 NOTE — Telephone Encounter (Signed)
Bacteria results to patient- patient states healing good no concerns. Told to call us if need.  ?

## 2021-05-26 NOTE — Telephone Encounter (Signed)
-----   Message from Warren Danes, [place] sent at 05/22/2021  4:09 PM EDT ----- ?Check status oral antibiotic if not healing ?

## 2021-06-05 DIAGNOSIS — M150 Primary generalized (osteo)arthritis: Secondary | ICD-10-CM | POA: Diagnosis not present

## 2021-06-05 DIAGNOSIS — M62838 Other muscle spasm: Secondary | ICD-10-CM | POA: Diagnosis not present

## 2021-06-05 DIAGNOSIS — E559 Vitamin D deficiency, unspecified: Secondary | ICD-10-CM | POA: Diagnosis not present

## 2021-06-05 DIAGNOSIS — R0602 Shortness of breath: Secondary | ICD-10-CM | POA: Diagnosis not present

## 2021-06-05 DIAGNOSIS — M0589 Other rheumatoid arthritis with rheumatoid factor of multiple sites: Secondary | ICD-10-CM | POA: Diagnosis not present

## 2021-06-05 DIAGNOSIS — I2510 Atherosclerotic heart disease of native coronary artery without angina pectoris: Secondary | ICD-10-CM | POA: Diagnosis not present

## 2021-06-05 DIAGNOSIS — M8580 Other specified disorders of bone density and structure, unspecified site: Secondary | ICD-10-CM | POA: Diagnosis not present

## 2021-06-05 DIAGNOSIS — Z79899 Other long term (current) drug therapy: Secondary | ICD-10-CM | POA: Diagnosis not present

## 2021-06-13 NOTE — Progress Notes (Signed)
? ?NEUROLOGY FOLLOW UP OFFICE NOTE ? ?Nicholas Webb ?[id number] ? ?Assessment/Plan:  ? ?Bilateral hand weakness/intrinsic hand muscle atrophy.  ? ?Given associated balance problems, will check MRI of cervical spine to evaluate for central canal and neural foraminal stenosis.  Based on results, consider NCV-EMG upper extremities.  ? ?Subjective:  ?Nicholas Webb is a 81 year old right-handed male with rheumatoid arthritis, obstructive sleep apnea, hyperlipidemia, vestibular schwannoma and carotid artery disease who presents for bilateral hand weakness.  He is accompanied by his daughter who supplements history. ?  ?UPDATE: ?He has rheumatoid arthritis.  He reports feeling weaker.  He is dropping objects.   Sometimes his hands contract.  Has some mild neck pain but denies proximal arm weakness, leg weakness, upper extremity radicular pain.  Only time he notes numbness is in his left hand if he is sitting up.  Notes balance problems.  His rheumatologist noted muscle wasting in the hands and thought he needed a NCV-EMG. ? ? ?PAST MEDICAL HISTORY: ?Past Medical History:  ?Diagnosis Date  ? Anemia of chronic disease   ? Atherosclerosis of both carotid arteries   ? Atypical mole 03/28/2013  ? moderate atypia on mid back  ? Barrett's esophagus   ? Basal cell carcinoma 12/17/2015  ? superficial on right postauricular - CX3+5FU  ? Dizziness   ? GERD (gastroesophageal reflux disease)   ? Hx of adenomatous colonic polyps   ? Hyperlipidemia   ? Macrocytosis without anemia   ? Obesity   ? OSA (obstructive sleep apnea)   ? Rheumatoid arthritis(714.0)   ? Squamous cell carcinoma of skin 02/25/2010  ? in situ on left sideburn, inferior - CX3+5FU  ? Squamous cell carcinoma of skin 02/25/2010  ? well differentiated on right sideburn - CX3+5FU  ? Squamous cell carcinoma of skin 03/24/2011  ? in situ on right temple - CX3+5FU  ? Squamous cell carcinoma of skin 03/24/2011  ? in situ on right jawline - CX3+5FU  ? Squamous cell  carcinoma of skin 03/24/2011  ? in situ on right angle of jaw - CX3+5FU  ? Squamous cell carcinoma of skin 05/05/2011  ? left temple - CX3+5FU  ? Squamous cell carcinoma of skin 11/28/2013  ? in situ on mid scalp - tx p bx  ? Squamous cell carcinoma of skin 12/17/2015  ? in situ on right post crown - CX3+5FU  ? Squamous cell carcinoma of skin 12/17/2015  ? verruca vulgaris with squamous atypia on right thumb - CX3+5FU  ? Squamous cell carcinoma of skin 01/03/2016  ? in situ on right crown post inf  ? Squamous cell carcinoma of skin 04/22/2016  ? well differentiated on left outer low leg  ? Squamous cell carcinoma of skin 10/06/2016  ? in situ on right subauricular - CX3+Cautery+5FU  ? Squamous cell carcinoma of skin 12/22/2016  ? in situ on mid post crown - tx p bx  ? Squamous cell carcinoma of skin 01/11/2018  ? in situ on right crown - tx p bx  ? Squamous cell carcinoma of skin 01/11/2018  ? in situ on mid crown - tx p bx  ? Squamous cell carcinoma of skin 01/11/2018  ? in situ on left crown - tx p bx  ? Squamous cell carcinoma of skin 01/11/2018  ? well differentiated on right temple - tx p bx  ? Squamous cell carcinoma of skin 03/07/2019  ? well differentiated on right neck - tx p bx  ? Squamous cell carcinoma of skin  03/07/2019  ? in situ on right temple - tx p bx  ? Squamous cell carcinoma of skin 03/07/2019  ? in situ on mid forehead - tx p bx  ? Squamous cell carcinoma of skin 01/04/2020  ? well diff- mid parietal scalp (CX35FU)  ? Squamous cell carcinoma of skin 01/04/2020  ? in situ- right scalp superior (CX35FU)  ? Squamous cell carcinoma of skin 01/04/2020  ? in situ-legt parietal scalp (CX35FU)   ? Syncope   ? ? ?MEDICATIONS: ?Current Outpatient Medications on File Prior to Visit  ?Medication Sig Dispense Refill  ? atorvastatin (LIPITOR) 40 MG tablet Take 1 tablet (40 mg total) by mouth daily. Please keep upcoming appt. In May with your Cardiologist in order to receive further refills. Thank You. 90  tablet 0  ? azaTHIOprine (IMURAN) 50 MG tablet Take 50 mg by mouth 2 (two) times daily.    ? ELIQUIS 5 MG TABS tablet TAKE 1 TABLET(5 MG) BY MOUTH TWICE DAILY 60 tablet 5  ? esomeprazole (NEXIUM) 20 MG capsule Take 20 mg by mouth daily at 12 noon.     ? fluocinonide ointment (LIDEX) 0.05 % Apply topically as needed.    ? Multiple Vitamins-Minerals (MULTIVITAMIN WITH MINERALS) tablet Take 1 tablet by mouth daily.    ? mupirocin ointment (BACTROBAN) 2 % Apply to wound qd-bid 22 g 4  ? Niacinamide-Zn-Cu-Methfo-Se-Cr (NICOTINAMIDE PO) Take 500 mg by mouth.    ? predniSONE (DELTASONE) 5 MG tablet 1 tablet    ? ?No current facility-administered medications on file prior to visit.  ? ? ?ALLERGIES: ?No Known Allergies ? ?FAMILY HISTORY: ?Family History  ?Problem Relation Age of Onset  ? Stroke Mother   ? Glaucoma Mother   ? Cancer Father   ? Prostate cancer Father   ? Colon cancer Father   ? ? ?  ?Objective:  ?Blood pressure 122/73, pulse 74, height '5\' 8"'$  (1.727 m), weight 191 lb 3.2 oz (86.7 kg), SpO2 96 %. ?General: No acute distress.  Patient appears well-groomed.   ?Head:  Normocephalic/atraumatic ?Eyes:  Fundi examined but not visualized ?Neck: supple, no paraspinal tenderness, full range of motion ?Neurological Exam: alert and oriented to person, place, and time.  Speech fluent and not dysarthric, language intact.  CN II-XII intact.  Atrophy of adductor pollicis muscles and abductor digiti minimi muscles bilaterally, muscle strength 4/5 interossei and APBs bilaterally, otherwise 5/5 throughout.  Sensation to pinprick and vibration intact.  Deep tendon reflexes 2+ throughout, toes downgoing.  Finger to nose testing intact.  Gait slightly broad-based but steady, Romberg with mild sway ? ? ?Metta Clines, DO ? ?CC:  Windy Carina, FNP ? ? ? ? ? ? ?

## 2021-06-16 ENCOUNTER — Ambulatory Visit: Payer: Medicare PPO | Admitting: Neurology

## 2021-06-16 ENCOUNTER — Encounter: Payer: Self-pay | Admitting: Neurology

## 2021-06-16 VITALS — BP 122/73 | HR 74 | Ht 68.0 in | Wt 191.2 lb

## 2021-06-16 DIAGNOSIS — R2681 Unsteadiness on feet: Secondary | ICD-10-CM

## 2021-06-16 DIAGNOSIS — R29898 Other symptoms and signs involving the musculoskeletal system: Secondary | ICD-10-CM | POA: Diagnosis not present

## 2021-06-16 NOTE — Patient Instructions (Signed)
We will start with MRI of cervical spine.  May need to consider nerve conduction study. ?Further recommendations pending results ?

## 2021-07-01 ENCOUNTER — Other Ambulatory Visit: Payer: Medicare PPO

## 2021-07-04 ENCOUNTER — Ambulatory Visit
Admission: RE | Admit: 2021-07-04 | Discharge: 2021-07-04 | Disposition: A | Discharge status: Home / Self Care | Payer: Medicare PPO | Source: Ambulatory Visit | Attending: Neurology | Admitting: Neurology

## 2021-07-04 ENCOUNTER — Encounter: Payer: Self-pay | Admitting: Podiatry

## 2021-07-04 ENCOUNTER — Ambulatory Visit: Payer: Medicare PPO | Admitting: Podiatry

## 2021-07-04 ENCOUNTER — Other Ambulatory Visit: Payer: Medicare PPO

## 2021-07-04 DIAGNOSIS — M79676 Pain in unspecified toe(s): Secondary | ICD-10-CM

## 2021-07-04 DIAGNOSIS — R2681 Unsteadiness on feet: Secondary | ICD-10-CM

## 2021-07-04 DIAGNOSIS — D689 Coagulation defect, unspecified: Secondary | ICD-10-CM | POA: Diagnosis not present

## 2021-07-04 DIAGNOSIS — B351 Tinea unguium: Secondary | ICD-10-CM

## 2021-07-04 DIAGNOSIS — M4802 Spinal stenosis, cervical region: Secondary | ICD-10-CM | POA: Diagnosis not present

## 2021-07-04 DIAGNOSIS — R29898 Other symptoms and signs involving the musculoskeletal system: Secondary | ICD-10-CM

## 2021-07-04 NOTE — Progress Notes (Signed)
This patient returns to my office for at risk foot care.  This patient requires this care by a professional since this patient will be at risk due to having coagulation defect.  Patient is taking eliquis.  This patient is unable to cut nails himself since the patient cannot reach his nails.These nails are painful walking and wearing shoes.  This patient presents for at risk foot care today.  General Appearance  Alert, conversant and in no acute stress.  Vascular  Dorsalis pedis and posterior tibial  pulses are palpable  bilaterally.  Capillary return is within normal limits  bilaterally. Temperature is within normal limits  bilaterally.  Neurologic  Senn-Weinstein monofilament wire test within normal limits  bilaterally. Muscle power within normal limits bilaterally.  Nails Thick disfigured discolored nails with subungual debris  from hallux to fifth toes bilaterally. No evidence of bacterial infection or drainage bilaterally.  Orthopedic  No limitations of motion  feet .  No crepitus or effusions noted.  No bony pathology or digital deformities noted.  DJD 1st MPJ right foot. Bony prominence medial aspect both rearfeet.  Skin  normotropic skin with no porokeratosis noted bilaterally.  No signs of infections or ulcers noted.     Onychomycosis  Pain in right toes  Pain in left toes  Consent was obtained for treatment procedures.   Mechanical debridement of nails 1-5  bilaterally performed with a nail nipper.  Filed with dremel without incident.    Return office visit    4 months                 Told patient to return for periodic foot care and evaluation due to potential at risk complications.   Nikitia Asbill DPM  

## 2021-07-08 NOTE — Progress Notes (Signed)
LMOVM for pt to call the office back.

## 2021-07-09 NOTE — Progress Notes (Signed)
LMOVM for patient to call the office back.

## 2021-07-17 ENCOUNTER — Ambulatory Visit: Payer: Medicare PPO | Admitting: Cardiology

## 2021-07-17 ENCOUNTER — Encounter: Payer: Self-pay | Admitting: Cardiology

## 2021-07-17 VITALS — BP 120/80 | HR 64 | Ht 68.0 in | Wt 183.0 lb

## 2021-07-17 DIAGNOSIS — D689 Coagulation defect, unspecified: Secondary | ICD-10-CM | POA: Diagnosis not present

## 2021-07-17 DIAGNOSIS — I2699 Other pulmonary embolism without acute cor pulmonale: Secondary | ICD-10-CM | POA: Diagnosis not present

## 2021-07-17 DIAGNOSIS — I2510 Atherosclerotic heart disease of native coronary artery without angina pectoris: Secondary | ICD-10-CM

## 2021-07-17 DIAGNOSIS — I824Z2 Acute embolism and thrombosis of unspecified deep veins of left distal lower extremity: Secondary | ICD-10-CM | POA: Diagnosis not present

## 2021-07-17 DIAGNOSIS — J8410 Pulmonary fibrosis, unspecified: Secondary | ICD-10-CM

## 2021-07-17 DIAGNOSIS — E7800 Pure hypercholesterolemia, unspecified: Secondary | ICD-10-CM

## 2021-07-17 DIAGNOSIS — I4510 Unspecified right bundle-branch block: Secondary | ICD-10-CM | POA: Diagnosis not present

## 2021-07-17 NOTE — Assessment & Plan Note (Signed)
Continue with lifelong Eliquis 5 mg twice a day.  Hematology oncology, Dr. Benay Spice.  Notes reviewed.  Hemoglobin 12.7, creatinine 1.0.

## 2021-07-17 NOTE — Patient Instructions (Signed)

## 2021-07-17 NOTE — Assessment & Plan Note (Signed)
Continue with Eliquis. ?

## 2021-07-17 NOTE — Assessment & Plan Note (Signed)
Continue with atorvastatin 40 mg no myalgias.  LDL at goal at 51.

## 2021-07-17 NOTE — Assessment & Plan Note (Signed)
Stable, no changes  

## 2021-07-17 NOTE — Assessment & Plan Note (Signed)
LAD stent 2020.  Continue with Eliquis.  He is not on aspirin because of this.  Stable, no anginal symptoms.  On atorvastatin 40 mg.  Excellent.  LDL 51.

## 2021-07-17 NOTE — Assessment & Plan Note (Signed)
Prior IVC filter has been removed.  Continue with Eliquis

## 2021-07-17 NOTE — Assessment & Plan Note (Signed)
Currently on study drug with Dr. Chase Caller.  Subtle crackles heard on exam.

## 2021-07-17 NOTE — Progress Notes (Signed)
Cardiology Office Note:    Date:  07/17/2021   ID:  Nicholas Webb, DOB [birth year], MRN [id number]  PCP:  Saintclair Halsted, FNP   [facility] Providers Cardiologist:  Candee Furbish, MD     Referring MD: Maurice Small, MD   History of Present Illness:    Nicholas Webb is a 81 y.o. male here for coronary arterial disease and hyperlipidemia.  Has seen Dr. Chase Caller with pulmonary for suspected pulmonary fibrosis secondary to rheumatoid arthritis.  Had a stent placement to his LAD on 06/24/2018.  This was complicated by acknowledgment of a skull mass.  The skull mass on its own, decreased in size.  Possible infection.  No biopsy was needed by neurosurgery.  Plavix was stopped because of concomitant Eliquis use for bilateral PE.   Previously had a syncopal episode and pulmonary embolism was discovered.  He had been taking Eliquis.  Oncology has been following.  IVC filter had been removed.   Reviewed Dr. Gearldine Shown note from oncology on 06/16/2019.  IVC filter had been removed once again.  At his last appointment he reported some pain of his left axillary region when he reaches or leans over.  He joined the [employer].  His colonoscopy reportedly went well.    Works at the [employer].  Today: He is feeling good. However, he does note that he had some left sided abdominal/chest pain. This could have been related to exercise or from his work, but he is not certain. At this time it has not yet recurred.  He has successfully lost some weight, he is down to 183 lbs from over 190 in January 2023.  He remains compliant with Eliquis. No bleeding issues.  He is involved with a study regarding his pulmonary fibrosis; returns for follow-up in July 2023.  He denies any palpitations, shortness of breath, or peripheral edema. No lightheadedness, headaches, syncope, orthopnea, or PND.   Past Medical History:  Diagnosis Date   Anemia of chronic disease    Atherosclerosis of both carotid  arteries    Atypical mole 03/28/2013   moderate atypia on mid back   Barrett's esophagus    Basal cell carcinoma 12/17/2015   superficial on right postauricular - CX3+5FU   Dizziness    GERD (gastroesophageal reflux disease)    Hx of adenomatous colonic polyps    Hyperlipidemia    Macrocytosis without anemia    Obesity    OSA (obstructive sleep apnea)    Rheumatoid arthritis(714.0)    Squamous cell carcinoma of skin 02/25/2010   in situ on left sideburn, inferior - CX3+5FU   Squamous cell carcinoma of skin 02/25/2010   well differentiated on right sideburn - CX3+5FU   Squamous cell carcinoma of skin 03/24/2011   in situ on right temple - CX3+5FU   Squamous cell carcinoma of skin 03/24/2011   in situ on right jawline - CX3+5FU   Squamous cell carcinoma of skin 03/24/2011   in situ on right angle of jaw - CX3+5FU   Squamous cell carcinoma of skin 05/05/2011   left temple - CX3+5FU   Squamous cell carcinoma of skin 11/28/2013   in situ on mid scalp - tx p bx   Squamous cell carcinoma of skin 12/17/2015   in situ on right post crown - CX3+5FU   Squamous cell carcinoma of skin 12/17/2015   verruca vulgaris with squamous atypia on right thumb - CX3+5FU   Squamous cell carcinoma of skin 01/03/2016   in situ  on right crown post inf   Squamous cell carcinoma of skin 04/22/2016   well differentiated on left outer low leg   Squamous cell carcinoma of skin 10/06/2016   in situ on right subauricular - CX3+Cautery+5FU   Squamous cell carcinoma of skin 12/22/2016   in situ on mid post crown - tx p bx   Squamous cell carcinoma of skin 01/11/2018   in situ on right crown - tx p bx   Squamous cell carcinoma of skin 01/11/2018   in situ on mid crown - tx p bx   Squamous cell carcinoma of skin 01/11/2018   in situ on left crown - tx p bx   Squamous cell carcinoma of skin 01/11/2018   well differentiated on right temple - tx p bx   Squamous cell carcinoma of skin 03/07/2019   well  differentiated on right neck - tx p bx   Squamous cell carcinoma of skin 03/07/2019   in situ on right temple - tx p bx   Squamous cell carcinoma of skin 03/07/2019   in situ on mid forehead - tx p bx   Squamous cell carcinoma of skin 01/04/2020   well diff- mid parietal scalp (CX35FU)   Squamous cell carcinoma of skin 01/04/2020   in situ- right scalp superior (CX35FU)   Squamous cell carcinoma of skin 01/04/2020   in situ-legt parietal scalp (CX35FU)    Syncope     Past Surgical History:  Procedure Laterality Date   CORONARY STENT INTERVENTION N/A 06/24/2018   Procedure: CORONARY STENT INTERVENTION;  Surgeon: Burnell Blanks, MD;  Location: [facility];  Service: Cardiovascular;  Laterality: N/A;   IR IVC FILTER PLMT / S&I /IMG GUID/MOD SED  10/07/2018   IR IVC FILTER RETRIEVAL / S&I /IMG GUID/MOD SED  06/15/2019   IR RADIOLOGIST EVAL & MGMT  09/27/2018   IR RADIOLOGIST EVAL & MGMT  01/04/2019   JOINT REPLACEMENT  2004   left   LEFT HEART CATH AND CORONARY ANGIOGRAPHY N/A 06/24/2018   Procedure: LEFT HEART CATH AND CORONARY ANGIOGRAPHY;  Surgeon: Burnell Blanks, MD;  Location: [facility];  Service: Cardiovascular;  Laterality: N/A;    Current Medications: Current Meds  Medication Sig   atorvastatin (LIPITOR) 40 MG tablet Take 1 tablet (40 mg total) by mouth daily. Please keep upcoming appt. In May with your Cardiologist in order to receive further refills. Thank You.   azaTHIOprine (IMURAN) 50 MG tablet Take 50 mg by mouth 2 (two) times daily.   ELIQUIS 5 MG TABS tablet TAKE 1 TABLET(5 MG) BY MOUTH TWICE DAILY   esomeprazole (NEXIUM) 20 MG capsule Take 20 mg by mouth daily at 12 noon.    folic acid (FOLVITE) 1 MG tablet Take by mouth.   Multiple Vitamins-Minerals (MULTIVITAMIN WITH MINERALS) tablet Take 1 tablet by mouth daily.   mupirocin ointment (BACTROBAN) 2 % Apply to wound qd-bid   Niacinamide-Zn-Cu-Methfo-Se-Cr (NICOTINAMIDE PO) Take 500 mg by  mouth.   predniSONE (DELTASONE) 5 MG tablet 1 tablet     Allergies:   Patient has no known allergies.   Social History   Socioeconomic History   Marital status: Married    Spouse name: Katharine Look   Number of children: 2   Years of education: Not on file   Highest education level: Bachelor's degree (e.g., BA, AB, BS)  Occupational History    Employer: [employer]  Tobacco Use   Smoking status: Never   Smokeless  tobacco: Never  Vaping Use   Vaping Use: Never used  Substance and Sexual Activity   Alcohol use: Yes    Alcohol/week: 1.0 standard drink    Types: 1 Glasses of wine per week    Comment: OCCASION   Drug use: No   Sexual activity: Not on file  Other Topics Concern   Not on file  Social History Narrative   Patient is right-handed.  He lives with his wife in a one level home. He does not exercise.    Social Determinants of Health   Financial Resource Strain: Not on file  Food Insecurity: Not on file  Transportation Needs: Not on file  Physical Activity: Not on file  Stress: Not on file  Social Connections: Not on file     Family History: The patient's family history includes Cancer in his father; Colon cancer in his father; Glaucoma in his mother; Prostate cancer in his father; Stroke in his mother.  ROS:   Please see the history of present illness.  (+) Left abdominal/chest pain All other systems reviewed and are negative.  EKGs/Labs/Other Studies Reviewed:    The following studies were reviewed today:  CT Chest 05/21/2020: FINDINGS: Cardiovascular: Aortic atherosclerosis. Normal heart size. Left coronary artery calcifications and stents. No pericardial effusion.   Mediastinum/Nodes: No enlarged mediastinal, hilar, or axillary lymph nodes. Thyroid gland, trachea, and esophagus demonstrate no significant findings.   Lungs/Pleura: There is moderate pulmonary fibrosis in a pattern with apical to basal gradient featuring irregular peripheral  interstitial opacity, septal thickening, mild traction bronchiectasis, areas of subpleural bronchiolectasis without clear evidence of honeycombing. No significant air trapping on expiratory phase imaging. There multiple small pulmonary nodules in the bilateral lung apices, generally irregular in morphology and measuring up to 5 mm, for example in the left apex (series 10, image 58). No pleural effusion or pneumothorax.   Upper Abdomen: No acute abnormality.   Musculoskeletal: No chest wall mass or suspicious bone lesions identified.   IMPRESSION: 1. There is moderate pulmonary fibrosis in a pattern with apical to basal gradient featuring irregular peripheral interstitial opacity, septal thickening, mild traction bronchiectasis, and areas of subpleural bronchiolectasis without clear evidence of honeycombing. Findings are consistent with a "probable UIP" pattern and generally in keeping with rheumatoid associated interstitial lung disease. Findings are categorized as probable UIP per consensus guidelines: Diagnosis of Idiopathic Pulmonary Fibrosis: An Official ATS/ERS/JRS/ALAT Clinical Practice Guideline. [place], Iss 5, [phone], Oct 31 2016. 2. There multiple small nonspecific pulmonary nodules in the bilateral lung apices, generally irregular in morphology and measuring 5 mm and smaller. No follow-up needed if patient is low-risk (and has no known or suspected primary neoplasm). Non-contrast chest CT can be considered in 12 months if patient is high-risk. This recommendation follows the consensus statement: Guidelines for Management of Incidental Pulmonary Nodules Detected on CT Images: From the Fleischner Society 2017; Radiology 2017; 284:228-243. 3. Coronary artery disease.   Aortic Atherosclerosis (ICD10-I70.0).  Monitor 07/2018: Predominant rhythm is sinus with occasional PVCs No atrial fibrillation detected, no adverse pauses There was a 4 beat  and an 8 beat run of nonsustained ventricular tachycardia, asymptomatic. This occurred at 4:40 AM   Brief run of nonsustained ventricular tachycardia.  Ejection fraction is normal.  Recent successful stent placement to the LAD.  Continue with aggressive secondary prevention including beta-blocker.  Left Heart Cath  06/24/2018: Mid RCA lesion is 20% stenosed. Prox Cx to Mid Cx lesion is 20% stenosed.  Prox LAD lesion is 99% stenosed. A drug-eluting stent was successfully placed using a STENT RESOLUTE ONYX 3.0X18. Post intervention, there is a 0% residual stenosis. Ost 1st Diag lesion is 80% stenosed.   1. Severe stenosis proximal to mid LAD 2. Successful PTCA/DES x 1 proximal LAD 3. Mild non-obstructive disease in the Circumflex and RCA   Recommendations: DAPT for ASA/Brilinta for one year. Continue high intensity statin. Add beta blocker as heart rate and BP tolerates. Likely d/c home tomorrow.   Diagnostic: Dominance: Right   Intervention:    ECHO 06/2018 1. The left ventricle has normal systolic function, with an ejection  fraction of 55-60%. The cavity size was normal. Left ventricular diastolic  Doppler parameters are indeterminate. No evidence of left ventricular  regional wall motion abnormalities.  2. The right ventricle has normal systolic function. The cavity was  mildly enlarged. There is no increase in right ventricular wall thickness.  Right ventricular systolic pressure moderate to severely elevated with an  estimated pressure of 51.7 mmHg.  3. The aortic valve is tricuspid. Aortic valve regurgitation is trivial  by color flow Doppler.  4. The inferior vena cava was dilated in size with <50% respiratory  variability.    EKG:  EKG is personally reviewed. 07/17/2021:  Sinus rhythm. Rate 64 bpm. RBBB. 07/08/20-EKG was not ordered today.  Recent Labs: No results found for requested labs within last 8760 hours.   Recent Lipid Panel    Component Value Date/Time    CHOL 100 06/13/2019 1055   TRIG 161 (H) 06/13/2019 1055   HDL 41 06/13/2019 1055   CHOLHDL 2.4 06/13/2019 1055   CHOLHDL 3.4 06/23/2018 1933   VLDL 7 06/23/2018 1933   LDLCALC 32 06/13/2019 1055     Physical Exam:    VS:  BP 120/80 (BP Location: Left Arm, Patient Position: Sitting, Cuff Size: Normal)   Pulse 64   Ht '5\' 8"'$  (1.727 m)   Wt 183 lb (83 kg)   BMI 27.83 kg/m     Wt Readings from Last 3 Encounters:  07/17/21 183 lb (83 kg)  06/16/21 191 lb 3.2 oz (86.7 kg)  03/27/21 196 lb (88.9 kg)     GEN: Well nourished, well developed in no acute distress HEENT: Normal NECK: No JVD; No carotid bruits LYMPHATICS: No lymphadenopathy CARDIAC: RRR, no murmurs, rubs, gallops RESPIRATORY:  Crackles without rales, wheezing or rhonchi  ABDOMEN: Soft, non-tender, non-distended MUSCULOSKELETAL:  No edema; No deformity  SKIN: Warm and dry NEUROLOGIC:  Alert and oriented x 3 PSYCHIATRIC:  Normal affect   ASSESSMENT:    1. Coronary artery disease involving native coronary artery of native heart without angina pectoris   2. Bilateral pulmonary embolism (HCC)   3. Coagulation disorder ([place])   4. Pure hypercholesterolemia   5. Pulmonary fibrosis ([place])   6. DVT, lower extremity, distal, acute, left ([place])   7. Right bundle branch block     PLAN:    In order of problems listed above:  Bilateral pulmonary embolism (HCC) Continue with lifelong Eliquis 5 mg twice a day.  Hematology oncology, Dr. Benay Spice.  Notes reviewed.  Hemoglobin 12.7, creatinine 1.0.  Coronary artery disease involving native coronary artery of native heart without angina pectoris LAD stent 2020.  Continue with Eliquis.  He is not on aspirin because of this.  Stable, no anginal symptoms.  On atorvastatin 40 mg.  Excellent.  LDL 51.  Coagulation disorder ([place]) Continue with Eliquis.  HLD (hyperlipidemia) Continue with atorvastatin 40  mg no myalgias.  LDL at goal at 51.  Pulmonary fibrosis (HCC) Currently on  study drug with Dr. Chase Caller.  Subtle crackles heard on exam.  DVT, lower extremity, distal, acute, left (HCC) Prior IVC filter has been removed.  Continue with Eliquis  Right bundle branch block Stable, no changes.   Follow up in 1 year  Medication Adjustments/Labs and Tests Ordered: Current medicines are reviewed at length with the patient today.  Concerns regarding medicines are outlined above.   Orders Placed This Encounter  Procedures   EKG 12-Lead   No orders of the defined types were placed in this encounter.   Patient Instructions  Medication Instructions:  The current medical regimen is effective;  continue present plan and medications.  *If you need a refill on your cardiac medications before your next appointment, please call your pharmacy*  Follow-Up: At [facility], you and your health needs are our priority.  As part of our continuing mission to provide you with exceptional heart care, we have created designated Provider Care Teams.  These Care Teams include your primary Cardiologist (physician) and Advanced Practice Providers (APPs -  Physician Assistants and Nurse Practitioners) who all work together to provide you with the care you need, when you need it.  We recommend signing up for the patient portal called "MyChart".  Sign up information is provided on this After Visit Summary.  MyChart is used to connect with patients for Virtual Visits (Telemedicine).  Patients are able to view lab/test results, encounter notes, upcoming appointments, etc.  Non-urgent messages can be sent to your provider as well.   To learn more about what you can do with MyChart, go to [email or web address].    Your next appointment:   1 year(s)  The format for your next appointment:   In Person  Provider:   Candee Furbish, MD {    Important Information About Sugar         I,Mathew Stumpf,acting as a scribe for Candee Furbish, MD.,have documented all relevant documentation on  the behalf of Candee Furbish, MD,as directed by  Candee Furbish, MD while in the presence of Candee Furbish, MD.  I, Candee Furbish, MD, have reviewed all documentation for this visit. The documentation on 07/17/21 for the exam, diagnosis, procedures, and orders are all accurate and complete.  Signed, Candee Furbish, MD  07/17/2021 10:58 AM    [facility]

## 2021-07-18 ENCOUNTER — Telehealth: Payer: Self-pay

## 2021-07-18 DIAGNOSIS — R29898 Other symptoms and signs involving the musculoskeletal system: Secondary | ICD-10-CM

## 2021-07-18 NOTE — Telephone Encounter (Signed)
-----   Message from Pieter Partridge, DO sent at 07/07/2021  7:26 AM EDT ----- The MRI doesn't show any spinal stenosis.  There is foraminal stenosis that may be pinching nerves that could be a cause for weakness.  I would like to get a NCV-EMG of the upper extremities for further evaluation.  We can send to [facility].

## 2021-07-18 NOTE — Telephone Encounter (Signed)
ERROR

## 2021-07-18 NOTE — Telephone Encounter (Signed)
Pt called in ans was given his MRI results that stated MRI doesn't show any spinal stenosis.  There is foraminal stenosis that may be pinching nerves that could be a cause for weakness.  Dr Tomi Likens would like  would like to get a NCV-EMG of the upper extremities for further evaluation.  We can send to [facility]. . Pt stated he would like to do the EMG for more answer. Will place order in Epic

## 2021-07-21 DIAGNOSIS — R109 Unspecified abdominal pain: Secondary | ICD-10-CM | POA: Diagnosis not present

## 2021-07-21 DIAGNOSIS — R197 Diarrhea, unspecified: Secondary | ICD-10-CM | POA: Diagnosis not present

## 2021-07-25 DIAGNOSIS — Z029 Encounter for administrative examinations, unspecified: Secondary | ICD-10-CM | POA: Diagnosis not present

## 2021-07-25 DIAGNOSIS — H6121 Impacted cerumen, right ear: Secondary | ICD-10-CM | POA: Diagnosis not present

## 2021-07-25 DIAGNOSIS — I2510 Atherosclerotic heart disease of native coronary artery without angina pectoris: Secondary | ICD-10-CM | POA: Diagnosis not present

## 2021-07-25 DIAGNOSIS — Z23 Encounter for immunization: Secondary | ICD-10-CM | POA: Diagnosis not present

## 2021-08-21 DIAGNOSIS — G5603 Carpal tunnel syndrome, bilateral upper limbs: Secondary | ICD-10-CM | POA: Diagnosis not present

## 2021-08-21 DIAGNOSIS — G609 Hereditary and idiopathic neuropathy, unspecified: Secondary | ICD-10-CM | POA: Diagnosis not present

## 2021-08-25 ENCOUNTER — Other Ambulatory Visit: Payer: Self-pay | Admitting: Oncology

## 2021-08-29 ENCOUNTER — Ambulatory Visit (HOSPITAL_BASED_OUTPATIENT_CLINIC_OR_DEPARTMENT_OTHER)
Admission: RE | Admit: 2021-08-29 | Discharge: 2021-08-29 | Disposition: A | Discharge status: Home / Self Care | Payer: Medicare PPO | Source: Ambulatory Visit | Attending: Internal Medicine | Admitting: Internal Medicine

## 2021-08-29 DIAGNOSIS — R918 Other nonspecific abnormal finding of lung field: Secondary | ICD-10-CM | POA: Insufficient documentation

## 2021-08-29 DIAGNOSIS — I2510 Atherosclerotic heart disease of native coronary artery without angina pectoris: Secondary | ICD-10-CM | POA: Diagnosis not present

## 2021-08-29 DIAGNOSIS — I700 Atherosclerosis of aorta: Secondary | ICD-10-CM | POA: Insufficient documentation

## 2021-08-29 DIAGNOSIS — J849 Interstitial pulmonary disease, unspecified: Secondary | ICD-10-CM | POA: Diagnosis not present

## 2021-09-01 ENCOUNTER — Ambulatory Visit: Payer: Medicare PPO | Admitting: Internal Medicine

## 2021-09-04 DIAGNOSIS — R0602 Shortness of breath: Secondary | ICD-10-CM | POA: Diagnosis not present

## 2021-09-04 DIAGNOSIS — M810 Age-related osteoporosis without current pathological fracture: Secondary | ICD-10-CM | POA: Diagnosis not present

## 2021-09-04 DIAGNOSIS — M62838 Other muscle spasm: Secondary | ICD-10-CM | POA: Diagnosis not present

## 2021-09-04 DIAGNOSIS — M8580 Other specified disorders of bone density and structure, unspecified site: Secondary | ICD-10-CM | POA: Diagnosis not present

## 2021-09-04 DIAGNOSIS — M150 Primary generalized (osteo)arthritis: Secondary | ICD-10-CM | POA: Diagnosis not present

## 2021-09-04 DIAGNOSIS — I2510 Atherosclerotic heart disease of native coronary artery without angina pectoris: Secondary | ICD-10-CM | POA: Diagnosis not present

## 2021-09-04 DIAGNOSIS — E559 Vitamin D deficiency, unspecified: Secondary | ICD-10-CM | POA: Diagnosis not present

## 2021-09-04 DIAGNOSIS — M0589 Other rheumatoid arthritis with rheumatoid factor of multiple sites: Secondary | ICD-10-CM | POA: Diagnosis not present

## 2021-09-04 DIAGNOSIS — Z79899 Other long term (current) drug therapy: Secondary | ICD-10-CM | POA: Diagnosis not present

## 2021-09-04 NOTE — Progress Notes (Signed)
NEUROLOGY FOLLOW UP OFFICE NOTE  Nicholas Webb [id number]  Assessment/Plan:   Bilateral carpal tunnel syndrome (severe on right) Bilateral non-localizable ulnar neuropathy of unknown clinical significance Cervical spondylosis Rheumatoid arthritis I think that the pain is mostly related to his RA.  He doesn't describe pattern of pain or numbness consistent with carpal tunnel syndrome but he clearly is dropping objects and has atrophy of the first two lumbricals consistent with median nerve involvement.  I will refer him to the hand specialist for further evaluation and consideration of treatment regarding carpal tunnel syndrome Follow up if alternative diagnoses are concerning.   Given associated balance problems, will check MRI of cervical spine to evaluate for central canal and neural foraminal stenosis.  Based on results, consider NCV-EMG upper extremities.    Subjective:  Nicholas Webb is a 81 year old right-handed male with rheumatoid arthritis, obstructive sleep apnea, hyperlipidemia, vestibular schwannoma and carotid artery disease who follows up for bilateral hand weakness.  He is accompanied by his daughter who supplements history.   UPDATE: MRI of cervical spine on 07/04/2021 personally reviewed revealed multilevel spondylosis with no more than moderate-severe left and moderate right foraminal stenosis and mild spinal stenosis at C5-6.  He had NCV-EMG of upper extremities on 08/21/2021 which revealed evidence of severe carpal tunnel syndrome on the right, right ulnar neuropathy on the right (not localized at the elbow), moderate left carpal tunnel syndrome and left ulnar demyelinating neuropathy.    HISTORY: He has rheumatoid arthritis.  He reports feeling weaker.  He is dropping objects.   Sometimes his hands contract.  Mostly his pain involves the wrists with associated swelling.  Right hand is worse.  No numbness into the fingers.  Has some mild neck pain but denies  proximal arm weakness, leg weakness, upper extremity radicular pain.  Only time he notes numbness is in his left hand if he is sitting up.  Notes balance problems.  His rheumatologist noted muscle wasting in the hands and thought he needed a NCV-EMG.  PAST MEDICAL HISTORY: Past Medical History:  Diagnosis Date   Anemia of chronic disease    Atherosclerosis of both carotid arteries    Atypical mole 03/28/2013   moderate atypia on mid back   Barrett's esophagus    Basal cell carcinoma 12/17/2015   superficial on right postauricular - CX3+5FU   Dizziness    GERD (gastroesophageal reflux disease)    Hx of adenomatous colonic polyps    Hyperlipidemia    Macrocytosis without anemia    Obesity    OSA (obstructive sleep apnea)    Rheumatoid arthritis(714.0)    Squamous cell carcinoma of skin 02/25/2010   in situ on left sideburn, inferior - CX3+5FU   Squamous cell carcinoma of skin 02/25/2010   well differentiated on right sideburn - CX3+5FU   Squamous cell carcinoma of skin 03/24/2011   in situ on right temple - CX3+5FU   Squamous cell carcinoma of skin 03/24/2011   in situ on right jawline - CX3+5FU   Squamous cell carcinoma of skin 03/24/2011   in situ on right angle of jaw - CX3+5FU   Squamous cell carcinoma of skin 05/05/2011   left temple - CX3+5FU   Squamous cell carcinoma of skin 11/28/2013   in situ on mid scalp - tx p bx   Squamous cell carcinoma of skin 12/17/2015   in situ on right post crown - CX3+5FU   Squamous cell carcinoma of skin 12/17/2015   verruca vulgaris with squamous  atypia on right thumb - CX3+5FU   Squamous cell carcinoma of skin 01/03/2016   in situ on right crown post inf   Squamous cell carcinoma of skin 04/22/2016   well differentiated on left outer low leg   Squamous cell carcinoma of skin 10/06/2016   in situ on right subauricular - CX3+Cautery+5FU   Squamous cell carcinoma of skin 12/22/2016   in situ on mid post crown - tx p bx   Squamous cell  carcinoma of skin 01/11/2018   in situ on right crown - tx p bx   Squamous cell carcinoma of skin 01/11/2018   in situ on mid crown - tx p bx   Squamous cell carcinoma of skin 01/11/2018   in situ on left crown - tx p bx   Squamous cell carcinoma of skin 01/11/2018   well differentiated on right temple - tx p bx   Squamous cell carcinoma of skin 03/07/2019   well differentiated on right neck - tx p bx   Squamous cell carcinoma of skin 03/07/2019   in situ on right temple - tx p bx   Squamous cell carcinoma of skin 03/07/2019   in situ on mid forehead - tx p bx   Squamous cell carcinoma of skin 01/04/2020   well diff- mid parietal scalp (CX35FU)   Squamous cell carcinoma of skin 01/04/2020   in situ- right scalp superior (CX35FU)   Squamous cell carcinoma of skin 01/04/2020   in situ-legt parietal scalp (CX35FU)    Syncope     MEDICATIONS: Current Outpatient Medications on File Prior to Visit  Medication Sig Dispense Refill   atorvastatin (LIPITOR) 40 MG tablet Take 1 tablet (40 mg total) by mouth daily. Please keep upcoming appt. In May with your Cardiologist in order to receive further refills. Thank You. 90 tablet 0   azaTHIOprine (IMURAN) 50 MG tablet Take 50 mg by mouth 2 (two) times daily.     ELIQUIS 5 MG TABS tablet TAKE 1 TABLET(5 MG) BY MOUTH TWICE DAILY 60 tablet 5   esomeprazole (NEXIUM) 20 MG capsule Take 20 mg by mouth daily at 12 noon.      folic acid (FOLVITE) 1 MG tablet Take by mouth.     Multiple Vitamins-Minerals (MULTIVITAMIN WITH MINERALS) tablet Take 1 tablet by mouth daily.     mupirocin ointment (BACTROBAN) 2 % Apply to wound qd-bid 22 g 4   Niacinamide-Zn-Cu-Methfo-Se-Cr (NICOTINAMIDE PO) Take 500 mg by mouth.     predniSONE (DELTASONE) 5 MG tablet 1 tablet     No current facility-administered medications on file prior to visit.    ALLERGIES: No Known Allergies  FAMILY HISTORY: Family History  Problem Relation Age of Onset   Stroke Mother     Glaucoma Mother    Cancer Father    Prostate cancer Father    Colon cancer Father       Objective:  Blood pressure 108/69, pulse 70, height '5\' 9"'$  (1.753 m), weight 185 lb (83.9 kg), SpO2 94 %. General: No acute distress.  Patient appears well-groomed.   Head:  Normocephalic/atraumatic Eyes:  Fundi examined but not visualized Neck: supple, no paraspinal tenderness, full range of motion Neurological Exam: alert and oriented to person, place, and time.  Speech fluent and not dysarthric, language intact.  5-/5 APB bilaterally, atrophy of first 2 lumbricals.     Metta Clines, DO  CC: Windy Carina, FNP

## 2021-09-05 ENCOUNTER — Ambulatory Visit: Payer: Medicare PPO | Admitting: Neurology

## 2021-09-05 ENCOUNTER — Encounter: Payer: Self-pay | Admitting: Neurology

## 2021-09-05 VITALS — BP 108/69 | HR 70 | Ht 69.0 in | Wt 185.0 lb

## 2021-09-05 DIAGNOSIS — G5623 Lesion of ulnar nerve, bilateral upper limbs: Secondary | ICD-10-CM | POA: Diagnosis not present

## 2021-09-05 DIAGNOSIS — G5603 Carpal tunnel syndrome, bilateral upper limbs: Secondary | ICD-10-CM

## 2021-09-05 DIAGNOSIS — R29898 Other symptoms and signs involving the musculoskeletal system: Secondary | ICD-10-CM | POA: Diagnosis not present

## 2021-09-05 NOTE — Patient Instructions (Addendum)
I think most of the pain is related to arthritis.  Because you have some muscle wasting and weakness that may be due to carpal tunnel syndrome (which is in both hands but severe on right), I will send you to a hand specialist for further evaluation and consideration of treatment.  You have evidence of ulnar neuropathy in both arms which are not localizable but I do not think that is the cause of your symptoms.  If there is concern for something else moving forward, please follow up.  [place] [address] [place], [place] [address]  Contact us   [phone]  [phone])

## 2021-09-12 ENCOUNTER — Other Ambulatory Visit: Payer: Self-pay | Admitting: Cardiology

## 2021-09-12 DIAGNOSIS — I2510 Atherosclerotic heart disease of native coronary artery without angina pectoris: Secondary | ICD-10-CM

## 2021-09-19 ENCOUNTER — Ambulatory Visit: Payer: Medicare PPO | Admitting: Internal Medicine

## 2021-09-25 ENCOUNTER — Telehealth: Payer: Self-pay | Admitting: Neurology

## 2021-09-25 NOTE — Telephone Encounter (Signed)
Resent referral after calling they had not received

## 2021-09-25 NOTE — Telephone Encounter (Signed)
Patient would like to know where he stands as far as the Copy. He hasn't heard from anyone

## 2021-09-25 NOTE — Telephone Encounter (Signed)
Called patient and let him know I have resent the referral to [facility]

## 2021-09-29 ENCOUNTER — Telehealth: Payer: Self-pay

## 2021-09-29 NOTE — Telephone Encounter (Signed)
Nicholas Webb called from [facility] for his carpal tunnel surgery. I have reached out to [facility] and left voicemail we need complete results from the EM[facility] including the numbers registered. Patient has been scheduled and they are just requiring those results. Fax to [phone] as soon as we receive them.

## 2021-09-30 NOTE — Telephone Encounter (Signed)
Records received in media printed and sent to [facility]

## 2021-10-08 ENCOUNTER — Telehealth: Payer: Self-pay | Admitting: Internal Medicine

## 2021-10-09 DIAGNOSIS — M810 Age-related osteoporosis without current pathological fracture: Secondary | ICD-10-CM | POA: Diagnosis not present

## 2021-10-09 NOTE — Telephone Encounter (Signed)
Called and spoke with pt who states he received a text from [facility] for research applications and wanted to know if that was legit. Stated to pt that I did not see where we had referred him to [facility] so he said he would disregard it. Pt has an upcoming appt with MR 8/14. Nothing further needed.

## 2021-10-13 ENCOUNTER — Encounter: Payer: Self-pay | Admitting: Internal Medicine

## 2021-10-13 ENCOUNTER — Ambulatory Visit (INDEPENDENT_AMBULATORY_CARE_PROVIDER_SITE_OTHER): Payer: Medicare PPO | Admitting: Internal Medicine

## 2021-10-13 ENCOUNTER — Encounter: Payer: Medicare PPO | Admitting: Internal Medicine

## 2021-10-13 ENCOUNTER — Ambulatory Visit: Payer: Medicare PPO | Admitting: Internal Medicine

## 2021-10-13 VITALS — BP 124/70 | HR 64 | Temp 98.1°F | Ht 68.0 in | Wt 187.2 lb

## 2021-10-13 DIAGNOSIS — J849 Interstitial pulmonary disease, unspecified: Secondary | ICD-10-CM

## 2021-10-13 DIAGNOSIS — Z006 Encounter for examination for normal comparison and control in clinical research program: Secondary | ICD-10-CM

## 2021-10-13 DIAGNOSIS — Z8739 Personal history of other diseases of the musculoskeletal system and connective tissue: Secondary | ICD-10-CM

## 2021-10-13 DIAGNOSIS — R918 Other nonspecific abnormal finding of lung field: Secondary | ICD-10-CM | POA: Diagnosis not present

## 2021-10-13 LAB — PULMONARY FUNCTION TEST
DL/VA % pred: 78 %
DL/VA: 3.09 ml/min/mmHg/L
DLCO cor % pred: 69 %
DLCO cor: 15.96 ml/min/mmHg
DLCO unc % pred: 69 %
DLCO unc: 15.96 ml/min/mmHg
FEF 25-75 Pre: 3.21 L/sec
FEF2575-%Pred-Pre: 179 %
FEV1-%Pred-Pre: 113 %
FEV1-Pre: 2.98 L
FEV1FVC-%Pred-Pre: 114 %
FEV6-%Pred-Pre: 105 %
FEV6-Pre: 3.64 L
FEV6FVC-%Pred-Pre: 107 %
FVC-%Pred-Pre: 98 %
FVC-Pre: 3.64 L
Pre FEV1/FVC ratio: 82 %
Pre FEV6/FVC Ratio: 100 %

## 2021-10-13 NOTE — Patient Instructions (Signed)
Spirometry and DLCO Performed Today.  

## 2021-10-13 NOTE — Patient Instructions (Addendum)
ICD-10-CM   1. Interstitial lung disease due to connective tissue disease ([place])  J84.89    M35.9   2. ILD (interstitial lung disease) ([place])  J84.9 Pulmonary function test    Pulse oximetry, overnight    CT CHEST HIGH RESOLUTION  3. History of rheumatoid arthritis  Z87.39   4. 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 However, stable son PFT betwee Nov 2022 - > Aug 2023   - I think immuran is helping control the ILD  Multiple small lung nodules probably due to rheumatoid arthritis (March 2022 last CT)]  - no change Aug 2023 and considered benign  Plan -\continue immuran via Dr Dossie Der - Hold off on starting anti-fibrotic ofev/esbriet  - based on shared decision making -  Appreciate  ILD PRO registry visit -Do spirometry and DLCO in 6 months - Vaccines  - RSV, flu and covid boosters in fall 2023  Follow-up - Return in 6 months -but after breathing test   -Symptom score and walking desaturation test at follow-up  - [place] for visitor to come with you to exam room

## 2021-10-13 NOTE — Progress Notes (Signed)
OV 07/17/2020  Subjective:  Patient ID: Nicholas Webb, male , DOB: 21-Jan-1941 , age 81 y.o. , MRN: [id number] , ADDRESS: [address] [place] [address] PCP Maurice Small, MD Patient Care Team: Maurice Small, MD as PCP - General (Family Medicine) Jerline Pain, MD as PCP - Cardiology (Cardiology) Warren Danes, PA-C as Physician Assistant (Dermatology) [name], MD as Consulting Physician (Dermatology)  This Provider for this visit: Treatment Team:  Attending Provider: Brand Males, MD    07/17/2020 -   Chief Complaint  Patient presents with   Follow-up    PFT performed 06/17/20. Pt states he is about the same since last visit.   Rheumatoid arthritis with interstitial lung disease History of pulmonary embolism on Eliquis - since April 2020 History of coronary artery disease follows Dr. Marlou Porch  HPI Nicholas Webb 81 y.o. -presents for follow-up.  He is accompanied by his daughter.  This visit is to focus on discussing the results.  His symptoms are extremely mild from a respiratory standpoint documented below.  His high-resolution CT chest shows interstitial lung disease probable UIP pattern.  He also has multiple small pulmonary nodules.  There is coronary artery calcification [he is known to have coronary artery disease and follows Dr. Marlou Porch.  His pulmonary function test shows very mild restriction from his ILD.  His walking desaturation test is essentially close to normal except for tachycardia.  Taken together the burden of ILD is mild.  His daughter wants to know how to approach his ILD.  They report that he was on methotrexate for many years [greater than 10].  There was wheezing and that is why he was referred to [place].  He is currently off methotrexate.  He started working out at [employer] well     HRCT Chest data march 2022 CLINICAL DATA:  Interstitial lung disease, history of rheumatoid arthritis   EXAM: CT CHEST WITHOUT CONTRAST    TECHNIQUE: Multidetector CT imaging of the chest was performed following the standard protocol without intravenous contrast. High resolution imaging of the lungs, as well as inspiratory and expiratory imaging, was performed.   COMPARISON:  None.   FINDINGS: Cardiovascular: Aortic atherosclerosis. Normal heart size. Left coronary artery calcifications and stents. No pericardial effusion.   Mediastinum/Nodes: No enlarged mediastinal, hilar, or axillary lymph nodes. Thyroid gland, trachea, and esophagus demonstrate no significant findings.   Lungs/Pleura: There is moderate pulmonary fibrosis in a pattern with apical to basal gradient featuring irregular peripheral interstitial opacity, septal thickening, mild traction bronchiectasis, areas of subpleural bronchiolectasis without clear evidence of honeycombing. No significant air trapping on expiratory phase imaging. There multiple small pulmonary nodules in the bilateral lung apices, generally irregular in morphology and measuring up to 5 mm, for example in the left apex (series 10, image 58). No pleural effusion or pneumothorax.   Upper Abdomen: No acute abnormality.   Musculoskeletal: No chest wall mass or suspicious bone lesions identified.  IMPRESSION: 1. There is moderate pulmonary fibrosis in a pattern with apical to basal gradient featuring irregular peripheral interstitial opacity, septal thickening, mild traction bronchiectasis, and areas of subpleural bronchiolectasis without clear evidence of honeycombing. Findings are consistent with a "probable UIP" pattern and generally in keeping with rheumatoid associated interstitial lung disease. Findings are categorized as probable UIP per consensus guidelines: Diagnosis of Idiopathic Pulmonary Fibrosis: An Official ATS/ERS/JRS/ALAT Clinical Practice Guideline. [place], Iss 5, [phone], Oct 31 2016. 2. There multiple small nonspecific  pulmonary  nodules in the bilateral lung apices, generally irregular in morphology and measuring 5 mm and smaller. No follow-up needed if patient is low-risk (and has no known or suspected primary neoplasm). Non-contrast chest CT can be considered in 12 months if patient is high-risk. This recommendation follows the consensus statement: Guidelines for Management of Incidental Pulmonary Nodules Detected on CT Images: From the Fleischner Society 2017; Radiology 2017; 284:228-243. 3. Coronary artery disease.   Aortic Atherosclerosis (ICD10-I70.0).     Electronically Signed   By: Eddie Candle M.D.   On: 05/22/2020 11:58      OV 01/16/2021  Subjective:  Patient ID: Nicholas Webb, male , DOB: [birth year] , age 81 y.o. , MRN: [id number] , ADDRESS: [address] [place] [address] PCP Maurice Small, MD (Inactive) Patient Care Team: Maurice Small, MD (Inactive) as PCP - General (Family Medicine) Jerline Pain, MD as PCP - Cardiology (Cardiology) Starlyn Skeans as Physician Assistant (Dermatology) [name], MD as Consulting Physician (Dermatology)  This Provider for this visit: Treatment Team:  Attending Provider: Brand Males, MD   Rheumatoid arthritis with interstitial lung disease  -Started on Imuran by rheumatology between May 2022 and November 2022  -Last HRCT March 2022 History of pulmonary embolism on Eliquis - since April 2020 History of coronary artery disease follows Dr. Marlou Porch Pulmonary nodules associated with RA ILD  01/16/2021 -   Chief Complaint  Patient presents with   Follow-up    No new concerns, pft done today     HPI Nicholas Webb 81 y.o. -presents for routine follow-up.  He feels stable.  His dyspnea score is all altered.  It is documented below.  His walking desaturation test simple is also unchanged.  He feels great.  In the interim his rheumatologist started him on Imuran for his rheumatoid arthritis pain.  He feels good overall.   However his pulmonary function test shows 3-4% decline in FVC and a 16% decline in DLCO.  He is not feeling this.  We discussed about adding antifibrotic to his Imuran with the understanding that the Imuran itself might be having a antifibrotic effect.  He does not want to start this right now.     CT Chest data  No results found.  OV 03/27/2021  Subjective:  Patient ID: Nicholas Webb, male , DOB: 12/18/1940 , age 19 y.o. , MRN: [id number] , ADDRESS: [address] [place] [address] PCP Maurice Small, MD Patient Care Team: Maurice Small, MD as PCP - General (Family Medicine) Jerline Pain, MD as PCP - Cardiology (Cardiology) Warren Danes, PA-C as Physician Assistant (Dermatology) [name], MD as Consulting Physician (Dermatology)  This Provider for this visit: Treatment Team:  Attending Provider: Brand Males, MD    03/27/2021 -   Chief Complaint  Patient presents with   Follow-up    10 week follow up with pft. Pt states that breathing is the same    Rheumatoid arthritis with interstitial lung disease  -Started on Imuran by rheumatology between May 2022 and November 2022  -Last HRCT March 2022 History of pulmonary embolism on Eliquis - since April 2020 History of coronary artery disease follows Dr. Marlou Porch Pulmonary nodules associated with RA ILD - 2m UL in march 2022  HPI Nicholas BURDIN89y.o. -returns for follow-up.  He presents with his daughter LJodi Mourningwho is nurse practitioner [facility]  [facility]  His daughter tried to encourage him in my presence.  I also encouraged him.  However he is not that motivated.  He continues on Imuran.  He feels his breathing is the same.  His symptom score shows possibly symptoms are slightly worse but he feels the same.  We did pulmonary function test today and compared to November 2022 it is stable but compared to spring of last year the DLCO has  declined.  This decline was apparent between November 2022 in April 2022.  Since starting Imuran lung function is stable.  Overall will deem this is stability.  He is interested in participating in research protocol.  In terms of lung nodules are very small.  Very likely due to rheumatoid.  Last CT scan was in March 2022.  1 year CT scan will be the spring 2023 or summer 2023.       OV 10/13/2021  Subjective:  Patient ID: Nicholas Webb, male , DOB: 03-02-1941 , age 62 y.o. , MRN: [id number] , ADDRESS: [address] [place] [facility] [address] PCP Saintclair Halsted, FNP Patient Care Team: Saintclair Halsted, FNP as PCP - General (Family Medicine) Jerline Pain, MD as PCP - Cardiology (Cardiology) Warren Danes, PA-C as Physician Assistant (Dermatology) [name], MD as Consulting Physician (Dermatology)  This Provider for this visit: Treatment Team:  Attending Provider: Brand Males, MD    10/13/2021 -   Chief Complaint  Patient presents with   Follow-up    PFT performed today.  Pt states he has been doing okay since last visit and denies any complaints.     Rheumatoid arthritis with interstitial lung disease  -Started on Imuran by rheumatology between May 2022 and November 2022  -Last HRCT March 2022 -> June 2023 with progression  - PFT progressed April 2022 - Nov 2022 -> immuran Rx -> stable Aug 2023  - ILD PRO participant History of pulmonary embolism on Eliquis - since April 2020 History of coronary artery disease follows Dr. Marlou Porch  -s/p stent April 2020 Pulmonary nodules associated with RA ILD - 69m UL in march 2022 and no change Jun 2023  HPI Nicholas [age]FINO89y.o. -returns for follow-up.  He is done his ILD-Pro registry visit today.  He feels stable.  Symptom scores are mild.  Walking desaturation test is stable.  He had pulmonary function test and it is stable compared to November 2022.  November 2022 is when he started his Imuran.  Since then his  pulmonary function test is stable although prior to that his pulmonary function test and his CT scan done this summer compared to pre-Imuran show progression.  Overall he is stable.  Currently is not interested in antifibrotic's.     SYMPTOM SCALE - ILD 07/17/2020  03/27/2021   10/13/2021   O2 use ra ra ra  Shortness of Breath 0 -> 5 scale with 5 being worst (score 6 If unable to do)    At rest 0 0 1  Simple tasks - showers, clothes change, eating, shaving 0 0 1  Household (dishes, doing bed, laundry) 1 2 0  Shopping 0 0 0  Walking level at own pace 0 2 0  Walking up Stairs '1 2 1  '$ Total (30-36) Dyspnea Score '2 6 3  '$ How bad is your cough? minimal 1 0  How bad is your fatigue 0 0 1  How bad is nausea 0 0 0  How bad is vomiting?  0 0 0  How bad is diarrhea? 0 0 00  How bad is anxiety? 00 2 0  How bad is depression 0 0 0       Simple office walk 185 feet x  3 laps goal with forehead probe 07/17/2020  01/16/2021  03/27/2021  10/13/2021   O2 used ra ra Ra ra3  Number laps completed 3 3    Comments about pace avg avg    Resting Pulse Ox/HR 98% and 74/min 97% and 71/min 99% and 67 98% and HR 64  Final Pulse Ox/HR 96% and 108/min 96% and HR 106 95% and 106 96% and HR 99  Desaturated </= 88% no no no no  Desaturated <= 3% points no no yes no  Got Tachycardic >/= 90/min yes yes yes yes  Symptoms at end of test none none none No complaints  Miscellaneous comments x No change x x    PFT     Latest Ref Rng & Units 10/13/2021    2:50 PM 03/27/2021    9:38 AM 01/16/2021    9:36 AM 06/17/2020    9:31 AM  PFT Results  FVC-Pre L 3.64  P 3.61  3.56  3.71   FVC-Predicted Pre % 98  P 96  95  98   FVC-Post L    3.75   FVC-Predicted Post %    99   Pre FEV1/FVC % % 82  P 82  81  81   Post FEV1/FCV % %    84   FEV1-Pre L 2.98  P 2.95  2.88  3.01   FEV1-Predicted Pre % 113  P 110  108  111   FEV1-Post L    3.14   DLCO uncorrected ml/min/mmHg 15.96  P 15.93  15.03  18.32   DLCO UNC% %  69  P 69  65  78   DLCO corrected ml/min/mmHg 15.96  P 15.93  15.03  18.32   DLCO COR %Predicted % 69  P 69  65  78   DLVA Predicted % 78  P 80  78  96   TLC L    5.30   TLC % Predicted %    79   RV % Predicted %    67     P Preliminary result    HRCT June 2023 v March 2022  Narrative & Impression  CLINICAL DATA:  81 year old male with history of pulmonary nodules and interstitial lung disease. Follow-up study.   EXAM: CT CHEST WITHOUT CONTRAST   TECHNIQUE: Multidetector CT imaging of the chest was performed following the standard protocol without intravenous contrast. High resolution imaging of the lungs, as well as inspiratory and expiratory imaging, was performed.   RADIATION DOSE REDUCTION: This exam was performed according to the departmental dose-optimization program which includes automated exposure control, adjustment of the mA and/or kV according to patient size and/or use of iterative reconstruction technique.   COMPARISON:  High-resolution chest CT 05/21/2020.   FINDINGS: Cardiovascular: Heart size is normal. Trace amount of pericardial fluid and/or thickening, unlikely to be of any hemodynamic significance at this time. No pericardial calcification. There is aortic atherosclerosis, as well as atherosclerosis of the great vessels of the mediastinum and the coronary arteries, including calcified atherosclerotic plaque in the left anterior descending, left circumflex and right coronary arteries.   Mediastinum/Nodes: No pathologically enlarged mediastinal or hilar Lymph nodes. Please note that accurate exclusion of hilar adenopathy is limited on noncontrast CT scans. Esophagus is unremarkable in appearance. No  axillary lymphadenopathy.   Lungs/Pleura: High-resolution images demonstrate peripheral predominant areas of mild ground-glass attenuation, widespread septal thickening, extensive subpleural reticulation, cylindrical traction bronchiectasis, peripheral  bronchiolectasis, and developing areas of honeycombing. These findings appear mildly progressive compared to the prior study and have a definitive craniocaudal gradient. No acute consolidative airspace disease. No pleural effusions. Inspiratory and expiratory imaging is unremarkable. Several small pulmonary nodules are again noted in the lungs bilaterally, most evident in the apices of the lungs measuring 5 mm or less in size, stable compared to the prior study, likely benign.   Upper Abdomen: Atherosclerotic calcifications in the thoracic aorta.   Musculoskeletal: There are no aggressive appearing lytic or blastic lesions noted in the visualized portions of the skeleton.   IMPRESSION: 1. Progressively worsening interstitial lung disease with imaging findings considered diagnostic of usual interstitial pneumonia (UIP) per current ATS guidelines. 2. Small pulmonary nodules measuring 5 mm or less in size, stable compared to the prior study, considered definitively benign. 3. Aortic atherosclerosis, in addition to three-vessel coronary artery disease. Please note that although the presence of coronary artery calcium documents the presence of coronary artery disease, the severity of this disease and any potential stenosis cannot be assessed on this non-gated CT examination. Assessment for potential risk factor modification, dietary therapy or pharmacologic therapy may be warranted, if clinically indicated.   Aortic Atherosclerosis (ICD10-I70.0).     Electronically Signed   By: Vinnie Langton M.D.   On: 08/30/2021 13:08     has a past medical history of Anemia of chronic disease, Atherosclerosis of both carotid arteries, Atypical mole (03/28/2013), Barrett's esophagus, Basal cell carcinoma (12/17/2015), Dizziness, GERD (gastroesophageal reflux disease), adenomatous colonic polyps, Hyperlipidemia, Macrocytosis without anemia, Obesity, OSA (obstructive sleep apnea), Rheumatoid  arthritis(714.0), Squamous cell carcinoma of skin (02/25/2010), Squamous cell carcinoma of skin (02/25/2010), Squamous cell carcinoma of skin (03/24/2011), Squamous cell carcinoma of skin (03/24/2011), Squamous cell carcinoma of skin (03/24/2011), Squamous cell carcinoma of skin (05/05/2011), Squamous cell carcinoma of skin (11/28/2013), Squamous cell carcinoma of skin (12/17/2015), Squamous cell carcinoma of skin (12/17/2015), Squamous cell carcinoma of skin (01/03/2016), Squamous cell carcinoma of skin (04/22/2016), Squamous cell carcinoma of skin (10/06/2016), Squamous cell carcinoma of skin (12/22/2016), Squamous cell carcinoma of skin (01/11/2018), Squamous cell carcinoma of skin (01/11/2018), Squamous cell carcinoma of skin (01/11/2018), Squamous cell carcinoma of skin (01/11/2018), Squamous cell carcinoma of skin (03/07/2019), Squamous cell carcinoma of skin (03/07/2019), Squamous cell carcinoma of skin (03/07/2019), Squamous cell carcinoma of skin (01/04/2020), Squamous cell carcinoma of skin (01/04/2020), Squamous cell carcinoma of skin (01/04/2020), and Syncope.   reports that he has never smoked. He has never used smokeless tobacco.  Past Surgical History:  Procedure Laterality Date   CORONARY STENT INTERVENTION N/A 06/24/2018   Procedure: CORONARY STENT INTERVENTION;  Surgeon: Burnell Blanks, MD;  Location: [facility];  Service: Cardiovascular;  Laterality: N/A;   IR IVC FILTER PLMT / S&I /IMG GUID/MOD SED  10/07/2018   IR IVC FILTER RETRIEVAL / S&I /IMG GUID/MOD SED  06/15/2019   IR RADIOLOGIST EVAL & MGMT  09/27/2018   IR RADIOLOGIST EVAL & MGMT  01/04/2019   JOINT REPLACEMENT  2004   left   LEFT HEART CATH AND CORONARY ANGIOGRAPHY N/A 06/24/2018   Procedure: LEFT HEART CATH AND CORONARY ANGIOGRAPHY;  Surgeon: Burnell Blanks, MD;  Location: [facility];  Service: Cardiovascular;  Laterality: N/A;    No Known Allergies  Immunization History  Administered  Date(s) Administered  Influenza, High Dose Seasonal PF 12/18/2013, 12/18/2014, 12/13/2017   Influenza, Quadrivalent, Recombinant, Inj, Pf 12/14/2016, 12/07/2018   Influenza-Unspecified 12/01/2012, 11/30/2020   PFIZER(Purple Top)SARS-COV-2 Vaccination 03/23/2019, 04/13/2019, 11/30/2020   Tdap 06/23/2018   Zoster Recombinat (Shingrix) 05/08/2017, 07/09/2017    Family History  Problem Relation Age of Onset   Stroke Mother    Glaucoma Mother    Cancer Father    Prostate cancer Father    Colon cancer Father      Current Outpatient Medications:    atorvastatin (LIPITOR) 40 MG tablet, TAKE 1 TABLET(40 MG) BY MOUTH DAILY, Disp: 90 tablet, Rfl: 3   azaTHIOprine (IMURAN) 50 MG tablet, Take 50 mg by mouth 2 (two) times daily., Disp: , Rfl:    ELIQUIS 5 MG TABS tablet, TAKE 1 TABLET(5 MG) BY MOUTH TWICE DAILY, Disp: 60 tablet, Rfl: 5   esomeprazole (NEXIUM) 20 MG capsule, Take 20 mg by mouth daily at 12 noon. , Disp: , Rfl:    folic acid (FOLVITE) 1 MG tablet, Take by mouth., Disp: , Rfl:    Multiple Vitamins-Minerals (MULTIVITAMIN WITH MINERALS) tablet, Take 1 tablet by mouth daily., Disp: , Rfl:    mupirocin ointment (BACTROBAN) 2 %, Apply to wound qd-bid, Disp: 22 g, Rfl: 4   Niacinamide-Zn-Cu-Methfo-Se-Cr (NICOTINAMIDE PO), Take 500 mg by mouth., Disp: , Rfl:    predniSONE (DELTASONE) 5 MG tablet, 1 tablet, Disp: , Rfl:       Objective:   Vitals:   10/13/21 1626  BP: 124/70  Pulse: 64  Temp: 98.1 F (36.7 C)  TempSrc: Oral  SpO2: 98%  Weight: 187 lb 3.2 oz (84.9 kg)  Height: '5\' 8"'$  (1.727 m)    Estimated body mass index is 28.46 kg/m as calculated from the following:   Height as of this encounter: '5\' 8"'$  (1.727 m).   Weight as of this encounter: 187 lb 3.2 oz (84.9 kg).  '@WEIGHTCHANGE'$ @  [employer]   10/13/21 1626  Weight: 187 lb 3.2 oz (84.9 kg)     Physical ExamGeneral: No distress. Looks well Neuro: Alert and Oriented x 3. GCS 15. Speech normal Psych:  Pleasant Resp:  Barrel Chest - no.  Wheeze - no, Crackles - YES BASE, No overt respiratory distress CVS: Normal heart sounds. Murmurs - no Ext: Stigmata of Connective Tissue Disease - nRA HEENT: Normal upper airway. PEERL +. No post nasal drip        Assessment:       ICD-10-CM   1. ILD (interstitial lung disease) ([facility])  J84.9 Pulmonary function test    2. Multiple lung nodules on CT  R91.8     3. History of rheumatoid arthritis  Z87.39          Plan:     Patient Instructions     ICD-10-CM   1. Interstitial lung disease due to connective tissue disease ([place])  J84.89    M35.9   2. ILD (interstitial lung disease) ([place])  J84.9 Pulmonary function test    Pulse oximetry, overnight    CT CHEST HIGH RESOLUTION  3. History of rheumatoid arthritis  Z87.39   4. 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 However, stable son PFT betwee Nov 2022 - > Aug  2023   - I think immuran is helping control the ILD  Multiple small lung nodules probably due to rheumatoid arthritis (March 2022 last CT)]  - no change Aug 2023 and considered benign  Plan -\continue immuran via Dr Dossie Der - Hold off on starting anti-fibrotic ofev/esbriet  - based on shared decision making -  Appreciate  ILD PRO registry visit -Do spirometry and DLCO in 6 months - Vaccines  - RSV, flu and covid boosters in fall 2023  Follow-up - Return in 6 months -but after breathing test   -Symptom score and walking desaturation test at follow-up  - [place] for visitor to come with you to exam room    SIGNATURE    Dr. Brand Males, M.D., F.C.C.P,  Pulmonary and Critical Care Medicine Staff Physician, [place] Director - Interstitial Lung Disease  Program  Pulmonary  [facility] [place], [place], [address]  Pager: [phone], If no answer or between  15:00h - 7:00h: call [phone] Telephone: [phone]  5:24 PM 10/13/2021

## 2021-10-13 NOTE — Progress Notes (Signed)
Spirometry and DLCO Performed Today.  

## 2021-10-14 DIAGNOSIS — M180 Bilateral primary osteoarthritis of first carpometacarpal joints: Secondary | ICD-10-CM | POA: Diagnosis not present

## 2021-10-14 DIAGNOSIS — M19031 Primary osteoarthritis, right wrist: Secondary | ICD-10-CM | POA: Diagnosis not present

## 2021-10-14 DIAGNOSIS — M19032 Primary osteoarthritis, left wrist: Secondary | ICD-10-CM | POA: Diagnosis not present

## 2021-10-14 DIAGNOSIS — G5603 Carpal tunnel syndrome, bilateral upper limbs: Secondary | ICD-10-CM | POA: Diagnosis not present

## 2021-10-14 NOTE — Research (Signed)
Title: Chronic Fibrosing Interstitial Lung Disease with Progressive Phenotype Prospective Outcomes (ILD-PRO) Registry    Protocol #: IPF-PRO-SUB, Clinical Trials # [id number], Sponsor: [employer]   Protocol Version Amendment 4 dated 12Sep2019  and confirmed current on  Consent Version for today's visit date of  Is [place] [employer] Approved Version 03 Feb 2018 Revised 03 Feb 2018   Objectives:  Describe current approaches to diagnosis and treatment of chronic fibrosing ILDs with progressive phenotype  Describe the natural history of chronic fibrosing ILDs with progressive phenotype  Assess quality of life from self-administered participant reported questionnaires for each disease group  Describe participant interactions with the healthcare system, describe treatment practices across multiple institutions for each disease group  Collect biological samples linked to well characterized chronic fibrosing ILDs with progressive phenotype to identify disease biomarkers  Collect data and biological samples that will support future research studies.                                            Key Inclusion Criteria: Willing and able to provide informed consent  Age ? 30 years  Diagnosis of a non-IPF ILD of any duration, including, but not limited to Idiopathic Non-Specific Interstitial Pneumonia (INSIP), Unclassifiable Idiopathic Interstitial Pneumonias (IIPs), Interstitial Pneumonia with Autoimmune Features (IPAF), Autoimmune ILDs such as Rheumatoid Arthritis (RA-ILD) and Systemic Sclerosis (SSC-ILD), Chronic Hypersensitivity Pneumonitis (HP), Sarcoidosis or Exposure-related ILDs such as asbestosis.  Chronic fibrosing ILD defined by reticular abnormality with traction bronchiectasis with or without honeycombing confirmed by chest HRCT scan and/or lung biopsy.  Progressive phenotype as defined by fulfilling at least one of the criteria below of fibrotic changes (progression set point)  within the last 24 months regardless of treatment considered appropriate in individual ILDs:  decline in FVC % predicted (% pred) based on >10% relative decline  decline in FVC % pred based on ? 5 - <10% relative decline in FVC combined with worsening of respiratory symptoms as assessed by the site investigator  decline in FVC % pred based on ? 5 - <10% relative decline in FVC combined with increasing extent of fibrotic changes on chest imaging (HRCT scan) as assessed by the site investigator  decline in DLCO % pred based on ? 10% relative decline  worsening of respiratory symptoms as well as increasing extent of fibrotic changes on chest imaging (HRCT scan) as assessed by the site investigator independent of FVC change.     Key Exclusion Criteria: Malignancy, treated or untreated, other than skin or early stage prostate cancer, within the past 5 years  Currently listed for lung transplantation at the time of enrollment  Currently enrolled in a clinical trial at the time of enrollment in this registry       Clinical Research Coordinator / Research RN note : This visit for Nicholas Webb Subject [phone] with [phone] on [phone] for the above protocol is Visit/Encounter 1 and is for purpose of research.    Subject expressed continued interest and consent in continuing as a study subject. Subject confirmed that there was no change in contact information (e.g. address, telephone, email). Subject thanked for participation in research and contribution to science.     During this visit on 14Aug2023 , the subject completed the blood work and questionnaires per the above referenced protocol. Please refer to the subject's paper source binder for further details.   Signed  by [place] [facility]  [place], [place] 15Aug2023 4:26pm

## 2021-10-30 DIAGNOSIS — M180 Bilateral primary osteoarthritis of first carpometacarpal joints: Secondary | ICD-10-CM | POA: Diagnosis not present

## 2021-10-30 DIAGNOSIS — M25541 Pain in joints of right hand: Secondary | ICD-10-CM | POA: Diagnosis not present

## 2021-11-06 DIAGNOSIS — M180 Bilateral primary osteoarthritis of first carpometacarpal joints: Secondary | ICD-10-CM | POA: Diagnosis not present

## 2021-11-06 DIAGNOSIS — H8112 Benign paroxysmal vertigo, left ear: Secondary | ICD-10-CM | POA: Diagnosis not present

## 2021-11-06 DIAGNOSIS — M25541 Pain in joints of right hand: Secondary | ICD-10-CM | POA: Diagnosis not present

## 2021-11-07 ENCOUNTER — Encounter: Payer: Self-pay | Admitting: Podiatry

## 2021-11-07 ENCOUNTER — Ambulatory Visit: Payer: Medicare PPO | Admitting: Podiatry

## 2021-11-07 DIAGNOSIS — D689 Coagulation defect, unspecified: Secondary | ICD-10-CM

## 2021-11-07 DIAGNOSIS — M79676 Pain in unspecified toe(s): Secondary | ICD-10-CM | POA: Diagnosis not present

## 2021-11-07 DIAGNOSIS — B351 Tinea unguium: Secondary | ICD-10-CM | POA: Diagnosis not present

## 2021-11-07 NOTE — Progress Notes (Signed)
This patient returns to my office for at risk foot care.  This patient requires this care by a professional since this patient will be at risk due to having coagulation defect.  Patient is taking eliquis.  This patient is unable to cut nails himself since the patient cannot reach his nails.These nails are painful walking and wearing shoes.  This patient presents for at risk foot care today.  General Appearance  Alert, conversant and in no acute stress.  Vascular  Dorsalis pedis and posterior tibial  pulses are palpable  bilaterally.  Capillary return is within normal limits  bilaterally. Temperature is within normal limits  bilaterally.  Neurologic  Senn-Weinstein monofilament wire test within normal limits  bilaterally. Muscle power within normal limits bilaterally.  Nails Thick disfigured discolored nails with subungual debris  from hallux to fifth toes bilaterally. No evidence of bacterial infection or drainage bilaterally.  Orthopedic  No limitations of motion  feet .  No crepitus or effusions noted.  No bony pathology or digital deformities noted.  DJD 1st MPJ right foot. Bony prominence medial aspect both rearfeet.  Skin  normotropic skin with no porokeratosis noted bilaterally.  No signs of infections or ulcers noted.     Onychomycosis  Pain in right toes  Pain in left toes  Consent was obtained for treatment procedures.   Mechanical debridement of nails 1-5  bilaterally performed with a nail nipper.  Filed with dremel without incident.    Return office visit    4 months                 Told patient to return for periodic foot care and evaluation due to potential at risk complications.   Dayln Tugwell DPM  

## 2021-11-17 DIAGNOSIS — J849 Interstitial pulmonary disease, unspecified: Secondary | ICD-10-CM | POA: Diagnosis not present

## 2021-11-17 DIAGNOSIS — Z79899 Other long term (current) drug therapy: Secondary | ICD-10-CM | POA: Diagnosis not present

## 2021-11-17 DIAGNOSIS — Z0000 Encounter for general adult medical examination without abnormal findings: Secondary | ICD-10-CM | POA: Diagnosis not present

## 2021-11-17 DIAGNOSIS — I5032 Chronic diastolic (congestive) heart failure: Secondary | ICD-10-CM | POA: Diagnosis not present

## 2021-11-17 DIAGNOSIS — E785 Hyperlipidemia, unspecified: Secondary | ICD-10-CM | POA: Diagnosis not present

## 2021-11-17 DIAGNOSIS — M069 Rheumatoid arthritis, unspecified: Secondary | ICD-10-CM | POA: Diagnosis not present

## 2021-11-17 DIAGNOSIS — K219 Gastro-esophageal reflux disease without esophagitis: Secondary | ICD-10-CM | POA: Diagnosis not present

## 2021-11-17 DIAGNOSIS — I2510 Atherosclerotic heart disease of native coronary artery without angina pectoris: Secondary | ICD-10-CM | POA: Diagnosis not present

## 2021-11-17 DIAGNOSIS — R7303 Prediabetes: Secondary | ICD-10-CM | POA: Diagnosis not present

## 2021-11-18 ENCOUNTER — Ambulatory Visit: Payer: Medicare PPO | Admitting: Physician Assistant

## 2021-11-20 ENCOUNTER — Ambulatory Visit: Payer: Medicare PPO | Admitting: Physician Assistant

## 2021-12-11 DIAGNOSIS — Z85828 Personal history of other malignant neoplasm of skin: Secondary | ICD-10-CM | POA: Diagnosis not present

## 2021-12-11 DIAGNOSIS — L814 Other melanin hyperpigmentation: Secondary | ICD-10-CM | POA: Diagnosis not present

## 2021-12-11 DIAGNOSIS — Z08 Encounter for follow-up examination after completed treatment for malignant neoplasm: Secondary | ICD-10-CM | POA: Diagnosis not present

## 2021-12-11 DIAGNOSIS — L821 Other seborrheic keratosis: Secondary | ICD-10-CM | POA: Diagnosis not present

## 2021-12-11 DIAGNOSIS — L570 Actinic keratosis: Secondary | ICD-10-CM | POA: Diagnosis not present

## 2021-12-11 DIAGNOSIS — D1801 Hemangioma of skin and subcutaneous tissue: Secondary | ICD-10-CM | POA: Diagnosis not present

## 2021-12-30 ENCOUNTER — Inpatient Hospital Stay: Payer: Medicare PPO | Attending: Oncology | Admitting: Oncology

## 2021-12-30 VITALS — BP 100/61 | HR 76 | Temp 97.8°F | Resp 18 | Ht 68.0 in | Wt 177.8 lb

## 2021-12-30 DIAGNOSIS — I2699 Other pulmonary embolism without acute cor pulmonale: Secondary | ICD-10-CM | POA: Insufficient documentation

## 2021-12-30 DIAGNOSIS — M79643 Pain in unspecified hand: Secondary | ICD-10-CM | POA: Diagnosis not present

## 2021-12-30 DIAGNOSIS — R918 Other nonspecific abnormal finding of lung field: Secondary | ICD-10-CM | POA: Insufficient documentation

## 2021-12-30 DIAGNOSIS — R590 Localized enlarged lymph nodes: Secondary | ICD-10-CM | POA: Diagnosis not present

## 2021-12-30 DIAGNOSIS — I252 Old myocardial infarction: Secondary | ICD-10-CM | POA: Insufficient documentation

## 2021-12-30 DIAGNOSIS — Z79899 Other long term (current) drug therapy: Secondary | ICD-10-CM | POA: Insufficient documentation

## 2021-12-30 DIAGNOSIS — Z8719 Personal history of other diseases of the digestive system: Secondary | ICD-10-CM | POA: Diagnosis not present

## 2021-12-30 DIAGNOSIS — Z86718 Personal history of other venous thrombosis and embolism: Secondary | ICD-10-CM | POA: Diagnosis not present

## 2021-12-30 DIAGNOSIS — M069 Rheumatoid arthritis, unspecified: Secondary | ICD-10-CM | POA: Diagnosis not present

## 2021-12-30 DIAGNOSIS — J8410 Pulmonary fibrosis, unspecified: Secondary | ICD-10-CM | POA: Insufficient documentation

## 2021-12-30 DIAGNOSIS — I2510 Atherosclerotic heart disease of native coronary artery without angina pectoris: Secondary | ICD-10-CM | POA: Diagnosis not present

## 2021-12-30 NOTE — Progress Notes (Signed)
[facility] OFFICE PROGRESS NOTE   Diagnosis: Pulmonary embolism, chest lymphadenopathy  INTERVAL HISTORY:   Nicholas Webb returns as scheduled.  He feels well.  He reports a good appetite.  He complains of pain in the hands from rheumatoid arthritis.  He has occasional pain at the left anterolateral chest.  No bleeding or symptom of recurrent thrombosis. He is followed by Dr. Chase Caller for pulmonary fibrosis and lung nodules.  Objective:  Vital signs in last 24 hours:  Blood pressure 100/61, pulse 76, temperature 97.8 F (36.6 C), temperature source Oral, resp. rate 18, height '5\' 8"'$  (1.727 m), weight 177 lb 12.8 oz (80.6 kg), SpO2 100 %.   Lymphatics: No cervical, supraclavicular, axillary, or inguinal nodes Resp: Inspiratory rales at the lower greater than upper posterior chest bilaterally, no respiratory distress Cardio: Regular rate and rhythm GI: No hepatosplenomegaly Vascular: Left lower leg is larger than right side Musculoskeletal: No evidence of recurrent tumor at the parietal scalp scar, bony prominence at the medial left mid tibia    Lab Results:  Lab Results  Component Value Date   WBC 6.8 10/07/2018   HGB 12.8 (L) 10/07/2018   HCT 40.2 10/07/2018   MCV 104.7 (H) 10/07/2018   PLT 146 (L) 10/07/2018   NEUTROABS 5.7 06/23/2018    CMP  Lab Results  Component Value Date   NA 140 10/07/2018   K 4.1 10/07/2018   CL 104 10/07/2018   CO2 27 10/07/2018   GLUCOSE 99 10/07/2018   BUN 21 10/07/2018   CREATININE 0.90 06/15/2019   CALCIUM 9.2 10/07/2018   PROT 6.4 (L) 06/24/2018   ALBUMIN 3.2 (L) 06/24/2018   AST 40 06/24/2018   ALT 49 (H) 06/24/2018   ALKPHOS 67 06/24/2018   BILITOT 0.8 06/24/2018   GFRNONAA >60 10/07/2018   GFRAA >60 10/07/2018    Lab Results  Component Value Date   CEA1 1.4 06/27/2018    Medications: I have reviewed the patient's current medications.   Assessment/Plan: Skull mass, chest lymphadenopathy MRI brain  06/24/2018-posterior vertex infiltrated and poorly marginated mass with extending into the underlying pachymeningeal's and into the scalp, the mass invades and narrows the superior sagittal sinus CTs of the chest, abdomen, and pelvis on 06/25/2018-acute bilateral pulmonary emboli with a large clot burden, necrotic mediastinal adenopathy, 8 mm pleural-based left lower lobe nodule, 4 mm left upper lobe nodules, chronic pulmonary fibrosis PET scan 07/06/2018- no evidence of a primary malignancy, small pulmonary nodules are not metabolically active, mildly enlarged/mildly metabolic mediastinal nodes favored to be reactive MRI brain 10/10/2018-progression of calvarial lesion at the vertex, stable 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  Pulmonary fibrosis.  Multiple nonspecific pulmonary nodules CT chest 08/29/2021-progressive interstitial lung disease, stable pulmonary nodules, no large mediastinal or hilar nodes 2.  NSTEMI-cardiac catheterization 06/24/2018 with 99% LAD stenosis, status post placement of a drug-eluting stent 3.  Bilateral pulmonary emboli on CT 06/25/2018- treated with heparin, now maintained on apixaban Doppler 06/27/2018- acute left femoral vein thrombosis   4.  History of gastroesophageal reflux disease and Barrett's esophagus 5.  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  Rheumatoid arthritis, treated with Humira and methotrexate, currently treated with Imuran and prednisone 7.  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  Pulmonary fibrosis noted on CT 06/25/2018        Disposition: Nicholas Webb appears stable.  There is no clinical evidence for a progressive  malignancy.  He has lost weight over the past year, but reports a good appetite.  He continues follow-up with rheumatology.  He is followed by Dr. Chase Caller for pulmonary fibrosis and lung nodules.  The of bony prominence at the left tibia is likely a benign finding, potentially related to rheumatoid arthritis.  He will call for pain in this area or if the bony prominence enlarges.  Nicholas Webb will return for an office visit in 1 year.  [name], MD  12/30/2021  10:29 AM

## 2022-01-09 ENCOUNTER — Ambulatory Visit: Payer: Medicare PPO | Admitting: Neurology

## 2022-01-12 DIAGNOSIS — K2270 Barrett's esophagus without dysplasia: Secondary | ICD-10-CM | POA: Diagnosis not present

## 2022-01-20 ENCOUNTER — Other Ambulatory Visit: Payer: Self-pay | Admitting: Oncology

## 2022-01-27 DIAGNOSIS — I2510 Atherosclerotic heart disease of native coronary artery without angina pectoris: Secondary | ICD-10-CM | POA: Diagnosis not present

## 2022-01-27 DIAGNOSIS — G4733 Obstructive sleep apnea (adult) (pediatric): Secondary | ICD-10-CM | POA: Diagnosis not present

## 2022-02-04 DIAGNOSIS — R031 Nonspecific low blood-pressure reading: Secondary | ICD-10-CM | POA: Diagnosis not present

## 2022-02-04 DIAGNOSIS — J40 Bronchitis, not specified as acute or chronic: Secondary | ICD-10-CM | POA: Diagnosis not present

## 2022-02-04 DIAGNOSIS — J849 Interstitial pulmonary disease, unspecified: Secondary | ICD-10-CM | POA: Diagnosis not present

## 2022-02-13 DIAGNOSIS — I2510 Atherosclerotic heart disease of native coronary artery without angina pectoris: Secondary | ICD-10-CM | POA: Diagnosis not present

## 2022-02-13 DIAGNOSIS — Z79899 Other long term (current) drug therapy: Secondary | ICD-10-CM | POA: Diagnosis not present

## 2022-02-13 DIAGNOSIS — M62838 Other muscle spasm: Secondary | ICD-10-CM | POA: Diagnosis not present

## 2022-02-13 DIAGNOSIS — M150 Primary generalized (osteo)arthritis: Secondary | ICD-10-CM | POA: Diagnosis not present

## 2022-02-13 DIAGNOSIS — E559 Vitamin D deficiency, unspecified: Secondary | ICD-10-CM | POA: Diagnosis not present

## 2022-02-13 DIAGNOSIS — M0589 Other rheumatoid arthritis with rheumatoid factor of multiple sites: Secondary | ICD-10-CM | POA: Diagnosis not present

## 2022-02-13 DIAGNOSIS — M8580 Other specified disorders of bone density and structure, unspecified site: Secondary | ICD-10-CM | POA: Diagnosis not present

## 2022-02-16 DIAGNOSIS — G5603 Carpal tunnel syndrome, bilateral upper limbs: Secondary | ICD-10-CM | POA: Diagnosis not present

## 2022-02-16 DIAGNOSIS — M180 Bilateral primary osteoarthritis of first carpometacarpal joints: Secondary | ICD-10-CM | POA: Diagnosis not present

## 2022-03-09 ENCOUNTER — Ambulatory Visit: Payer: Medicare PPO | Admitting: Podiatry

## 2022-03-09 ENCOUNTER — Encounter: Payer: Self-pay | Admitting: Podiatry

## 2022-03-09 DIAGNOSIS — B351 Tinea unguium: Secondary | ICD-10-CM | POA: Diagnosis not present

## 2022-03-09 DIAGNOSIS — M79676 Pain in unspecified toe(s): Secondary | ICD-10-CM | POA: Diagnosis not present

## 2022-03-09 DIAGNOSIS — D689 Coagulation defect, unspecified: Secondary | ICD-10-CM

## 2022-03-09 NOTE — Progress Notes (Signed)
This patient returns to my office for at risk foot care.  This patient requires this care by a professional since this patient will be at risk due to having coagulation defect.  Patient is taking eliquis.  This patient is unable to cut nails himself since the patient cannot reach his nails.These nails are painful walking and wearing shoes.  This patient presents for at risk foot care today.  General Appearance  Alert, conversant and in no acute stress.  Vascular  Dorsalis pedis and posterior tibial  pulses are palpable  bilaterally.  Capillary return is within normal limits  bilaterally. Temperature is within normal limits  bilaterally.  Neurologic  Senn-Weinstein monofilament wire test within normal limits  bilaterally. Muscle power within normal limits bilaterally.  Nails Thick disfigured discolored nails with subungual debris  from hallux to fifth toes bilaterally. No evidence of bacterial infection or drainage bilaterally.  Orthopedic  No limitations of motion  feet .  No crepitus or effusions noted.  No bony pathology or digital deformities noted.  DJD 1st MPJ right foot. Bony prominence medial aspect both rearfeet.  Skin  normotropic skin with no porokeratosis noted bilaterally.  No signs of infections or ulcers noted.     Onychomycosis  Pain in right toes  Pain in left toes  Consent was obtained for treatment procedures.   Mechanical debridement of nails 1-5  bilaterally performed with a nail nipper.  Filed with dremel without incident.    Return office visit    4 months                 Told patient to return for periodic foot care and evaluation due to potential at risk complications.   Gardiner Barefoot DPM

## 2022-03-12 DIAGNOSIS — G4733 Obstructive sleep apnea (adult) (pediatric): Secondary | ICD-10-CM | POA: Diagnosis not present

## 2022-03-31 ENCOUNTER — Ambulatory Visit (INDEPENDENT_AMBULATORY_CARE_PROVIDER_SITE_OTHER): Payer: Medicare PPO | Admitting: Internal Medicine

## 2022-03-31 ENCOUNTER — Encounter: Payer: Medicare PPO | Admitting: *Deleted

## 2022-03-31 ENCOUNTER — Encounter: Payer: Self-pay | Admitting: Internal Medicine

## 2022-03-31 ENCOUNTER — Ambulatory Visit: Payer: Medicare PPO | Admitting: Internal Medicine

## 2022-03-31 VITALS — BP 116/64 | HR 66 | Temp 97.7°F | Ht 68.0 in | Wt 187.0 lb

## 2022-03-31 DIAGNOSIS — J8489 Other specified interstitial pulmonary diseases: Secondary | ICD-10-CM

## 2022-03-31 DIAGNOSIS — Z8739 Personal history of other diseases of the musculoskeletal system and connective tissue: Secondary | ICD-10-CM | POA: Diagnosis not present

## 2022-03-31 DIAGNOSIS — M359 Systemic involvement of connective tissue, unspecified: Secondary | ICD-10-CM | POA: Diagnosis not present

## 2022-03-31 DIAGNOSIS — J849 Interstitial pulmonary disease, unspecified: Secondary | ICD-10-CM

## 2022-03-31 DIAGNOSIS — Z006 Encounter for examination for normal comparison and control in clinical research program: Secondary | ICD-10-CM

## 2022-03-31 LAB — PULMONARY FUNCTION TEST
DL/VA % pred: 83 %
DL/VA: 3.28 ml/min/mmHg/L
DLCO cor % pred: 68 %
DLCO cor: 15.73 ml/min/mmHg
DLCO unc % pred: 68 %
DLCO unc: 15.73 ml/min/mmHg
FEF 25-75 Pre: 2.93 L/sec
FEF2575-%Pred-Pre: 163 %
FEV1-%Pred-Pre: 109 %
FEV1-Pre: 2.88 L
FEV1FVC-%Pred-Pre: 113 %
FEV6-%Pred-Pre: 103 %
FEV6-Pre: 3.56 L
FEV6FVC-%Pred-Pre: 107 %
FVC-%Pred-Pre: 96 %
FVC-Pre: 3.56 L
Pre FEV1/FVC ratio: 81 %
Pre FEV6/FVC Ratio: 100 %

## 2022-03-31 NOTE — Patient Instructions (Signed)
Spirometry/DLCO performed today.

## 2022-03-31 NOTE — Progress Notes (Signed)
OV 07/17/2020  Subjective:  Patient ID: Nicholas Webb, male , DOB: 21-Jan-1941 , age 82 y.o. , MRN: [id number] , ADDRESS: [address] [place] [address] PCP Maurice Small, MD Patient Care Team: Maurice Small, MD as PCP - General (Family Medicine) Jerline Pain, MD as PCP - Cardiology (Cardiology) Warren Danes, PA-C as Physician Assistant (Dermatology) [name], MD as Consulting Physician (Dermatology)  This Provider for this visit: Treatment Team:  Attending Provider: Brand Males, MD    07/17/2020 -   Chief Complaint  Patient presents with   Follow-up    PFT performed 06/17/20. Pt states he is about the same since last visit.   Rheumatoid arthritis with interstitial lung disease History of pulmonary embolism on Eliquis - since April 2020 History of coronary artery disease follows Dr. Marlou Porch  HPI Nicholas Webb 82 y.o. -presents for follow-up.  He is accompanied by his daughter.  This visit is to focus on discussing the results.  His symptoms are extremely mild from a respiratory standpoint documented below.  His high-resolution CT chest shows interstitial lung disease probable UIP pattern.  He also has multiple small pulmonary nodules.  There is coronary artery calcification [he is known to have coronary artery disease and follows Dr. Marlou Porch.  His pulmonary function test shows very mild restriction from his ILD.  His walking desaturation test is essentially close to normal except for tachycardia.  Taken together the burden of ILD is mild.  His daughter wants to know how to approach his ILD.  They report that he was on methotrexate for many years [greater than 10].  There was wheezing and that is why he was referred to [place].  He is currently off methotrexate.  He started working out at [employer] well     HRCT Chest data march 2022 CLINICAL DATA:  Interstitial lung disease, history of rheumatoid arthritis   EXAM: CT CHEST WITHOUT CONTRAST    TECHNIQUE: Multidetector CT imaging of the chest was performed following the standard protocol without intravenous contrast. High resolution imaging of the lungs, as well as inspiratory and expiratory imaging, was performed.   COMPARISON:  None.   FINDINGS: Cardiovascular: Aortic atherosclerosis. Normal heart size. Left coronary artery calcifications and stents. No pericardial effusion.   Mediastinum/Nodes: No enlarged mediastinal, hilar, or axillary lymph nodes. Thyroid gland, trachea, and esophagus demonstrate no significant findings.   Lungs/Pleura: There is moderate pulmonary fibrosis in a pattern with apical to basal gradient featuring irregular peripheral interstitial opacity, septal thickening, mild traction bronchiectasis, areas of subpleural bronchiolectasis without clear evidence of honeycombing. No significant air trapping on expiratory phase imaging. There multiple small pulmonary nodules in the bilateral lung apices, generally irregular in morphology and measuring up to 5 mm, for example in the left apex (series 10, image 58). No pleural effusion or pneumothorax.   Upper Abdomen: No acute abnormality.   Musculoskeletal: No chest wall mass or suspicious bone lesions identified.  IMPRESSION: 1. There is moderate pulmonary fibrosis in a pattern with apical to basal gradient featuring irregular peripheral interstitial opacity, septal thickening, mild traction bronchiectasis, and areas of subpleural bronchiolectasis without clear evidence of honeycombing. Findings are consistent with a "probable UIP" pattern and generally in keeping with rheumatoid associated interstitial lung disease. Findings are categorized as probable UIP per consensus guidelines: Diagnosis of Idiopathic Pulmonary Fibrosis: An Official ATS/ERS/JRS/ALAT Clinical Practice Guideline. [place], Iss 5, [phone], Oct 31 2016. 2. There multiple small nonspecific  pulmonary  nodules in the bilateral lung apices, generally irregular in morphology and measuring 5 mm and smaller. No follow-up needed if patient is low-risk (and has no known or suspected primary neoplasm). Non-contrast chest CT can be considered in 12 months if patient is high-risk. This recommendation follows the consensus statement: Guidelines for Management of Incidental Pulmonary Nodules Detected on CT Images: From the Fleischner Society 2017; Radiology 2017; 284:228-243. 3. Coronary artery disease.   Aortic Atherosclerosis (ICD10-I70.0).     Electronically Signed   By: Eddie Candle M.D.   On: 05/22/2020 11:58      OV 01/16/2021  Subjective:  Patient ID: Nicholas Webb, male , DOB: [birth year] , age 69 y.o. , MRN: [id number] , ADDRESS: [address] [place] [address] PCP Maurice Small, MD (Inactive) Patient Care Team: Maurice Small, MD (Inactive) as PCP - General (Family Medicine) Jerline Pain, MD as PCP - Cardiology (Cardiology) Starlyn Skeans as Physician Assistant (Dermatology) [name], MD as Consulting Physician (Dermatology)  This Provider for this visit: Treatment Team:  Attending Provider: Brand Males, MD   Rheumatoid arthritis with interstitial lung disease  -Started on Imuran by rheumatology between May 2022 and November 2022  -Last HRCT March 2022 History of pulmonary embolism on Eliquis - since April 2020 History of coronary artery disease follows Dr. Marlou Porch Pulmonary nodules associated with RA ILD  01/16/2021 -   Chief Complaint  Patient presents with   Follow-up    No new concerns, pft done today     HPI [name] 81 y.o. -presents for routine follow-up.  He feels stable.  His dyspnea score is all altered.  It is documented below.  His walking desaturation test simple is also unchanged.  He feels great.  In the interim his rheumatologist started him on Imuran for his rheumatoid arthritis pain.  He feels good overall.   However his pulmonary function test shows 3-4% decline in FVC and a 16% decline in DLCO.  He is not feeling this.  We discussed about adding antifibrotic to his Imuran with the understanding that the Imuran itself might be having a antifibrotic effect.  He does not want to start this right now.     CT Chest data  No results found.  OV 03/27/2021  Subjective:  Patient ID: Nicholas Webb, male , DOB: 12/18/1940 , age 19 y.o. , MRN: [id number] , ADDRESS: [address] [place] [address] PCP Maurice Small, MD Patient Care Team: Maurice Small, MD as PCP - General (Family Medicine) Jerline Pain, MD as PCP - Cardiology (Cardiology) Warren Danes, PA-C as Physician Assistant (Dermatology) [name], MD as Consulting Physician (Dermatology)  This Provider for this visit: Treatment Team:  Attending Provider: Brand Males, MD    03/27/2021 -   Chief Complaint  Patient presents with   Follow-up    10 week follow up with pft. Pt states that breathing is the same    Rheumatoid arthritis with interstitial lung disease  -Started on Imuran by rheumatology between May 2022 and November 2022  -Last HRCT March 2022 History of pulmonary embolism on Eliquis - since April 2020 History of coronary artery disease follows Dr. Marlou Porch Pulmonary nodules associated with RA ILD - 2m UL in march 2022  HPI LNOHLAN BURDIN89y.o. -returns for follow-up.  He presents with his daughter LJodi Mourningwho is nurse practitioner [facility].  [facility]  His daughter tried to encourage him in my presence.  I also encouraged him.  However he is not that motivated.  He continues on Imuran.  He feels his breathing is the same.  His symptom score shows possibly symptoms are slightly worse but he feels the same.  We did pulmonary function test today and compared to November 2022 it is stable but compared to spring of last year the DLCO has  declined.  This decline was apparent between November 2022 in April 2022.  Since starting Imuran lung function is stable.  Overall will deem this is stability.  He is interested in participating in research protocol.  In terms of lung nodules are very small.  Very likely due to rheumatoid.  Last CT scan was in March 2022.  1 year CT scan will be the spring 2023 or summer 2023.       OV 10/13/2021  Subjective:  Patient ID: Nicholas Webb, male , DOB: 1941/01/07 , age 21 y.o. , MRN: [id number] , ADDRESS: [address] [place] [facility] [address] PCP Saintclair Halsted, FNP Patient Care Team: Saintclair Halsted, FNP as PCP - General (Family Medicine) Jerline Pain, MD as PCP - Cardiology (Cardiology) Warren Danes, PA-C as Physician Assistant (Dermatology) [name], MD as Consulting Physician (Dermatology)  This Provider for this visit: Treatment Team:  Attending Provider: Brand Males, MD    10/13/2021 -   Chief Complaint  Patient presents with   Follow-up    PFT performed today.  Pt states he has been doing okay since last visit and denies any complaints.       HPI KUMAR FALWELL 82 y.o. -returns for follow-up.  He is done his ILD-Pro registry visit today.  He feels stable.  Symptom scores are mild.  Walking desaturation test is stable.  He had pulmonary function test and it is stable compared to November 2022.  November 2022 is when he started his Imuran.  Since then his pulmonary function test is stable although prior to that his pulmonary function test and his CT scan done this summer compared to pre-Imuran show progression.  Overall he is stable.  Currently is not interested in antifibrotic's.      OV 03/31/2022  Subjective:  Patient ID: Nicholas Webb, male , DOB: 01/09/1941 , age 40 y.o. , MRN: [id number] , ADDRESS: [address] [place] [facility] [id number] PCP Saintclair Halsted, FNP Patient Care Team: Saintclair Halsted, FNP as PCP - General (Family  Medicine) Jerline Pain, MD as PCP - Cardiology (Cardiology) Starlyn Skeans as Physician Assistant (Dermatology) [name], MD (Inactive) as Consulting Physician (Dermatology)  This Provider for this visit: Treatment Team:  Attending Provider: Brand Males, MD  Rheumatoid arthritis with interstitial lung disease  -Started on Imuran by rheumatology between May 2022 and November 2022  -Last HRCT March 2022 -> June 2023 with progression  - PFT progressed April 2022 - Nov 2022 -> immuran Rx -> stable Aug 2023  - ILD PRO participant History of pulmonary embolism on Eliquis - since April 2020 History of coronary artery disease follows Dr. Marlou Porch  -s/p stent April 2020 Pulmonary nodules associated with RA ILD - 106m UL in march 2022 and no change Jun 2023   03/31/2022 -   Chief Complaint  Patient presents with   Follow-up    PFT done today. Had resp infection in early dec 2023- took augmentin, now only occ cough with white sputum. His breathing is  unchanged "maybe a little better".      HPI MATVEY LLANAS 82 y.o. -returns for routine follow-up.  Last visit August 2023.  He is doing well.  He feels he is stable.  He continues on Imuran and prednisone through rheumatology.  His symptom score is stable pulmonary function test is stable.  He is up-to-date with the rest respiratory vaccines.  I personally reviewed the pulmonary function test from today.  He also participated in ILD-Pro registry visit today as a follow-up.  His walking desaturation test is stable.     SYMPTOM SCALE - ILD 07/17/2020  03/27/2021   10/13/2021  03/31/2022 immuran  O2 use ra ra ra ra  Shortness of Breath 0 -> 5 scale with 5 being worst (score 6 If unable to do)     At rest 0 0 1 0  Simple tasks - showers, clothes change, eating, shaving 0 0 1 2  Household (dishes, doing bed, laundry) 1 2 0 1  Shopping 0 0 0 1  Walking level at own pace 0 2 0 1  Walking up Stairs '1 2 1 1  '$ Total (30-36)  Dyspnea Score '2 6 3 6  '$ How bad is your cough? minimal 1 0 3  How bad is your fatigue 0 0 1 2  How bad is nausea 0 0 0 0  How bad is vomiting?  0 0 0 0  How bad is diarrhea? 0 0 00 1  How bad is anxiety? 00 2 0 1  How bad is depression 0 0 0 1       Simple office walk 185 feet x  3 laps goal with forehead probe 07/17/2020  01/16/2021  03/27/2021  10/13/2021  03/31/2022   O2 used ra ra Ra ra3 ra  Number laps completed 3 3     Comments about pace avg avg     Resting Pulse Ox/HR 98% and 74/min 97% and 71/min 99% and 67 98% and HR 64 99% and HR 70  Final Pulse Ox/HR 96% and 108/min 96% and HR 106 95% and 106 96% and HR 99 95% and HR 116  Desaturated </= 88% no no no no   Desaturated <= 3% points no no yes no   Got Tachycardic >/= 90/min yes yes yes yes Yes 4 points  Symptoms at end of test none none none No complaints   Miscellaneous comments x No change x x      PFT     Latest Ref Rng & Units 03/31/2022    9:22 AM 10/13/2021    2:50 PM 03/27/2021    9:38 AM 01/16/2021    9:36 AM 06/17/2020    9:31 AM  PFT Results  FVC-Pre L 3.56  P 3.64  3.61  3.56  3.71   FVC-Predicted Pre % 96  P 98  96  95  98   FVC-Post L     3.75   FVC-Predicted Post %     99   Pre FEV1/FVC % % 81  P 82  82  81  81   Post FEV1/FCV % %     84   FEV1-Pre L 2.88  P 2.98  2.95  2.88  3.01   FEV1-Predicted Pre % 109  P 113  110  108  111   FEV1-Post L     3.14   DLCO uncorrected ml/min/mmHg 15.73  P 15.96  15.93  15.03  18.32   DLCO UNC% % 68  P 69  69  65  78   DLCO corrected ml/min/mmHg 15.73  P 15.96  15.93  15.03  18.32   DLCO COR %Predicted % 68  P 69  69  65  78   DLVA Predicted % 83  P 78  80  78  96   TLC L     5.30   TLC % Predicted %     79   RV % Predicted %     67     P Preliminary result       has a past medical history of Anemia of chronic disease, Atherosclerosis of both carotid arteries, Atypical mole (03/28/2013), Barrett's esophagus, Basal cell carcinoma (12/17/2015), Dizziness,  GERD (gastroesophageal reflux disease), adenomatous colonic polyps, Hyperlipidemia, Macrocytosis without anemia, Obesity, OSA (obstructive sleep apnea), Rheumatoid arthritis(714.0), Squamous cell carcinoma of skin (02/25/2010), Squamous cell carcinoma of skin (02/25/2010), Squamous cell carcinoma of skin (03/24/2011), Squamous cell carcinoma of skin (03/24/2011), Squamous cell carcinoma of skin (03/24/2011), Squamous cell carcinoma of skin (05/05/2011), Squamous cell carcinoma of skin (11/28/2013), Squamous cell carcinoma of skin (12/17/2015), Squamous cell carcinoma of skin (12/17/2015), Squamous cell carcinoma of skin (01/03/2016), Squamous cell carcinoma of skin (04/22/2016), Squamous cell carcinoma of skin (10/06/2016), Squamous cell carcinoma of skin (12/22/2016), Squamous cell carcinoma of skin (01/11/2018), Squamous cell carcinoma of skin (01/11/2018), Squamous cell carcinoma of skin (01/11/2018), Squamous cell carcinoma of skin (01/11/2018), Squamous cell carcinoma of skin (03/07/2019), Squamous cell carcinoma of skin (03/07/2019), Squamous cell carcinoma of skin (03/07/2019), Squamous cell carcinoma of skin (01/04/2020), Squamous cell carcinoma of skin (01/04/2020), Squamous cell carcinoma of skin (01/04/2020), and Syncope.   reports that he has never smoked. He has never used smokeless tobacco.  Past Surgical History:  Procedure Laterality Date   CORONARY STENT INTERVENTION N/A 06/24/2018   Procedure: CORONARY STENT INTERVENTION;  Surgeon: Burnell Blanks, MD;  Location: [facility];  Service: Cardiovascular;  Laterality: N/A;   IR IVC FILTER PLMT / S&I /IMG GUID/MOD SED  10/07/2018   IR IVC FILTER RETRIEVAL / S&I /IMG GUID/MOD SED  06/15/2019   IR RADIOLOGIST EVAL & MGMT  09/27/2018   IR RADIOLOGIST EVAL & MGMT  01/04/2019   JOINT REPLACEMENT  2004   left   LEFT HEART CATH AND CORONARY ANGIOGRAPHY N/A 06/24/2018   Procedure: LEFT HEART CATH AND CORONARY ANGIOGRAPHY;  Surgeon:  Burnell Blanks, MD;  Location: [facility];  Service: Cardiovascular;  Laterality: N/A;    No Known Allergies  Immunization History  Administered Date(s) Administered   Influenza, High Dose Seasonal PF 12/18/2013, 12/18/2014, 12/13/2017   Influenza, Quadrivalent, Recombinant, Inj, Pf 12/14/2016, 12/07/2018   Influenza-Unspecified 12/01/2012, 11/30/2020, 11/26/2021   PFIZER(Purple Top)SARS-COV-2 Vaccination 03/23/2019, 04/13/2019, 11/30/2020, 11/26/2021   Tdap 06/23/2018   Zoster Recombinat (Shingrix) 05/08/2017, 07/09/2017    Family History  Problem Relation Age of Onset   Stroke Mother    Glaucoma Mother    Cancer Father    Prostate cancer Father    Colon cancer Father      Current Outpatient Medications:    atorvastatin (LIPITOR) 40 MG tablet, TAKE 1 TABLET(40 MG) BY MOUTH DAILY, Disp: 90 tablet, Rfl: 3   azaTHIOprine (IMURAN) 50 MG tablet, Take 50 mg by mouth 2 (two) times daily., Disp: , Rfl:    denosumab (PROLIA) 60 MG/ML SOSY injection, Inject 60 mg into the skin every 6 (six) months., Disp: , Rfl:    ELIQUIS 5 MG TABS tablet, TAKE 1  TABLET(5 MG) BY MOUTH TWICE DAILY, Disp: 60 tablet, Rfl: 5   esomeprazole (NEXIUM) 20 MG capsule, Take 20 mg by mouth daily at 12 noon. , Disp: , Rfl:    folic acid (FOLVITE) 1 MG tablet, Take by mouth., Disp: , Rfl:    Multiple Vitamins-Minerals (MULTIVITAMIN WITH MINERALS) tablet, Take 1 tablet by mouth daily., Disp: , Rfl:    Niacinamide-Zn-Cu-Methfo-Se-Cr (NICOTINAMIDE PO), Take 500 mg by mouth., Disp: , Rfl:    predniSONE (DELTASONE) 5 MG tablet, Take 5 mg by mouth daily with breakfast. Dr Dossie Der, Disp: , Rfl:    PREVIDENT 5000 PLUS 1.1 % CREA dental cream, Place 1 Application onto teeth as directed., Disp: , Rfl:       Objective:   Vitals:   03/31/22 1045  BP: 116/64  Pulse: 66  Temp: 97.7 F (36.5 C)  TempSrc: Oral  SpO2: 99%  Weight: 187 lb (84.8 kg)  Height: '5\' 8"'$  (1.727 m)    Estimated body mass index is  28.43 kg/m as calculated from the following:   Height as of this encounter: '5\' 8"'$  (1.727 m).   Weight as of this encounter: 187 lb (84.8 kg).  '@WEIGHTCHANGE'$ @  Filed Weights   03/31/22 1045  Weight: 187 lb (84.8 kg)     Physical Exam    General: No distress. Looks well Neuro: Alert and Oriented x 3. GCS 15. Speech normal Psych: Pleasant Resp:  Barrel Chest - no.  Wheeze - no, Crackles - YES BASE, No overt respiratory distress CVS: Normal heart sounds. Murmurs - no Ext: Stigmata of Connective Tissue Disease -  RA HEENT: Normal upper airway. PEERL +. No post nasal drip        Assessment:       ICD-10-CM   1. Interstitial lung disease due to connective tissue disease ([place])  J84.89 Pulmonary Function Test   M35.9     2. History of rheumatoid arthritis  Z87.39          Plan:     Patient Instructions     ICD-10-CM   1. Interstitial lung disease due to connective tissue disease ([place])  J84.89    M35.9     2. 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 However, stable on PFT betwee Nov 2022 - > Jan 2024   - I think immuran is helping control the ILD  Multiple small lung nodules probably due to rheumatoid arthritis (March 2022 last CT)]  - no change Aug 2023 and considered benign  Plan -continue immuran via Dr Dossie Der - Hold off on starting anti-fibrotic ofev/esbriet  - based on shared decision making -  ILD PRO registry visit followup visit 03/31/2022 -Do spirometry and DLCO in 6 months   Follow-up - Return in 6 months -but after breathing test   -Symptom score and walking desaturation test at follow-up   SIGNATURE    Dr. Brand Males, M.D., F.C.C.P,  Pulmonary and Critical Care Medicine Staff Physician, [place] Director - Interstitial  Lung Disease  Program  Pulmonary [place] [facility] [place], [place], [address]  Pager: [phone], If no answer or between  15:00h - 7:00h: call [phone] Telephone: [phone]  12:02  PM 03/31/2022

## 2022-03-31 NOTE — Patient Instructions (Addendum)
ICD-10-CM   1. Interstitial lung disease due to connective tissue disease (Nicholas Webb)  J84.89    M35.9     2. 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 However, stable on PFT betwee Nov 2022 - > Jan 2024   - I think immuran is helping control the ILD  Multiple small lung nodules probably due to rheumatoid arthritis (March 2022 last CT)]  - no change Aug 2023 and considered benign  Plan -continue immuran via Dr Dossie Der - Hold off on starting anti-fibrotic ofev/esbriet  - based on shared decision making -  ILD PRO registry visit followup visit 03/31/2022 -Do spirometry and DLCO in 6 months   Follow-up - Return in 6 months -but after breathing test   -Symptom score and walking desaturation test at follow-up

## 2022-03-31 NOTE — Progress Notes (Signed)
Spirometry/DLCO performed today.

## 2022-03-31 NOTE — Research (Signed)
Title: Chronic Fibrosing Interstitial Lung Disease with Progressive Phenotype Prospective Outcomes (ILD-PRO) Registry    Protocol #: IPF-PRO-SUB, Clinical Trials # [id number], Sponsor: [employer]   Protocol Version Amendment 4 dated 12Sep2019  and confirmed current on  Consent Version for today's visit date of  Is [place] [facility] Approved Version 03 Feb 2018 Revised 03 Feb 2018   Objectives:  Describe current approaches to diagnosis and treatment of chronic fibrosing ILDs with progressive phenotype  Describe the natural history of chronic fibrosing ILDs with progressive phenotype  Assess quality of life from self-administered participant reported questionnaires for each disease group  Describe participant interactions with the healthcare system, describe treatment practices across multiple institutions for each disease group  Collect biological samples linked to well characterized chronic fibrosing ILDs with progressive phenotype to identify disease biomarkers  Collect data and biological samples that will support future research studies.                                            Key Inclusion Criteria: Willing and able to provide informed consent  Age ? 30 years  Diagnosis of a non-IPF ILD of any duration, including, but not limited to Idiopathic Non-Specific Interstitial Pneumonia (INSIP), Unclassifiable Idiopathic Interstitial Pneumonias (IIPs), Interstitial Pneumonia with Autoimmune Features (IPAF), Autoimmune ILDs such as Rheumatoid Arthritis (RA-ILD) and Systemic Sclerosis (SSC-ILD), Chronic Hypersensitivity Pneumonitis (HP), Sarcoidosis or Exposure-related ILDs such as asbestosis.  Chronic fibrosing ILD defined by reticular abnormality with traction bronchiectasis with or without honeycombing confirmed by chest HRCT scan and/or lung biopsy.  Progressive phenotype as defined by fulfilling at least one of the criteria below of fibrotic changes (progression set point)  within the last 24 months regardless of treatment considered appropriate in individual ILDs:  decline in FVC % predicted (% pred) based on >10% relative decline  decline in FVC % pred based on ? 5 - <10% relative decline in FVC combined with worsening of respiratory symptoms as assessed by the site investigator  decline in FVC % pred based on ? 5 - <10% relative decline in FVC combined with increasing extent of fibrotic changes on chest imaging (HRCT scan) as assessed by the site investigator  decline in DLCO % pred based on ? 10% relative decline  worsening of respiratory symptoms as well as increasing extent of fibrotic changes on chest imaging (HRCT scan) as assessed by the site investigator independent of FVC change.     Key Exclusion Criteria: Malignancy, treated or untreated, other than skin or early stage prostate cancer, within the past 5 years  Currently listed for lung transplantation at the time of enrollment  Currently enrolled in a clinical trial at the time of enrollment in this registry       Clinical Research Coordinator / Research RN note : This visit for Winder [phone] with DOB:1940/07/24 on 30/Jan/2024  for the above protocol is Visit/Encounter 2 and is for purpose of research.    Subject expressed continued interest and consent in continuing as a study subject. Subject confirmed that there was no change in contact information (e.g. address, telephone, email). Subject thanked for participation in research and contribution to science.     During this visit on 30/Jan/2024  , the subject completed the blood work and questionnaires per the above referenced protocol. Please refer to the subject's paper source binder for further details.  Signed by [place] Coordinator [facility]  [place], [place] 30/Jan/2024  11:56am

## 2022-04-13 DIAGNOSIS — M810 Age-related osteoporosis without current pathological fracture: Secondary | ICD-10-CM | POA: Diagnosis not present

## 2022-04-24 DIAGNOSIS — H11153 Pinguecula, bilateral: Secondary | ICD-10-CM | POA: Diagnosis not present

## 2022-04-24 DIAGNOSIS — H43813 Vitreous degeneration, bilateral: Secondary | ICD-10-CM | POA: Diagnosis not present

## 2022-04-24 DIAGNOSIS — H52223 Regular astigmatism, bilateral: Secondary | ICD-10-CM | POA: Diagnosis not present

## 2022-04-24 DIAGNOSIS — H5203 Hypermetropia, bilateral: Secondary | ICD-10-CM | POA: Diagnosis not present

## 2022-04-24 DIAGNOSIS — Z961 Presence of intraocular lens: Secondary | ICD-10-CM | POA: Diagnosis not present

## 2022-04-24 DIAGNOSIS — H524 Presbyopia: Secondary | ICD-10-CM | POA: Diagnosis not present

## 2022-04-24 DIAGNOSIS — H18523 Epithelial (juvenile) corneal dystrophy, bilateral: Secondary | ICD-10-CM | POA: Diagnosis not present

## 2022-04-24 DIAGNOSIS — H35373 Puckering of macula, bilateral: Secondary | ICD-10-CM | POA: Diagnosis not present

## 2022-05-11 DIAGNOSIS — I2510 Atherosclerotic heart disease of native coronary artery without angina pectoris: Secondary | ICD-10-CM | POA: Diagnosis not present

## 2022-05-11 DIAGNOSIS — M0589 Other rheumatoid arthritis with rheumatoid factor of multiple sites: Secondary | ICD-10-CM | POA: Diagnosis not present

## 2022-05-11 DIAGNOSIS — M7551 Bursitis of right shoulder: Secondary | ICD-10-CM | POA: Diagnosis not present

## 2022-05-11 DIAGNOSIS — M8580 Other specified disorders of bone density and structure, unspecified site: Secondary | ICD-10-CM | POA: Diagnosis not present

## 2022-05-11 DIAGNOSIS — Z79899 Other long term (current) drug therapy: Secondary | ICD-10-CM | POA: Diagnosis not present

## 2022-05-11 DIAGNOSIS — M62838 Other muscle spasm: Secondary | ICD-10-CM | POA: Diagnosis not present

## 2022-05-11 DIAGNOSIS — E559 Vitamin D deficiency, unspecified: Secondary | ICD-10-CM | POA: Diagnosis not present

## 2022-05-11 DIAGNOSIS — M150 Primary generalized (osteo)arthritis: Secondary | ICD-10-CM | POA: Diagnosis not present

## 2022-05-13 DIAGNOSIS — Z79899 Other long term (current) drug therapy: Secondary | ICD-10-CM | POA: Diagnosis not present

## 2022-05-30 ENCOUNTER — Other Ambulatory Visit: Payer: Self-pay | Admitting: Cardiology

## 2022-05-30 DIAGNOSIS — I2510 Atherosclerotic heart disease of native coronary artery without angina pectoris: Secondary | ICD-10-CM

## 2022-06-11 DIAGNOSIS — G4733 Obstructive sleep apnea (adult) (pediatric): Secondary | ICD-10-CM | POA: Diagnosis not present

## 2022-06-12 DIAGNOSIS — D485 Neoplasm of uncertain behavior of skin: Secondary | ICD-10-CM | POA: Diagnosis not present

## 2022-06-12 DIAGNOSIS — Z08 Encounter for follow-up examination after completed treatment for malignant neoplasm: Secondary | ICD-10-CM | POA: Diagnosis not present

## 2022-06-12 DIAGNOSIS — L821 Other seborrheic keratosis: Secondary | ICD-10-CM | POA: Diagnosis not present

## 2022-06-12 DIAGNOSIS — C44619 Basal cell carcinoma of skin of left upper limb, including shoulder: Secondary | ICD-10-CM | POA: Diagnosis not present

## 2022-06-12 DIAGNOSIS — C44319 Basal cell carcinoma of skin of other parts of face: Secondary | ICD-10-CM | POA: Diagnosis not present

## 2022-06-12 DIAGNOSIS — Z85828 Personal history of other malignant neoplasm of skin: Secondary | ICD-10-CM | POA: Diagnosis not present

## 2022-06-12 DIAGNOSIS — L570 Actinic keratosis: Secondary | ICD-10-CM | POA: Diagnosis not present

## 2022-06-12 DIAGNOSIS — D1801 Hemangioma of skin and subcutaneous tissue: Secondary | ICD-10-CM | POA: Diagnosis not present

## 2022-06-12 DIAGNOSIS — L814 Other melanin hyperpigmentation: Secondary | ICD-10-CM | POA: Diagnosis not present

## 2022-06-12 NOTE — Progress Notes (Unsigned)
[name], PhD, LAT, ATC acting as a scribe for [name], MD.  Subjective:    CC: Left shoulder pain  HPI: Patient is an 82 year old male presenting with left shoulder pain ongoing over several months. MOI:sx started after reaching overhead to unscrew a light bulb.  Patient locates pain to anterior aspect of the shoulder. Difficulty holding things in the left hand d/t pain, RHD.   Neck pain: yes Radiates: yes, to the wrist UE Numbness/tingling: no UE Weakness: yes, weakness and decreased grip strength Aggravates: grip/twist, reaching Treatments tried: carpal tunnel braces, Tylenol  Dx imaging: 07/04/2021 C-spine MRI  Pertinent review of Systems: no fever or chills  Relevant historical information: Previous IVC filter.  History thumb arthritis seen by Dr. Merlyn Lot   Objective:    Vitals:   06/15/22 0833  BP: 122/72  Pulse: 67  SpO2: 95%   General: Well Developed, well nourished, and in no acute distress.   MSK: Left shoulder: Normal-appearing Nontender. Range of motion reduced abduction 100 degrees.  External rotation 20 degrees beyond neutral position internal rotation full. Strength reduced abduction 4/5 reduced external rotation 4/5 intact internal rotation 5/5. Positive Hawkins and Neer's test. Positive empty can test. Mildly positive Yergason's and speeds test. Capillary refill and pulses intact distally.  Lab and Radiology Results  Procedure: Real-time Ultrasound Guided Injection of left shoulder glenohumeral joint posterior approach Device: Philips Affiniti 50G Images permanently stored and available for review in PACS Ultrasound evaluation prior to injection reveals diminished supraspinatus and infraspinatus tendons without definitive tear seen on ultrasound. Verbal informed consent obtained.  Discussed risks and benefits of procedure. Warned about infection, bleeding, hyperglycemia damage to structures among others. Patient expresses understanding  and agreement Time-out conducted.   Noted no overlying erythema, induration, or other signs of local infection.   Skin prepped in a sterile fashion.   Local anesthesia: Topical Ethyl chloride.   With sterile technique and under real time ultrasound guidance: 40 mg of Kenalog and 2 mL Marcaine injected into glenohumeral joint posterior approach. Fluid seen entering the joint capsule.   Completed without difficulty   Pain moderately resolved suggesting accurate placement of the medication.   Advised to call if fevers/chills, erythema, induration, drainage, or persistent bleeding.   Images permanently stored and available for review in the ultrasound unit.  Impression: Technically successful ultrasound guided injection.    X-ray images left shoulder obtained today personally and independently interpreted High riding humeral head.  No severe glenohumeral DJD.  No acute fractures are visible. Await formal radiology review    Impression and Recommendations:    Assessment and Plan: 82 y.o. male with left shoulder pain thought to be due to rotator cuff impingement.  There may be a chronic rotator cuff tear that I did not see well on ultrasound which could be contributory.  He would like to avoid surgery which I think is reasonable.  Plan for trial of glenohumeral injection and physical therapy.  Recheck in 6 weeks.  PDMP not reviewed this encounter. Orders Placed This Encounter  Procedures   [place] LIMITED JOINT SPACE STRUCTURES UP LEFT(NO LINKED CHARGES)    Order Specific Question:   Reason for Exam (SYMPTOM  OR DIAGNOSIS REQUIRED)    Answer:   left shoulder pain    Order Specific Question:   Preferred imaging location?    Answer:   [facility] Shoulder Left    Standing Status:   Future    Standing Expiration Date:  07/15/2022    Order Specific Question:   Reason for Exam (SYMPTOM  OR DIAGNOSIS REQUIRED)    Answer:   left shoulder pain    Order Specific  Question:   Preferred imaging location?    Answer:   [facility]   Ambulatory referral to Physical Therapy    Referral Priority:   Routine    Referral Type:   Physical Medicine    Referral Reason:   Specialty Services Required    Requested Specialty:   Physical Therapy    Number of Visits Requested:   1   No orders of the defined types were placed in this encounter.   Discussed warning signs or symptoms. Please see discharge instructions. Patient expresses understanding.   The above documentation has been reviewed and is accurate and complete [name], M.D.

## 2022-06-15 ENCOUNTER — Other Ambulatory Visit: Payer: Self-pay

## 2022-06-15 ENCOUNTER — Encounter: Payer: Self-pay | Admitting: Family Medicine

## 2022-06-15 ENCOUNTER — Ambulatory Visit (INDEPENDENT_AMBULATORY_CARE_PROVIDER_SITE_OTHER): Payer: Medicare PPO

## 2022-06-15 ENCOUNTER — Ambulatory Visit: Payer: Medicare PPO | Admitting: Family Medicine

## 2022-06-15 VITALS — BP 122/72 | HR 67 | Ht 68.0 in | Wt 191.7 lb

## 2022-06-15 DIAGNOSIS — M25512 Pain in left shoulder: Secondary | ICD-10-CM

## 2022-06-15 NOTE — Patient Instructions (Addendum)
Thank you for coming in today.   Please get an Xray today before you leave   You received an injection today. Seek immediate medical attention if the joint becomes red, extremely painful, or is oozing fluid.   Check back in 6 weeks 

## 2022-06-16 NOTE — Progress Notes (Signed)
Left shoulder x-ray shows some minimal arthritis changes.

## 2022-07-02 DIAGNOSIS — C44319 Basal cell carcinoma of skin of other parts of face: Secondary | ICD-10-CM | POA: Diagnosis not present

## 2022-07-03 ENCOUNTER — Ambulatory Visit: Payer: Medicare PPO | Admitting: Physical Therapy

## 2022-07-13 ENCOUNTER — Encounter: Payer: Self-pay | Admitting: Podiatry

## 2022-07-13 ENCOUNTER — Ambulatory Visit: Payer: Medicare PPO | Admitting: Podiatry

## 2022-07-13 DIAGNOSIS — M79676 Pain in unspecified toe(s): Secondary | ICD-10-CM | POA: Diagnosis not present

## 2022-07-13 DIAGNOSIS — B351 Tinea unguium: Secondary | ICD-10-CM | POA: Diagnosis not present

## 2022-07-13 DIAGNOSIS — D689 Coagulation defect, unspecified: Secondary | ICD-10-CM

## 2022-07-13 NOTE — Progress Notes (Signed)
This patient returns to my office for at risk foot care.  This patient requires this care by a professional since this patient will be at risk due to having coagulation defect.  Patient is taking eliquis.  This patient is unable to cut nails himself since the patient cannot reach his nails.These nails are painful walking and wearing shoes.  This patient presents for at risk foot care today.  General Appearance  Alert, conversant and in no acute stress.  Vascular  Dorsalis pedis and posterior tibial  pulses are palpable  bilaterally.  Capillary return is within normal limits  bilaterally. Temperature is within normal limits  bilaterally.  Neurologic  Senn-Weinstein monofilament wire test within normal limits  bilaterally. Muscle power within normal limits bilaterally.  Nails Thick disfigured discolored nails with subungual debris  from hallux to fifth toes bilaterally. No evidence of bacterial infection or drainage bilaterally.  Orthopedic  No limitations of motion  feet .  No crepitus or effusions noted.  No bony pathology or digital deformities noted.  DJD 1st MPJ right foot. Bony prominence medial aspect both rearfeet.  Pes planus right foot.  Skin  normotropic skin with no porokeratosis noted bilaterally.  No signs of infections or ulcers noted.     Onychomycosis  Pain in right toes  Pain in left toes  Consent was obtained for treatment procedures.   Mechanical debridement of nails 1-5  bilaterally performed with a nail nipper.  Filed with dremel without incident.    Return office visit    3 months                 Told patient to return for periodic foot care and evaluation due to potential at risk complications.   Helane Gunther DPM

## 2022-07-15 ENCOUNTER — Ambulatory Visit: Payer: Medicare PPO | Admitting: Physical Therapy

## 2022-07-15 NOTE — Progress Notes (Unsigned)
   [name], PhD, LAT, ATC acting as a scribe for Nicholas Graham, MD.  Nicholas Webb is a 82 y.o. male who presents to [facility] today for 36-month f/u L shoulder pain. Pt was last seen by Dr. Denyse Amass on 06/15/22 and was given a L GH steroid injection and was referred to PT, however he canceled all scheduled visits. Today, pt reports ***  Dx imaging: 06/15/22 L shoulder XR 07/04/2021 C-spine MRI   Pertinent review of systems: ***  Relevant historical information: ***   Exam:  There were no vitals taken for this visit. General: Well Developed, well nourished, and in no acute distress.   MSK: ***    Lab and Radiology Results No results found for this or any previous visit (from the past 72 hour(s)). No results found.     Assessment and Plan: 82 y.o. male with ***   PDMP not reviewed this encounter. No orders of the defined types were placed in this encounter.  No orders of the defined types were placed in this encounter.    Discussed warning signs or symptoms. Please see discharge instructions. Patient expresses understanding.   ***

## 2022-07-16 ENCOUNTER — Ambulatory Visit: Payer: Medicare PPO | Admitting: Family Medicine

## 2022-07-16 ENCOUNTER — Encounter: Payer: Self-pay | Admitting: Family Medicine

## 2022-07-16 ENCOUNTER — Ambulatory Visit: Payer: Medicare PPO | Admitting: Physical Therapy

## 2022-07-16 VITALS — BP 122/70 | HR 75 | Ht 68.0 in | Wt 192.0 lb

## 2022-07-16 DIAGNOSIS — G8929 Other chronic pain: Secondary | ICD-10-CM | POA: Diagnosis not present

## 2022-07-16 DIAGNOSIS — M25512 Pain in left shoulder: Secondary | ICD-10-CM | POA: Diagnosis not present

## 2022-07-16 NOTE — Patient Instructions (Signed)
Thank you for coming in today.   We can do the injection at the 3 month mark (mid July).   Let me know what you need.   Proceed to PT.

## 2022-07-20 ENCOUNTER — Encounter: Payer: Self-pay | Admitting: Physical Therapy

## 2022-07-20 ENCOUNTER — Ambulatory Visit: Payer: Medicare PPO | Admitting: Physical Therapy

## 2022-07-20 DIAGNOSIS — M25512 Pain in left shoulder: Secondary | ICD-10-CM

## 2022-07-20 DIAGNOSIS — M6281 Muscle weakness (generalized): Secondary | ICD-10-CM | POA: Diagnosis not present

## 2022-07-20 NOTE — Therapy (Signed)
OUTPATIENT PHYSICAL THERAPY SHOULDER EVALUATION   Patient Name: Nicholas Webb MRN: [id number] DOB:1940-05-30, 82 y.o., male Today's Date: 07/20/2022  END OF SESSION:  PT End of Session - 07/20/22 1258     Visit Number 1    Number of Visits 8    Date for PT Re-Evaluation 09/14/22    Authorization Type Francine Graven - auth requested    PT Start Time 1301    PT Stop Time 1338    PT Time Calculation (min) 37 min    Activity Tolerance Patient tolerated treatment well    Behavior During Therapy [facility] for tasks assessed/performed             Past Medical History:  Diagnosis Date   Anemia of chronic disease    Atherosclerosis of both carotid arteries    Atypical mole 03/28/2013   moderate atypia on mid back   Barrett's esophagus    Basal cell carcinoma 12/17/2015   superficial on right postauricular - CX3+5FU   Dizziness    GERD (gastroesophageal reflux disease)    Hx of adenomatous colonic polyps    Hyperlipidemia    Macrocytosis without anemia    Obesity    OSA (obstructive sleep apnea)    Rheumatoid arthritis(714.0)    Squamous cell carcinoma of skin 02/25/2010   in situ on left sideburn, inferior - CX3+5FU   Squamous cell carcinoma of skin 02/25/2010   well differentiated on right sideburn - CX3+5FU   Squamous cell carcinoma of skin 03/24/2011   in situ on right temple - CX3+5FU   Squamous cell carcinoma of skin 03/24/2011   in situ on right jawline - CX3+5FU   Squamous cell carcinoma of skin 03/24/2011   in situ on right angle of jaw - CX3+5FU   Squamous cell carcinoma of skin 05/05/2011   left temple - CX3+5FU   Squamous cell carcinoma of skin 11/28/2013   in situ on mid scalp - tx p bx   Squamous cell carcinoma of skin 12/17/2015   in situ on right post crown - CX3+5FU   Squamous cell carcinoma of skin 12/17/2015   verruca vulgaris with squamous atypia on right thumb - CX3+5FU   Squamous cell carcinoma of skin 01/03/2016   in situ on right crown post inf    Squamous cell carcinoma of skin 04/22/2016   well differentiated on left outer low leg   Squamous cell carcinoma of skin 10/06/2016   in situ on right subauricular - CX3+Cautery+5FU   Squamous cell carcinoma of skin 12/22/2016   in situ on mid post crown - tx p bx   Squamous cell carcinoma of skin 01/11/2018   in situ on right crown - tx p bx   Squamous cell carcinoma of skin 01/11/2018   in situ on mid crown - tx p bx   Squamous cell carcinoma of skin 01/11/2018   in situ on left crown - tx p bx   Squamous cell carcinoma of skin 01/11/2018   well differentiated on right temple - tx p bx   Squamous cell carcinoma of skin 03/07/2019   well differentiated on right neck - tx p bx   Squamous cell carcinoma of skin 03/07/2019   in situ on right temple - tx p bx   Squamous cell carcinoma of skin 03/07/2019   in situ on mid forehead - tx p bx   Squamous cell carcinoma of skin 01/04/2020   well diff- mid parietal scalp (CX35FU)   Squamous cell carcinoma  of skin 01/04/2020   in situ- right scalp superior (CX35FU)   Squamous cell carcinoma of skin 01/04/2020   in situ-legt parietal scalp (CX35FU)    Syncope    Past Surgical History:  Procedure Laterality Date   CORONARY STENT INTERVENTION N/A 06/24/2018   Procedure: CORONARY STENT INTERVENTION;  Surgeon: Kathleene Hazel, MD;  Location: [facility]AS[facility];  Service: Cardiovascular;  Laterality: N/A;   IR IVC FILTER PLMT / S&I /IMG GUID/MOD SED  10/07/2018   IR IVC FILTER RETRIEVAL / S&I /IMG GUID/MOD SED  06/15/2019   IR RADIOLOGIST EVAL & MGMT  09/27/2018   IR RADIOLOGIST EVAL & MGMT  01/04/2019   JOINT REPLACEMENT  2004   left   LEFT HEART CATH AND CORONARY ANGIOGRAPHY N/A 06/24/2018   Procedure: LEFT HEART CATH AND CORONARY ANGIOGRAPHY;  Surgeon: Kathleene Hazel, MD;  Location: [facility] INVASIVE CV LAB;  Service: Cardiovascular;  Laterality: N/A;   Patient Active Problem List   Diagnosis Date Noted   Pulmonary fibrosis (HCC)  07/17/2021   Right bundle branch block 07/17/2021   Coagulation disorder (HCC) 01/17/2019   DVT, lower extremity, distal, acute, left (HCC) 07/14/2018   Coronary artery disease involving native coronary artery of native heart without angina pectoris 07/14/2018   Wide-complex tachycardia 07/14/2018   Bilateral pulmonary embolism (HCC) 07/14/2018   Brain mass    NSTEMI (non-ST elevated myocardial infarction) (HCC) 06/23/2018   GERD (gastroesophageal reflux disease) 06/23/2018   Generalized OA 06/23/2018   HLD (hyperlipidemia) 06/23/2018   BPPV (benign paroxysmal positional vertigo), left 12/20/2015   Syncope    Dizziness     PCP: Camie Patience, FNP  REFERRING PROVIDER: Rodolph Bong, MD  REFERRING DIAG: M25.512 (ICD-10-CM) - Left shoulder pain, unspecified chronicity  THERAPY DIAG:  Acute pain of left shoulder  Muscle weakness (generalized)  Rationale for Evaluation and Treatment: Rehabilitation  ONSET DATE: 2 years ago - after reaching up to change light bulds  SUBJECTIVE:                                                                                                                                                                                      SUBJECTIVE STATEMENT: States that about 2 years ago he was changing light bulbs overhead with a reacher and his shoulder started to bother him.  He was having difficulty lifting his arm and recently saw the doctor who gave him an injection and his pain completely went away.  He does not do any exercises for shoulder and would like to develop home exercise program so pain does not return.   Reports he does have pain in his  hands due to arthritis and gets injections for those as well, currently wearing braces on both hands.  Hand dominance: Right  PERTINENT HISTORY: GERD, CAD- mild heart attack April 2020, left TKA 2006  PAIN:  Are you having pain? no  PRECAUTIONS: None  WEIGHT BEARING RESTRICTIONS: No  FALLS:  Has  patient fallen in last 6 months? No   PLOF: Independent  PATIENT GOALS: To establish home exercise program to work on  NEXT MD VISIT:   OBJECTIVE:   DIAGNOSTIC FINDINGS:  Xray left shoulder 06/15/22 FINDINGS: Minimal peripheral glenoid and acromioclavicular joint degenerative spurring. No significant joint space narrowing. No acute fracture or dislocation. The visualized portion of the left lung is unremarkable.   IMPRESSION: Minimal glenohumeral and acromioclavicular osteoarthritis.  PATIENT SURVEYS:  FOTO 50%  COGNITION: Overall cognitive status: Within functional limits for tasks assessed      POSTURE: Increased thoracic kyphosis, slumped posture, rounded shoulders, forward head, sacral sitting    UE Measurements Upper Extremity Right 07/20/2022 Left 07/20/2022   A/PROM MMT A/PROM MMT  Shoulder Flexion 160 4+ 155 4  Shoulder Extension      Shoulder Abduction WFL 4 WFL 4-**  Shoulder Adduction      Shoulder Internal Rotation WFL 4+ WFL 4+  Shoulder External Rotation WFL 4 WFL 3+*  Elbow Flexion      Elbow Extension      Wrist Flexion      Wrist Extension      Wrist Supination      Wrist Pronation      Wrist Ulnar Deviation      Wrist Radial Deviation      Grip Strength NA  NA     (Blank rows = not tested)   * crunchy noise ** pain    JOINT MOBILITY TESTING:  Hypomobility with PA left greater than right  PALPATION:  No significant tenderness to palpation increased resting tone in left back and biceps   TODAY'S TREATMENT:                                                                                                                                         DATE:  07/20/2022  Therapeutic Exercise:  Aerobic: Supine: Shoulder flexion with RTB x 20 bilaterally Side-lying: Shoulder external rotation x 20 left  Seated: Shoulder external rotation x 20 bilaterally  Standing: Pec stretch at wall with shoulder at 90 degrees x 510-second holds  left Neuromuscular Re-education: Manual Therapy: Therapeutic Activity: Self Care: Trigger Point Dry Needling:  Modalities:    PATIENT EDUCATION:  Education details: on current presentation, on HEP, on clinical outcomes score and POC Person educated: Patient Education method: Programmer, multimedia, Demonstration, and Handouts Education comprehension: verbalized understanding   HOME EXERCISE PROGRAM: [id number]  ASSESSMENT:  CLINICAL IMPRESSION: Patient presents to physical therapy with complaints of recent left shoulder pain and weakness in left shoulder.  Patient demonstrates poor strength  and posture that are likely contributing to current condition.  Session focused on education as well as benefits of developing strong home exercise program.  Answered all questions and patient would greatly benefit from skilled physical therapy to reduce risk of reinjury in left shoulder.  OBJECTIVE IMPAIRMENTS: decreased ROM, decreased strength, postural dysfunction, and pain.   ACTIVITY LIMITATIONS: lifting and reach over head  PARTICIPATION LIMITATIONS: yard work  PERSONAL FACTORS: Age and Time since onset of injury/illness/exacerbation are also affecting patient's functional outcome.   REHAB POTENTIAL: Good  CLINICAL DECISION MAKING: Stable/uncomplicated  EVALUATION COMPLEXITY: Low   GOALS: Goals reviewed with patient? yes  SHORT TERM GOALS: Target date: 08/17/2022  Patient will be independent in self management strategies to improve quality of life and functional outcomes. Baseline: New Program Goal status: INITIAL  2.  Patient will report at least 50% improvement in overall symptoms and/or function to demonstrate improved functional mobility Baseline: 0% better Goal status: INITIAL  3.  Patient will demonstrate pain-free MMT in left shoulder to improve tolerance to manual muscle testing. Baseline: Painful Goal status: INITIAL     LONG TERM GOALS: Target date: 09/14/2022   Patient  will report at least 75% improvement in overall symptoms and/or function to demonstrate improved functional mobility Baseline: 0% better Goal status: INITIAL  2.  Patient will improve score on FOTO outcomes measure to projected score to demonstrate overall improved function and QOL Baseline: see above Goal status: INITIAL  3.  Patient will demonstrate at least 4 out of 5 MMT in bilateral upper extremities to reduce risk of reinjury Baseline: See above Goal status: INITIAL   PLAN:  PT FREQUENCY: 1-2x/week for total of 8 visits over 8 week certification period  PT DURATION: 8 weeks  PLANNED INTERVENTIONS: Therapeutic exercises, Therapeutic activity, Neuromuscular re-education, Balance training, Gait training, Patient/Family education, Self Care, Joint mobilization, Joint manipulation, Stair training, Vestibular training, Canalith repositioning, Orthotic/Fit training, DME instructions, Aquatic Therapy, Dry Needling, Electrical stimulation, Spinal manipulation, Spinal mobilization, Cryotherapy, Moist heat, Taping, Traction, Ultrasound, Fluidotherapy, Ionotophoresis 4mg /ml Dexamethasone, Manual therapy, and Re-evaluation  PLAN FOR NEXT SESSION: Shoulder strengthening, posture, anterior stretching   4:18 PM, 07/20/22 Tereasa Coop, DPT Physical Therapy with [facility]

## 2022-07-21 DIAGNOSIS — L989 Disorder of the skin and subcutaneous tissue, unspecified: Secondary | ICD-10-CM | POA: Diagnosis not present

## 2022-07-21 DIAGNOSIS — L570 Actinic keratosis: Secondary | ICD-10-CM | POA: Diagnosis not present

## 2022-07-21 DIAGNOSIS — C44619 Basal cell carcinoma of skin of left upper limb, including shoulder: Secondary | ICD-10-CM | POA: Diagnosis not present

## 2022-07-30 ENCOUNTER — Other Ambulatory Visit: Payer: Self-pay | Admitting: Oncology

## 2022-08-03 ENCOUNTER — Ambulatory Visit: Payer: Medicare PPO | Admitting: Physical Therapy

## 2022-08-03 ENCOUNTER — Encounter: Payer: Self-pay | Admitting: Physical Therapy

## 2022-08-03 DIAGNOSIS — M25512 Pain in left shoulder: Secondary | ICD-10-CM

## 2022-08-03 DIAGNOSIS — M6281 Muscle weakness (generalized): Secondary | ICD-10-CM

## 2022-08-03 NOTE — Therapy (Signed)
OUTPATIENT PHYSICAL THERAPY SHOULDER TREATMENT   Patient Name: Nicholas Webb MRN: [id number] DOB:1940/04/09, 82 y.o., male Today's Date: 08/03/2022  END OF SESSION:  PT End of Session - 08/03/22 1215     Visit Number 2    Number of Visits 8    Date for PT Re-Evaluation 09/14/22    Authorization Type humana - 8 visits approved from 5/20 to 7/15    Authorization - Visit Number 2    Authorization - Number of Visits 8    PT Start Time 1216    PT Stop Time 1256    PT Time Calculation (min) 40 min    Activity Tolerance Patient tolerated treatment well    Behavior During Therapy [facility] for tasks assessed/performed              Past Medical History:  Diagnosis Date   Anemia of chronic disease    Atherosclerosis of both carotid arteries    Atypical mole 03/28/2013   moderate atypia on mid back   Barrett's esophagus    Basal cell carcinoma 12/17/2015   superficial on right postauricular - CX3+5FU   Dizziness    GERD (gastroesophageal reflux disease)    Hx of adenomatous colonic polyps    Hyperlipidemia    Macrocytosis without anemia    Obesity    OSA (obstructive sleep apnea)    Rheumatoid arthritis(714.0)    Squamous cell carcinoma of skin 02/25/2010   in situ on left sideburn, inferior - CX3+5FU   Squamous cell carcinoma of skin 02/25/2010   well differentiated on right sideburn - CX3+5FU   Squamous cell carcinoma of skin 03/24/2011   in situ on right temple - CX3+5FU   Squamous cell carcinoma of skin 03/24/2011   in situ on right jawline - CX3+5FU   Squamous cell carcinoma of skin 03/24/2011   in situ on right angle of jaw - CX3+5FU   Squamous cell carcinoma of skin 05/05/2011   left temple - CX3+5FU   Squamous cell carcinoma of skin 11/28/2013   in situ on mid scalp - tx p bx   Squamous cell carcinoma of skin 12/17/2015   in situ on right post crown - CX3+5FU   Squamous cell carcinoma of skin 12/17/2015   verruca vulgaris with squamous atypia on right thumb -  CX3+5FU   Squamous cell carcinoma of skin 01/03/2016   in situ on right crown post inf   Squamous cell carcinoma of skin 04/22/2016   well differentiated on left outer low leg   Squamous cell carcinoma of skin 10/06/2016   in situ on right subauricular - CX3+Cautery+5FU   Squamous cell carcinoma of skin 12/22/2016   in situ on mid post crown - tx p bx   Squamous cell carcinoma of skin 01/11/2018   in situ on right crown - tx p bx   Squamous cell carcinoma of skin 01/11/2018   in situ on mid crown - tx p bx   Squamous cell carcinoma of skin 01/11/2018   in situ on left crown - tx p bx   Squamous cell carcinoma of skin 01/11/2018   well differentiated on right temple - tx p bx   Squamous cell carcinoma of skin 03/07/2019   well differentiated on right neck - tx p bx   Squamous cell carcinoma of skin 03/07/2019   in situ on right temple - tx p bx   Squamous cell carcinoma of skin 03/07/2019   in situ on mid forehead - tx  p bx   Squamous cell carcinoma of skin 01/04/2020   well diff- mid parietal scalp (CX35FU)   Squamous cell carcinoma of skin 01/04/2020   in situ- right scalp superior (CX35FU)   Squamous cell carcinoma of skin 01/04/2020   in situ-legt parietal scalp (CX35FU)    Syncope    Past Surgical History:  Procedure Laterality Date   CORONARY STENT INTERVENTION N/A 06/24/2018   Procedure: CORONARY STENT INTERVENTION;  Surgeon: Kathleene Hazel, MD;  Location: [facility];  Service: Cardiovascular;  Laterality: N/A;   IR IVC FILTER PLMT / S&I /IMG GUID/MOD SED  10/07/2018   IR IVC FILTER RETRIEVAL / S&I /IMG GUID/MOD SED  06/15/2019   IR RADIOLOGIST EVAL & MGMT  09/27/2018   IR RADIOLOGIST EVAL & MGMT  01/04/2019   JOINT REPLACEMENT  2004   left   LEFT HEART CATH AND CORONARY ANGIOGRAPHY N/A 06/24/2018   Procedure: LEFT HEART CATH AND CORONARY ANGIOGRAPHY;  Surgeon: Kathleene Hazel, MD;  Location: [facility];  Service: Cardiovascular;  Laterality:  N/A;   Patient Active Problem List   Diagnosis Date Noted   Pulmonary fibrosis (HCC) 07/17/2021   Right bundle branch block 07/17/2021   Coagulation disorder (HCC) 01/17/2019   DVT, lower extremity, distal, acute, left (HCC) 07/14/2018   Coronary artery disease involving native coronary artery of native heart without angina pectoris 07/14/2018   Wide-complex tachycardia 07/14/2018   Bilateral pulmonary embolism (HCC) 07/14/2018   Brain mass    NSTEMI (non-ST elevated myocardial infarction) (HCC) 06/23/2018   GERD (gastroesophageal reflux disease) 06/23/2018   Generalized OA 06/23/2018   HLD (hyperlipidemia) 06/23/2018   BPPV (benign paroxysmal positional vertigo), left 12/20/2015   Syncope    Dizziness     PCP: Camie Patience, FNP  REFERRING PROVIDER: Rodolph Bong, MD  REFERRING DIAG: M25.512 (ICD-10-CM) - Left shoulder pain, unspecified chronicity  THERAPY DIAG:  Acute pain of left shoulder  Muscle weakness (generalized)  Rationale for Evaluation and Treatment: Rehabilitation  ONSET DATE: 2 years ago - after reaching up to change light bulds  SUBJECTIVE:                                                                                                                                                                                      SUBJECTIVE STATEMENT: 08/03/2022 States that he had a skin lesion removed a skin leison for biopsy for skin cancer and had stitches along his left shoulder and could not exercises. Stitches removed today. Skin cancer came back negative. Reports no pain in his shoulder. State he tried to do his exercises but it was pulling on  his incision.  Eval: States that about 2 years ago he was changing light bulbs overhead with a reacher and his shoulder started to bother him.  He was having difficulty lifting his arm and recently saw the doctor who gave him an injection and his pain completely went away.  He does not do any exercises for shoulder and would  like to develop home exercise program so pain does not return.   Reports he does have pain in his hands due to arthritis and gets injections for those as well, currently wearing braces on both hands.  Hand dominance: Right  PERTINENT HISTORY: GERD, CAD- mild heart attack April 2020, left TKA 2006  PAIN:  Are you having pain? no  PRECAUTIONS: None  WEIGHT BEARING RESTRICTIONS: No  FALLS:  Has patient fallen in last 6 months? No   PLOF: Independent  PATIENT GOALS: To establish home exercise program to work on  NEXT MD VISIT:   OBJECTIVE:   DIAGNOSTIC FINDINGS:  Xray left shoulder 06/15/22 FINDINGS: Minimal peripheral glenoid and acromioclavicular joint degenerative spurring. No significant joint space narrowing. No acute fracture or dislocation. The visualized portion of the left lung is unremarkable.   IMPRESSION: Minimal glenohumeral and acromioclavicular osteoarthritis.  PATIENT SURVEYS:  FOTO 50%  COGNITION: Overall cognitive status: Within functional limits for tasks assessed      POSTURE: Increased thoracic kyphosis, slumped posture, rounded shoulders, forward head, sacral sitting    UE Measurements Upper Extremity Right eval Left eval   A/PROM MMT A/PROM MMT  Shoulder Flexion 160 4+ 155 4  Shoulder Extension      Shoulder Abduction WFL 4 WFL 4-**  Shoulder Adduction      Shoulder Internal Rotation WFL 4+ WFL 4+  Shoulder External Rotation WFL 4 WFL 3+*  Elbow Flexion      Elbow Extension      Wrist Flexion      Wrist Extension      Wrist Supination      Wrist Pronation      Wrist Ulnar Deviation      Wrist Radial Deviation      Grip Strength NA  NA     (Blank rows = not tested)   * crunchy noise ** pain    JOINT MOBILITY TESTING:  Hypomobility with PA left greater than right  PALPATION:  No significant tenderness to palpation increased resting tone in left back and biceps   TODAY'S TREATMENT:                                                                                                                                          DATE:  08/03/2022  Therapeutic Exercise:  Aerobic: Supine: horizontal shoulder abd 2 minutes B, Shoulder flexion with cane x2 rounds of 1 minute,  bilaterally, bench press x2 1 minute bouts  Side-lying: Shoulder external rotation x2 rounds of 1 minute bouts left  Seated: Shoulder external rotation x 2 bouts of 1 minute B  Standing: Pec stretch at wall with shoulder at 90 degrees x 15-second holds left Neuromuscular Re-education: Manual Therapy: Therapeutic Activity: Self Care: Trigger Point Dry Needling:  Modalities:    PATIENT EDUCATION:  Education details: on HEP, on modifications for exercises to reduce stain on front of shoulder (recent incision for skin biopsy), on sititng posture arms on lap and not under armpits Person educated: Patient Education method: Explanation, Demonstration, and Handouts Education comprehension: verbalized understanding   HOME EXERCISE PROGRAM: [id number]  ASSESSMENT:  CLINICAL IMPRESSION: 08/03/2022 Session focused on review of exercises and making modifications to reduce stress on front of left shoulder where recent biopsy was. Tolerated exercises with modifications well. Did not add to HEP secondary to poor adherence to HEP since evaluation. Overall tolerated session well and will continue to benefit from skilled PT at this time.   Eval: Patient presents to physical therapy with complaints of recent left shoulder pain and weakness in left shoulder.  Patient demonstrates poor strength and posture that are likely contributing to current condition.  Session focused on education as well as benefits of developing strong home exercise program.  Answered all questions and patient would greatly benefit from skilled physical therapy to reduce risk of reinjury in left shoulder.  OBJECTIVE IMPAIRMENTS: decreased ROM, decreased strength, postural dysfunction, and pain.    ACTIVITY LIMITATIONS: lifting and reach over head  PARTICIPATION LIMITATIONS: yard work  PERSONAL FACTORS: Age and Time since onset of injury/illness/exacerbation are also affecting patient's functional outcome.   REHAB POTENTIAL: Good  CLINICAL DECISION MAKING: Stable/uncomplicated  EVALUATION COMPLEXITY: Low   GOALS: Goals reviewed with patient? yes  SHORT TERM GOALS: Target date: 08/17/22  Patient will be independent in self management strategies to improve quality of life and functional outcomes. Baseline: New Program Goal status: INITIAL  2.  Patient will report at least 50% improvement in overall symptoms and/or function to demonstrate improved functional mobility Baseline: 0% better Goal status: INITIAL  3.  Patient will demonstrate pain-free MMT in left shoulder to improve tolerance to manual muscle testing. Baseline: Painful Goal status: INITIAL     LONG TERM GOALS: Target date: 09/14/22   Patient will report at least 75% improvement in overall symptoms and/or function to demonstrate improved functional mobility Baseline: 0% better Goal status: INITIAL  2.  Patient will improve score on FOTO outcomes measure to projected score to demonstrate overall improved function and QOL Baseline: see above Goal status: INITIAL  3.  Patient will demonstrate at least 4 out of 5 MMT in bilateral upper extremities to reduce risk of reinjury Baseline: See above Goal status: INITIAL   PLAN:  PT FREQUENCY: 1-2x/week for total of 8 visits over 8 week certification period  PT DURATION: 8 weeks  PLANNED INTERVENTIONS: Therapeutic exercises, Therapeutic activity, Neuromuscular re-education, Balance training, Gait training, Patient/Family education, Self Care, Joint mobilization, Joint manipulation, Stair training, Vestibular training, Canalith repositioning, Orthotic/Fit training, DME instructions, Aquatic Therapy, Dry Needling, Electrical stimulation, Spinal manipulation,  Spinal mobilization, Cryotherapy, Moist heat, Taping, Traction, Ultrasound, Fluidotherapy, Ionotophoresis 4mg /ml Dexamethasone, Manual therapy, and Re-evaluation  PLAN FOR NEXT SESSION: Shoulder strengthening, posture, anterior stretching   1:23 PM, 08/03/22 Tereasa Coop, DPT Physical Therapy with [facility]

## 2022-08-13 DIAGNOSIS — I2510 Atherosclerotic heart disease of native coronary artery without angina pectoris: Secondary | ICD-10-CM | POA: Diagnosis not present

## 2022-08-13 DIAGNOSIS — M79641 Pain in right hand: Secondary | ICD-10-CM | POA: Diagnosis not present

## 2022-08-13 DIAGNOSIS — M8580 Other specified disorders of bone density and structure, unspecified site: Secondary | ICD-10-CM | POA: Diagnosis not present

## 2022-08-13 DIAGNOSIS — M79671 Pain in right foot: Secondary | ICD-10-CM | POA: Diagnosis not present

## 2022-08-13 DIAGNOSIS — M62838 Other muscle spasm: Secondary | ICD-10-CM | POA: Diagnosis not present

## 2022-08-13 DIAGNOSIS — M79672 Pain in left foot: Secondary | ICD-10-CM | POA: Diagnosis not present

## 2022-08-13 DIAGNOSIS — M0589 Other rheumatoid arthritis with rheumatoid factor of multiple sites: Secondary | ICD-10-CM | POA: Diagnosis not present

## 2022-08-13 DIAGNOSIS — M79642 Pain in left hand: Secondary | ICD-10-CM | POA: Diagnosis not present

## 2022-08-13 DIAGNOSIS — M79643 Pain in unspecified hand: Secondary | ICD-10-CM | POA: Diagnosis not present

## 2022-08-13 DIAGNOSIS — M25572 Pain in left ankle and joints of left foot: Secondary | ICD-10-CM | POA: Diagnosis not present

## 2022-08-13 DIAGNOSIS — Z79899 Other long term (current) drug therapy: Secondary | ICD-10-CM | POA: Diagnosis not present

## 2022-08-13 DIAGNOSIS — M150 Primary generalized (osteo)arthritis: Secondary | ICD-10-CM | POA: Diagnosis not present

## 2022-08-13 DIAGNOSIS — M25571 Pain in right ankle and joints of right foot: Secondary | ICD-10-CM | POA: Diagnosis not present

## 2022-08-13 DIAGNOSIS — E559 Vitamin D deficiency, unspecified: Secondary | ICD-10-CM | POA: Diagnosis not present

## 2022-08-17 ENCOUNTER — Ambulatory Visit: Payer: Medicare PPO | Admitting: Physical Therapy

## 2022-08-17 ENCOUNTER — Encounter: Payer: Self-pay | Admitting: Physical Therapy

## 2022-08-17 DIAGNOSIS — M25512 Pain in left shoulder: Secondary | ICD-10-CM | POA: Diagnosis not present

## 2022-08-17 DIAGNOSIS — M6281 Muscle weakness (generalized): Secondary | ICD-10-CM

## 2022-08-17 NOTE — Therapy (Signed)
OUTPATIENT PHYSICAL THERAPY SHOULDER TREATMENT   Patient Name: Nicholas Webb MRN: [id number] DOB:Sep 24, 1940, 82 y.o., male Today's Date: 08/17/2022  END OF SESSION:  PT End of Session - 08/17/22 1059     Visit Number 3    Number of Visits 8    Date for PT Re-Evaluation 09/14/22    Authorization Type humana - 8 visits approved from 5/20 to 7/15    Authorization - Visit Number 3    Authorization - Number of Visits 8    PT Start Time 1100    PT Stop Time 1140    PT Time Calculation (min) 40 min    Activity Tolerance Patient tolerated treatment well    Behavior During Therapy [facility] for tasks assessed/performed              Past Medical History:  Diagnosis Date   Anemia of chronic disease    Atherosclerosis of both carotid arteries    Atypical mole 03/28/2013   moderate atypia on mid back   Barrett's esophagus    Basal cell carcinoma 12/17/2015   superficial on right postauricular - CX3+5FU   Dizziness    GERD (gastroesophageal reflux disease)    Hx of adenomatous colonic polyps    Hyperlipidemia    Macrocytosis without anemia    Obesity    OSA (obstructive sleep apnea)    Rheumatoid arthritis(714.0)    Squamous cell carcinoma of skin 02/25/2010   in situ on left sideburn, inferior - CX3+5FU   Squamous cell carcinoma of skin 02/25/2010   well differentiated on right sideburn - CX3+5FU   Squamous cell carcinoma of skin 03/24/2011   in situ on right temple - CX3+5FU   Squamous cell carcinoma of skin 03/24/2011   in situ on right jawline - CX3+5FU   Squamous cell carcinoma of skin 03/24/2011   in situ on right angle of jaw - CX3+5FU   Squamous cell carcinoma of skin 05/05/2011   left temple - CX3+5FU   Squamous cell carcinoma of skin 11/28/2013   in situ on mid scalp - tx p bx   Squamous cell carcinoma of skin 12/17/2015   in situ on right post crown - CX3+5FU   Squamous cell carcinoma of skin 12/17/2015   verruca vulgaris with squamous atypia on right thumb -  CX3+5FU   Squamous cell carcinoma of skin 01/03/2016   in situ on right crown post inf   Squamous cell carcinoma of skin 04/22/2016   well differentiated on left outer low leg   Squamous cell carcinoma of skin 10/06/2016   in situ on right subauricular - CX3+Cautery+5FU   Squamous cell carcinoma of skin 12/22/2016   in situ on mid post crown - tx p bx   Squamous cell carcinoma of skin 01/11/2018   in situ on right crown - tx p bx   Squamous cell carcinoma of skin 01/11/2018   in situ on mid crown - tx p bx   Squamous cell carcinoma of skin 01/11/2018   in situ on left crown - tx p bx   Squamous cell carcinoma of skin 01/11/2018   well differentiated on right temple - tx p bx   Squamous cell carcinoma of skin 03/07/2019   well differentiated on right neck - tx p bx   Squamous cell carcinoma of skin 03/07/2019   in situ on right temple - tx p bx   Squamous cell carcinoma of skin 03/07/2019   in situ on mid forehead - tx  p bx   Squamous cell carcinoma of skin 01/04/2020   well diff- mid parietal scalp (CX35FU)   Squamous cell carcinoma of skin 01/04/2020   in situ- right scalp superior (CX35FU)   Squamous cell carcinoma of skin 01/04/2020   in situ-legt parietal scalp (CX35FU)    Syncope    Past Surgical History:  Procedure Laterality Date   CORONARY STENT INTERVENTION N/A 06/24/2018   Procedure: CORONARY STENT INTERVENTION;  Surgeon: Kathleene Hazel, MD;  Location: [facility];  Service: Cardiovascular;  Laterality: N/A;   IR IVC FILTER PLMT / S&I /IMG GUID/MOD SED  10/07/2018   IR IVC FILTER RETRIEVAL / S&I /IMG GUID/MOD SED  06/15/2019   IR RADIOLOGIST EVAL & MGMT  09/27/2018   IR RADIOLOGIST EVAL & MGMT  01/04/2019   JOINT REPLACEMENT  2004   left   LEFT HEART CATH AND CORONARY ANGIOGRAPHY N/A 06/24/2018   Procedure: LEFT HEART CATH AND CORONARY ANGIOGRAPHY;  Surgeon: Kathleene Hazel, MD;  Location: [facility];  Service: Cardiovascular;  Laterality:  N/A;   Patient Active Problem List   Diagnosis Date Noted   Pulmonary fibrosis (HCC) 07/17/2021   Right bundle branch block 07/17/2021   Coagulation disorder (HCC) 01/17/2019   DVT, lower extremity, distal, acute, left (HCC) 07/14/2018   Coronary artery disease involving native coronary artery of native heart without angina pectoris 07/14/2018   Wide-complex tachycardia 07/14/2018   Bilateral pulmonary embolism (HCC) 07/14/2018   Brain mass    NSTEMI (non-ST elevated myocardial infarction) (HCC) 06/23/2018   GERD (gastroesophageal reflux disease) 06/23/2018   Generalized OA 06/23/2018   HLD (hyperlipidemia) 06/23/2018   BPPV (benign paroxysmal positional vertigo), left 12/20/2015   Syncope    Dizziness     PCP: Camie Patience, FNP  REFERRING PROVIDER: Rodolph Bong, MD  REFERRING DIAG: M25.512 (ICD-10-CM) - Left shoulder pain, unspecified chronicity  THERAPY DIAG:  Acute pain of left shoulder  Muscle weakness (generalized)  Rationale for Evaluation and Treatment: Rehabilitation  ONSET DATE: 2 years ago - after reaching up to change light bulds  SUBJECTIVE:                                                                                                                                                                                      SUBJECTIVE STATEMENT: 08/17/2022 States that he is doing alright. Statrs that his shoulder is feeling alright. States he feels like he has a knot on the other side of his shoulder/neck. States that sleeping on his recliner aggravates it. He had to sleep in the recliner a few times over the weekend.   Eval: States  that about 2 years ago he was changing light bulbs overhead with a reacher and his shoulder started to bother him.  He was having difficulty lifting his arm and recently saw the doctor who gave him an injection and his pain completely went away.  He does not do any exercises for shoulder and would like to develop home exercise program  so pain does not return.   Reports he does have pain in his hands due to arthritis and gets injections for those as well, currently wearing braces on both hands.  Hand dominance: Right  PERTINENT HISTORY: GERD, CAD- mild heart attack April 2020, left TKA 2006  PAIN:  Are you having pain? no  PRECAUTIONS: None  WEIGHT BEARING RESTRICTIONS: No  FALLS:  Has patient fallen in last 6 months? No   PLOF: Independent  PATIENT GOALS: To establish home exercise program to work on  NEXT MD VISIT:   OBJECTIVE:   DIAGNOSTIC FINDINGS:  Xray left shoulder 06/15/22 FINDINGS: Minimal peripheral glenoid and acromioclavicular joint degenerative spurring. No significant joint space narrowing. No acute fracture or dislocation. The visualized portion of the left lung is unremarkable.   IMPRESSION: Minimal glenohumeral and acromioclavicular osteoarthritis.  PATIENT SURVEYS:  FOTO 50%  COGNITION: Overall cognitive status: Within functional limits for tasks assessed      POSTURE: Increased thoracic kyphosis, slumped posture, rounded shoulders, forward head, sacral sitting    UE Measurements Upper Extremity Right eval Left eval   A/PROM MMT A/PROM MMT  Shoulder Flexion 160 4+ 155 4  Shoulder Extension      Shoulder Abduction WFL 4 WFL 4-**  Shoulder Adduction      Shoulder Internal Rotation WFL 4+ WFL 4+  Shoulder External Rotation WFL 4 WFL 3+*  Elbow Flexion      Elbow Extension      Wrist Flexion      Wrist Extension      Wrist Supination      Wrist Pronation      Wrist Ulnar Deviation      Wrist Radial Deviation      Grip Strength NA  NA     (Blank rows = not tested)   * crunchy noise ** pain    JOINT MOBILITY TESTING:  Hypomobility with PA left greater than right  PALPATION:  No significant tenderness to palpation increased resting tone in left back and biceps   TODAY'S TREATMENT:                                                                                                                                          DATE:  08/17/2022  Therapeutic Exercise:  Aerobic: Supine:   Side-lying:    Seated: Self mobilization with thercane 3 minutes, self mobilization with tennis ball to UT/shoulder 5 minutes, shoulder rolls 2 minutes, scapular retraction x25 5" holds, shoulder shrugs and depression 2 minutes, cervical ROT 2  minutes, side bending cervical 2 minutes, neck rolls 2 minutes both directions  Standing: standing with back up against the wall at towel roll down back - pain in foot stopped, shoulder extension RTB 3x10 " holds with scap retraction Neuromuscular Re-education: Manual Therapy: Therapeutic Activity: Self Care: Trigger Point Dry Needling:  Modalities: thermo therapy to cervical spine/B shoulders in seated during sitting exercises.    PATIENT EDUCATION:  Education details: on HEP, on possible causes of pain behind shoulder blade, on safe use of heat to reduce risk of burns. Person educated: Patient Education method: Explanation, Demonstration, and Handouts Education comprehension: verbalized understanding   HOME EXERCISE PROGRAM: [id number]  ASSESSMENT:  CLINICAL IMPRESSION: 08/17/2022 Focused on right posterior shoulder pain today as this was limiting patient. All exercises were also beneficial for bilateral shoulders. Focused on pain management strategies as well as posture exercises. Added new exercises to HEP. Reduced pain in right posterior shoulder noted end of session. Cues throughout session to relax shoulders as he tends to keep them elevated even at rest.  Eval: Patient presents to physical therapy with complaints of recent left shoulder pain and weakness in left shoulder.  Patient demonstrates poor strength and posture that are likely contributing to current condition.  Session focused on education as well as benefits of developing strong home exercise program.  Answered all questions and patient would greatly benefit from  skilled physical therapy to reduce risk of reinjury in left shoulder.  OBJECTIVE IMPAIRMENTS: decreased ROM, decreased strength, postural dysfunction, and pain.   ACTIVITY LIMITATIONS: lifting and reach over head  PARTICIPATION LIMITATIONS: yard work  PERSONAL FACTORS: Age and Time since onset of injury/illness/exacerbation are also affecting patient's functional outcome.   REHAB POTENTIAL: Good  CLINICAL DECISION MAKING: Stable/uncomplicated  EVALUATION COMPLEXITY: Low   GOALS: Goals reviewed with patient? yes  SHORT TERM GOALS: Target date: 08/17/22  Patient will be independent in self management strategies to improve quality of life and functional outcomes. Baseline: New Program Goal status: INITIAL  2.  Patient will report at least 50% improvement in overall symptoms and/or function to demonstrate improved functional mobility Baseline: 0% better Goal status: INITIAL  3.  Patient will demonstrate pain-free MMT in left shoulder to improve tolerance to manual muscle testing. Baseline: Painful Goal status: INITIAL     LONG TERM GOALS: Target date: 09/14/22   Patient will report at least 75% improvement in overall symptoms and/or function to demonstrate improved functional mobility Baseline: 0% better Goal status: INITIAL  2.  Patient will improve score on FOTO outcomes measure to projected score to demonstrate overall improved function and QOL Baseline: see above Goal status: INITIAL  3.  Patient will demonstrate at least 4 out of 5 MMT in bilateral upper extremities to reduce risk of reinjury Baseline: See above Goal status: INITIAL   PLAN:  PT FREQUENCY: 1-2x/week for total of 8 visits over 8 week certification period  PT DURATION: 8 weeks  PLANNED INTERVENTIONS: Therapeutic exercises, Therapeutic activity, Neuromuscular re-education, Balance training, Gait training, Patient/Family education, Self Care, Joint mobilization, Joint manipulation, Stair training,  Vestibular training, Canalith repositioning, Orthotic/Fit training, DME instructions, Aquatic Therapy, Dry Needling, Electrical stimulation, Spinal manipulation, Spinal mobilization, Cryotherapy, Moist heat, Taping, Traction, Ultrasound, Fluidotherapy, Ionotophoresis 4mg /ml Dexamethasone, Manual therapy, and Re-evaluation  PLAN FOR NEXT SESSION: Shoulder strengthening, posture, anterior stretching   11:41 AM, 08/17/22 Tereasa Coop, DPT Physical Therapy with [facility] Lory

## 2022-09-01 ENCOUNTER — Ambulatory Visit: Payer: Medicare PPO | Admitting: Physical Therapy

## 2022-09-01 ENCOUNTER — Encounter: Payer: Self-pay | Admitting: Physical Therapy

## 2022-09-01 DIAGNOSIS — M25512 Pain in left shoulder: Secondary | ICD-10-CM

## 2022-09-01 DIAGNOSIS — M6281 Muscle weakness (generalized): Secondary | ICD-10-CM | POA: Diagnosis not present

## 2022-09-01 NOTE — Therapy (Signed)
OUTPATIENT PHYSICAL THERAPY SHOULDER TREATMENT PHYSICAL THERAPY DISCHARGE SUMMARY  Visits from Start of Care: 4  Current functional level related to goals / functional outcomes: See below   Remaining deficits: strength   Education / Equipment: See below   Patient agrees to discharge. Patient goals were met. Patient is being discharged due to being pleased with the current functional level.   Patient Name: Nicholas Webb MRN: [id number] DOB:[birth year], 82 y.o., male Today's Date: 09/01/2022  END OF SESSION:  PT End of Session - 09/01/22 0952     Visit Number 4    Number of Visits 8    Date for PT Re-Evaluation 09/14/22    Authorization Type humana - 8 visits approved from 5/20 to 7/15    Authorization - Visit Number 4    Authorization - Number of Visits 8    PT Start Time 1015    PT Stop Time 1048   pt requested to be done early   PT Time Calculation (min) 33 min    Activity Tolerance Patient tolerated treatment well    Behavior During Therapy [facility] for tasks assessed/performed              Past Medical History:  Diagnosis Date   Anemia of chronic disease    Atherosclerosis of both carotid arteries    Atypical mole 03/28/2013   moderate atypia on mid back   Barrett's esophagus    Basal cell carcinoma 12/17/2015   superficial on right postauricular - CX3+5FU   Dizziness    GERD (gastroesophageal reflux disease)    Hx of adenomatous colonic polyps    Hyperlipidemia    Macrocytosis without anemia    Obesity    OSA (obstructive sleep apnea)    Rheumatoid arthritis(714.0)    Squamous cell carcinoma of skin 02/25/2010   in situ on left sideburn, inferior - CX3+5FU   Squamous cell carcinoma of skin 02/25/2010   well differentiated on right sideburn - CX3+5FU   Squamous cell carcinoma of skin 03/24/2011   in situ on right temple - CX3+5FU   Squamous cell carcinoma of skin 03/24/2011   in situ on right jawline - CX3+5FU   Squamous cell carcinoma of skin  03/24/2011   in situ on right angle of jaw - CX3+5FU   Squamous cell carcinoma of skin 05/05/2011   left temple - CX3+5FU   Squamous cell carcinoma of skin 11/28/2013   in situ on mid scalp - tx p bx   Squamous cell carcinoma of skin 12/17/2015   in situ on right post crown - CX3+5FU   Squamous cell carcinoma of skin 12/17/2015   verruca vulgaris with squamous atypia on right thumb - CX3+5FU   Squamous cell carcinoma of skin 01/03/2016   in situ on right crown post inf   Squamous cell carcinoma of skin 04/22/2016   well differentiated on left outer low leg   Squamous cell carcinoma of skin 10/06/2016   in situ on right subauricular - CX3+Cautery+5FU   Squamous cell carcinoma of skin 12/22/2016   in situ on mid post crown - tx p bx   Squamous cell carcinoma of skin 01/11/2018   in situ on right crown - tx p bx   Squamous cell carcinoma of skin 01/11/2018   in situ on mid crown - tx p bx   Squamous cell carcinoma of skin 01/11/2018   in situ on left crown - tx p bx   Squamous cell carcinoma of skin 01/11/2018  well differentiated on right temple - tx p bx   Squamous cell carcinoma of skin 03/07/2019   well differentiated on right neck - tx p bx   Squamous cell carcinoma of skin 03/07/2019   in situ on right temple - tx p bx   Squamous cell carcinoma of skin 03/07/2019   in situ on mid forehead - tx p bx   Squamous cell carcinoma of skin 01/04/2020   well diff- mid parietal scalp (CX35FU)   Squamous cell carcinoma of skin 01/04/2020   in situ- right scalp superior (CX35FU)   Squamous cell carcinoma of skin 01/04/2020   in situ-legt parietal scalp (CX35FU)    Syncope    Past Surgical History:  Procedure Laterality Date   CORONARY STENT INTERVENTION N/A 06/24/2018   Procedure: CORONARY STENT INTERVENTION;  Surgeon: Kathleene Hazel, MD;  Location: [facility];  Service: Cardiovascular;  Laterality: N/A;   IR IVC FILTER PLMT / S&I /IMG GUID/MOD SED  10/07/2018   IR  IVC FILTER RETRIEVAL / S&I /IMG GUID/MOD SED  06/15/2019   IR RADIOLOGIST EVAL & MGMT  09/27/2018   IR RADIOLOGIST EVAL & MGMT  01/04/2019   JOINT REPLACEMENT  2004   left   LEFT HEART CATH AND CORONARY ANGIOGRAPHY N/A 06/24/2018   Procedure: LEFT HEART CATH AND CORONARY ANGIOGRAPHY;  Surgeon: Kathleene Hazel, MD;  Location: [facility];  Service: Cardiovascular;  Laterality: N/A;   Patient Active Problem List   Diagnosis Date Noted   Pulmonary fibrosis (HCC) 07/17/2021   Right bundle branch block 07/17/2021   Coagulation disorder (HCC) 01/17/2019   DVT, lower extremity, distal, acute, left (HCC) 07/14/2018   Coronary artery disease involving native coronary artery of native heart without angina pectoris 07/14/2018   Wide-complex tachycardia 07/14/2018   Bilateral pulmonary embolism (HCC) 07/14/2018   Brain mass    NSTEMI (non-ST elevated myocardial infarction) (HCC) 06/23/2018   GERD (gastroesophageal reflux disease) 06/23/2018   Generalized OA 06/23/2018   HLD (hyperlipidemia) 06/23/2018   BPPV (benign paroxysmal positional vertigo), left 12/20/2015   Syncope    Dizziness     PCP: Camie Patience, FNP  REFERRING PROVIDER: Rodolph Bong, MD  REFERRING DIAG: M25.512 (ICD-10-CM) - Left shoulder pain, unspecified chronicity  THERAPY DIAG:  Acute pain of left shoulder  Muscle weakness (generalized)  Rationale for Evaluation and Treatment: Rehabilitation  ONSET DATE: 2 years ago - after reaching up to change light bulds  SUBJECTIVE:  SUBJECTIVE STATEMENT: 09/01/2022 States that his shoulder is feeling better and he only has occasional pain in his left shoulder. States he just has his some aches in his hands. States he uses braces and that helps. States overall he feels about 85% better  since the start of PT.   Eval: States that about 2 years ago he was changing light bulbs overhead with a reacher and his shoulder started to bother him.  He was having difficulty lifting his arm and recently saw the doctor who gave him an injection and his pain completely went away.  He does not do any exercises for shoulder and would like to develop home exercise program so pain does not return.   Reports he does have pain in his hands due to arthritis and gets injections for those as well, currently wearing braces on both hands.  Hand dominance: Right  PERTINENT HISTORY: GERD, CAD- mild heart attack April 2020, left TKA 2006  PAIN:  Are you having pain? 1/10 left shoulder  PRECAUTIONS: None  WEIGHT BEARING RESTRICTIONS: No  FALLS:  Has patient fallen in last 6 months? No   PLOF: Independent  PATIENT GOALS: To establish home exercise program to work on    OBJECTIVE:   DIAGNOSTIC FINDINGS:  Xray left shoulder 06/15/22 FINDINGS: Minimal peripheral glenoid and acromioclavicular joint degenerative spurring. No significant joint space narrowing. No acute fracture or dislocation. The visualized portion of the left lung is unremarkable.   IMPRESSION: Minimal glenohumeral and acromioclavicular osteoarthritis.  PATIENT SURVEYS:  FOTO 50%  Eval Foto 78% 09/01/22  COGNITION: Overall cognitive status: Within functional limits for tasks assessed      POSTURE: Increased thoracic kyphosis, slumped posture, rounded shoulders, forward head, sacral sitting    UE Measurements Upper Extremity Right 09/01/22 Left 09/01/22   A/PROM MMT A/PROM MMT  Shoulder Flexion 170 4+ 165 4+  Shoulder Extension      Shoulder Abduction WFL 4 WFL 4-  Shoulder Adduction      Shoulder Internal Rotation WFL 4+ WFL 4+  Shoulder External Rotation WFL 4 WFL 4-  Elbow Flexion      Elbow Extension      Wrist Flexion      Wrist Extension      Wrist Supination      Wrist Pronation      Wrist Ulnar  Deviation      Wrist Radial Deviation      Grip Strength NA  NA     (Blank rows = not tested)    TODAY'S TREATMENT:                                                                                                                                         DATE:  09/01/2022  Therapeutic Exercise:  Obj measures updated - see above Reviewed HEP Supine:   Side-lying:    Seated:  Neuromuscular Re-education: Manual Therapy:IASTM  to B hands with padding with percussion gun - tolerated well 10 minutes Therapeutic Activity: Self Care: Trigger Point Dry Needling:    PATIENT EDUCATION:  Education details: reviewed HEP and safe use of percussion gun Person educated: Patient Education method: Explanation, Demonstration, and Handouts Education comprehension: verbalized understanding   HOME EXERCISE PROGRAM: [id number]  ASSESSMENT:  CLINICAL IMPRESSION: 09/01/2022 Patient doing well and has met 2/3 short term goals and 2/3 long term goals at this time. Educated patient in progress and importance of continued adherence to HEP. Discussed and educated patient in safe use of percussion gun on hands to help with arthritis pain. Tolerated well reporting reduced tension in hands after soft tissue mobilization  to hands. Reviewed entire HEP and answered all questions. Patient to discharge from PT to HEP secondary to progress made.  Eval: Patient presents to physical therapy with complaints of recent left shoulder pain and weakness in left shoulder.  Patient demonstrates poor strength and posture that are likely contributing to current condition.  Session focused on education as well as benefits of developing strong home exercise program.  Answered all questions and patient would greatly benefit from skilled physical therapy to reduce risk of reinjury in left shoulder.  OBJECTIVE IMPAIRMENTS: decreased ROM, decreased strength, postural dysfunction, and pain.   ACTIVITY LIMITATIONS: lifting and reach over  head  PARTICIPATION LIMITATIONS: yard work  PERSONAL FACTORS: Age and Time since onset of injury/illness/exacerbation are also affecting patient's functional outcome.   REHAB POTENTIAL: Good  CLINICAL DECISION MAKING: Stable/uncomplicated  EVALUATION COMPLEXITY: Low   GOALS: Goals reviewed with patient? yes  SHORT TERM GOALS: Target date: 08/17/22  Patient will be independent in self management strategies to improve quality of life and functional outcomes. Baseline: New Program Goal status: PROGRESSING   2.  Patient will report at least 50% improvement in overall symptoms and/or function to demonstrate improved functional mobility Baseline: 0% better Goal status: MET  3.  Patient will demonstrate pain-free MMT in left shoulder to improve tolerance to manual muscle testing. Baseline: Painful Goal status: MET     LONG TERM GOALS: Target date: 09/14/22   Patient will report at least 75% improvement in overall symptoms and/or function to demonstrate improved functional mobility Baseline: 0% better Goal status: MET  2.  Patient will improve score on FOTO outcomes measure to projected score to demonstrate overall improved function and QOL Baseline: see above Goal status: MET  3.  Patient will demonstrate at least 4 out of 5 MMT in bilateral upper extremities to reduce risk of reinjury Baseline: See above Goal status: PROGRESSING   PLAN:  PT FREQUENCY: 1-2x/week for total of 8 visits over 8 week certification period  PT DURATION: 8 weeks  PLANNED INTERVENTIONS: Therapeutic exercises, Therapeutic activity, Neuromuscular re-education, Balance training, Gait training, Patient/Family education, Self Care, Joint mobilization, Joint manipulation, Stair training, Vestibular training, Canalith repositioning, Orthotic/Fit training, DME instructions, Aquatic Therapy, Dry Needling, Electrical stimulation, Spinal manipulation, Spinal mobilization, Cryotherapy, Moist heat, Taping,  Traction, Ultrasound, Fluidotherapy, Ionotophoresis 4mg /ml Dexamethasone, Manual therapy, and Re-evaluation  PLAN FOR NEXT SESSION: DC to HEP    10:49 AM, 09/01/22 Tereasa Coop, DPT Physical Therapy with Dol[facility] Lory

## 2022-09-09 DIAGNOSIS — G4733 Obstructive sleep apnea (adult) (pediatric): Secondary | ICD-10-CM | POA: Diagnosis not present

## 2022-09-10 DIAGNOSIS — R208 Other disturbances of skin sensation: Secondary | ICD-10-CM | POA: Diagnosis not present

## 2022-09-10 DIAGNOSIS — L538 Other specified erythematous conditions: Secondary | ICD-10-CM | POA: Diagnosis not present

## 2022-09-10 DIAGNOSIS — Z789 Other specified health status: Secondary | ICD-10-CM | POA: Diagnosis not present

## 2022-09-10 DIAGNOSIS — D485 Neoplasm of uncertain behavior of skin: Secondary | ICD-10-CM | POA: Diagnosis not present

## 2022-09-10 DIAGNOSIS — L570 Actinic keratosis: Secondary | ICD-10-CM | POA: Diagnosis not present

## 2022-09-10 DIAGNOSIS — C44329 Squamous cell carcinoma of skin of other parts of face: Secondary | ICD-10-CM | POA: Diagnosis not present

## 2022-09-10 DIAGNOSIS — L820 Inflamed seborrheic keratosis: Secondary | ICD-10-CM | POA: Diagnosis not present

## 2022-09-10 DIAGNOSIS — L298 Other pruritus: Secondary | ICD-10-CM | POA: Diagnosis not present

## 2022-09-13 ENCOUNTER — Other Ambulatory Visit: Payer: Self-pay | Admitting: Cardiology

## 2022-09-13 DIAGNOSIS — I2510 Atherosclerotic heart disease of native coronary artery without angina pectoris: Secondary | ICD-10-CM

## 2022-09-24 ENCOUNTER — Ambulatory Visit: Payer: Medicare PPO | Admitting: Internal Medicine

## 2022-09-24 ENCOUNTER — Encounter: Payer: Medicare PPO | Admitting: *Deleted

## 2022-09-24 ENCOUNTER — Encounter: Payer: Self-pay | Admitting: Internal Medicine

## 2022-09-24 VITALS — BP 104/60 | HR 68 | Ht 68.0 in | Wt 186.4 lb

## 2022-09-24 DIAGNOSIS — Z7185 Encounter for immunization safety counseling: Secondary | ICD-10-CM | POA: Diagnosis not present

## 2022-09-24 DIAGNOSIS — J849 Interstitial pulmonary disease, unspecified: Secondary | ICD-10-CM | POA: Diagnosis not present

## 2022-09-24 DIAGNOSIS — Z006 Encounter for examination for normal comparison and control in clinical research program: Secondary | ICD-10-CM

## 2022-09-24 DIAGNOSIS — Z8739 Personal history of other diseases of the musculoskeletal system and connective tissue: Secondary | ICD-10-CM

## 2022-09-24 DIAGNOSIS — J84112 Idiopathic pulmonary fibrosis: Secondary | ICD-10-CM

## 2022-09-24 NOTE — Patient Instructions (Addendum)
ICD-10-CM   1. ILD (interstitial lung disease) (HCC)  J84.9     2. History of rheumatoid arthritis  Z87.39     3. 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 However, stable on PFT betwee Nov 2022 - > Jan 2024 -> symptoms stable 09/24/2022    - I think immuran is helping control the ILD  - Now on prednsione since May 2024 via rheumatology  Multiple small lung nodules probably due to rheumatoid arthritis (March 2022 last CT)]  - no change Aug 2023 and considered benign  Plan -continue immuran and prednisone via Dr Kathi Ludwig - Hold off on starting anti-fibrotic ofev/esbriet  - based on shared decision making -  ILD PRO registry visit followup visit 09/24/2022 (already done) -Do spirometry and DLCO in 6 months- - get RSV vaccine and flu shot in fall  - get pneumonia vaccine - PREVNAR-20 09/24/2022    Follow-up - Return in 6 months -but after breathing test   -Symptom score and walking desaturation test at follow-up

## 2022-09-24 NOTE — Addendum Note (Signed)
Addended by: Hedda Slade on: 09/24/2022 09:46 AM   Modules accepted: Orders

## 2022-09-24 NOTE — Progress Notes (Signed)
OV 07/17/2020  Subjective:  Patient ID: Nicholas Webb, male , DOB: 30-Jan-1941 , age 82 y.o. , MRN: [id number] , ADDRESS: [address] [place] [address] PCP Shirlean Mylar, MD Patient Care Team: Shirlean Mylar, MD as PCP - General (Family Medicine) Jake Bathe, MD as PCP - Cardiology (Cardiology) Glyn Ade, PA-C as Physician Assistant (Dermatology) Janalyn Harder, MD as Consulting Physician (Dermatology)  This Provider for this visit: Treatment Team:  Attending Provider: Kalman Shan, MD    07/17/2020 -   Chief Complaint  Patient presents with   Follow-up    PFT performed 06/17/20. Pt states he is about the same since last visit.   Rheumatoid arthritis with interstitial lung disease History of pulmonary embolism on Eliquis - since April 2020 History of coronary artery disease follows Dr. Anne Fu  HPI Nicholas Webb 82 y.o. -presents for follow-up.  He is accompanied by his daughter.  This visit is to focus on discussing the results.  His symptoms are extremely mild from a respiratory standpoint documented below.  His high-resolution CT chest shows interstitial lung disease probable UIP pattern.  He also has multiple small pulmonary nodules.  There is coronary artery calcification [he is known to have coronary artery disease and follows Dr. Anne Fu.  His pulmonary function test shows very mild restriction from his ILD.  His walking desaturation test is essentially close to normal except for tachycardia.  Taken together the burden of ILD is mild.  His daughter wants to know how to approach his ILD.  They report that he was on methotrexate for many years [greater than 10].  There was wheezing and that is why he was referred to [place].  He is currently off methotrexate.  He started working out at [employer] well     HRCT Chest data march 2022 CLINICAL DATA:  Interstitial lung disease, history of rheumatoid arthritis   EXAM: CT CHEST WITHOUT CONTRAST    TECHNIQUE: Multidetector CT imaging of the chest was performed following the standard protocol without intravenous contrast. High resolution imaging of the lungs, as well as inspiratory and expiratory imaging, was performed.   COMPARISON:  None.   FINDINGS: Cardiovascular: Aortic atherosclerosis. Normal heart size. Left coronary artery calcifications and stents. No pericardial effusion.   Mediastinum/Nodes: No enlarged mediastinal, hilar, or axillary lymph nodes. Thyroid gland, trachea, and esophagus demonstrate no significant findings.   Lungs/Pleura: There is moderate pulmonary fibrosis in a pattern with apical to basal gradient featuring irregular peripheral interstitial opacity, septal thickening, mild traction bronchiectasis, areas of subpleural bronchiolectasis without clear evidence of honeycombing. No significant air trapping on expiratory phase imaging. There multiple small pulmonary nodules in the bilateral lung apices, generally irregular in morphology and measuring up to 5 mm, for example in the left apex (series 10, image 58). No pleural effusion or pneumothorax.   Upper Abdomen: No acute abnormality.   Musculoskeletal: No chest wall mass or suspicious bone lesions identified.  IMPRESSION: 1. There is moderate pulmonary fibrosis in a pattern with apical to basal gradient featuring irregular peripheral interstitial opacity, septal thickening, mild traction bronchiectasis, and areas of subpleural bronchiolectasis without clear evidence of honeycombing. Findings are consistent with a "probable UIP" pattern and generally in keeping with rheumatoid associated interstitial lung disease. Findings are categorized as probable UIP per consensus guidelines: Diagnosis of Idiopathic Pulmonary Fibrosis: An Official ATS/ERS/JRS/ALAT Clinical Practice Guideline. Am Rosezetta Schlatter Crit Care Med Vol 198, Iss 5, [phone], Oct 31 2016. 2. There multiple small nonspecific  pulmonary  nodules in the bilateral lung apices, generally irregular in morphology and measuring 5 mm and smaller. No follow-up needed if patient is low-risk (and has no known or suspected primary neoplasm). Non-contrast chest CT can be considered in 12 months if patient is high-risk. This recommendation follows the consensus statement: Guidelines for Management of Incidental Pulmonary Nodules Detected on CT Images: From the Fleischner Society 2017; Radiology 2017; 284:228-243. 3. Coronary artery disease.   Aortic Atherosclerosis (ICD10-I70.0).     Electronically Signed   By: Lauralyn Primes M.D.   On: 05/22/2020 11:58      OV 01/16/2021  Subjective:  Patient ID: Nicholas Webb, male , DOB: 1940-05-15 , age 82 y.o. , MRN: [id number] , ADDRESS: [address] [place] [address] PCP Shirlean Mylar, MD (Inactive) Patient Care Team: Shirlean Mylar, MD (Inactive) as PCP - General (Family Medicine) Jake Bathe, MD as PCP - Cardiology (Cardiology) Suzi Roots as Physician Assistant (Dermatology) Janalyn Harder, MD as Consulting Physician (Dermatology)  This Provider for this visit: Treatment Team:  Attending Provider: Kalman Shan, MD   Rheumatoid arthritis with interstitial lung disease  -Started on Imuran by rheumatology between May 2022 and November 2022  -Last HRCT March 2022 History of pulmonary embolism on Eliquis - since April 2020 History of coronary artery disease follows Dr. Anne Fu Pulmonary nodules associated with RA ILD  01/16/2021 -   Chief Complaint  Patient presents with   Follow-up    No new concerns, pft done today     HPI Nicholas Webb 82 y.o. -presents for routine follow-up.  He feels stable.  His dyspnea score is all altered.  It is documented below.  His walking desaturation test simple is also unchanged.  He feels great.  In the interim his rheumatologist started him on Imuran for his rheumatoid arthritis pain.  He feels good overall.   However his pulmonary function test shows 3-4% decline in FVC and a 16% decline in DLCO.  He is not feeling this.  We discussed about adding antifibrotic to his Imuran with the understanding that the Imuran itself might be having a antifibrotic effect.  He does not want to start this right now.     CT Chest data  No results found.  OV 03/27/2021  Subjective:  Patient ID: Nicholas Webb, male , DOB: [birth year] , age 30 y.o. , MRN: [id number] , ADDRESS: [address] [place] [address] PCP Shirlean Mylar, MD Patient Care Team: Shirlean Mylar, MD as PCP - General (Family Medicine) Jake Bathe, MD as PCP - Cardiology (Cardiology) Glyn Ade, PA-C as Physician Assistant (Dermatology) Janalyn Harder, MD as Consulting Physician (Dermatology)  This Provider for this visit: Treatment Team:  Attending Provider: Kalman Shan, MD    03/27/2021 -   Chief Complaint  Patient presents with   Follow-up    10 week follow up with pft. Pt states that breathing is the same     HPI Nicholas Webb 82 y.o. -returns for follow-up.  He presents with his daughter Nicholas Webb who is nurse practitioner within the [facility] health system.  Since his last visit he stoppe exercises at [facility].  His daughter tried to encourage him in my presence.  I also encouraged him.  However he is not that motivated.  He continues on Imuran.  He feels his breathing is the same.  His symptom score shows possibly symptoms are slightly worse but he feels the same.  We did pulmonary  function test today and compared to November 2022 it is stable but compared to spring of last year the DLCO has declined.  This decline was apparent between November 2022 in April 2022.  Since starting Imuran lung function is stable.  Overall will deem this is stability.  He is interested in participating in research protocol.  In terms of lung nodules are very small.  Very likely due to rheumatoid.  Last CT scan was in March 2022.  1 year  CT scan will be the spring 2023 or summer 2023.       OV 10/13/2021  Subjective:  Patient ID: Nicholas Webb, male , DOB: 1940/10/06 , age 62 y.o. , MRN: [id number] , ADDRESS: [address] [place] [facility] [address] PCP Camie Patience, FNP Patient Care Team: Camie Patience, FNP as PCP - General (Family Medicine) Jake Bathe, MD as PCP - Cardiology (Cardiology) Glyn Ade, PA-C as Physician Assistant (Dermatology) Janalyn Harder, MD as Consulting Physician (Dermatology)  This Provider for this visit: Treatment Team:  Attending Provider: Kalman Shan, MD    10/13/2021 -   Chief Complaint  Patient presents with   Follow-up    PFT performed today.  Pt states he has been doing okay since last visit and denies any complaints.       HPI Nicholas Webb 82 y.o. -returns for follow-up.  He is done his ILD-Pro registry visit today.  He feels stable.  Symptom scores are mild.  Walking desaturation test is stable.  He had pulmonary function test and it is stable compared to November 2022.  November 2022 is when he started his Imuran.  Since then his pulmonary function test is stable although prior to that his pulmonary function test and his CT scan done this summer compared to pre-Imuran show progression.  Overall he is stable.  Currently is not interested in antifibrotic's.      OV 03/31/2022  Subjective:  Patient ID: Nicholas Webb, male , DOB: [birth year] , age 27 y.o. , MRN: [id number] , ADDRESS: [address] [place] [facility] [address] PCP Camie Patience, FNP Patient Care Team: Camie Patience, FNP as PCP - General (Family Medicine) Jake Bathe, MD as PCP - Cardiology (Cardiology) Glyn Ade, PA-C as Physician Assistant (Dermatology) Janalyn Harder, MD (Inactive) as Consulting Physician (Dermatology)  This Provider for this visit: Treatment Team:  Attending Provider: Kalman Shan, MD    03/31/2022 -   Chief Complaint  Patient  presents with   Follow-up    PFT done today. Had resp infection in early dec 2023- took augmentin, now only occ cough with white sputum. His breathing is unchanged "maybe a little better".      HPI Nicholas Webb 82 y.o. -returns for routine follow-up.  Last visit August 2023.  He is doing well.  He feels he is stable.  He continues on Imuran and prednisone through rheumatology.  His symptom score is stable pulmonary function test is stable.  He is up-to-date with the rest respiratory vaccines.  I personally reviewed the pulmonary function test from today.  He also participated in ILD-Pro registry visit today as a follow-up.  His walking desaturation test is stable.      OV 09/24/2022  Subjective:  Patient ID: Nicholas Webb, male , DOB: September 04, 1940 , age 19 y.o. , MRN: [id number] , ADDRESS: [address] [place] [facility] [address] PCP Camie Patience, FNP Patient Care Team: Camie Patience, FNP as PCP -  General (Family Medicine) Jake Bathe, MD as PCP - Cardiology (Cardiology) Suzi Roots as Physician Assistant (Dermatology) Janalyn Harder, MD (Inactive) as Consulting Physician (Dermatology)  This Provider for this visit: Treatment Team:  Attending Provider: Kalman Shan, MD  Rheumatoid arthritis with interstitial lung disease  -Started on Imuran by rheumatology between May 2022 and November 2022  -Last HRCT March 2022 -> June 2023 with progression 0  - PFT progressed April 2022 - Nov 2022 -> immuran Rx -> stable Aug 2023  - ILD PRO participant  History of pulmonary embolism on Eliquis - since April 2020 History of coronary artery disease follows Dr. Anne Fu  -s/p stent April 2020 Pulmonary nodules associated with RA ILD - 5mm UL in march 2022 and no change Jun 2023  09/24/2022 -   Chief Complaint  Patient presents with   Follow-up    Annual f/up, no complaints     HPI Nicholas Webb 82 y.o. -returns for follow-up.  Since his last visit he  tells me that his arthritis is worse than 3 months ago Dr. Kathi Ludwig his rheumatologist put him on prednisone 5 mg/day and it is helping.  He now has wrist splints.  Also a skin cancer not otherwise specified in his left face.  Therefore he has not shaved and is supporting a beard.  He states he will soon shave himself.  Otherwise from a respiratory standpoint he is feeling stable.  There are no new complaints.  Pulmonary function test not done today because schedule issues.  But he feels same symptoms were the same.  We did a sit/stand exercise hypoxemia test and it shows stability.  There are no other new issues.  We did vaccine counseling.    SYMPTOM SCALE - ILD 07/17/2020  03/27/2021   10/13/2021  03/31/2022 immuran 09/24/2022 immuran  O2 use ra ra ra ra ra  Shortness of Breath 0 -> 5 scale with 5 being worst (score 6 If unable to do)      At rest 0 0 1 0 1  Simple tasks - showers, clothes change, eating, shaving 0 0 1 2 1   Household (dishes, doing bed, laundry) 1 2 0 1 1  Shopping 0 0 0 1 1  Walking level at own pace 0 2 0 1 1  Walking up Stairs 1 2 1 1 2   Total (30-36) Dyspnea Score 2 6 3 6 7   How bad is your cough? minimal 1 0 3 1  How bad is your fatigue 0 0 1 2 1   How bad is nausea 0 0 0 0 1  How bad is vomiting?  0 0 0 0 1  How bad is diarrhea? 0 0 00 1 1  How bad is anxiety? 00 2 0 1 1  How bad is depression 0 0 0 1 1       Simple office walk x  3 laps goal with forehead probe 07/17/2020  01/16/2021  03/27/2021  10/13/2021  03/31/2022  09/24/2022   O2 used ra ra Ra ra3 ra ra  Number laps completed 3 3 3 3 3  Sit stand x 15  Comments about pace avg avg      Resting Pulse Ox/HR 98% and 74/min 97% and 71/min 99% and 67 98% and HR 64 99% and HR 70 98% an dHR 73  Final Pulse Ox/HR 96% and 108/min 96% and HR 106 95% and 106 96% and HR 99 95% and HR 116 96% and HR 92  Desaturated </= 88% no no no no  no  Desaturated <= 3% points no no yes no Yes 4 pints no  Got Tachycardic >/=  90/min yes yes yes yes Yes yes  Symptoms at end of test none none none No complaints  4 of 10 dyapnea  Miscellaneous comments x No change x x  stable     PFT     Latest Ref Rng & Units 03/31/2022    9:22 AM 10/13/2021    2:50 PM 03/27/2021    9:38 AM 01/16/2021    9:36 AM 06/17/2020    9:31 AM  ILD indicators  FVC-Pre L 3.56  3.64  3.61  3.56  3.71   FVC-Predicted Pre % 96  98  96  95  98   FVC-Post L     3.75   FVC-Predicted Post %     99   TLC L     5.30   TLC Predicted %     79   DLCO uncorrected ml/min/mmHg 15.73  15.96  15.93  15.03  18.32   DLCO UNC %Pred % 68  69  69  65  78   DLCO Corrected ml/min/mmHg 15.73  15.96  15.93  15.03  18.32   DLCO COR %Pred % 68  69  69  65  78       LAB RESULTS last 96 hours No results found.  LAB RESULTS last 90 days No results found for this or any previous visit (from the past 2160 hour(s)).       has a past medical history of Anemia of chronic disease, Atherosclerosis of both carotid arteries, Atypical mole (03/28/2013), Barrett's esophagus, Basal cell carcinoma (12/17/2015), Dizziness, GERD (gastroesophageal reflux disease), adenomatous colonic polyps, Hyperlipidemia, Macrocytosis without anemia, Obesity, OSA (obstructive sleep apnea), Rheumatoid arthritis(714.0), Squamous cell carcinoma of skin (02/25/2010), Squamous cell carcinoma of skin (02/25/2010), Squamous cell carcinoma of skin (03/24/2011), Squamous cell carcinoma of skin (03/24/2011), Squamous cell carcinoma of skin (03/24/2011), Squamous cell carcinoma of skin (05/05/2011), Squamous cell carcinoma of skin (11/28/2013), Squamous cell carcinoma of skin (12/17/2015), Squamous cell carcinoma of skin (12/17/2015), Squamous cell carcinoma of skin (01/03/2016), Squamous cell carcinoma of skin (04/22/2016), Squamous cell carcinoma of skin (10/06/2016), Squamous cell carcinoma of skin (12/22/2016), Squamous cell carcinoma of skin (01/11/2018), Squamous cell carcinoma of skin  (01/11/2018), Squamous cell carcinoma of skin (01/11/2018), Squamous cell carcinoma of skin (01/11/2018), Squamous cell carcinoma of skin (03/07/2019), Squamous cell carcinoma of skin (03/07/2019), Squamous cell carcinoma of skin (03/07/2019), Squamous cell carcinoma of skin (01/04/2020), Squamous cell carcinoma of skin (01/04/2020), Squamous cell carcinoma of skin (01/04/2020), and Syncope.   reports that he has never smoked. He has never used smokeless tobacco.  Past Surgical History:  Procedure Laterality Date   CORONARY STENT INTERVENTION N/A 06/24/2018   Procedure: CORONARY STENT INTERVENTION;  Surgeon: Kathleene Hazel, MD;  Location: [facility];  Service: Cardiovascular;  Laterality: N/A;   IR IVC FILTER PLMT / S&I /IMG GUID/MOD SED  10/07/2018   IR IVC FILTER RETRIEVAL / S&I /IMG GUID/MOD SED  06/15/2019   IR RADIOLOGIST EVAL & MGMT  09/27/2018   IR RADIOLOGIST EVAL & MGMT  01/04/2019   JOINT REPLACEMENT  2004   left   LEFT HEART CATH AND CORONARY ANGIOGRAPHY N/A 06/24/2018   Procedure: LEFT HEART CATH AND CORONARY ANGIOGRAPHY;  Surgeon: Kathleene Hazel, MD;  Location: [facility]VAS[facility] CV [facility];  Service: Cardiovascular;  Laterality: N/A;    No  Known Allergies  Immunization History  Administered Date(s) Administered   Influenza, High Dose Seasonal PF 12/18/2013, 12/18/2014, 12/13/2017   Influenza, Quadrivalent, Recombinant, Inj, Pf 12/14/2016, 12/07/2018   Influenza-Unspecified 12/01/2012, 11/30/2020, 11/26/2021   PFIZER(Purple Top)SARS-COV-2 Vaccination 03/23/2019, 04/13/2019, 11/30/2020, 11/26/2021   Tdap 06/23/2018   Zoster Recombinant(Shingrix) 05/08/2017, 07/09/2017    Family History  Problem Relation Age of Onset   Stroke Mother    Glaucoma Mother    Cancer Father    Prostate cancer Father    Colon cancer Father      Current Outpatient Medications:    atorvastatin (LIPITOR) 40 MG tablet, Take 1 tablet (40 mg total) by mouth daily. Pt needs to keep  upcoming appt in Nov for further refills, Disp: 90 tablet, Rfl: 1   azaTHIOprine (IMURAN) 50 MG tablet, Take 50 mg by mouth 2 (two) times daily., Disp: , Rfl:    denosumab (PROLIA) 60 MG/ML SOSY injection, Inject 60 mg into the skin every 6 (six) months., Disp: , Rfl:    ELIQUIS 5 MG TABS tablet, TAKE 1 TABLET(5 MG) BY MOUTH TWICE DAILY, Disp: 60 tablet, Rfl: 5   esomeprazole (NEXIUM) 20 MG capsule, Take 20 mg by mouth daily at 12 noon. , Disp: , Rfl:    folic acid (FOLVITE) 1 MG tablet, Take by mouth. 1-2 daily, Disp: , Rfl:    Multiple Vitamins-Minerals (MULTIVITAMIN WITH MINERALS) tablet, Take 1 tablet by mouth daily., Disp: , Rfl:    Niacinamide-Zn-Cu-Methfo-Se-Cr (NICOTINAMIDE PO), Take 500 mg by mouth., Disp: , Rfl:    predniSONE (DELTASONE) 5 MG tablet, Take 5 mg by mouth daily with breakfast. Dr Kathi Ludwig, Disp: , Rfl:    PREVIDENT 5000 PLUS 1.1 % CREA dental cream, Place 1 Application onto teeth as directed., Disp: , Rfl:       Objective:   Vitals:   09/24/22 0900  BP: 104/60  Pulse: 68  SpO2: 92%  Weight: 186 lb 6.4 oz (84.6 kg)  Height: 5\' 8"  (1.727 m)    Estimated body mass index is 28.34 kg/m as calculated from the following:   Height as of this encounter: 5\' 8"  (1.727 m).   Weight as of this encounter: 186 lb 6.4 oz (84.6 kg).  @WEIGHTCHANGE @  [employer]   09/24/22 0900  Weight: 186 lb 6.4 oz (84.6 kg)     Physical Exam   General: No distress. Looks well O2 at rest: no Cane present: no Sitting in wheel chair: no Frail: no Obese: no Neuro: Alert and Oriented x 3. GCS 15. Speech normal Psych: Pleasant Resp:  Barrel Chest - no.  Wheeze - no, Crackles - yes base, No overt respiratory distress CVS: Normal heart sounds. Murmurs - no Ext: Stigmata of Connective Tissue Disease - no HEENT: Normal upper airway. PEERL +. No post nasal drip        Assessment:       ICD-10-CM   1. ILD (interstitial lung disease) (HCC)  J84.9     2. History of rheumatoid  arthritis  Z87.39     3. Vaccine counseling  Z71.85          Plan:     Patient Instructions     ICD-10-CM   1. ILD (interstitial lung disease) (HCC)  J84.9     2. History of rheumatoid arthritis  Z87.39     3. 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 However, stable on PFT betwee Nov 2022 - > Jan 2024 -> symptoms stable 09/24/2022    - I think immuran is helping control the ILD  - Now on prednsione since May 2024 via rheumatology  Multiple small lung nodules probably due to rheumatoid arthritis (March 2022 last CT)]  - no change Aug 2023 and considered benign  Plan -continue immuran and prednisone via Dr Kathi Ludwig - Hold off on starting anti-fibrotic ofev/esbriet  - based on shared decision making -  ILD PRO registry visit followup visit 09/24/2022 (already done) -Do spirometry and DLCO in 6 months- - get RSV vaccine and flu shot in fall  - get pneumonia vaccine - PREVNAR-20 09/24/2022    Follow-up - Return in 6 months -but after breathing test   -Symptom score and walking desaturation test at follow-up   FOLLOWUP Return in about 6 months (around 03/27/2023) for 15 min visit, after Cleda Daub and DLCO, ILD, with Dr Marchelle Gearing, Face to Face Visit.    SIGNATURE    Dr. Kalman Shan, M.D., F.C.C.P,  Pulmonary and Critical Care Medicine Staff Physician, [facility] Director - Interstitial Lung Disease  Program  Pulmonary Fibrosis [facility] at [facility] [place], [place], [address]  Pager: [phone], If no answer or between  15:00h - 7:00h: call [phone] Telephone: [phone]  9:30 AM 09/24/2022

## 2022-09-24 NOTE — Research (Signed)
Title: Chronic Fibrosing Interstitial Lung Disease with Progressive Phenotype Prospective Outcomes (ILD-PRO) Registry    Protocol #: IPF-PRO-SUB, Clinical Trials # [id number], Sponsor: [employer] Protocol Version: Protocol Amendment 6 Version dated 30Apr2024  IB: N/A   ICF: [facility] IRB Approved Version 22May2024 (Applicable for the new ILD-PRO enrolment and extended IPF-PRO cohort) Lab Manual: v2.0.0 dated 06Dec2021  Objectives:  Describe current approaches to diagnosis and treatment of chronic fibrosing ILDs with progressive phenotype  Describe the natural history of chronic fibrosing ILDs with progressive phenotype  Assess quality of life from self-administered participant reported questionnaires for each disease group  Describe participant interactions with the healthcare system, describe treatment practices across multiple institutions for each disease group  Collect biological samples linked to well characterized chronic fibrosing ILDs with progressive phenotype to identify disease biomarkers  Collect data and biological samples that will support future research studies.                                            Key Inclusion Criteria: Willing and able to provide informed consent  Age ? 30 years  Diagnosis of a non-IPF ILD of any duration, including, but not limited to Idiopathic Non-Specific Interstitial Pneumonia (INSIP), Unclassifiable Idiopathic Interstitial Pneumonias (IIPs), Interstitial Pneumonia with Autoimmune Features (IPAF), Autoimmune ILDs such as Rheumatoid Arthritis (RA-ILD) and Systemic Sclerosis (SSC-ILD), Chronic Hypersensitivity Pneumonitis (HP), Sarcoidosis or Exposure-related ILDs such as asbestosis.  Chronic fibrosing ILD defined by reticular abnormality with traction bronchiectasis with or without honeycombing confirmed by chest HRCT scan and/or lung biopsy.  Progressive phenotype as defined by fulfilling at least one of the criteria below  of fibrotic changes (progression set point) within the last 24 months regardless of treatment considered appropriate in individual ILDs:  decline in FVC % predicted (% pred) based on >10% relative decline  decline in FVC % pred based on ? 5 - <10% relative decline in FVC combined with worsening of respiratory symptoms as assessed by the site investigator  decline in FVC % pred based on ? 5 - <10% relative decline in FVC combined with increasing extent of fibrotic changes on chest imaging (HRCT scan) as assessed by the site investigator  decline in DLCO % pred based on ? 10% relative decline  worsening of respiratory symptoms as well as increasing extent of fibrotic changes on chest imaging (HRCT scan) as assessed by the site investigator independent of FVC change.     Key Exclusion Criteria: Malignancy, treated or untreated, other than skin or early stage prostate cancer, within the past 5 years  Currently listed for lung transplantation at the time of enrollment  Currently enrolled in a clinical trial at the time of enrollment in this registry       Clinical Research Coordinator / Research RN note : This visit for Nicholas Webb  Subject [id number] with DOB:08 08-19-1942on 24 September 2022 for the above protocol is Visit/Encounter 3 and is for purpose of research.    Subject expressed continued interest and consent in continuing as a study subject. Subject confirmed that there was no change in contact information (e.g. address, telephone, email). Subject thanked for participation in research and contribution to science.     During this visit on 24 September 2022 , the subject completed the blood work and questionnaires per the above referenced protocol. Please refer to the subject's paper source binder  for further details.   Signed by Neita Garnet Clinical Research Coordinator [facility]  [place], [place] 1:55 PM,24 September 2022

## 2022-09-28 DIAGNOSIS — I5032 Chronic diastolic (congestive) heart failure: Secondary | ICD-10-CM | POA: Diagnosis not present

## 2022-09-28 DIAGNOSIS — D333 Benign neoplasm of cranial nerves: Secondary | ICD-10-CM | POA: Diagnosis not present

## 2022-09-28 DIAGNOSIS — I700 Atherosclerosis of aorta: Secondary | ICD-10-CM | POA: Diagnosis not present

## 2022-09-28 DIAGNOSIS — M069 Rheumatoid arthritis, unspecified: Secondary | ICD-10-CM | POA: Diagnosis not present

## 2022-09-28 DIAGNOSIS — J849 Interstitial pulmonary disease, unspecified: Secondary | ICD-10-CM | POA: Diagnosis not present

## 2022-09-28 DIAGNOSIS — D84821 Immunodeficiency due to drugs: Secondary | ICD-10-CM | POA: Diagnosis not present

## 2022-09-28 DIAGNOSIS — D6869 Other thrombophilia: Secondary | ICD-10-CM | POA: Diagnosis not present

## 2022-09-28 DIAGNOSIS — R5383 Other fatigue: Secondary | ICD-10-CM | POA: Diagnosis not present

## 2022-10-12 DIAGNOSIS — B354 Tinea corporis: Secondary | ICD-10-CM | POA: Diagnosis not present

## 2022-10-14 DIAGNOSIS — M810 Age-related osteoporosis without current pathological fracture: Secondary | ICD-10-CM | POA: Diagnosis not present

## 2022-10-15 ENCOUNTER — Ambulatory Visit: Payer: Medicare PPO | Admitting: Podiatry

## 2022-10-22 ENCOUNTER — Ambulatory Visit: Payer: Medicare PPO | Admitting: Podiatry

## 2022-10-22 ENCOUNTER — Encounter: Payer: Self-pay | Admitting: Podiatry

## 2022-10-22 DIAGNOSIS — D689 Coagulation defect, unspecified: Secondary | ICD-10-CM

## 2022-10-22 DIAGNOSIS — B351 Tinea unguium: Secondary | ICD-10-CM

## 2022-10-22 DIAGNOSIS — M79676 Pain in unspecified toe(s): Secondary | ICD-10-CM | POA: Diagnosis not present

## 2022-10-22 NOTE — Progress Notes (Signed)
This patient returns to my office for at risk foot care.  This patient requires this care by a professional since this patient will be at risk due to having coagulation defect.  Patient is taking eliquis.  This patient is unable to cut nails himself since the patient cannot reach his nails.These nails are painful walking and wearing shoes.  This patient presents for at risk foot care today.  General Appearance  Alert, conversant and in no acute stress.  Vascular  Dorsalis pedis and posterior tibial  pulses are palpable  bilaterally.  Capillary return is within normal limits  bilaterally. Temperature is within normal limits  bilaterally.  Neurologic  Senn-Weinstein monofilament wire test within normal limits  bilaterally. Muscle power within normal limits bilaterally.  Nails Thick disfigured discolored nails with subungual debris  from hallux to fifth toes bilaterally. No evidence of bacterial infection or drainage bilaterally.  Orthopedic  No limitations of motion  feet .  No crepitus or effusions noted.  No bony pathology or digital deformities noted.  DJD 1st MPJ right foot. Bony prominence medial aspect both rearfeet.  Pes planus right foot.  Skin  normotropic skin with no porokeratosis noted bilaterally.  No signs of infections or ulcers noted.     Onychomycosis  Pain in right toes  Pain in left toes  Consent was obtained for treatment procedures.   Mechanical debridement of nails 1-5  bilaterally performed with a nail nipper.  Filed with dremel without incident.    Return office visit    3 months                 Told patient to return for periodic foot care and evaluation due to potential at risk complications.   Helane Gunther DPM

## 2022-11-05 DIAGNOSIS — L570 Actinic keratosis: Secondary | ICD-10-CM | POA: Diagnosis not present

## 2022-11-05 DIAGNOSIS — D0439 Carcinoma in situ of skin of other parts of face: Secondary | ICD-10-CM | POA: Diagnosis not present

## 2022-11-05 DIAGNOSIS — C44329 Squamous cell carcinoma of skin of other parts of face: Secondary | ICD-10-CM | POA: Diagnosis not present

## 2022-11-05 HISTORY — PX: SKIN CANCER EXCISION: SHX779

## 2022-11-16 ENCOUNTER — Ambulatory Visit: Payer: Medicare PPO | Admitting: Cardiology

## 2022-11-16 DIAGNOSIS — E559 Vitamin D deficiency, unspecified: Secondary | ICD-10-CM | POA: Diagnosis not present

## 2022-11-16 DIAGNOSIS — M8580 Other specified disorders of bone density and structure, unspecified site: Secondary | ICD-10-CM | POA: Diagnosis not present

## 2022-11-16 DIAGNOSIS — M79643 Pain in unspecified hand: Secondary | ICD-10-CM | POA: Diagnosis not present

## 2022-11-16 DIAGNOSIS — M62838 Other muscle spasm: Secondary | ICD-10-CM | POA: Diagnosis not present

## 2022-11-16 DIAGNOSIS — M0589 Other rheumatoid arthritis with rheumatoid factor of multiple sites: Secondary | ICD-10-CM | POA: Diagnosis not present

## 2022-11-16 DIAGNOSIS — M25431 Effusion, right wrist: Secondary | ICD-10-CM | POA: Diagnosis not present

## 2022-11-16 DIAGNOSIS — M150 Primary generalized (osteo)arthritis: Secondary | ICD-10-CM | POA: Diagnosis not present

## 2022-11-16 DIAGNOSIS — I2510 Atherosclerotic heart disease of native coronary artery without angina pectoris: Secondary | ICD-10-CM | POA: Diagnosis not present

## 2022-11-16 DIAGNOSIS — Z79899 Other long term (current) drug therapy: Secondary | ICD-10-CM | POA: Diagnosis not present

## 2022-11-23 ENCOUNTER — Telehealth: Payer: Self-pay | Admitting: Internal Medicine

## 2022-11-23 NOTE — Telephone Encounter (Signed)
Vernona Rieger daughter would like to know if ok for Dr. Kathi Ludwig to prescribe Methotrexate for patient. Vernona Rieger phone number is [phone].

## 2022-11-26 DIAGNOSIS — Z0000 Encounter for general adult medical examination without abnormal findings: Secondary | ICD-10-CM | POA: Diagnosis not present

## 2022-11-26 DIAGNOSIS — E785 Hyperlipidemia, unspecified: Secondary | ICD-10-CM | POA: Diagnosis not present

## 2022-11-26 DIAGNOSIS — D649 Anemia, unspecified: Secondary | ICD-10-CM | POA: Diagnosis not present

## 2022-11-26 DIAGNOSIS — R7303 Prediabetes: Secondary | ICD-10-CM | POA: Diagnosis not present

## 2022-11-26 DIAGNOSIS — I5032 Chronic diastolic (congestive) heart failure: Secondary | ICD-10-CM | POA: Diagnosis not present

## 2022-11-26 DIAGNOSIS — M069 Rheumatoid arthritis, unspecified: Secondary | ICD-10-CM | POA: Diagnosis not present

## 2022-11-26 DIAGNOSIS — G4733 Obstructive sleep apnea (adult) (pediatric): Secondary | ICD-10-CM | POA: Diagnosis not present

## 2022-11-26 DIAGNOSIS — C44319 Basal cell carcinoma of skin of other parts of face: Secondary | ICD-10-CM | POA: Diagnosis not present

## 2022-11-26 DIAGNOSIS — I2510 Atherosclerotic heart disease of native coronary artery without angina pectoris: Secondary | ICD-10-CM | POA: Diagnosis not present

## 2022-11-30 NOTE — Telephone Encounter (Signed)
It is okay to prescribe methotrexate.  The risk of lung toxicity is very low.  It does work well and rheumatoid arthritis for pain control and disease modulation

## 2022-12-01 ENCOUNTER — Encounter: Payer: Self-pay | Admitting: Internal Medicine

## 2022-12-02 NOTE — Telephone Encounter (Signed)
LVM (detailed) per MR recommendation for methotrexate.

## 2022-12-03 ENCOUNTER — Encounter: Payer: Self-pay | Admitting: Cardiology

## 2022-12-03 ENCOUNTER — Ambulatory Visit: Payer: Medicare PPO | Attending: Cardiology | Admitting: Cardiology

## 2022-12-03 VITALS — BP 112/64 | HR 71 | Ht 68.0 in | Wt 185.4 lb

## 2022-12-03 DIAGNOSIS — Z79899 Other long term (current) drug therapy: Secondary | ICD-10-CM | POA: Diagnosis not present

## 2022-12-03 DIAGNOSIS — D689 Coagulation defect, unspecified: Secondary | ICD-10-CM

## 2022-12-03 DIAGNOSIS — I2510 Atherosclerotic heart disease of native coronary artery without angina pectoris: Secondary | ICD-10-CM | POA: Diagnosis not present

## 2022-12-03 NOTE — Progress Notes (Signed)
Cardiology Office Note:  .   Date:  12/03/2022  ID:  Nicholas Webb, DOB 10/16/1940, MRN [id number] PCP: Camie Patience, FNP  [facility] Providers Cardiologist:  Donato Schultz, MD     History of Present Illness: .   Nicholas Webb is a 82 y.o. male Discussed with the use of AI scribe software  History of Present Illness   The patient is an 82 year old male with a history of coronary artery disease, interstitial lung disease, and a prior bilateral pulmonary embolism. In 2020, the patient underwent successful stenting of the proximal LAD, complicated by the discovery of a skull mass that subsequently decreased in size. The patient also had an IVC filter placed and subsequently removed due to a history of bilateral pulmonary embolism. The patient is on lifelong Eliquis 5mg  twice daily and is followed by hematology.  The patient also has a history of right bundle branch block, which has been stable. The patient is on atorvastatin 40mg  for hyperlipidemia and is followed by pulmonology for pulmonary fibrosis. On examination, crackles were heard in the lungs.  Recently, the patient has been feeling sluggish and less energetic than usual. The patient reports needing to rest frequently during activities.  The patient has a history of working at an [employer] ([employer]. The patient is not on aspirin due to being on Eliquis. The patient has a history of DVT in the lower extremity, and the IVC filter has been removed. The patient is followed by pulmonology for pulmonary fibrosis. The patient has a history of a skull mass that decreased in size on its own, possibly due to an infection. The patient had a high-resolution CT scan of the chest on 08/29/21, which showed progressively worsening interstitial lung disease.            Studies Reviewed: Marland Kitchen   EKG Interpretation Date/Time:  Thursday December 03 2022 14:05:17 EDT Ventricular Rate:  71 PR Interval:  138 QRS Duration:  122 QT  Interval:  408 QTC Calculation: 443 R Axis:   -13  Text Interpretation: Normal sinus rhythm Right bundle branch block When compared with ECG of 25-Jun-2018 05:33, Premature atrial complexes are no longer Present T wave inversion no longer evident in Anterior leads QT has shortened Confirmed by Donato Schultz ([id number]) on 12/03/2022 2:12:08 PM    Results LABS Hb: 11.8 (09/28/2022) LDL cholesterol: 49 (11/26/2022) HbA1c: 6.4 (11/26/2022)  RADIOLOGY High resolution CT scan of chest: Progressively worsening interstitial lung disease (08/29/2021)  DIAGNOSTIC Cardiac catheterization: Successful proximal LAD stent with otherwise mild nonobstructive disease elsewhere (06/24/2018) Echocardiogram: EF 60% with severely elevated systolic pressures in the 52 mmHg range (06/24/2018) EKG: Right bundle branch block, unchanged  Risk Assessment/Calculations:            Physical Exam:   VS:  BP 112/64   Pulse 71   Ht 5\' 8"  (1.727 m)   Wt 185 lb 6.4 oz (84.1 kg)   SpO2 90%   BMI 28.19 kg/m    Wt Readings from Last 3 Encounters:  12/03/22 185 lb 6.4 oz (84.1 kg)  09/24/22 186 lb 6.4 oz (84.6 kg)  07/16/22 192 lb (87.1 kg)    GEN: Well nourished, well developed in no acute distress NECK: No JVD; No carotid bruits CARDIAC: RRR, no murmurs, no rubs, no gallops RESPIRATORY: Mild crackles bilaterally, interstitial fibrosis ABDOMEN: Soft, non-tender, non-distended EXTREMITIES:  No edema; No deformity   ASSESSMENT AND PLAN: .    Assessment and Plan    Coronary  Artery Disease LAD stent placed in 2020, currently on Atorvastatin 40mg  daily. LDL cholesterol at 49, which is protective for the stent. -Continue Atorvastatin 40mg  daily.  Pulmonary Fibrosis Progressive worsening noted on CT scan. Patient reports feeling sluggish and needing to rest frequently. Crackles heard on exam. -Follow-up with Pulmonology in December.  Anemia Hemoglobin at 11.8 in July, borderline anemic. Patient reports feeling  sluggish. -Order CBC to check current hemoglobin levels.  Atrial Fibrillation/PE History of bilateral PE, treated with lifelong Eliquis 5mg  twice daily. No major bleeding reported. -Continue Eliquis 5mg  twice daily.  Right Bundle Branch Block Stable on EKG. -No changes to current management.  Follow-up in 1 year.               Signed, Donato Schultz, MD

## 2022-12-03 NOTE — Patient Instructions (Signed)
Medication Instructions:  The current medical regimen is effective;  continue present plan and medications.  *If you need a refill on your cardiac medications before your next appointment, please call your pharmacy*   Lab Work: Please have blood work today (CBC, BMP)  If you have labs (blood work) drawn today and your tests are completely normal, you will receive your results only by: MyChart Message (if you have [place]) OR A paper copy in the mail If you have any lab test that is abnormal or we need to change your treatment, we will call you to review the results.  Follow-Up: At [facility], you and your health needs are our priority.  As part of our continuing mission to provide you with exceptional heart care, we have created designated Provider Care Teams.  These Care Teams include your primary Cardiologist (physician) and Advanced Practice Providers (APPs -  Physician Assistants and Nurse Practitioners) who all work together to provide you with the care you need, when you need it.  We recommend signing up for the patient portal called "MyChart".  Sign up information is provided on this After Visit Summary.  MyChart is used to connect with patients for Virtual Visits (Telemedicine).  Patients are able to view lab/test results, encounter notes, upcoming appointments, etc.  Non-urgent messages can be sent to your provider as well.   To learn more about what you can do with MyChart, go to [email or web address].    Your next appointment:   1 year(s)  Provider:   Candee Furbish, MD

## 2022-12-04 LAB — CBC
Hematocrit: 36.7 % — ABNORMAL LOW (ref 37.5–51.0)
Hemoglobin: 12.1 g/dL — ABNORMAL LOW (ref 13.0–17.7)
MCH: 34.1 pg — ABNORMAL HIGH (ref 26.6–33.0)
MCHC: 33 g/dL (ref 31.5–35.7)
MCV: 103 fL — ABNORMAL HIGH (ref 79–97)
Platelets: 214 10*3/uL (ref 150–450)
RBC: 3.55 x10E6/uL — ABNORMAL LOW (ref 4.14–5.80)
RDW: 14.2 % (ref 11.6–15.4)
WBC: 9.5 10*3/uL (ref 3.4–10.8)

## 2022-12-04 LAB — BASIC METABOLIC PANEL
BUN/Creatinine Ratio: 21 (ref 10–24)
BUN: 21 mg/dL (ref 8–27)
CO2: 23 mmol/L (ref 20–29)
Calcium: 9.5 mg/dL (ref 8.6–10.2)
Chloride: 101 mmol/L (ref 96–106)
Creatinine, Ser: 0.99 mg/dL (ref 0.76–1.27)
Glucose: 110 mg/dL — ABNORMAL HIGH (ref 70–99)
Potassium: 4.5 mmol/L (ref 3.5–5.2)
Sodium: 140 mmol/L (ref 134–144)
eGFR: 77 mL/min/{1.73_m2} (ref >59)

## 2022-12-04 NOTE — Telephone Encounter (Signed)
  What we have realized in the last few to several years is that the concern for methotrexate related lung injury is overblown.  The incidence of methotrexate lung toxicity is around 0.25% or less.  Therefore I think it is okay to start methotrexate.  .  When I saw him in July 2024 recommended a 74-month visit.  So please make sure he is having a breathing test and a visit by December 2024/January 2025      Latest Ref Rng & Units 03/31/2022    9:22 AM 10/13/2021    2:50 PM 03/27/2021    9:38 AM 01/16/2021    9:36 AM 06/17/2020    9:31 AM  PFT Results  FVC-Pre L 3.56  3.64  3.61  3.56  3.71   FVC-Predicted Pre % 96  98  96  95  98   FVC-Post L     3.75   FVC-Predicted Post %     99   Pre FEV1/FVC % % 81  82  82  81  81   Post FEV1/FCV % %     84   FEV1-Pre L 2.88  2.98  2.95  2.88  3.01   FEV1-Predicted Pre % 109  113  110  108  111   FEV1-Post L     3.14   DLCO uncorrected ml/min/mmHg 15.73  15.96  15.93  15.03  18.32   DLCO UNC% % 68  69  69  65  78   DLCO corrected ml/min/mmHg 15.73  15.96  15.93  15.03  18.32   DLCO COR %Predicted % 68  69  69  65  78   DLVA Predicted % 83  78  80  78  96   TLC L     5.30   TLC % Predicted %     79   RV % Predicted %     67

## 2022-12-07 DIAGNOSIS — Z4802 Encounter for removal of sutures: Secondary | ICD-10-CM | POA: Diagnosis not present

## 2022-12-08 DIAGNOSIS — G4733 Obstructive sleep apnea (adult) (pediatric): Secondary | ICD-10-CM | POA: Diagnosis not present

## 2022-12-17 DIAGNOSIS — Z85828 Personal history of other malignant neoplasm of skin: Secondary | ICD-10-CM | POA: Diagnosis not present

## 2022-12-17 DIAGNOSIS — Z08 Encounter for follow-up examination after completed treatment for malignant neoplasm: Secondary | ICD-10-CM | POA: Diagnosis not present

## 2022-12-17 DIAGNOSIS — L814 Other melanin hyperpigmentation: Secondary | ICD-10-CM | POA: Diagnosis not present

## 2022-12-17 DIAGNOSIS — L570 Actinic keratosis: Secondary | ICD-10-CM | POA: Diagnosis not present

## 2022-12-17 DIAGNOSIS — L578 Other skin changes due to chronic exposure to nonionizing radiation: Secondary | ICD-10-CM | POA: Diagnosis not present

## 2022-12-17 DIAGNOSIS — D225 Melanocytic nevi of trunk: Secondary | ICD-10-CM | POA: Diagnosis not present

## 2022-12-17 DIAGNOSIS — C44329 Squamous cell carcinoma of skin of other parts of face: Secondary | ICD-10-CM | POA: Diagnosis not present

## 2022-12-17 DIAGNOSIS — D044 Carcinoma in situ of skin of scalp and neck: Secondary | ICD-10-CM | POA: Diagnosis not present

## 2022-12-17 DIAGNOSIS — L821 Other seborrheic keratosis: Secondary | ICD-10-CM | POA: Diagnosis not present

## 2022-12-17 DIAGNOSIS — D1801 Hemangioma of skin and subcutaneous tissue: Secondary | ICD-10-CM | POA: Diagnosis not present

## 2022-12-31 ENCOUNTER — Ambulatory Visit: Payer: Medicare PPO | Admitting: Oncology

## 2023-01-07 DIAGNOSIS — D485 Neoplasm of uncertain behavior of skin: Secondary | ICD-10-CM | POA: Diagnosis not present

## 2023-01-07 DIAGNOSIS — D044 Carcinoma in situ of skin of scalp and neck: Secondary | ICD-10-CM | POA: Diagnosis not present

## 2023-01-12 ENCOUNTER — Telehealth: Payer: Self-pay

## 2023-01-12 NOTE — Telephone Encounter (Signed)
11/12 @ 10:18 am Patient not available at this time requested call back by 4:00 pm.

## 2023-01-12 NOTE — Telephone Encounter (Signed)
11/12 @ 8:39 am received referral, but need colored photos for diagnosis.  Left voicemail with Dr. Lowry Bowl referral coordinator Julien Girt.  Waiting on call back.

## 2023-01-13 NOTE — Progress Notes (Signed)
Histology and Location of Primary Skin Cancer: Left Cheek- Squamous Cell  Past/Anticipated interventions by patient's surgeon/dermatologist for current problematic lesion, if any:  Dr. Jeannine Boga -[facility] 11/05/2022 Left Inferior Medial Malar Cheek -[facility] 11/05/2022 Left Central Buccal Cheek    Past skin cancers, if any: 1) Location/Histology/Intervention: Right Scalp excision 01/2023. 2) Location/Histology/Intervention:  3) Location/Histology/Intervention:   History of Blistering sunburns, if any: Yes  SAFETY ISSUES: Prior radiation? No Pacemaker/ICD? No Possible current pregnancy? N/a Is the patient on methotrexate?   Current Complaints / other details:

## 2023-01-14 ENCOUNTER — Ambulatory Visit
Admission: RE | Admit: 2023-01-14 | Discharge: 2023-01-14 | Disposition: A | Discharge status: Home / Self Care | Payer: Medicare PPO | Source: Ambulatory Visit | Attending: Radiation Oncology | Admitting: Radiation Oncology

## 2023-01-14 ENCOUNTER — Encounter: Payer: Self-pay | Admitting: Radiation Oncology

## 2023-01-14 ENCOUNTER — Inpatient Hospital Stay: Payer: Medicare PPO | Attending: Oncology | Admitting: Oncology

## 2023-01-14 VITALS — BP 128/72 | HR 63 | Temp 98.0°F | Resp 18 | Ht 68.0 in | Wt 189.8 lb

## 2023-01-14 DIAGNOSIS — C44320 Squamous cell carcinoma of skin of unspecified parts of face: Secondary | ICD-10-CM | POA: Insufficient documentation

## 2023-01-14 DIAGNOSIS — Z85828 Personal history of other malignant neoplasm of skin: Secondary | ICD-10-CM | POA: Diagnosis not present

## 2023-01-14 DIAGNOSIS — Z800 Family history of malignant neoplasm of digestive organs: Secondary | ICD-10-CM | POA: Insufficient documentation

## 2023-01-14 DIAGNOSIS — D7589 Other specified diseases of blood and blood-forming organs: Secondary | ICD-10-CM | POA: Insufficient documentation

## 2023-01-14 DIAGNOSIS — K219 Gastro-esophageal reflux disease without esophagitis: Secondary | ICD-10-CM | POA: Insufficient documentation

## 2023-01-14 DIAGNOSIS — Z79624 Long term (current) use of inhibitors of nucleotide synthesis: Secondary | ICD-10-CM | POA: Insufficient documentation

## 2023-01-14 DIAGNOSIS — Z8042 Family history of malignant neoplasm of prostate: Secondary | ICD-10-CM | POA: Insufficient documentation

## 2023-01-14 DIAGNOSIS — Z79631 Long term (current) use of antimetabolite agent: Secondary | ICD-10-CM | POA: Diagnosis not present

## 2023-01-14 DIAGNOSIS — Z79899 Other long term (current) drug therapy: Secondary | ICD-10-CM | POA: Diagnosis not present

## 2023-01-14 DIAGNOSIS — Z8719 Personal history of other diseases of the digestive system: Secondary | ICD-10-CM | POA: Insufficient documentation

## 2023-01-14 DIAGNOSIS — I214 Non-ST elevation (NSTEMI) myocardial infarction: Secondary | ICD-10-CM | POA: Diagnosis not present

## 2023-01-14 DIAGNOSIS — Z7952 Long term (current) use of systemic steroids: Secondary | ICD-10-CM | POA: Insufficient documentation

## 2023-01-14 DIAGNOSIS — Z809 Family history of malignant neoplasm, unspecified: Secondary | ICD-10-CM | POA: Insufficient documentation

## 2023-01-14 DIAGNOSIS — Z823 Family history of stroke: Secondary | ICD-10-CM | POA: Diagnosis not present

## 2023-01-14 DIAGNOSIS — I6523 Occlusion and stenosis of bilateral carotid arteries: Secondary | ICD-10-CM | POA: Insufficient documentation

## 2023-01-14 DIAGNOSIS — Z83511 Family history of glaucoma: Secondary | ICD-10-CM | POA: Insufficient documentation

## 2023-01-14 DIAGNOSIS — Z86718 Personal history of other venous thrombosis and embolism: Secondary | ICD-10-CM | POA: Insufficient documentation

## 2023-01-14 DIAGNOSIS — Z860101 Personal history of adenomatous and serrated colon polyps: Secondary | ICD-10-CM | POA: Diagnosis not present

## 2023-01-14 DIAGNOSIS — I2699 Other pulmonary embolism without acute cor pulmonale: Secondary | ICD-10-CM | POA: Insufficient documentation

## 2023-01-14 DIAGNOSIS — K2270 Barrett's esophagus without dysplasia: Secondary | ICD-10-CM | POA: Insufficient documentation

## 2023-01-14 DIAGNOSIS — G4730 Sleep apnea, unspecified: Secondary | ICD-10-CM | POA: Insufficient documentation

## 2023-01-14 DIAGNOSIS — D638 Anemia in other chronic diseases classified elsewhere: Secondary | ICD-10-CM | POA: Insufficient documentation

## 2023-01-14 DIAGNOSIS — M069 Rheumatoid arthritis, unspecified: Secondary | ICD-10-CM | POA: Insufficient documentation

## 2023-01-14 DIAGNOSIS — Z7901 Long term (current) use of anticoagulants: Secondary | ICD-10-CM | POA: Insufficient documentation

## 2023-01-14 MED ORDER — APIXABAN 5 MG PO TABS: 5.0000 mg | ORAL_TABLET | Freq: Two times a day (BID) | ORAL | 11 refills | Status: DC

## 2023-01-14 NOTE — Progress Notes (Signed)
[facility] OFFICE PROGRESS NOTE   Diagnosis: DVT/pulmonary embolism  INTERVAL HISTORY:   Nicholas Webb returns as scheduled.  He generally feels well.  No bleeding or symptom of recurrent thrombosis.  He had skin cancers removed from the parietal scalp and left cheek areas recently.  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  He has been referred to Nicholas Webb to consider radiation.  Objective:  Vital signs in last 24 hours:  Blood pressure 128/72, pulse 63, temperature 98 F (36.7 C), temperature source Temporal, resp. rate 18, height 5\' 8"  (1.727 m), weight 189 lb 12.8 oz (86.1 kg), SpO2 96%.    Lymphatics: No cervical, supraclavicular, axillary, or inguinal nodes Resp: Inspiratory rales at the lower posterior chest bilaterally Cardio: Regular rate and rhythm GI: No hepatosplenomegaly Vascular: No leg edema  Skin: Surgical defect at the parietal scalp, left malar scar   Lab Results:  Lab Results  Component Value Date   WBC 9.5 12/03/2022   HGB 12.1 (L) 12/03/2022   HCT 36.7 (L) 12/03/2022   MCV 103 (H) 12/03/2022   PLT 214 12/03/2022   NEUTROABS 5.7 06/23/2018    CMP  Lab Results  Component Value Date   NA 140 12/03/2022   K 4.5 12/03/2022   CL 101 12/03/2022   CO2 23 12/03/2022   GLUCOSE 110 (H) 12/03/2022   BUN 21 12/03/2022   CREATININE 0.99 12/03/2022   CALCIUM 9.5 12/03/2022   PROT 6.4 (L) 06/24/2018   ALBUMIN 3.2 (L) 06/24/2018   AST 40 06/24/2018   ALT 49 (H) 06/24/2018   ALKPHOS 67 06/24/2018   BILITOT 0.8 06/24/2018   GFRNONAA >60 10/07/2018   GFRAA >60 10/07/2018    Lab Results  Component Value Date   CEA1 1.4 06/27/2018    Lab Results  Component Value Date   INR 1.2 10/07/2018   LABPROT 15.2 10/07/2018    Imaging:  No results found.  Medications: I have reviewed the patient's current medications.   Assessment/Plan: Skull mass, chest lymphadenopathy MRI brain  06/24/2018-posterior vertex infiltrated and poorly marginated mass with extending into the underlying pachymeningeal's and into the scalp, the mass invades and narrows the superior sagittal sinus CTs of the chest, abdomen, and pelvis on 06/25/2018-acute bilateral pulmonary emboli with a large clot burden, necrotic mediastinal adenopathy, 8 mm pleural-based left lower lobe nodule, 4 mm left upper lobe nodules, chronic pulmonary fibrosis PET scan 07/06/2018- no evidence of a primary malignancy, small pulmonary nodules are not metabolically active, mildly enlarged/mildly metabolic mediastinal nodes favored to be reactive MRI brain 10/10/2018-progression of calvarial lesion at the vertex, stable 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  Pulmonary fibrosis.  Multiple nonspecific pulmonary nodules CT chest 08/29/2021-progressive interstitial lung disease, stable pulmonary nodules, no large mediastinal or hilar nodes 2.  NSTEMI-cardiac catheterization 06/24/2018 with 99% LAD stenosis, status post placement of a drug-eluting stent 3.  Bilateral pulmonary emboli on CT 06/25/2018- treated with heparin, now maintained on apixaban Doppler 06/27/2018- acute left femoral vein thrombosis   4.  History of gastroesophageal reflux disease and Barrett's esophagus 5.  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  Rheumatoid arthritis, treated with Humira and methotrexate, currently treated with Imuran and prednisone 7.  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  Pulmonary fibrosis noted on CT 06/25/2018 9.  Squamous cell carcinoma left cheek, "skin cancer "parietal scalp 2024        Disposition: Mr.  Webb appears stable.  He will continue indefinite anticoagulation therapy.  He will follow-up with dermatology and Nicholas Webb for management of the skin cancers.  I am available to see him as needed.  He will return for an office visit in 1 year.  Nicholas Papas, MD  01/14/2023  9:20 AM

## 2023-01-14 NOTE — Progress Notes (Addendum)
Radiation Oncology         [phone] ________________________________  Name: Nicholas Webb        MRN: [id number]  Date of Service: 01/14/2023 DOB: 11-Feb-1941  ZH:[id number], Natale Lay, FNP  Nicholas Brink, MD     REFERRING PHYSICIAN: Glennis Brink, MD   DIAGNOSIS: The encounter diagnosis was Squamous cell carcinoma of skin of face.   HISTORY OF PRESENT ILLNESS: Nicholas Webb is a 82 y.o. male seen at the request of Nicholas Webb for a diagnosis of skin cancer. He has had surgical resection and topical 5FU to multiple skin cancers in the last 15 years. More recently the patient was found to have two lesions  on the left cheek area that was biopsied and found to be squamous cell carcinoma of the skin by Nicholas Webb and was referred to Nicholas Webb and underwent Mohs' surgery on 11/05/22. Final pathology showed invasive squamous cell carcinoma with up to .2 mm of perineural involvement. Given the high risk features on pathology, he's seen to consider adjuvant radiotherapy to reduce risks of local recurrence. Of note he also just had a biopsy for an area at the zenith of his scalp on the right, and is waiting on that pathology result.     PREVIOUS RADIATION THERAPY: No   PAST MEDICAL HISTORY:  Past Medical History:  Diagnosis Date   Anemia of chronic disease    Atherosclerosis of both carotid arteries    Atypical mole 03/28/2013   moderate atypia on mid back   Barrett's esophagus    Basal cell carcinoma 12/17/2015   superficial on right postauricular - CX3+5FU   Dizziness    GERD (gastroesophageal reflux disease)    Hx of adenomatous colonic polyps    Hyperlipidemia    Macrocytosis without anemia    Obesity    OSA (obstructive sleep apnea)    Rheumatoid arthritis(714.0)    Squamous cell carcinoma of skin 02/25/2010   in situ on left sideburn, inferior - CX3+5FU   Squamous cell carcinoma of skin 02/25/2010   well differentiated on right sideburn - CX3+5FU   Squamous cell  carcinoma of skin 03/24/2011   in situ on right temple - CX3+5FU   Squamous cell carcinoma of skin 03/24/2011   in situ on right jawline - CX3+5FU   Squamous cell carcinoma of skin 03/24/2011   in situ on right angle of jaw - CX3+5FU   Squamous cell carcinoma of skin 05/05/2011   left temple - CX3+5FU   Squamous cell carcinoma of skin 11/28/2013   in situ on mid scalp - tx p bx   Squamous cell carcinoma of skin 12/17/2015   in situ on right post crown - CX3+5FU   Squamous cell carcinoma of skin 12/17/2015   verruca vulgaris with squamous atypia on right thumb - CX3+5FU   Squamous cell carcinoma of skin 01/03/2016   in situ on right crown post inf   Squamous cell carcinoma of skin 04/22/2016   well differentiated on left outer low leg   Squamous cell carcinoma of skin 10/06/2016   in situ on right subauricular - CX3+Cautery+5FU   Squamous cell carcinoma of skin 12/22/2016   in situ on mid post crown - tx p bx   Squamous cell carcinoma of skin 01/11/2018   in situ on right crown - tx p bx   Squamous cell carcinoma of skin 01/11/2018   in situ on mid crown - tx p bx   Squamous cell carcinoma  of skin 01/11/2018   in situ on left crown - tx p bx   Squamous cell carcinoma of skin 01/11/2018   well differentiated on right temple - tx p bx   Squamous cell carcinoma of skin 03/07/2019   well differentiated on right neck - tx p bx   Squamous cell carcinoma of skin 03/07/2019   in situ on right temple - tx p bx   Squamous cell carcinoma of skin 03/07/2019   in situ on mid forehead - tx p bx   Squamous cell carcinoma of skin 01/04/2020   well diff- mid parietal scalp (CX35FU)   Squamous cell carcinoma of skin 01/04/2020   in situ- right scalp superior (CX35FU)   Squamous cell carcinoma of skin 01/04/2020   in situ-legt parietal scalp (CX35FU)    Syncope        PAST SURGICAL HISTORY: Past Surgical History:  Procedure Laterality Date   CORONARY STENT INTERVENTION N/A 06/24/2018    Procedure: CORONARY STENT INTERVENTION;  Surgeon: Kathleene Hazel, MD;  Location: [facility];  Service: Cardiovascular;  Laterality: N/A;   IR IVC FILTER PLMT / S&I /IMG GUID/MOD SED  10/07/2018   IR IVC FILTER RETRIEVAL / S&I /IMG GUID/MOD SED  06/15/2019   IR RADIOLOGIST EVAL & MGMT  09/27/2018   IR RADIOLOGIST EVAL & MGMT  01/04/2019   JOINT REPLACEMENT  2004   left   LEFT HEART CATH AND CORONARY ANGIOGRAPHY N/A 06/24/2018   Procedure: LEFT HEART CATH AND CORONARY ANGIOGRAPHY;  Surgeon: Kathleene Hazel, MD;  Location: [facility];  Service: Cardiovascular;  Laterality: N/A;   SKIN CANCER EXCISION Left 11/05/2022   Left Cheek     FAMILY HISTORY:  Family History  Problem Relation Age of Onset   Stroke Mother    Glaucoma Mother    Cancer Father    Prostate cancer Father    Colon cancer Father      SOCIAL HISTORY:  reports that he has never smoked. He has never used smokeless tobacco. He reports current alcohol use of about 1.0 standard drink of alcohol per week. He reports that he does not use drugs. The patient is married and lives in [place]. His daughter Nicholas Webb is a [facility] NP and accompanies the patient as well.    ALLERGIES: Patient has no known allergies.   MEDICATIONS:  Current Outpatient Medications  Medication Sig Dispense Refill   apixaban (ELIQUIS) 5 MG TABS tablet Take 1 tablet (5 mg total) by mouth 2 (two) times daily. 60 tablet 11   atorvastatin (LIPITOR) 40 MG tablet Take 1 tablet (40 mg total) by mouth daily. Pt needs to keep upcoming appt in Nov for further refills 90 tablet 1   azaTHIOprine (IMURAN) 50 MG tablet Take 50 mg by mouth 2 (two) times daily.     denosumab (PROLIA) 60 MG/ML SOSY injection Inject 60 mg into the skin every 6 (six) months.     esomeprazole (NEXIUM) 20 MG capsule Take 20 mg by mouth daily at 12 noon.      folic acid (FOLVITE) 1 MG tablet Take 1 mg by mouth daily. 1-2 daily     methotrexate  (RHEUMATREX) 2.5 MG tablet Take 10 mg by mouth once a week.     Multiple Vitamins-Minerals (MULTIVITAMIN WITH MINERALS) tablet Take 1 tablet by mouth daily.     Niacinamide-Zn-Cu-Methfo-Se-Cr (NICOTINAMIDE PO) Take 500 mg by mouth.     predniSONE (DELTASONE) 5 MG tablet Take 5  mg by mouth daily with breakfast. Dr Kathi Ludwig     PREVIDENT 5000 PLUS 1.1 % CREA dental cream Place 1 Application onto teeth as directed.     No current facility-administered medications for this encounter.    REVIEW OF SYSTEMS: On review of systems, the patient reports that he is doing well since surgery and feels like he's healing well without complaints.      PHYSICAL EXAM:  Wt Readings from Last 3 Encounters:  01/14/23 189 lb 12.8 oz (86.1 kg)  12/03/22 185 lb 6.4 oz (84.1 kg)  09/24/22 186 lb 6.4 oz (84.6 kg)   Temp Readings from Last 3 Encounters:  01/14/23 98 F (36.7 C) (Temporal)  03/31/22 97.7 F (36.5 C) (Oral)  12/30/21 97.8 F (36.6 C) (Oral)   BP Readings from Last 3 Encounters:  01/14/23 128/72  12/03/22 112/64  09/24/22 104/60   Pulse Readings from Last 3 Encounters:  01/14/23 63  12/03/22 71  09/24/22 68   Pain Assessment Pain Score: 0-No pain/10  In general this is a well appearing caucasian male in no acute distress. He's alert and oriented x4 and appropriate throughout the examination. Cardiopulmonary assessment is negative for acute distress and he exhibits normal effort. He does have postoperative changes in the left malar cheek region in to sites as seen below with small eschar still in place.   Preoperative Images    Post Mohs' Images   Images from clinic 01/14/23    ECOG = 0  0 - Asymptomatic (Fully active, able to carry on all predisease activities without restriction)  1 - Symptomatic but completely ambulatory (Restricted in physically strenuous activity but ambulatory and able to carry out work of a light or sedentary nature. For example, light housework, office  work)  2 - Symptomatic, <50% in bed during the day (Ambulatory and capable of all self care but unable to carry out any work activities. Up and about more than 50% of waking hours)  3 - Symptomatic, >50% in bed, but not bedbound (Capable of only limited self-care, confined to bed or chair 50% or more of waking hours)  4 - Bedbound (Completely disabled. Cannot carry on any self-care. Totally confined to bed or chair)  5 - Death   Santiago Glad MM, Creech RH, Tormey DC, et al. [phone]). "Toxicity and response criteria of the [facility]". Am. Evlyn Clines. Oncol. 5 (6): 649-55    LABORATORY DATA:  Lab Results  Component Value Date   WBC 9.5 12/03/2022   HGB 12.1 (L) 12/03/2022   HCT 36.7 (L) 12/03/2022   MCV 103 (H) 12/03/2022   PLT 214 12/03/2022   Lab Results  Component Value Date   NA 140 12/03/2022   K 4.5 12/03/2022   CL 101 12/03/2022   CO2 23 12/03/2022   Lab Results  Component Value Date   ALT 49 (H) 06/24/2018   AST 40 06/24/2018   ALKPHOS 67 06/24/2018   BILITOT 0.8 06/24/2018      RADIOGRAPHY: No results found.     IMPRESSION/PLAN: 1. History of multifocal squamous cell carcinoma of the skin. Dr. Mitzi Hansen discusses the pathology findings and reviews the nature of locally advanced skin disease. Given the perineural findings, that is felt to be high risk for recurrence, and Dr. Mitzi Hansen recommends adjuvant radiotherapy to reduce risks of local recurrence.  We discussed the risks, benefits, short, and long term effects of radiotherapy, as well as the curative intent, and the patient is interested in proceeding.  Dr. Mitzi Hansen discusses the delivery and logistics of radiotherapy and anticipates a course of 4 weeks of electron radiotherapy to the left cheek. We will also follow up with the results of his pathology results to the right upper scalp. Written consent is obtained and placed in the chart, a copy was provided to the patient. The patient will be contacted to  coordinate treatment planning by our simulation department.  2. Rheumatoid Arthritis. The patient is taking methotrexate and will stop this 1 week before he starts radiation and the wait to resume until after radiation.   In a visit lasting 60 minutes, greater than 50% of the time was spent face to face discussing the patient's condition, in preparation for the discussion, and coordinating the patient's care.   The above documentation reflects my direct findings during this shared patient visit. Please see the separate note by Dr. Mitzi Hansen on this date for the remainder of the patient's plan of care.    Osker Mason, [facility]   **Disclaimer: This note was dictated with voice recognition software. Similar sounding words can inadvertently be transcribed and this note may contain transcription errors which may not have been corrected upon publication of note.**

## 2023-01-14 NOTE — Addendum Note (Signed)
Encounter addended by: Ronny Bacon, PA-C on: 01/14/2023 1:34 PM  Actions taken: Clinical Note Signed

## 2023-01-18 ENCOUNTER — Ambulatory Visit: Payer: Medicare PPO | Admitting: Cardiology

## 2023-01-18 DIAGNOSIS — I776 Arteritis, unspecified: Secondary | ICD-10-CM | POA: Diagnosis not present

## 2023-01-18 DIAGNOSIS — M8580 Other specified disorders of bone density and structure, unspecified site: Secondary | ICD-10-CM | POA: Diagnosis not present

## 2023-01-18 DIAGNOSIS — I2510 Atherosclerotic heart disease of native coronary artery without angina pectoris: Secondary | ICD-10-CM | POA: Diagnosis not present

## 2023-01-18 DIAGNOSIS — M62838 Other muscle spasm: Secondary | ICD-10-CM | POA: Diagnosis not present

## 2023-01-18 DIAGNOSIS — M0589 Other rheumatoid arthritis with rheumatoid factor of multiple sites: Secondary | ICD-10-CM | POA: Diagnosis not present

## 2023-01-18 DIAGNOSIS — E559 Vitamin D deficiency, unspecified: Secondary | ICD-10-CM | POA: Diagnosis not present

## 2023-01-18 DIAGNOSIS — Z79899 Other long term (current) drug therapy: Secondary | ICD-10-CM | POA: Diagnosis not present

## 2023-01-18 DIAGNOSIS — M150 Primary generalized (osteo)arthritis: Secondary | ICD-10-CM | POA: Diagnosis not present

## 2023-01-18 DIAGNOSIS — M79643 Pain in unspecified hand: Secondary | ICD-10-CM | POA: Diagnosis not present

## 2023-01-22 ENCOUNTER — Encounter: Payer: Self-pay | Admitting: Podiatry

## 2023-01-22 ENCOUNTER — Ambulatory Visit: Payer: Medicare PPO | Admitting: Podiatry

## 2023-01-22 DIAGNOSIS — D689 Coagulation defect, unspecified: Secondary | ICD-10-CM

## 2023-01-22 DIAGNOSIS — B351 Tinea unguium: Secondary | ICD-10-CM | POA: Diagnosis not present

## 2023-01-22 DIAGNOSIS — M79676 Pain in unspecified toe(s): Secondary | ICD-10-CM

## 2023-01-22 NOTE — Progress Notes (Addendum)
This patient returns to my office for at risk foot care.  This patient requires this care by a professional since this patient will be at risk due to having coagulation defect.  Patient is taking eliquis.  This patient is unable to cut nails himself since the patient cannot reach his nails.These nails are painful walking and wearing shoes.  This patient presents for at risk foot care today.  General Appearance  Alert, conversant and in no acute stress.  Vascular  Dorsalis pedis and posterior tibial  pulses are palpable  bilaterally.  Capillary return is within normal limits  bilaterally. Temperature is within normal limits  bilaterally.  Neurologic  Senn-Weinstein monofilament wire test within normal limits  bilaterally. Muscle power within normal limits bilaterally.  Nails Thick disfigured discolored nails with subungual debris  from hallux to fifth toes bilaterally. No evidence of bacterial infection or drainage bilaterally.  Orthopedic  No limitations of motion  feet .  No crepitus or effusions noted.  No bony pathology or digital deformities noted.  DJD 1st MPJ right foot. Bony prominence medial aspect both rearfeet.  Pes planus right foot.  Skin  normotropic skin with no porokeratosis noted bilaterally.  No signs of infections or ulcers noted.     Onychomycosis  Pain in right toes  Pain in left toes  Consent was obtained for treatment procedures.   Mechanical debridement of nails 1-5  bilaterally performed with a nail nipper.  Filed with dremel without incident.    Return office visit    10 weeks                  Told patient to return for periodic foot care and evaluation due to potential at risk complications.   Helane Gunther DPM

## 2023-02-01 DIAGNOSIS — G4733 Obstructive sleep apnea (adult) (pediatric): Secondary | ICD-10-CM | POA: Diagnosis not present

## 2023-02-05 DIAGNOSIS — L821 Other seborrheic keratosis: Secondary | ICD-10-CM | POA: Diagnosis not present

## 2023-02-05 DIAGNOSIS — S0100XA Unspecified open wound of scalp, initial encounter: Secondary | ICD-10-CM | POA: Diagnosis not present

## 2023-02-05 DIAGNOSIS — D044 Carcinoma in situ of skin of scalp and neck: Secondary | ICD-10-CM | POA: Diagnosis not present

## 2023-02-11 ENCOUNTER — Ambulatory Visit
Admission: RE | Admit: 2023-02-11 | Discharge: 2023-02-11 | Disposition: A | Discharge status: Home / Self Care | Payer: Medicare PPO | Source: Ambulatory Visit | Attending: Radiation Oncology | Admitting: Radiation Oncology

## 2023-02-11 DIAGNOSIS — C44320 Squamous cell carcinoma of skin of unspecified parts of face: Secondary | ICD-10-CM | POA: Diagnosis not present

## 2023-02-18 ENCOUNTER — Ambulatory Visit: Payer: Medicare PPO | Admitting: Radiation Oncology

## 2023-02-18 DIAGNOSIS — C44320 Squamous cell carcinoma of skin of unspecified parts of face: Secondary | ICD-10-CM | POA: Diagnosis not present

## 2023-02-19 ENCOUNTER — Ambulatory Visit: Payer: Medicare PPO

## 2023-02-22 ENCOUNTER — Ambulatory Visit: Payer: Medicare PPO

## 2023-02-23 ENCOUNTER — Ambulatory Visit: Payer: Medicare PPO

## 2023-02-25 ENCOUNTER — Ambulatory Visit: Payer: Medicare PPO

## 2023-02-26 ENCOUNTER — Ambulatory Visit: Payer: Medicare PPO

## 2023-02-28 ENCOUNTER — Other Ambulatory Visit: Payer: Self-pay | Admitting: Oncology

## 2023-03-01 ENCOUNTER — Ambulatory Visit: Payer: Medicare PPO

## 2023-03-02 ENCOUNTER — Ambulatory Visit: Payer: Medicare PPO

## 2023-03-04 ENCOUNTER — Ambulatory Visit: Payer: Medicare PPO | Admitting: Radiation Oncology

## 2023-03-04 ENCOUNTER — Ambulatory Visit: Payer: Medicare PPO

## 2023-03-05 ENCOUNTER — Ambulatory Visit: Payer: Medicare PPO

## 2023-03-08 ENCOUNTER — Ambulatory Visit: Payer: Medicare PPO | Admitting: Internal Medicine

## 2023-03-08 ENCOUNTER — Encounter: Payer: Self-pay | Admitting: Internal Medicine

## 2023-03-08 ENCOUNTER — Ambulatory Visit: Payer: Medicare PPO

## 2023-03-08 VITALS — BP 148/66 | HR 63 | Temp 97.8°F | Ht 68.0 in | Wt 191.0 lb

## 2023-03-08 DIAGNOSIS — J8489 Other specified interstitial pulmonary diseases: Secondary | ICD-10-CM

## 2023-03-08 DIAGNOSIS — Z7185 Encounter for immunization safety counseling: Secondary | ICD-10-CM | POA: Diagnosis not present

## 2023-03-08 DIAGNOSIS — Z8739 Personal history of other diseases of the musculoskeletal system and connective tissue: Secondary | ICD-10-CM | POA: Diagnosis not present

## 2023-03-08 DIAGNOSIS — G4733 Obstructive sleep apnea (adult) (pediatric): Secondary | ICD-10-CM | POA: Diagnosis not present

## 2023-03-08 DIAGNOSIS — M359 Systemic involvement of connective tissue, unspecified: Secondary | ICD-10-CM | POA: Diagnosis not present

## 2023-03-08 DIAGNOSIS — J849 Interstitial pulmonary disease, unspecified: Secondary | ICD-10-CM

## 2023-03-08 LAB — PULMONARY FUNCTION TEST
DL/VA % pred: 75 %
DL/VA: 2.94 ml/min/mmHg/L
DLCO cor % pred: 64 %
DLCO cor: 14.62 ml/min/mmHg
DLCO unc % pred: 64 %
DLCO unc: 14.62 ml/min/mmHg
FEF 25-75 Pre: 2.6 L/s
FEF2575-%Pred-Pre: 149 %
FEV1-%Pred-Pre: 107 %
FEV1-Pre: 2.78 L
FEV1FVC-%Pred-Pre: 111 %
FEV6-%Pred-Pre: 102 %
FEV6-Pre: 3.46 L
FEV6FVC-%Pred-Pre: 106 %
FVC-%Pred-Pre: 95 %
FVC-Pre: 3.5 L
Pre FEV1/FVC ratio: 79 %
Pre FEV6/FVC Ratio: 99 %

## 2023-03-08 NOTE — Progress Notes (Signed)
 OV 07/17/2020  Subjective:  Patient ID: Nicholas Webb, male , DOB: 1940/05/12 , age 83 y.o. , MRN: [id number] , ADDRESS: [address] [place] [address] PCP Douglass Ivanoff, MD Patient Care Team: Douglass Ivanoff, MD as PCP - General (Family Medicine) Jeffrie Oneil BROCKS, MD as PCP - Cardiology (Cardiology) Porter Andrez SAUNDERS, PA-C as Physician Assistant (Dermatology) Livingston Rigg, MD as Consulting Physician (Dermatology)  This Provider for this visit: Treatment Team:  Attending Provider: Geronimo Amel, MD    07/17/2020 -   Chief Complaint  Patient presents with   Follow-up    PFT performed 06/17/20. Pt states he is about the same since last visit.   Rheumatoid arthritis with interstitial lung disease History of pulmonary embolism on Eliquis  - since April 2020 History of coronary artery disease follows Dr. Jeffrie  HPI Nicholas Webb 83 y.o. -presents for follow-up.  He is accompanied by his daughter.  This visit is to focus on discussing the results.  His symptoms are extremely mild from a respiratory standpoint documented below.  His high-resolution CT chest shows interstitial lung disease probable UIP pattern.  He also has multiple small pulmonary nodules.  There is coronary artery calcification [he is known to have coronary artery disease and follows Dr. Adela.  His pulmonary function test shows very mild restriction from his ILD.  His walking desaturation test is essentially close to normal except for tachycardia.  Taken together the burden of ILD is mild.  His daughter wants to know how to approach his ILD.  They report that he was on methotrexate for many years [greater than 10].  There was wheezing and that is why he was referred to us .  He is currently off methotrexate.  He started working out at [employer] well     HRCT Chest data march 2022 CLINICAL DATA:  Interstitial lung disease, history of rheumatoid arthritis   EXAM: CT CHEST WITHOUT CONTRAST    TECHNIQUE: Multidetector CT imaging of the chest was performed following the standard protocol without intravenous contrast. High resolution imaging of the lungs, as well as inspiratory and expiratory imaging, was performed.   COMPARISON:  None.   FINDINGS: Cardiovascular: Aortic atherosclerosis. Normal heart size. Left coronary artery calcifications and stents. No pericardial effusion.   Mediastinum/Nodes: No enlarged mediastinal, hilar, or axillary lymph nodes. Thyroid  gland, trachea, and esophagus demonstrate no significant findings.   Lungs/Pleura: There is moderate pulmonary fibrosis in a pattern with apical to basal gradient featuring irregular peripheral interstitial opacity, septal thickening, mild traction bronchiectasis, areas of subpleural bronchiolectasis without clear evidence of honeycombing. No significant air trapping on expiratory phase imaging. There multiple small pulmonary nodules in the bilateral lung apices, generally irregular in morphology and measuring up to 5 mm, for example in the left apex (series 10, image 58). No pleural effusion or pneumothorax.   Upper Abdomen: No acute abnormality.   Musculoskeletal: No chest wall mass or suspicious bone lesions identified.  IMPRESSION: 1. There is moderate pulmonary fibrosis in a pattern with apical to basal gradient featuring irregular peripheral interstitial opacity, septal thickening, mild traction bronchiectasis, and areas of subpleural bronchiolectasis without clear evidence of honeycombing. Findings are consistent with a probable UIP pattern and generally in keeping with rheumatoid associated interstitial lung disease. Findings are categorized as probable UIP per consensus guidelines: Diagnosis of Idiopathic Pulmonary Fibrosis: An Official ATS/ERS/JRS/ALAT Clinical Practice Guideline. Am JINNY Honey Crit Care Med Vol 198, Iss 5, [phone], Oct 31 2016. 2. There multiple small  nonspecific pulmonary  nodules in the bilateral lung apices, generally irregular in morphology and measuring 5 mm and smaller. No follow-up needed if patient is low-risk (and has no known or suspected primary neoplasm). Non-contrast chest CT can be considered in 12 months if patient is high-risk. This recommendation follows the consensus statement: Guidelines for Management of Incidental Pulmonary Nodules Detected on CT Images: From the Fleischner Society 2017; Radiology 2017; 284:228-243. 3. Coronary artery disease.   Aortic Atherosclerosis (ICD10-I70.0).     Electronically Signed   By: Marolyn Jaksch M.D.   On: 05/22/2020 11:58      OV 01/16/2021  Subjective:  Patient ID: Nicholas Webb, male , DOB: 1941-02-08 , age 58 y.o. , MRN: [id number] , ADDRESS: [address] [place] [address] PCP Douglass Ivanoff, MD (Inactive) Patient Care Team: Douglass Ivanoff, MD (Inactive) as PCP - General (Family Medicine) Jeffrie Oneil BROCKS, MD as PCP - Cardiology (Cardiology) Porter Andrez JONELLE DEVONNA as Physician Assistant (Dermatology) Livingston Rigg, MD as Consulting Physician (Dermatology)  This Provider for this visit: Treatment Team:  Attending Provider: Geronimo Amel, MD   Rheumatoid arthritis with interstitial lung disease  -Started on Imuran by rheumatology between May 2022 and November 2022  -Last HRCT March 2022 History of pulmonary embolism on Eliquis  - since April 2020 History of coronary artery disease follows Dr. Jeffrie Pulmonary nodules associated with RA ILD  01/16/2021 -   Chief Complaint  Patient presents with   Follow-up    No new concerns, pft done today     HPI Nicholas Webb 83 y.o. -presents for routine follow-up.  He feels stable.  His dyspnea score is all altered.  It is documented below.  His walking desaturation test simple is also unchanged.  He feels great.  In the interim his rheumatologist started him on Imuran for his rheumatoid arthritis pain.  He feels good overall.   However his pulmonary function test shows 3-4% decline in FVC and a 16% decline in DLCO.  He is not feeling this.  We discussed about adding antifibrotic to his Imuran with the understanding that the Imuran itself might be having a antifibrotic effect.  He does not want to start this right now.     CT Chest data  No results found.  OV 03/27/2021  Subjective:  Patient ID: Nicholas Webb, male , DOB: 26-Jan-1941 , age 66 y.o. , MRN: [id number] , ADDRESS: [address] [place] [address] PCP Douglass Ivanoff, MD Patient Care Team: Douglass Ivanoff, MD as PCP - General (Family Medicine) Jeffrie Oneil BROCKS, MD as PCP - Cardiology (Cardiology) Porter Andrez JONELLE, PA-C as Physician Assistant (Dermatology) Livingston Rigg, MD as Consulting Physician (Dermatology)  This Provider for this visit: Treatment Team:  Attending Provider: Geronimo Amel, MD    03/27/2021 -   Chief Complaint  Patient presents with   Follow-up    10 week follow up with pft. Pt states that breathing is the same     HPI DAMIEON ARMENDARIZ 83 y.o. -returns for follow-up.  He presents with his daughter Leita Elbe who is nurse practitioner within the [facility] health system.  Since his last visit he stoppe exercises at [facility].  His daughter tried to encourage him in my presence.  I also encouraged him.  However he is not that motivated.  He continues on Imuran.  He feels his breathing is the same.  His symptom score shows possibly symptoms are slightly worse but he feels the same.  We did  pulmonary function test today and compared to November 2022 it is stable but compared to spring of last year the DLCO has declined.  This decline was apparent between November 2022 in April 2022.  Since starting Imuran lung function is stable.  Overall will deem this is stability.  He is interested in participating in research protocol.  In terms of lung nodules are very small.  Very likely due to rheumatoid.  Last CT scan was in March 2022.  1 year  CT scan will be the spring 2023 or summer 2023.       OV 10/13/2021  Subjective:  Patient ID: Nicholas Webb, male , DOB: 04-Mar-1940 , age 21 y.o. , MRN: [id number] , ADDRESS: [address] [place] [facility] [address] PCP Dyane Anthony RAMAN, FNP Patient Care Team: Dyane Anthony RAMAN, FNP as PCP - General (Family Medicine) Jeffrie Oneil BROCKS, MD as PCP - Cardiology (Cardiology) Porter Andrez SAUNDERS, PA-C as Physician Assistant (Dermatology) Livingston Rigg, MD as Consulting Physician (Dermatology)  This Provider for this visit: Treatment Team:  Attending Provider: Geronimo Amel, MD    10/13/2021 -   Chief Complaint  Patient presents with   Follow-up    PFT performed today.  Pt states he has been doing okay since last visit and denies any complaints.       HPI JAYLENE SCHROM 83 y.o. -returns for follow-up.  He is done his ILD-Pro registry visit today.  He feels stable.  Symptom scores are mild.  Walking desaturation test is stable.  He had pulmonary function test and it is stable compared to November 2022.  November 2022 is when he started his Imuran.  Since then his pulmonary function test is stable although prior to that his pulmonary function test and his CT scan done this summer compared to pre-Imuran show progression.  Overall he is stable.  Currently is not interested in antifibrotic's.      OV 03/31/2022  Subjective:  Patient ID: Nicholas Webb, male , DOB: 1940-09-16 , age 40 y.o. , MRN: [id number] , ADDRESS: [address] [place] [facility] [address] PCP Dyane Anthony RAMAN, FNP Patient Care Team: Dyane Anthony RAMAN, FNP as PCP - General (Family Medicine) Jeffrie Oneil BROCKS, MD as PCP - Cardiology (Cardiology) Porter Andrez SAUNDERS, PA-C as Physician Assistant (Dermatology) Livingston Rigg, MD (Inactive) as Consulting Physician (Dermatology)  This Provider for this visit: Treatment Team:  Attending Provider: Geronimo Amel, MD    03/31/2022 -   Chief Complaint  Patient  presents with   Follow-up    PFT done today. Had resp infection in early dec 2023- took augmentin, now only occ cough with white sputum. His breathing is unchanged maybe a little better.      HPI HASHIR DELEEUW 83 y.o. -returns for routine follow-up.  Last visit August 2023.  He is doing well.  He feels he is stable.  He continues on Imuran and prednisone through rheumatology.  His symptom score is stable pulmonary function test is stable.  He is up-to-date with the rest respiratory vaccines.  I personally reviewed the pulmonary function test from today.  He also participated in ILD-Pro registry visit today as a follow-up.  His walking desaturation test is stable.      OV 09/24/2022  Subjective:  Patient ID: Nicholas Webb, male , DOB: September 04, 1940 , age 63 y.o. , MRN: [id number] , ADDRESS: [address] [place] [facility] [address] PCP Dyane Anthony RAMAN, FNP Patient Care Team: Dyane Anthony RAMAN, FNP as  PCP - General (Family Medicine) Jeffrie Oneil BROCKS, MD as PCP - Cardiology (Cardiology) Porter Andrez SAUNDERS, PA-C as Physician Assistant (Dermatology) Livingston Rigg, MD (Inactive) as Consulting Physician (Dermatology)  This Provider for this visit: Treatment Team:  Attending Provider: Geronimo Amel, MD   09/24/2022 -   Chief Complaint  Patient presents with   Follow-up    Annual f/up, no complaints     HPI Nicholas Webb 83 y.o. -returns for follow-up.  Since his last visit he tells me that his arthritis is worse than 3 months ago Dr. Leni his rheumatologist put him on prednisone 5 mg/day and it is helping.  He now has wrist splints.  Also a skin cancer not otherwise specified in his left face.  Therefore he has not shaved and is supporting a beard.  He states he will soon shave himself.  Otherwise from a respiratory standpoint he is feeling stable.  There are no new complaints.  Pulmonary function test not done today because schedule issues.  But he feels same symptoms were the  same.  We did a sit/stand exercise hypoxemia test and it shows stability.  There are no other new issues.  We did vaccine counseling.      OV 03/08/2023  Subjective:  Patient ID: Nicholas Webb, male , DOB: 21-Aug-1940 , age 56 y.o. , MRN: [id number] , ADDRESS: [address] [place] [facility] [address] PCP Dyane Anthony RAMAN, FNP Patient Care Team: Dyane Anthony RAMAN, FNP as PCP - General (Family Medicine) Jeffrie Oneil BROCKS, MD as PCP - Cardiology (Cardiology) Porter Andrez SAUNDERS DEVONNA as Physician Assistant (Dermatology) Livingston Rigg, MD (Inactive) as Consulting Physician (Dermatology)  This Provider for this visit: Treatment Team:  Attending Provider: Geronimo Amel, MD   Rheumatoid arthritis with interstitial lung disease  -Rd - Started on Imuran by rheumatology between May 2022 and November 2022 -Ongoing methotrexate as of January 2025-?  Start date -Prednisone low-dose since May 2024.  -Last HRCT March 2022 -> June 2023 with progression +  - PFT progressed April 2022 - Nov 2022 -> immuran Rx -> stable Aug 2023 - Jan 2024  - ILD PRO participant  History of pulmonary embolism on Eliquis  - since April 2020 History of coronary artery disease follows Dr. Jeffrie  -s/p stent April 2020 Pulmonary nodules associated with RA ILD - 5mm UL in march 2022 and no change Jun 2023  03/08/2023 -   Chief Complaint  Patient presents with   Follow-up    PFT done today. Breathing has been stable. No new co's.      HPI MAOR MECKEL 83 y.o. -presents for follow-up.  He remains on low-dose prednisone, methotrexate and also Imuran for his rheumatoid arthritis.  He says he was started on prednisone because of joint symptoms.  This happened approximately a year ago.  Since starting prednisone he states he is gained weight.  Otherwise he feels stable.  He does not feel he is any more short of breath.  Symptom scores are stable.  Walking desaturation test is also stable but is dropped 4 points.   However his pulmonary function test I am worried is beginning to show decline particularly with the DLCO although the DLCO itself is prone to a lot of variation.  I did share this with him and in the context of CT scan a year ago showing progression I did tell him that we need to keep a closer eye.  He is willing to do this.  I will see  him back in a few months with both spirometry and DLCO and CT scan.  He is immunosuppressed with multiple agents.  I recommended he talk to his primary care rheumatologist about Bactrim.    SYMPTOM SCALE - ILD 07/17/2020  03/27/2021   10/13/2021  03/31/2022 immuran 09/24/2022 immuran 03/08/2023   O2 use ra ra ra ra ra   Shortness of Breath 0 -> 5 scale with 5 being worst (score 6 If unable to do)       At rest 0 0 1 0 1   Simple tasks - showers, clothes change, eating, shaving 0 0 1 2 1    Household (dishes, doing bed, laundry) 1 2 0 1 1   Shopping 0 0 0 1 1   Walking level at own pace 0 2 0 1 1   Walking up Stairs 1 2 1 1 2    Total (30-36) Dyspnea Score 2 6 3 6 7    How bad is your cough? minimal 1 0 3 1   How bad is your fatigue 0 0 1 2 1    How bad is nausea 0 0 0 0 1   How bad is vomiting?  0 0 0 0 1   How bad is diarrhea? 0 0 00 1 1   How bad is anxiety? 00 2 0 1 1   How bad is depression 0 0 0 1 1        Simple office walk x  3 laps goal with forehead probe 07/17/2020  01/16/2021  03/27/2021  10/13/2021  03/31/2022  09/24/2022  03/08/2023   O2 used ra ra Ra ra3 ra ra ra  Number laps completed 3 3 3 3 3  Sit stand x 15 Sit stand x 15  Comments about pace avg avg       Resting Pulse Ox/HR 98% and 74/min 97% and 71/min 99% and 67 98% and HR 64 99% and HR 70 98% an dHR 73 96% and HR HR 73  Final Pulse Ox/HR 96% and 108/min 96% and HR 106 95% and 106 96% and HR 99 95% and HR 116 96% and HR 92 92% and HR 85  Desaturated </= 88% no no no no  no no  Desaturated <= 3% points no no yes no Yes 4 pints no no  Got Tachycardic >/= 90/min yes yes yes yes Yes yes  Yes 4 ponts  Symptoms at end of test none none none No complaints  4 of 10 dyapnea No dyspnea  Miscellaneous comments x No change x x  stable       PFT     Latest Ref Rng & Units 03/08/2023    1:57 PM 03/31/2022    9:22 AM 10/13/2021    2:50 PM 03/27/2021    9:38 AM 01/16/2021    9:36 AM 06/17/2020    9:31 AM  ILD indicators  FVC-Pre L 3.50  P 3.56  3.64  3.61  3.56  3.71   FVC-Predicted Pre % 95  P 96  98  96  95  98   FVC-Post L      3.75   FVC-Predicted Post %      99   TLC L      5.30   TLC Predicted %      79   DLCO uncorrected ml/min/mmHg 14.62  P 15.73  15.96  15.93  15.03  18.32   DLCO UNC %Pred % 64  P 68  69  69  65  78   DLCO Corrected ml/min/mmHg 14.62  P 15.73  15.96  15.93  15.03  18.32   DLCO COR %Pred % 64  P 68  69  69  65  78     P Preliminary result      LAB RESULTS last 96 hours No results found.  LAB RESULTS last 90 days Recent Results (from the past 2160 hours)  Pulmonary function test     Status: None (Preliminary result)   Collection Time: 03/08/23  1:57 PM  Result Value Ref Range   FVC-Pre 3.50 L   FVC-%Pred-Pre 95 %   FEV1-Pre 2.78 L   FEV1-%Pred-Pre 107 %   FEV6-Pre 3.46 L   FEV6-%Pred-Pre 102 %   Pre FEV1/FVC ratio 79 %   FEV1FVC-%Pred-Pre 111 %   Pre FEV6/FVC Ratio 99 %   FEV6FVC-%Pred-Pre 106 %   FEF 25-75 Pre 2.60 L/sec   FEF2575-%Pred-Pre 149 %   DLCO unc 14.62 ml/min/mmHg   DLCO unc % pred 64 %   DLCO cor 14.62 ml/min/mmHg   DLCO cor % pred 64 %   DL/VA 7.05 ml/min/mmHg/L   DL/VA % pred 75 %         has a past medical history of Anemia of chronic disease, Atherosclerosis of both carotid arteries, Atypical mole (03/28/2013), Barrett's esophagus, Basal cell carcinoma (12/17/2015), Dizziness, GERD (gastroesophageal reflux disease), adenomatous colonic polyps, Hyperlipidemia, Macrocytosis without anemia, Obesity, OSA (obstructive sleep apnea), Rheumatoid arthritis(714.0), Squamous cell carcinoma of skin (02/25/2010), Squamous  cell carcinoma of skin (02/25/2010), Squamous cell carcinoma of skin (03/24/2011), Squamous cell carcinoma of skin (03/24/2011), Squamous cell carcinoma of skin (03/24/2011), Squamous cell carcinoma of skin (05/05/2011), Squamous cell carcinoma of skin (11/28/2013), Squamous cell carcinoma of skin (12/17/2015), Squamous cell carcinoma of skin (12/17/2015), Squamous cell carcinoma of skin (01/03/2016), Squamous cell carcinoma of skin (04/22/2016), Squamous cell carcinoma of skin (10/06/2016), Squamous cell carcinoma of skin (12/22/2016), Squamous cell carcinoma of skin (01/11/2018), Squamous cell carcinoma of skin (01/11/2018), Squamous cell carcinoma of skin (01/11/2018), Squamous cell carcinoma of skin (01/11/2018), Squamous cell carcinoma of skin (03/07/2019), Squamous cell carcinoma of skin (03/07/2019), Squamous cell carcinoma of skin (03/07/2019), Squamous cell carcinoma of skin (01/04/2020), Squamous cell carcinoma of skin (01/04/2020), Squamous cell carcinoma of skin (01/04/2020), and Syncope.   reports that he has never smoked. He has never used smokeless tobacco.  Past Surgical History:  Procedure Laterality Date   CORONARY STENT INTERVENTION N/A 06/24/2018   Procedure: CORONARY STENT INTERVENTION;  Surgeon: Verlin Lonni BIRCH, MD;  Location: [facility];  Service: Cardiovascular;  Laterality: N/A;   IR IVC FILTER PLMT / S&I /IMG GUID/MOD SED  10/07/2018   IR IVC FILTER RETRIEVAL / S&I /IMG GUID/MOD SED  06/15/2019   IR RADIOLOGIST EVAL & MGMT  09/27/2018   IR RADIOLOGIST EVAL & MGMT  01/04/2019   JOINT REPLACEMENT  2004   left   LEFT HEART CATH AND CORONARY ANGIOGRAPHY N/A 06/24/2018   Procedure: LEFT HEART CATH AND CORONARY ANGIOGRAPHY;  Surgeon: Verlin Lonni BIRCH, MD;  Location: [facility]AS[facility];  Service: Cardiovascular;  Laterality: N/A;   SKIN CANCER EXCISION Left 11/05/2022   Left Cheek    No Known Allergies  Immunization History  Administered Date(s)  Administered   Influenza Split 11/01/2022   Influenza, High Dose Seasonal PF 12/18/2013, 12/18/2014, 12/13/2017   Influenza, Quadrivalent, Recombinant, Inj, Pf 12/14/2016, 12/07/2018   Influenza-Unspecified 12/01/2012, 11/30/2020, 11/26/2021   PFIZER(Purple Top)SARS-COV-2 Vaccination 03/23/2019, 04/13/2019, 11/30/2020,  11/26/2021   PNEUMOCOCCAL CONJUGATE-20 07/25/2021   Pfizer Covid-19 Vaccine Bivalent Booster 62yrs & up 11/01/2022   Tdap 06/23/2018   Zoster Recombinant(Shingrix) 05/08/2017, 07/09/2017    Family History  Problem Relation Age of Onset   Stroke Mother    Glaucoma Mother    Cancer Father    Prostate cancer Father    Colon cancer Father      Current Outpatient Medications:    atorvastatin  (LIPITOR ) 40 MG tablet, Take 1 tablet (40 mg total) by mouth daily. Pt needs to keep upcoming appt in Nov for further refills, Disp: 90 tablet, Rfl: 1   azaTHIOprine (IMURAN) 50 MG tablet, Take 50 mg by mouth 2 (two) times daily., Disp: , Rfl:    denosumab  (PROLIA ) 60 MG/ML SOSY injection, Inject 60 mg into the skin every 6 (six) months., Disp: , Rfl:    ELIQUIS  5 MG TABS tablet, TAKE 1 TABLET(5 MG) BY MOUTH TWICE DAILY, Disp: 60 tablet, Rfl: 11   esomeprazole (NEXIUM) 20 MG capsule, Take 20 mg by mouth daily at 12 noon. , Disp: , Rfl:    folic acid  (FOLVITE ) 1 MG tablet, Take 1 mg by mouth daily. 1-2 daily, Disp: , Rfl:    methotrexate (RHEUMATREX) 2.5 MG tablet, Take 10 mg by mouth once a week., Disp: , Rfl:    Multiple Vitamins-Minerals (MULTIVITAMIN WITH MINERALS) tablet, Take 1 tablet by mouth daily., Disp: , Rfl:    Niacinamide-Zn-Cu-Methfo-Se-Cr (NICOTINAMIDE PO), Take 500 mg by mouth., Disp: , Rfl:    predniSONE (DELTASONE) 5 MG tablet, Take 5 mg by mouth daily with breakfast. Dr Leni, Disp: , Rfl:    PREVIDENT 5000 PLUS 1.1 % CREA dental cream, Place 1 Application onto teeth as directed., Disp: , Rfl:       Objective:   Vitals:   03/08/23 1541 03/08/23 1542  BP: (!)  148/66   Pulse: 63   Temp:  97.8 F (36.6 C)  TempSrc:  Oral  SpO2: 92%   Weight:  191 lb (86.6 kg)  Height:  5' 8 (1.727 m)    Estimated body mass index is 29.04 kg/m as calculated from the following:   Height as of this encounter: 5' 8 (1.727 m).   Weight as of this encounter: 191 lb (86.6 kg).  @WEIGHTCHANGE @  Filed Weights   03/08/23 1542  Weight: 191 lb (86.6 kg)     Physical Exam   General: No distress. Looks well O2 at rest: no Cane present: no Sitting in wheel chair: no Frail: no Obese: no Neuro: Alert and Oriented x 3. GCS 15. Speech normal Psych: Pleasant Resp:  Barrel Chest - no.  Wheeze - no, Crackles - YES BASE, No overt respiratory distress CVS: Normal heart sounds. Murmurs - no Ext: Stigmata of Connective Tissue Disease - no HEENT: Normal upper airway. PEERL +. No post nasal drip        Assessment:       ICD-10-CM   1. Interstitial lung disease due to connective tissue disease (HCC)  J84.89 Pulmonary function test   M35.9 CT Chest High Resolution    2. History of rheumatoid arthritis  Z87.39 Pulmonary function test    CT Chest High Resolution    3. Vaccine counseling  Z71.85 Pulmonary function test    CT Chest High Resolution         Plan:     Patient Instructions     ICD-10-CM   1. ILD (interstitial lung disease) (HCC)  J84.9  2. History of rheumatoid arthritis  Z87.39     3. 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   - is currently mild based on breathing test and walk test and symptoms  - Though you feel stable, there appears to be mild progerssion d CT Mrch 2022-> June 2023 and likely on PFT/DLCO Jan 2024 -> Jan 2025   - I think immuran is helping control the ILD  - Now on prednsione since May 2024 via rheumatology  - Also on ongong methotrexate    Plan -continue immuran, methtorexate and prednisone via Dr Leni - Talk to Dr Leni about starting bactrim - see you  sooner to monitor progression esp with potential change in PFT  - do HRCT in 3 month   - do PFT in 3 months  - based on results, start anti-fibrotic ofev/esbriet +/- consider clnical trial -  ILD PRO registry visit followup visit 09/24/2022 (already done) -Do spirometry and DLCO in 6 months- - get RSV vaccine   Follow-up - Return in 3 months -but after breathing test and PFT  -Symptom score and walking desaturation test at follow-up   FOLLOWUP Return in about 3 months (around 06/06/2023) for 30 min visit, after HRCT chest, after Spiro and DLCO, Face to Face Visit, ILD, with Dr Geronimo.    SIGNATURE    Dr. Dorethia Geronimo, M.D., F.C.C.P,  Pulmonary and Critical Care Medicine Staff Physician, [facility] Center Director - Interstitial Lung Disease  Program  Pulmonary Fibrosis [facility] - [facility] [place], [place], [address]  Pager: [phone], If no answer or between  15:00h - 7:00h: call [phone] Telephone: [phone]  5:56 PM 03/08/2023

## 2023-03-08 NOTE — Patient Instructions (Addendum)
 ICD-10-CM   1. ILD (interstitial lung disease) (HCC)  J84.9     2. History of rheumatoid arthritis  Z87.39     3. 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   - is currently mild based on breathing test and walk test and symptoms  - Though you feel stable, there appears to be mild progerssion d CT Mrch 2022-> June 2023 and likely on PFT/DLCO Jan 2024 -> Jan 2025   - I think immuran is helping control the ILD  - Now on prednsione since May 2024 via rheumatology  - Also on ongong methotrexate    Plan -continue immuran, methtorexate and prednisone via Dr Leni - Talk to Dr Leni about starting bactrim - see you sooner to monitor progression esp with potential change in PFT  - do HRCT in 3 month   - do PFT in 3 months  - based on results, start anti-fibrotic ofev/esbriet +/- consider clnical trial -  ILD PRO registry visit followup visit 09/24/2022 (already done) -Do spirometry and DLCO in 6 months- - get RSV vaccine   Follow-up - Return in 3 months -but after breathing test and PFT  -Symptom score and walking desaturation test at follow-up

## 2023-03-08 NOTE — Patient Instructions (Signed)
 Spirometry/diffusion capacity performed today.

## 2023-03-08 NOTE — Progress Notes (Signed)
 Spirometry/diffusion capacity performed today.

## 2023-03-09 ENCOUNTER — Ambulatory Visit: Payer: Medicare PPO | Admitting: Radiation Oncology

## 2023-03-09 ENCOUNTER — Ambulatory Visit: Payer: Medicare PPO

## 2023-03-09 DIAGNOSIS — S0100XA Unspecified open wound of scalp, initial encounter: Secondary | ICD-10-CM | POA: Diagnosis not present

## 2023-03-09 DIAGNOSIS — M180 Bilateral primary osteoarthritis of first carpometacarpal joints: Secondary | ICD-10-CM | POA: Diagnosis not present

## 2023-03-10 ENCOUNTER — Ambulatory Visit: Payer: Medicare PPO

## 2023-03-10 DIAGNOSIS — C44320 Squamous cell carcinoma of skin of unspecified parts of face: Secondary | ICD-10-CM | POA: Insufficient documentation

## 2023-03-11 ENCOUNTER — Ambulatory Visit: Payer: Medicare PPO

## 2023-03-12 ENCOUNTER — Ambulatory Visit: Payer: Medicare PPO

## 2023-03-15 ENCOUNTER — Ambulatory Visit: Payer: Medicare PPO

## 2023-03-16 ENCOUNTER — Ambulatory Visit: Payer: Medicare PPO

## 2023-03-17 ENCOUNTER — Ambulatory Visit: Payer: Medicare PPO

## 2023-03-18 ENCOUNTER — Ambulatory Visit: Payer: Medicare PPO

## 2023-03-19 ENCOUNTER — Ambulatory Visit: Payer: Medicare PPO

## 2023-03-22 ENCOUNTER — Ambulatory Visit
Admission: RE | Admit: 2023-03-22 | Discharge: 2023-03-22 | Disposition: A | Discharge status: Home / Self Care | Payer: Medicare PPO | Source: Ambulatory Visit | Attending: Radiation Oncology | Admitting: Radiation Oncology

## 2023-03-22 ENCOUNTER — Ambulatory Visit: Payer: Medicare PPO

## 2023-03-22 ENCOUNTER — Other Ambulatory Visit: Payer: Self-pay

## 2023-03-22 DIAGNOSIS — Z510 Encounter for antineoplastic radiation therapy: Secondary | ICD-10-CM | POA: Diagnosis not present

## 2023-03-22 DIAGNOSIS — C44320 Squamous cell carcinoma of skin of unspecified parts of face: Secondary | ICD-10-CM | POA: Diagnosis not present

## 2023-03-22 LAB — RAD ONC ARIA SESSION SUMMARY
Course Elapsed Days: 0
Plan Fractions Treated to Date: 1
Plan Prescribed Dose Per Fraction: 2.5 Gy
Plan Total Fractions Prescribed: 20
Plan Total Prescribed Dose: 50 Gy
Reference Point Dosage Given to Date: 2.5 Gy
Reference Point Session Dosage Given: 2.5 Gy
Session Number: 1

## 2023-03-23 ENCOUNTER — Other Ambulatory Visit: Payer: Self-pay

## 2023-03-23 ENCOUNTER — Ambulatory Visit
Admission: RE | Admit: 2023-03-23 | Discharge: 2023-03-23 | Disposition: A | Discharge status: Home / Self Care | Payer: Medicare PPO | Source: Ambulatory Visit | Attending: Radiation Oncology | Admitting: Radiation Oncology

## 2023-03-23 DIAGNOSIS — C44320 Squamous cell carcinoma of skin of unspecified parts of face: Secondary | ICD-10-CM | POA: Diagnosis not present

## 2023-03-23 LAB — RAD ONC ARIA SESSION SUMMARY
Course Elapsed Days: 1
Plan Fractions Treated to Date: 2
Plan Prescribed Dose Per Fraction: 2.5 Gy
Plan Total Fractions Prescribed: 20
Plan Total Prescribed Dose: 50 Gy
Reference Point Dosage Given to Date: 5 Gy
Reference Point Session Dosage Given: 2.5 Gy
Session Number: 2

## 2023-03-24 ENCOUNTER — Other Ambulatory Visit: Payer: Self-pay

## 2023-03-24 ENCOUNTER — Ambulatory Visit
Admission: RE | Admit: 2023-03-24 | Discharge: 2023-03-24 | Disposition: A | Discharge status: Home / Self Care | Payer: Medicare PPO | Source: Ambulatory Visit | Attending: Radiation Oncology

## 2023-03-24 DIAGNOSIS — C44320 Squamous cell carcinoma of skin of unspecified parts of face: Secondary | ICD-10-CM | POA: Diagnosis not present

## 2023-03-24 LAB — RAD ONC ARIA SESSION SUMMARY
Course Elapsed Days: 2
Plan Fractions Treated to Date: 3
Plan Prescribed Dose Per Fraction: 2.5 Gy
Plan Total Fractions Prescribed: 20
Plan Total Prescribed Dose: 50 Gy
Reference Point Dosage Given to Date: 7.5 Gy
Reference Point Session Dosage Given: 2.5 Gy
Session Number: 3

## 2023-03-25 ENCOUNTER — Ambulatory Visit
Admission: RE | Admit: 2023-03-25 | Discharge: 2023-03-25 | Disposition: A | Discharge status: Home / Self Care | Payer: Medicare PPO | Source: Ambulatory Visit | Attending: Radiation Oncology | Admitting: Radiation Oncology

## 2023-03-25 ENCOUNTER — Other Ambulatory Visit: Payer: Self-pay

## 2023-03-25 DIAGNOSIS — C44320 Squamous cell carcinoma of skin of unspecified parts of face: Secondary | ICD-10-CM | POA: Diagnosis not present

## 2023-03-25 LAB — RAD ONC ARIA SESSION SUMMARY
Course Elapsed Days: 3
Plan Fractions Treated to Date: 4
Plan Prescribed Dose Per Fraction: 2.5 Gy
Plan Total Fractions Prescribed: 20
Plan Total Prescribed Dose: 50 Gy
Reference Point Dosage Given to Date: 10 Gy
Reference Point Session Dosage Given: 2.5 Gy
Session Number: 4

## 2023-03-26 ENCOUNTER — Ambulatory Visit: Payer: Medicare PPO

## 2023-03-29 ENCOUNTER — Ambulatory Visit: Payer: Medicare PPO

## 2023-03-29 ENCOUNTER — Other Ambulatory Visit: Payer: Self-pay

## 2023-03-29 ENCOUNTER — Ambulatory Visit
Admission: RE | Admit: 2023-03-29 | Discharge: 2023-03-29 | Disposition: A | Discharge status: Home / Self Care | Payer: Medicare PPO | Source: Ambulatory Visit | Attending: Radiation Oncology

## 2023-03-29 DIAGNOSIS — C44320 Squamous cell carcinoma of skin of unspecified parts of face: Secondary | ICD-10-CM | POA: Diagnosis not present

## 2023-03-29 LAB — RAD ONC ARIA SESSION SUMMARY
Course Elapsed Days: 7
Plan Fractions Treated to Date: 5
Plan Prescribed Dose Per Fraction: 2.5 Gy
Plan Total Fractions Prescribed: 20
Plan Total Prescribed Dose: 50 Gy
Reference Point Dosage Given to Date: 12.5 Gy
Reference Point Session Dosage Given: 2.5 Gy
Session Number: 5

## 2023-03-30 ENCOUNTER — Ambulatory Visit
Admission: RE | Admit: 2023-03-30 | Discharge: 2023-03-30 | Disposition: A | Discharge status: Home / Self Care | Payer: Medicare PPO | Source: Ambulatory Visit | Attending: Radiation Oncology | Admitting: Radiation Oncology

## 2023-03-30 ENCOUNTER — Ambulatory Visit
Admission: RE | Admit: 2023-03-30 | Discharge: 2023-03-30 | Disposition: A | Discharge status: Home / Self Care | Payer: Medicare PPO | Source: Ambulatory Visit | Attending: Radiation Oncology

## 2023-03-30 ENCOUNTER — Other Ambulatory Visit: Payer: Self-pay

## 2023-03-30 DIAGNOSIS — Z510 Encounter for antineoplastic radiation therapy: Secondary | ICD-10-CM | POA: Diagnosis not present

## 2023-03-30 DIAGNOSIS — C44320 Squamous cell carcinoma of skin of unspecified parts of face: Secondary | ICD-10-CM

## 2023-03-30 LAB — RAD ONC ARIA SESSION SUMMARY
Course Elapsed Days: 8
Plan Fractions Treated to Date: 6
Plan Prescribed Dose Per Fraction: 2.5 Gy
Plan Total Fractions Prescribed: 20
Plan Total Prescribed Dose: 50 Gy
Reference Point Dosage Given to Date: 15 Gy
Reference Point Session Dosage Given: 2.5 Gy
Session Number: 6

## 2023-03-30 MED ORDER — SONAFINE EX EMUL
1.0000 | Freq: Once | CUTANEOUS | Status: AC
  Administered 2023-03-30: 1 via TOPICAL

## 2023-03-31 ENCOUNTER — Ambulatory Visit: Payer: Medicare PPO

## 2023-03-31 ENCOUNTER — Ambulatory Visit
Admission: RE | Admit: 2023-03-31 | Discharge: 2023-03-31 | Disposition: A | Discharge status: Home / Self Care | Payer: Medicare PPO | Source: Ambulatory Visit | Attending: Radiation Oncology

## 2023-03-31 ENCOUNTER — Other Ambulatory Visit: Payer: Self-pay

## 2023-03-31 DIAGNOSIS — C44320 Squamous cell carcinoma of skin of unspecified parts of face: Secondary | ICD-10-CM | POA: Diagnosis not present

## 2023-03-31 LAB — RAD ONC ARIA SESSION SUMMARY
Course Elapsed Days: 9
Plan Fractions Treated to Date: 7
Plan Prescribed Dose Per Fraction: 2.5 Gy
Plan Total Fractions Prescribed: 20
Plan Total Prescribed Dose: 50 Gy
Reference Point Dosage Given to Date: 17.5 Gy
Reference Point Session Dosage Given: 2.5 Gy
Session Number: 7

## 2023-04-01 ENCOUNTER — Ambulatory Visit
Admission: RE | Admit: 2023-04-01 | Discharge: 2023-04-01 | Disposition: A | Discharge status: Home / Self Care | Payer: Medicare PPO | Source: Ambulatory Visit | Attending: Radiation Oncology | Admitting: Radiation Oncology

## 2023-04-01 ENCOUNTER — Other Ambulatory Visit: Payer: Self-pay

## 2023-04-01 ENCOUNTER — Ambulatory Visit: Payer: Medicare PPO

## 2023-04-01 DIAGNOSIS — C44320 Squamous cell carcinoma of skin of unspecified parts of face: Secondary | ICD-10-CM | POA: Diagnosis not present

## 2023-04-01 LAB — RAD ONC ARIA SESSION SUMMARY
Course Elapsed Days: 10
Plan Fractions Treated to Date: 8
Plan Prescribed Dose Per Fraction: 2.5 Gy
Plan Total Fractions Prescribed: 20
Plan Total Prescribed Dose: 50 Gy
Reference Point Dosage Given to Date: 20 Gy
Reference Point Session Dosage Given: 2.5 Gy
Session Number: 8

## 2023-04-02 ENCOUNTER — Ambulatory Visit
Admission: RE | Admit: 2023-04-02 | Discharge: 2023-04-02 | Disposition: A | Discharge status: Home / Self Care | Payer: Medicare PPO | Source: Ambulatory Visit | Attending: Radiation Oncology | Admitting: Radiation Oncology

## 2023-04-02 ENCOUNTER — Other Ambulatory Visit: Payer: Self-pay

## 2023-04-02 DIAGNOSIS — C44320 Squamous cell carcinoma of skin of unspecified parts of face: Secondary | ICD-10-CM | POA: Diagnosis not present

## 2023-04-02 LAB — RAD ONC ARIA SESSION SUMMARY
Course Elapsed Days: 11
Plan Fractions Treated to Date: 9
Plan Prescribed Dose Per Fraction: 2.5 Gy
Plan Total Fractions Prescribed: 20
Plan Total Prescribed Dose: 50 Gy
Reference Point Dosage Given to Date: 22.5 Gy
Reference Point Session Dosage Given: 2.5 Gy
Session Number: 9

## 2023-04-05 ENCOUNTER — Ambulatory Visit: Payer: Medicare PPO | Admitting: Podiatry

## 2023-04-05 ENCOUNTER — Encounter: Payer: Self-pay | Admitting: Podiatry

## 2023-04-05 ENCOUNTER — Ambulatory Visit
Admission: RE | Admit: 2023-04-05 | Discharge: 2023-04-05 | Disposition: A | Discharge status: Home / Self Care | Payer: Medicare PPO | Source: Ambulatory Visit | Attending: Radiation Oncology | Admitting: Radiation Oncology

## 2023-04-05 ENCOUNTER — Other Ambulatory Visit: Payer: Self-pay

## 2023-04-05 ENCOUNTER — Ambulatory Visit: Payer: Medicare PPO

## 2023-04-05 VITALS — Ht 68.0 in | Wt 191.0 lb

## 2023-04-05 DIAGNOSIS — C44329 Squamous cell carcinoma of skin of other parts of face: Secondary | ICD-10-CM | POA: Insufficient documentation

## 2023-04-05 DIAGNOSIS — Z510 Encounter for antineoplastic radiation therapy: Secondary | ICD-10-CM | POA: Diagnosis not present

## 2023-04-05 DIAGNOSIS — D689 Coagulation defect, unspecified: Secondary | ICD-10-CM

## 2023-04-05 DIAGNOSIS — B351 Tinea unguium: Secondary | ICD-10-CM | POA: Diagnosis not present

## 2023-04-05 DIAGNOSIS — M79676 Pain in unspecified toe(s): Secondary | ICD-10-CM

## 2023-04-05 LAB — RAD ONC ARIA SESSION SUMMARY
Course Elapsed Days: 14
Plan Fractions Treated to Date: 10
Plan Prescribed Dose Per Fraction: 2.5 Gy
Plan Total Fractions Prescribed: 20
Plan Total Prescribed Dose: 50 Gy
Reference Point Dosage Given to Date: 25 Gy
Reference Point Session Dosage Given: 2.5 Gy
Session Number: 10

## 2023-04-05 NOTE — Progress Notes (Signed)
 This patient returns to my office for at risk foot care.  This patient requires this care by a professional since this patient will be at risk due to having coagulation defect.  Patient is taking eliquis.  This patient is unable to cut nails himself since the patient cannot reach his nails.These nails are painful walking and wearing shoes.  This patient presents for at risk foot care today.  General Appearance  Alert, conversant and in no acute stress.  Vascular  Dorsalis pedis and posterior tibial  pulses are palpable  bilaterally.  Capillary return is within normal limits  bilaterally. Temperature is within normal limits  bilaterally.  Neurologic  Senn-Weinstein monofilament wire test within normal limits  bilaterally. Muscle power within normal limits bilaterally.  Nails Thick disfigured discolored nails with subungual debris  from hallux to fifth toes bilaterally. No evidence of bacterial infection or drainage bilaterally.  Orthopedic  No limitations of motion  feet .  No crepitus or effusions noted.  No bony pathology or digital deformities noted.  DJD 1st MPJ right foot. Bony prominence medial aspect both rearfeet.  Pes planus right foot.  Skin  normotropic skin with no porokeratosis noted bilaterally.  No signs of infections or ulcers noted.     Onychomycosis  Pain in right toes  Pain in left toes  Consent was obtained for treatment procedures.   Mechanical debridement of nails 1-5  bilaterally performed with a nail nipper.  Filed with dremel without incident.    Return office visit    10 weeks                  Told patient to return for periodic foot care and evaluation due to potential at risk complications.   Helane Gunther DPM

## 2023-04-06 ENCOUNTER — Other Ambulatory Visit: Payer: Self-pay

## 2023-04-06 ENCOUNTER — Ambulatory Visit
Admission: RE | Admit: 2023-04-06 | Discharge: 2023-04-06 | Disposition: A | Discharge status: Home / Self Care | Payer: Medicare PPO | Source: Ambulatory Visit | Attending: Radiation Oncology | Admitting: Radiation Oncology

## 2023-04-06 DIAGNOSIS — Z510 Encounter for antineoplastic radiation therapy: Secondary | ICD-10-CM | POA: Diagnosis not present

## 2023-04-06 DIAGNOSIS — C44329 Squamous cell carcinoma of skin of other parts of face: Secondary | ICD-10-CM | POA: Diagnosis not present

## 2023-04-06 DIAGNOSIS — C44320 Squamous cell carcinoma of skin of unspecified parts of face: Secondary | ICD-10-CM | POA: Diagnosis not present

## 2023-04-06 LAB — RAD ONC ARIA SESSION SUMMARY
Course Elapsed Days: 15
Plan Fractions Treated to Date: 11
Plan Prescribed Dose Per Fraction: 2.5 Gy
Plan Total Fractions Prescribed: 20
Plan Total Prescribed Dose: 50 Gy
Reference Point Dosage Given to Date: 27.5 Gy
Reference Point Session Dosage Given: 2.5 Gy
Session Number: 11

## 2023-04-07 ENCOUNTER — Other Ambulatory Visit: Payer: Self-pay

## 2023-04-07 ENCOUNTER — Ambulatory Visit
Admission: RE | Admit: 2023-04-07 | Discharge: 2023-04-07 | Disposition: A | Discharge status: Home / Self Care | Payer: Medicare PPO | Source: Ambulatory Visit | Attending: Radiation Oncology

## 2023-04-07 DIAGNOSIS — C44329 Squamous cell carcinoma of skin of other parts of face: Secondary | ICD-10-CM | POA: Diagnosis not present

## 2023-04-07 DIAGNOSIS — Z510 Encounter for antineoplastic radiation therapy: Secondary | ICD-10-CM | POA: Diagnosis not present

## 2023-04-07 LAB — RAD ONC ARIA SESSION SUMMARY
Course Elapsed Days: 16
Plan Fractions Treated to Date: 12
Plan Prescribed Dose Per Fraction: 2.5 Gy
Plan Total Fractions Prescribed: 20
Plan Total Prescribed Dose: 50 Gy
Reference Point Dosage Given to Date: 30 Gy
Reference Point Session Dosage Given: 2.5 Gy
Session Number: 12

## 2023-04-08 ENCOUNTER — Ambulatory Visit
Admission: RE | Admit: 2023-04-08 | Discharge: 2023-04-08 | Disposition: A | Discharge status: Home / Self Care | Payer: Medicare PPO | Source: Ambulatory Visit | Attending: Radiation Oncology | Admitting: Radiation Oncology

## 2023-04-08 ENCOUNTER — Other Ambulatory Visit: Payer: Self-pay

## 2023-04-08 DIAGNOSIS — C44329 Squamous cell carcinoma of skin of other parts of face: Secondary | ICD-10-CM | POA: Diagnosis not present

## 2023-04-08 DIAGNOSIS — Z510 Encounter for antineoplastic radiation therapy: Secondary | ICD-10-CM | POA: Diagnosis not present

## 2023-04-08 LAB — RAD ONC ARIA SESSION SUMMARY
Course Elapsed Days: 17
Plan Fractions Treated to Date: 13
Plan Prescribed Dose Per Fraction: 2.5 Gy
Plan Total Fractions Prescribed: 20
Plan Total Prescribed Dose: 50 Gy
Reference Point Dosage Given to Date: 32.5 Gy
Reference Point Session Dosage Given: 2.5 Gy
Session Number: 13

## 2023-04-09 ENCOUNTER — Ambulatory Visit
Admission: RE | Admit: 2023-04-09 | Discharge: 2023-04-09 | Disposition: A | Discharge status: Home / Self Care | Payer: Medicare PPO | Source: Ambulatory Visit | Attending: Radiation Oncology | Admitting: Radiation Oncology

## 2023-04-09 ENCOUNTER — Ambulatory Visit
Admission: RE | Admit: 2023-04-09 | Discharge: 2023-04-09 | Disposition: A | Discharge status: Home / Self Care | Payer: Medicare PPO | Source: Ambulatory Visit | Attending: Radiation Oncology

## 2023-04-09 ENCOUNTER — Other Ambulatory Visit: Payer: Self-pay

## 2023-04-09 DIAGNOSIS — Z510 Encounter for antineoplastic radiation therapy: Secondary | ICD-10-CM | POA: Diagnosis not present

## 2023-04-09 DIAGNOSIS — C44329 Squamous cell carcinoma of skin of other parts of face: Secondary | ICD-10-CM | POA: Diagnosis not present

## 2023-04-09 LAB — RAD ONC ARIA SESSION SUMMARY
Course Elapsed Days: 18
Plan Fractions Treated to Date: 14
Plan Prescribed Dose Per Fraction: 2.5 Gy
Plan Total Fractions Prescribed: 20
Plan Total Prescribed Dose: 50 Gy
Reference Point Dosage Given to Date: 35 Gy
Reference Point Session Dosage Given: 2.5 Gy
Session Number: 14

## 2023-04-12 ENCOUNTER — Other Ambulatory Visit: Payer: Self-pay

## 2023-04-12 ENCOUNTER — Ambulatory Visit
Admission: RE | Admit: 2023-04-12 | Discharge: 2023-04-12 | Disposition: A | Discharge status: Home / Self Care | Payer: Medicare PPO | Source: Ambulatory Visit | Attending: Radiation Oncology

## 2023-04-12 DIAGNOSIS — Z510 Encounter for antineoplastic radiation therapy: Secondary | ICD-10-CM | POA: Diagnosis not present

## 2023-04-12 DIAGNOSIS — C44329 Squamous cell carcinoma of skin of other parts of face: Secondary | ICD-10-CM | POA: Diagnosis not present

## 2023-04-12 LAB — RAD ONC ARIA SESSION SUMMARY
Course Elapsed Days: 21
Plan Fractions Treated to Date: 15
Plan Prescribed Dose Per Fraction: 2.5 Gy
Plan Total Fractions Prescribed: 20
Plan Total Prescribed Dose: 50 Gy
Reference Point Dosage Given to Date: 37.5 Gy
Reference Point Session Dosage Given: 2.5 Gy
Session Number: 15

## 2023-04-13 ENCOUNTER — Other Ambulatory Visit: Payer: Self-pay

## 2023-04-13 ENCOUNTER — Ambulatory Visit
Admission: RE | Admit: 2023-04-13 | Discharge: 2023-04-13 | Disposition: A | Discharge status: Home / Self Care | Payer: Medicare PPO | Source: Ambulatory Visit | Attending: Radiation Oncology | Admitting: Radiation Oncology

## 2023-04-13 DIAGNOSIS — C44329 Squamous cell carcinoma of skin of other parts of face: Secondary | ICD-10-CM | POA: Diagnosis not present

## 2023-04-13 DIAGNOSIS — C44320 Squamous cell carcinoma of skin of unspecified parts of face: Secondary | ICD-10-CM | POA: Diagnosis not present

## 2023-04-13 DIAGNOSIS — Z510 Encounter for antineoplastic radiation therapy: Secondary | ICD-10-CM | POA: Diagnosis not present

## 2023-04-13 LAB — RAD ONC ARIA SESSION SUMMARY
Course Elapsed Days: 22
Plan Fractions Treated to Date: 16
Plan Prescribed Dose Per Fraction: 2.5 Gy
Plan Total Fractions Prescribed: 20
Plan Total Prescribed Dose: 50 Gy
Reference Point Dosage Given to Date: 40 Gy
Reference Point Session Dosage Given: 2.5 Gy
Session Number: 16

## 2023-04-14 ENCOUNTER — Other Ambulatory Visit: Payer: Self-pay

## 2023-04-14 ENCOUNTER — Ambulatory Visit
Admission: RE | Admit: 2023-04-14 | Discharge: 2023-04-14 | Discharge status: Home / Self Care | Payer: Medicare PPO | Source: Ambulatory Visit | Attending: Radiation Oncology

## 2023-04-14 ENCOUNTER — Ambulatory Visit
Admission: RE | Admit: 2023-04-14 | Discharge: 2023-04-14 | Disposition: A | Discharge status: Home / Self Care | Payer: Medicare PPO | Source: Ambulatory Visit | Attending: Radiation Oncology | Admitting: Radiation Oncology

## 2023-04-14 DIAGNOSIS — Z510 Encounter for antineoplastic radiation therapy: Secondary | ICD-10-CM | POA: Diagnosis not present

## 2023-04-14 DIAGNOSIS — C44329 Squamous cell carcinoma of skin of other parts of face: Secondary | ICD-10-CM | POA: Diagnosis not present

## 2023-04-14 LAB — RAD ONC ARIA SESSION SUMMARY
Course Elapsed Days: 23
Plan Fractions Treated to Date: 17
Plan Prescribed Dose Per Fraction: 2.5 Gy
Plan Total Fractions Prescribed: 20
Plan Total Prescribed Dose: 50 Gy
Reference Point Dosage Given to Date: 42.5 Gy
Reference Point Session Dosage Given: 2.5 Gy
Session Number: 17

## 2023-04-15 ENCOUNTER — Ambulatory Visit
Admission: RE | Admit: 2023-04-15 | Discharge: 2023-04-15 | Disposition: A | Discharge status: Home / Self Care | Payer: Medicare PPO | Source: Ambulatory Visit | Attending: Radiation Oncology | Admitting: Radiation Oncology

## 2023-04-15 ENCOUNTER — Ambulatory Visit: Payer: Medicare PPO

## 2023-04-15 ENCOUNTER — Other Ambulatory Visit: Payer: Self-pay

## 2023-04-15 DIAGNOSIS — Z510 Encounter for antineoplastic radiation therapy: Secondary | ICD-10-CM | POA: Diagnosis not present

## 2023-04-15 DIAGNOSIS — C44329 Squamous cell carcinoma of skin of other parts of face: Secondary | ICD-10-CM | POA: Diagnosis not present

## 2023-04-15 LAB — RAD ONC ARIA SESSION SUMMARY
Course Elapsed Days: 24
Plan Fractions Treated to Date: 18
Plan Prescribed Dose Per Fraction: 2.5 Gy
Plan Total Fractions Prescribed: 20
Plan Total Prescribed Dose: 50 Gy
Reference Point Dosage Given to Date: 45 Gy
Reference Point Session Dosage Given: 2.5 Gy
Session Number: 18

## 2023-04-16 ENCOUNTER — Ambulatory Visit: Payer: Medicare PPO

## 2023-04-16 ENCOUNTER — Ambulatory Visit
Admission: RE | Admit: 2023-04-16 | Discharge: 2023-04-16 | Disposition: A | Discharge status: Home / Self Care | Payer: Medicare PPO | Source: Ambulatory Visit | Attending: Radiation Oncology | Admitting: Radiation Oncology

## 2023-04-16 ENCOUNTER — Other Ambulatory Visit: Payer: Self-pay

## 2023-04-16 DIAGNOSIS — Z510 Encounter for antineoplastic radiation therapy: Secondary | ICD-10-CM | POA: Diagnosis not present

## 2023-04-16 DIAGNOSIS — C44329 Squamous cell carcinoma of skin of other parts of face: Secondary | ICD-10-CM | POA: Diagnosis not present

## 2023-04-16 LAB — RAD ONC ARIA SESSION SUMMARY
Course Elapsed Days: 25
Plan Fractions Treated to Date: 19
Plan Prescribed Dose Per Fraction: 2.5 Gy
Plan Total Fractions Prescribed: 20
Plan Total Prescribed Dose: 50 Gy
Reference Point Dosage Given to Date: 47.5 Gy
Reference Point Session Dosage Given: 2.5 Gy
Session Number: 19

## 2023-04-19 ENCOUNTER — Other Ambulatory Visit: Payer: Self-pay

## 2023-04-19 ENCOUNTER — Ambulatory Visit
Admission: RE | Admit: 2023-04-19 | Discharge: 2023-04-19 | Disposition: A | Discharge status: Home / Self Care | Payer: Medicare PPO | Source: Ambulatory Visit | Attending: Radiation Oncology | Admitting: Radiation Oncology

## 2023-04-19 DIAGNOSIS — C44329 Squamous cell carcinoma of skin of other parts of face: Secondary | ICD-10-CM | POA: Diagnosis not present

## 2023-04-19 DIAGNOSIS — Z510 Encounter for antineoplastic radiation therapy: Secondary | ICD-10-CM | POA: Diagnosis not present

## 2023-04-19 LAB — RAD ONC ARIA SESSION SUMMARY
Course Elapsed Days: 28
Plan Fractions Treated to Date: 20
Plan Prescribed Dose Per Fraction: 2.5 Gy
Plan Total Fractions Prescribed: 20
Plan Total Prescribed Dose: 50 Gy
Reference Point Dosage Given to Date: 50 Gy
Reference Point Session Dosage Given: 2.5 Gy
Session Number: 20

## 2023-04-20 ENCOUNTER — Encounter: Payer: Medicare PPO | Admitting: Internal Medicine

## 2023-04-20 DIAGNOSIS — Z006 Encounter for examination for normal comparison and control in clinical research program: Secondary | ICD-10-CM

## 2023-04-20 DIAGNOSIS — J849 Interstitial pulmonary disease, unspecified: Secondary | ICD-10-CM

## 2023-04-20 NOTE — Radiation Completion Notes (Addendum)
  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 The patient tolerated radiation without complaints of fatigue or skin changes in the treatment field.  The patient will receive a call in about one month from the radiation oncology department. He will continue follow up with Dr. Jeannine Boga as well.      Osker Mason, PAC

## 2023-04-20 NOTE — Research (Signed)
 Title: Chronic Fibrosing Interstitial Lung Disease with Progressive Phenotype Prospective Outcomes (ILD-PRO) Registry    Protocol #: IPF-PRO-SUB, Clinical Trials # [id number], Sponsor: [employer]   Protocol Version Amendment 6 dated 30Apr2024  and confirmed current on 10Sep2024 Consent Version for today's visit date of  Is [employer] IRB Approved Version 22May2024 Revised 29Jul2024   Objectives:  Describe current approaches to diagnosis and treatment of chronic fibrosing ILDs with progressive phenotype  Describe the natural history of chronic fibrosing ILDs with progressive phenotype  Assess quality of life from self-administered participant reported questionnaires for each disease group  Describe participant interactions with the healthcare system, describe treatment practices across multiple institutions for each disease group  Collect biological samples linked to well characterized chronic fibrosing ILDs with progressive phenotype to identify disease biomarkers  Collect data and biological samples that will support future research studies.                                            Key Inclusion Criteria: Willing and able to provide informed consent  Age >= 30 years  Diagnosis of a non-IPF ILD of any duration, including, but not limited to Idiopathic Non-Specific Interstitial Pneumonia (INSIP), Unclassifiable Idiopathic Interstitial Pneumonias (IIPs), Interstitial Pneumonia with Autoimmune Features (IPAF), Autoimmune ILDs such as Rheumatoid Arthritis (RA-ILD) and Systemic Sclerosis (SSC-ILD), Chronic Hypersensitivity Pneumonitis (HP), Sarcoidosis or Exposure-related ILDs such as asbestosis.  Chronic fibrosing ILD defined by reticular abnormality with traction bronchiectasis with or without honeycombing confirmed by chest HRCT scan and/or lung biopsy.  Progressive phenotype as defined by fulfilling at least one of the criteria below of fibrotic changes (progression set  point) within the last 24 months regardless of treatment considered appropriate in individual ILDs:  decline in FVC % predicted (% pred) based on >10% relative decline  decline in FVC % pred based on >= 5 - <10% relative decline in FVC combined with worsening of respiratory symptoms as assessed by the site investigator  decline in FVC % pred based on >= 5 - <10% relative decline in FVC combined with increasing extent of fibrotic changes on chest imaging (HRCT scan) as assessed by the site investigator  decline in DLCO % pred based on >= 10% relative decline  worsening of respiratory symptoms as well as increasing extent of fibrotic changes on chest imaging (HRCT scan) as assessed by the site investigator independent of FVC change.     Key Exclusion Criteria: Malignancy, treated or untreated, other than skin or early stage prostate cancer, within the past 5 years  Currently listed for lung transplantation at the time of enrollment  Currently enrolled in a clinical trial at the time of enrollment in this registry    Clinical Research Coordinator / Research RN note : This visit for  Subject [id number] with DOB: 19-Apr-1940 on 18Feb2025 for the above protocol is Visit/Encounter 4 and is for purpose of research.    Subject expressed continued interest and consent in continuing as a study subject. Subject confirmed that there was no change in contact information (e.g. address, telephone, email). Subject thanked for participation in research and contribution to science.     During this visit on, the subject completed the blood work and questionnaires per the above referenced protocol. Please refer to the subject's paper source binder for further details.   Signed by Despina Hick  Clinical Research Coordinator [facility]  [place], [place]

## 2023-04-26 DIAGNOSIS — E559 Vitamin D deficiency, unspecified: Secondary | ICD-10-CM | POA: Diagnosis not present

## 2023-04-26 DIAGNOSIS — M810 Age-related osteoporosis without current pathological fracture: Secondary | ICD-10-CM | POA: Diagnosis not present

## 2023-04-26 DIAGNOSIS — M8580 Other specified disorders of bone density and structure, unspecified site: Secondary | ICD-10-CM | POA: Diagnosis not present

## 2023-04-26 DIAGNOSIS — M0589 Other rheumatoid arthritis with rheumatoid factor of multiple sites: Secondary | ICD-10-CM | POA: Diagnosis not present

## 2023-04-26 DIAGNOSIS — I2510 Atherosclerotic heart disease of native coronary artery without angina pectoris: Secondary | ICD-10-CM | POA: Diagnosis not present

## 2023-04-26 DIAGNOSIS — M150 Primary generalized (osteo)arthritis: Secondary | ICD-10-CM | POA: Diagnosis not present

## 2023-04-26 DIAGNOSIS — M79643 Pain in unspecified hand: Secondary | ICD-10-CM | POA: Diagnosis not present

## 2023-04-26 DIAGNOSIS — I776 Arteritis, unspecified: Secondary | ICD-10-CM | POA: Diagnosis not present

## 2023-04-26 DIAGNOSIS — Z79899 Other long term (current) drug therapy: Secondary | ICD-10-CM | POA: Diagnosis not present

## 2023-04-27 DIAGNOSIS — H43813 Vitreous degeneration, bilateral: Secondary | ICD-10-CM | POA: Diagnosis not present

## 2023-04-27 DIAGNOSIS — H18523 Epithelial (juvenile) corneal dystrophy, bilateral: Secondary | ICD-10-CM | POA: Diagnosis not present

## 2023-04-27 DIAGNOSIS — H35373 Puckering of macula, bilateral: Secondary | ICD-10-CM | POA: Diagnosis not present

## 2023-04-27 DIAGNOSIS — Z961 Presence of intraocular lens: Secondary | ICD-10-CM | POA: Diagnosis not present

## 2023-04-27 DIAGNOSIS — H5203 Hypermetropia, bilateral: Secondary | ICD-10-CM | POA: Diagnosis not present

## 2023-04-27 DIAGNOSIS — H11153 Pinguecula, bilateral: Secondary | ICD-10-CM | POA: Diagnosis not present

## 2023-04-27 DIAGNOSIS — H52223 Regular astigmatism, bilateral: Secondary | ICD-10-CM | POA: Diagnosis not present

## 2023-04-27 DIAGNOSIS — H524 Presbyopia: Secondary | ICD-10-CM | POA: Diagnosis not present

## 2023-05-06 DIAGNOSIS — M25541 Pain in joints of right hand: Secondary | ICD-10-CM | POA: Diagnosis not present

## 2023-05-06 DIAGNOSIS — M0589 Other rheumatoid arthritis with rheumatoid factor of multiple sites: Secondary | ICD-10-CM | POA: Diagnosis not present

## 2023-05-06 DIAGNOSIS — M6281 Muscle weakness (generalized): Secondary | ICD-10-CM | POA: Diagnosis not present

## 2023-05-06 DIAGNOSIS — M25542 Pain in joints of left hand: Secondary | ICD-10-CM | POA: Diagnosis not present

## 2023-05-10 DIAGNOSIS — G4733 Obstructive sleep apnea (adult) (pediatric): Secondary | ICD-10-CM | POA: Diagnosis not present

## 2023-05-13 DIAGNOSIS — M6281 Muscle weakness (generalized): Secondary | ICD-10-CM | POA: Diagnosis not present

## 2023-05-13 DIAGNOSIS — M25542 Pain in joints of left hand: Secondary | ICD-10-CM | POA: Diagnosis not present

## 2023-05-13 DIAGNOSIS — M0589 Other rheumatoid arthritis with rheumatoid factor of multiple sites: Secondary | ICD-10-CM | POA: Diagnosis not present

## 2023-05-13 DIAGNOSIS — M25541 Pain in joints of right hand: Secondary | ICD-10-CM | POA: Diagnosis not present

## 2023-05-14 DIAGNOSIS — M6281 Muscle weakness (generalized): Secondary | ICD-10-CM | POA: Diagnosis not present

## 2023-05-14 DIAGNOSIS — M25542 Pain in joints of left hand: Secondary | ICD-10-CM | POA: Diagnosis not present

## 2023-05-14 DIAGNOSIS — M25541 Pain in joints of right hand: Secondary | ICD-10-CM | POA: Diagnosis not present

## 2023-05-14 DIAGNOSIS — M0589 Other rheumatoid arthritis with rheumatoid factor of multiple sites: Secondary | ICD-10-CM | POA: Diagnosis not present

## 2023-05-17 ENCOUNTER — Ambulatory Visit
Admission: RE | Admit: 2023-05-17 | Discharge: 2023-05-17 | Disposition: A | Discharge status: Home / Self Care | Payer: Medicare PPO | Source: Ambulatory Visit | Attending: Radiation Oncology | Admitting: Radiation Oncology

## 2023-05-17 DIAGNOSIS — C44320 Squamous cell carcinoma of skin of unspecified parts of face: Secondary | ICD-10-CM

## 2023-05-17 NOTE — Progress Notes (Signed)
 Patient was unavailable for post treatment call. Left voicemail and a number to contact us at [phone] with any questions or concerns.

## 2023-05-18 ENCOUNTER — Ambulatory Visit
Admission: RE | Admit: 2023-05-18 | Discharge: 2023-05-18 | Disposition: A | Discharge status: Home / Self Care | Payer: Medicare PPO | Source: Ambulatory Visit | Attending: Internal Medicine | Admitting: Internal Medicine

## 2023-05-18 DIAGNOSIS — I700 Atherosclerosis of aorta: Secondary | ICD-10-CM | POA: Diagnosis not present

## 2023-05-18 DIAGNOSIS — Z8739 Personal history of other diseases of the musculoskeletal system and connective tissue: Secondary | ICD-10-CM

## 2023-05-18 DIAGNOSIS — I2510 Atherosclerotic heart disease of native coronary artery without angina pectoris: Secondary | ICD-10-CM | POA: Diagnosis not present

## 2023-05-18 DIAGNOSIS — M359 Systemic involvement of connective tissue, unspecified: Secondary | ICD-10-CM

## 2023-05-18 DIAGNOSIS — J8410 Pulmonary fibrosis, unspecified: Secondary | ICD-10-CM | POA: Diagnosis not present

## 2023-05-18 DIAGNOSIS — Z7185 Encounter for immunization safety counseling: Secondary | ICD-10-CM

## 2023-05-19 ENCOUNTER — Telehealth: Payer: Self-pay

## 2023-05-19 NOTE — Telephone Encounter (Signed)
 Patient wants to know if it okay to shave? He reports that his skin "looks good". He completed radiation treatment on 2023-04-19. Please advise.

## 2023-05-20 DIAGNOSIS — M25541 Pain in joints of right hand: Secondary | ICD-10-CM | POA: Diagnosis not present

## 2023-05-20 DIAGNOSIS — M6281 Muscle weakness (generalized): Secondary | ICD-10-CM | POA: Diagnosis not present

## 2023-05-20 DIAGNOSIS — M0589 Other rheumatoid arthritis with rheumatoid factor of multiple sites: Secondary | ICD-10-CM | POA: Diagnosis not present

## 2023-05-20 DIAGNOSIS — M25542 Pain in joints of left hand: Secondary | ICD-10-CM | POA: Diagnosis not present

## 2023-05-27 DIAGNOSIS — M25542 Pain in joints of left hand: Secondary | ICD-10-CM | POA: Diagnosis not present

## 2023-05-27 DIAGNOSIS — M0589 Other rheumatoid arthritis with rheumatoid factor of multiple sites: Secondary | ICD-10-CM | POA: Diagnosis not present

## 2023-05-27 DIAGNOSIS — M25541 Pain in joints of right hand: Secondary | ICD-10-CM | POA: Diagnosis not present

## 2023-05-27 DIAGNOSIS — M6281 Muscle weakness (generalized): Secondary | ICD-10-CM | POA: Diagnosis not present

## 2023-05-28 NOTE — Progress Notes (Signed)
 Nicholas Payor, PhD, LAT, ATC acting as a scribe for [name], MD.  Nicholas Webb is a 83 y.o. male who presents to [facility] today for exacerbation of his L shoulder pain. Pt was last seen by Dr. Denyse Amass on 07/16/22 and completed 4 PT visits. Last L GH steroid injection, 06/15/22  Today, pt reports L shoulder pain returned about 2-wks. He thinks pain was caused by lifting garden soil bags.  Pt also c/o L knee pian x 2-wks. He had a L TKR about 20 years ago. R shoulder pain similarly.  Dx imaging: 06/15/22 L shoulder XR 07/04/2021 C-spine MRI   Pertinent review of systems: No fevers or chills  Relevant historical information: History of pulmonary embolism.  Pulmonary fibrosis.  Coagulation disorder on Eliquis.  Also takes methotrexate.   Exam:  BP (!) 144/78   Pulse 80   Ht 5\' 8"  (1.727 m)   Wt 191 lb (86.6 kg)   SpO2 95%   BMI 29.04 kg/m  General: Well Developed, well nourished, and in no acute distress.   MSK: Right shoulder normal-appearing Decreased range of motion abduction limited to 120 degrees. Strength is intact. Positive Hawkins and Neer's test.  Left shoulder normal-appearing Decreased range of motion abduction limited to 120 degrees. Strength is intact. Positive Hawkins and Neer's test.  Left knee: Mature scar anterior knee.  Normal motion. Moderate effusion.    Lab and Radiology Results  Procedure: Real-time Ultrasound Guided Injection of right shoulder subacromial bursa Device: Philips Affiniti 50G/GE Logiq Images permanently stored and available for review in PACS Verbal informed consent obtained.  Discussed risks and benefits of procedure. Warned about infection, bleeding, hyperglycemia damage to structures among others. Patient expresses understanding and agreement Time-out conducted.   Noted no overlying erythema, induration, or other signs of local infection.   Skin prepped in a sterile fashion.   Local  anesthesia: Topical Ethyl chloride.   With sterile technique and under real time ultrasound guidance: 40 mg of Kenalog and 2 mL of Marcaine injected into subacromial bursa. Fluid seen entering the bursa.   Completed without difficulty   Pain immediately resolved suggesting accurate placement of the medication.   Advised to call if fevers/chills, erythema, induration, drainage, or persistent bleeding.   Images permanently stored and available for review in the ultrasound unit.  Impression: Technically successful ultrasound guided injection.    Procedure: Real-time Ultrasound Guided Injection of left shoulder subacromial bursa Device: Philips Affiniti 50G/GE Logiq Images permanently stored and available for review in PACS Verbal informed consent obtained.  Discussed risks and benefits of procedure. Warned about infection, bleeding, hyperglycemia damage to structures among others. Patient expresses understanding and agreement Time-out conducted.   Noted no overlying erythema, induration, or other signs of local infection.   Skin prepped in a sterile fashion.   Local anesthesia: Topical Ethyl chloride.   With sterile technique and under real time ultrasound guidance: 40 mg of Kenalog and 2 ml of Marcaine injected into subacromial bursa. Fluid seen entering the bursa.   Completed without difficulty   Pain immediately resolved suggesting accurate placement of the medication.   Advised to call if fevers/chills, erythema, induration, drainage, or persistent bleeding.   Images permanently stored and available for review in the ultrasound unit.  Impression: Technically successful ultrasound guided injection.    X-ray images left knee obtained today personally and independently interpreted. Status post left total knee replacement.  There is a little bit of lucency distal  to the tibial stem.  I do not see any acute signs of loosening or hardware failure. Await formal radiology  review    Assessment and Plan: 83 y.o. male with bilateral shoulder pain.  This is a acute recurrence of a chronic problem.  Previously had a glenohumeral injection which worked pretty well.  Today's symptoms are more consistent with subacromial bursitis.  Plan for subacromial injection.  If this does not work consider glenohumeral injection and physical therapy.  Home exercise program taught in clinic today.  Chronic left knee pain.  Await radiology over read of x-ray.  If concern for hardware loosening or patient fails to improve could consider a three-phase bone scan.  Consider compression sleeve and Voltaren gel for now.  If all fails he may be a candidate for revision of a total knee replacement or genicular artery embolization.   PDMP not reviewed this encounter. Orders Placed This Encounter  Procedures   [place] LIMITED JOINT SPACE STRUCTURES UP BILAT(NO LINKED CHARGES)    Reason for Exam (SYMPTOM  OR DIAGNOSIS REQUIRED):   bilateral shoulder pain    Preferred imaging location?:   [facility] Knee AP/LAT W/[place] Left    Standing Status:   Future    Number of Occurrences:   1    Expiration Date:   06/30/2023    Reason for Exam (SYMPTOM  OR DIAGNOSIS REQUIRED):   left knee pain    Preferred imaging location?:   [facility]   No orders of the defined types were placed in this encounter.    Discussed warning signs or symptoms. Please see discharge instructions. Patient expresses understanding.   The above documentation has been reviewed and is accurate and complete [name], M.D.

## 2023-05-31 ENCOUNTER — Other Ambulatory Visit: Payer: Self-pay

## 2023-05-31 ENCOUNTER — Ambulatory Visit (INDEPENDENT_AMBULATORY_CARE_PROVIDER_SITE_OTHER)

## 2023-05-31 ENCOUNTER — Ambulatory Visit: Admitting: Family Medicine

## 2023-05-31 VITALS — BP 144/78 | HR 80 | Ht 68.0 in | Wt 191.0 lb

## 2023-05-31 DIAGNOSIS — M25562 Pain in left knee: Secondary | ICD-10-CM

## 2023-05-31 DIAGNOSIS — M25512 Pain in left shoulder: Secondary | ICD-10-CM | POA: Diagnosis not present

## 2023-05-31 DIAGNOSIS — G8929 Other chronic pain: Secondary | ICD-10-CM | POA: Diagnosis not present

## 2023-05-31 DIAGNOSIS — M25511 Pain in right shoulder: Secondary | ICD-10-CM | POA: Diagnosis not present

## 2023-05-31 DIAGNOSIS — Z96652 Presence of left artificial knee joint: Secondary | ICD-10-CM

## 2023-05-31 DIAGNOSIS — M85862 Other specified disorders of bone density and structure, left lower leg: Secondary | ICD-10-CM | POA: Diagnosis not present

## 2023-05-31 DIAGNOSIS — Z471 Aftercare following joint replacement surgery: Secondary | ICD-10-CM | POA: Diagnosis not present

## 2023-05-31 NOTE — Patient Instructions (Addendum)
Thank you for coming in today.   You received an injection today. Seek immediate medical attention if the joint becomes red, extremely painful, or is oozing fluid.   Please get an Xray today before you leave   Compression sleeve  Please use Voltaren gel (Generic Diclofenac Gel) up to 4x daily for pain as needed.  This is available over-the-counter as both the name brand Voltaren gel and the generic diclofenac gel.   Check back as needed

## 2023-06-03 DIAGNOSIS — M6281 Muscle weakness (generalized): Secondary | ICD-10-CM | POA: Diagnosis not present

## 2023-06-03 DIAGNOSIS — M0589 Other rheumatoid arthritis with rheumatoid factor of multiple sites: Secondary | ICD-10-CM | POA: Diagnosis not present

## 2023-06-03 DIAGNOSIS — M25542 Pain in joints of left hand: Secondary | ICD-10-CM | POA: Diagnosis not present

## 2023-06-03 DIAGNOSIS — M25541 Pain in joints of right hand: Secondary | ICD-10-CM | POA: Diagnosis not present

## 2023-06-05 ENCOUNTER — Other Ambulatory Visit: Payer: Self-pay | Admitting: Cardiology

## 2023-06-05 DIAGNOSIS — I2510 Atherosclerotic heart disease of native coronary artery without angina pectoris: Secondary | ICD-10-CM

## 2023-06-07 ENCOUNTER — Ambulatory Visit: Payer: Medicare PPO | Admitting: Internal Medicine

## 2023-06-07 ENCOUNTER — Encounter: Payer: Self-pay | Admitting: Family Medicine

## 2023-06-07 DIAGNOSIS — G4733 Obstructive sleep apnea (adult) (pediatric): Secondary | ICD-10-CM | POA: Diagnosis not present

## 2023-06-07 NOTE — Progress Notes (Signed)
 Left knee x-ray shows good looking knee replacement.  If not better we can do a three-phase bone scan to look more accurately.

## 2023-06-10 DIAGNOSIS — M0589 Other rheumatoid arthritis with rheumatoid factor of multiple sites: Secondary | ICD-10-CM | POA: Diagnosis not present

## 2023-06-10 DIAGNOSIS — M6281 Muscle weakness (generalized): Secondary | ICD-10-CM | POA: Diagnosis not present

## 2023-06-10 DIAGNOSIS — M25542 Pain in joints of left hand: Secondary | ICD-10-CM | POA: Diagnosis not present

## 2023-06-10 DIAGNOSIS — M25541 Pain in joints of right hand: Secondary | ICD-10-CM | POA: Diagnosis not present

## 2023-06-14 ENCOUNTER — Encounter: Payer: Self-pay | Admitting: Podiatry

## 2023-06-14 ENCOUNTER — Ambulatory Visit: Payer: Medicare PPO | Admitting: Podiatry

## 2023-06-14 DIAGNOSIS — D689 Coagulation defect, unspecified: Secondary | ICD-10-CM | POA: Diagnosis not present

## 2023-06-14 DIAGNOSIS — M79676 Pain in unspecified toe(s): Secondary | ICD-10-CM

## 2023-06-14 DIAGNOSIS — B351 Tinea unguium: Secondary | ICD-10-CM | POA: Diagnosis not present

## 2023-06-14 NOTE — Progress Notes (Signed)
 This patient returns to my office for at risk foot care.  This patient requires this care by a professional since this patient will be at risk due to having coagulation defect.  Patient is taking eliquis.  This patient is unable to cut nails himself since the patient cannot reach his nails.These nails are painful walking and wearing shoes.  This patient presents for at risk foot care today.  General Appearance  Alert, conversant and in no acute stress.  Vascular  Dorsalis pedis and posterior tibial  pulses are palpable  bilaterally.  Capillary return is within normal limits  bilaterally. Temperature is within normal limits  bilaterally.  Neurologic  Senn-Weinstein monofilament wire test within normal limits  bilaterally. Muscle power within normal limits bilaterally.  Nails Thick disfigured discolored nails with subungual debris  from hallux to fifth toes bilaterally. No evidence of bacterial infection or drainage bilaterally.  Orthopedic  No limitations of motion  feet .  No crepitus or effusions noted.  No bony pathology or digital deformities noted.  DJD 1st MPJ right foot. Bony prominence medial aspect both rearfeet.  Pes planus right foot.  Skin  normotropic skin with no porokeratosis noted bilaterally.  No signs of infections or ulcers noted.     Onychomycosis  Pain in right toes  Pain in left toes  Consent was obtained for treatment procedures.   Mechanical debridement of nails 1-5  bilaterally performed with a nail nipper.  Filed with dremel without incident.    Return office visit    10 weeks                  Told patient to return for periodic foot care and evaluation due to potential at risk complications.   Helane Gunther DPM

## 2023-06-17 DIAGNOSIS — M0589 Other rheumatoid arthritis with rheumatoid factor of multiple sites: Secondary | ICD-10-CM | POA: Diagnosis not present

## 2023-06-17 DIAGNOSIS — M25542 Pain in joints of left hand: Secondary | ICD-10-CM | POA: Diagnosis not present

## 2023-06-17 DIAGNOSIS — M25541 Pain in joints of right hand: Secondary | ICD-10-CM | POA: Diagnosis not present

## 2023-06-17 DIAGNOSIS — M6281 Muscle weakness (generalized): Secondary | ICD-10-CM | POA: Diagnosis not present

## 2023-06-22 ENCOUNTER — Telehealth: Payer: Self-pay | Admitting: Internal Medicine

## 2023-06-22 NOTE — Telephone Encounter (Signed)
 Copied from CRM [id number]. Topic: General - Other >> Jun 22, 2023 11:09 AM Laurina Popper O wrote: Reason for CRM: patient is calling to file a complaint about the visa card. It suppose to be a 100 dollar limit . Tried to use it several atms . Patient says he would rather have a check than the card [phone] please give patient a call back concerning the visa card

## 2023-06-22 NOTE — Telephone Encounter (Signed)
 Left voicemail for patient. I need more information. We do not give out checks or visa card. I am not sure who provided this, but it was not [facility].

## 2023-06-24 DIAGNOSIS — M6281 Muscle weakness (generalized): Secondary | ICD-10-CM | POA: Diagnosis not present

## 2023-06-24 DIAGNOSIS — M0589 Other rheumatoid arthritis with rheumatoid factor of multiple sites: Secondary | ICD-10-CM | POA: Diagnosis not present

## 2023-06-24 DIAGNOSIS — M25542 Pain in joints of left hand: Secondary | ICD-10-CM | POA: Diagnosis not present

## 2023-06-24 DIAGNOSIS — M25541 Pain in joints of right hand: Secondary | ICD-10-CM | POA: Diagnosis not present

## 2023-06-28 DIAGNOSIS — D225 Melanocytic nevi of trunk: Secondary | ICD-10-CM | POA: Diagnosis not present

## 2023-06-28 DIAGNOSIS — C4442 Squamous cell carcinoma of skin of scalp and neck: Secondary | ICD-10-CM | POA: Diagnosis not present

## 2023-06-28 DIAGNOSIS — L905 Scar conditions and fibrosis of skin: Secondary | ICD-10-CM | POA: Diagnosis not present

## 2023-06-28 DIAGNOSIS — D044 Carcinoma in situ of skin of scalp and neck: Secondary | ICD-10-CM | POA: Diagnosis not present

## 2023-06-28 DIAGNOSIS — D485 Neoplasm of uncertain behavior of skin: Secondary | ICD-10-CM | POA: Diagnosis not present

## 2023-06-28 DIAGNOSIS — L821 Other seborrheic keratosis: Secondary | ICD-10-CM | POA: Diagnosis not present

## 2023-06-28 DIAGNOSIS — Z08 Encounter for follow-up examination after completed treatment for malignant neoplasm: Secondary | ICD-10-CM | POA: Diagnosis not present

## 2023-06-28 DIAGNOSIS — L570 Actinic keratosis: Secondary | ICD-10-CM | POA: Diagnosis not present

## 2023-06-28 DIAGNOSIS — L814 Other melanin hyperpigmentation: Secondary | ICD-10-CM | POA: Diagnosis not present

## 2023-06-28 DIAGNOSIS — Z85828 Personal history of other malignant neoplasm of skin: Secondary | ICD-10-CM | POA: Diagnosis not present

## 2023-06-29 ENCOUNTER — Other Ambulatory Visit: Payer: Self-pay

## 2023-06-29 ENCOUNTER — Ambulatory Visit: Admitting: Family Medicine

## 2023-06-29 VITALS — BP 102/68 | HR 78 | Wt 180.0 lb

## 2023-06-29 DIAGNOSIS — M25512 Pain in left shoulder: Secondary | ICD-10-CM

## 2023-06-29 DIAGNOSIS — M25511 Pain in right shoulder: Secondary | ICD-10-CM | POA: Diagnosis not present

## 2023-06-29 DIAGNOSIS — G8929 Other chronic pain: Secondary | ICD-10-CM

## 2023-06-29 NOTE — Progress Notes (Unsigned)
 Joanna Muck, PhD, LAT, ATC acting as a scribe for Garlan Juniper, MD.  Nicholas Webb is a 83 y.o. male who presents to [facility] at [facility] today for L shoulder pain. Pt was last seen by Dr. Alease Hunter on 05/31/23 for bilat shoulder and L knee pain and was given bilat subacromial steroid injections  Today, pt c/o L shoulder pain persists. He locates pain primarily to the anterior aspect of the L GH joint w/ radiating pain into the mid-upper arm. No n/t  He previously had good humeral injections in both shoulders that worked great.  He returned on late March and I attempted subacromial injections as that was more consistent with his appearance on ultrasound and his pain.  However I did not work as well as it did last time.  Dx imaging: 06/15/22 L shoulder XR 07/04/2021 C-spine MRI   Pertinent review of systems: No fevers or chills  Relevant historical information: History of pulmonary embolism and current pulmonary fibrosis   Exam:  BP 102/68   Pulse 78   Wt 180 lb (81.6 kg)   SpO2 95%   BMI 27.37 kg/m  General: Well Developed, well nourished, and in no acute distress.   MSK: Shoulders bilaterally decreased range of motion.  Pain with abduction.    Lab and Radiology Results  Procedure: Real-time Ultrasound Guided Injection of right glenohumeral joint posterior approach Device: Philips Affiniti 50G/GE Logiq Images permanently stored and available for review in PACS Verbal informed consent obtained.  Discussed risks and benefits of procedure. Warned about infection, bleeding, hyperglycemia damage to structures among others. Patient expresses understanding and agreement Time-out conducted.   Noted no overlying erythema, induration, or other signs of local infection.   Skin prepped in a sterile fashion.   Local anesthesia: Topical Ethyl chloride.   With sterile technique and under real time ultrasound guidance: 40 mg of Kenalog and 2 ml of Marcaine  injected into glenohumeral joint. Fluid seen entering the joint capsule.   Completed without difficulty   Pain immediately resolved suggesting accurate placement of the medication.   Advised to call if fevers/chills, erythema, induration, drainage, or persistent bleeding.   Images permanently stored and available for review in the ultrasound unit.  Impression: Technically successful ultrasound guided injection.    Procedure: Real-time Ultrasound Guided Injection of left glenohumeral joint posterior. Device: Philips Affiniti 50G/GE Logiq Images permanently stored and available for review in PACS Verbal informed consent obtained.  Discussed risks and benefits of procedure. Warned about infection, bleeding, hyperglycemia damage to structures among others. Patient expresses understanding and agreement Time-out conducted.   Noted no overlying erythema, induration, or other signs of local infection.   Skin prepped in a sterile fashion.   Local anesthesia: Topical Ethyl chloride.   With sterile technique and under real time ultrasound guidance: 40 mg of Kenalog and 2 mL of Marcaine injected into glenohumeral joint. Fluid seen entering the joint capsule.   Completed without difficulty   Pain immediately resolved suggesting accurate placement of the medication.   Advised to call if fevers/chills, erythema, induration, drainage, or persistent bleeding.   Images permanently stored and available for review in the ultrasound unit.  Impression: Technically successful ultrasound guided injection.         Assessment and Plan: 83 y.o. male with chronic bilateral shoulder pain.  Previous subacromial injection was not effective.  Plan for glenohumeral injection which has been most effective in the past.  Can repeat this at about 3  months if needed.   PDMP not reviewed this encounter. Orders Placed This Encounter  Procedures   [place]  LIMITED JOINT SPACE STRUCTURES UP BILAT(NO LINKED CHARGES)     Reason for Exam (SYMPTOM  OR DIAGNOSIS REQUIRED):   bilateral shoulder pain    Preferred imaging location?:   [facility]   No orders of the defined types were placed in this encounter.    Discussed warning signs or symptoms. Please see discharge instructions. Patient expresses understanding.   The above documentation has been reviewed and is accurate and complete Garlan Juniper, M.D.

## 2023-06-29 NOTE — Patient Instructions (Signed)
 Thank you for coming in today.   You received an injection today. Seek immediate medical attention if the joint becomes red, extremely painful, or is oozing fluid.   I can't repeat these shots in 3 months, if needed

## 2023-06-30 DIAGNOSIS — M25542 Pain in joints of left hand: Secondary | ICD-10-CM | POA: Diagnosis not present

## 2023-06-30 DIAGNOSIS — M0589 Other rheumatoid arthritis with rheumatoid factor of multiple sites: Secondary | ICD-10-CM | POA: Diagnosis not present

## 2023-06-30 DIAGNOSIS — M6281 Muscle weakness (generalized): Secondary | ICD-10-CM | POA: Diagnosis not present

## 2023-06-30 DIAGNOSIS — M25541 Pain in joints of right hand: Secondary | ICD-10-CM | POA: Diagnosis not present

## 2023-07-08 DIAGNOSIS — M25542 Pain in joints of left hand: Secondary | ICD-10-CM | POA: Diagnosis not present

## 2023-07-08 DIAGNOSIS — M0589 Other rheumatoid arthritis with rheumatoid factor of multiple sites: Secondary | ICD-10-CM | POA: Diagnosis not present

## 2023-07-08 DIAGNOSIS — M6281 Muscle weakness (generalized): Secondary | ICD-10-CM | POA: Diagnosis not present

## 2023-07-08 DIAGNOSIS — M25541 Pain in joints of right hand: Secondary | ICD-10-CM | POA: Diagnosis not present

## 2023-07-15 DIAGNOSIS — M25542 Pain in joints of left hand: Secondary | ICD-10-CM | POA: Diagnosis not present

## 2023-07-15 DIAGNOSIS — M25541 Pain in joints of right hand: Secondary | ICD-10-CM | POA: Diagnosis not present

## 2023-07-15 DIAGNOSIS — M0589 Other rheumatoid arthritis with rheumatoid factor of multiple sites: Secondary | ICD-10-CM | POA: Diagnosis not present

## 2023-07-15 DIAGNOSIS — M6281 Muscle weakness (generalized): Secondary | ICD-10-CM | POA: Diagnosis not present

## 2023-08-02 DIAGNOSIS — M0589 Other rheumatoid arthritis with rheumatoid factor of multiple sites: Secondary | ICD-10-CM | POA: Diagnosis not present

## 2023-08-02 DIAGNOSIS — M79643 Pain in unspecified hand: Secondary | ICD-10-CM | POA: Diagnosis not present

## 2023-08-02 DIAGNOSIS — I776 Arteritis, unspecified: Secondary | ICD-10-CM | POA: Diagnosis not present

## 2023-08-02 DIAGNOSIS — M8580 Other specified disorders of bone density and structure, unspecified site: Secondary | ICD-10-CM | POA: Diagnosis not present

## 2023-08-02 DIAGNOSIS — I2510 Atherosclerotic heart disease of native coronary artery without angina pectoris: Secondary | ICD-10-CM | POA: Diagnosis not present

## 2023-08-02 DIAGNOSIS — M150 Primary generalized (osteo)arthritis: Secondary | ICD-10-CM | POA: Diagnosis not present

## 2023-08-02 DIAGNOSIS — E559 Vitamin D deficiency, unspecified: Secondary | ICD-10-CM | POA: Diagnosis not present

## 2023-08-02 DIAGNOSIS — Z79899 Other long term (current) drug therapy: Secondary | ICD-10-CM | POA: Diagnosis not present

## 2023-08-23 ENCOUNTER — Encounter: Payer: Self-pay | Admitting: Podiatry

## 2023-08-23 ENCOUNTER — Ambulatory Visit: Admitting: Podiatry

## 2023-08-23 DIAGNOSIS — M79676 Pain in unspecified toe(s): Secondary | ICD-10-CM | POA: Diagnosis not present

## 2023-08-23 DIAGNOSIS — D689 Coagulation defect, unspecified: Secondary | ICD-10-CM | POA: Diagnosis not present

## 2023-08-23 DIAGNOSIS — B351 Tinea unguium: Secondary | ICD-10-CM | POA: Diagnosis not present

## 2023-08-23 NOTE — Progress Notes (Signed)
 This patient returns to my office for at risk foot care.  This patient requires this care by a professional since this patient will be at risk due to having coagulation defect.  Patient is taking eliquis .  This patient is unable to cut nails himself since the patient cannot reach his nails.These nails are painful walking and wearing shoes.  This patient presents for at risk foot care today.  General Appearance  Alert, conversant and in no acute stress.  Vascular  Dorsalis pedis and posterior tibial  pulses are palpable  bilaterally.  Capillary return is within normal limits  bilaterally. Temperature is within normal limits  bilaterally.  Neurologic  Senn-Weinstein monofilament wire test within normal limits  bilaterally. Muscle power within normal limits bilaterally.  Nails Thick disfigured discolored nails with subungual debris  from hallux to fifth toes bilaterally. No evidence of bacterial infection or drainage bilaterally.  Orthopedic  No limitations of motion  feet .  No crepitus or effusions noted.  No bony pathology or digital deformities noted.  DJD 1st MPJ right foot. Bony prominence medial aspect both rearfeet.  Pes planus right foot.  Skin  normotropic skin with no porokeratosis noted bilaterally.  No signs of ulcers noted.   Granulation tissue distal lateral aspect right hallux.  Onychomycosis  Pain in right toes  Pain in left toes  Consent was obtained for treatment procedures.   Mechanical debridement of nails 1-5  bilaterally performed with a nail nipper.  Filed with dremel without incident.    Return office visit    10 weeks                  Told patient to return for periodic foot care and evaluation due to potential at risk complications.   Cordella Bold DPM

## 2023-08-26 DIAGNOSIS — D044 Carcinoma in situ of skin of scalp and neck: Secondary | ICD-10-CM | POA: Diagnosis not present

## 2023-08-26 DIAGNOSIS — C4442 Squamous cell carcinoma of skin of scalp and neck: Secondary | ICD-10-CM | POA: Diagnosis not present

## 2023-09-02 ENCOUNTER — Other Ambulatory Visit: Payer: Self-pay | Admitting: Cardiology

## 2023-09-02 DIAGNOSIS — I2510 Atherosclerotic heart disease of native coronary artery without angina pectoris: Secondary | ICD-10-CM

## 2023-09-08 ENCOUNTER — Encounter: Payer: Self-pay | Admitting: Internal Medicine

## 2023-09-08 ENCOUNTER — Ambulatory Visit: Admitting: Internal Medicine

## 2023-09-08 VITALS — BP 116/78 | HR 71 | Ht 68.0 in | Wt 185.8 lb

## 2023-09-08 DIAGNOSIS — R0609 Other forms of dyspnea: Secondary | ICD-10-CM

## 2023-09-08 DIAGNOSIS — J8489 Other specified interstitial pulmonary diseases: Secondary | ICD-10-CM

## 2023-09-08 DIAGNOSIS — Z862 Personal history of diseases of the blood and blood-forming organs and certain disorders involving the immune mechanism: Secondary | ICD-10-CM

## 2023-09-08 DIAGNOSIS — M359 Systemic involvement of connective tissue, unspecified: Secondary | ICD-10-CM

## 2023-09-08 DIAGNOSIS — Z7185 Encounter for immunization safety counseling: Secondary | ICD-10-CM

## 2023-09-08 DIAGNOSIS — Z8739 Personal history of other diseases of the musculoskeletal system and connective tissue: Secondary | ICD-10-CM

## 2023-09-08 NOTE — Progress Notes (Signed)
Spiro/DLCO performed today. 

## 2023-09-08 NOTE — Patient Instructions (Addendum)
 ICD-10-CM   1. Interstitial lung disease due to connective tissue disease (HCC)  J84.89    M35.9     2. DOE (dyspnea on exertion)  R06.09       Pulmonary fibrosis is clinically stable on CT scan 2025 compared to 2022 but the pulmonary function test shows worsening DLCO.  This means we need to rule out worsening anemia and/or pulmonary hypertension [2020 echocardiogram suggested pulmonary hypertension]   Plan -continue immuran, methtorexate and prednisone via Dr Leni - Check blood work for BNP, CBC NP met 09/08/2023 - Check echocardiogram anytime next few weeks - Will send a message to Dr. Jeffrie that based on the above results you might need right heart catheterization - Do spirometry and DLCO in 4-5 months - Keep up with research registry results  Follow-up - Return in 4-5 months -but after breathing test  -Symptom score and walking desaturation test at follow-up  - 15-minute visit

## 2023-09-08 NOTE — Patient Instructions (Signed)
Spiro/DLCO performed today. 

## 2023-09-08 NOTE — Progress Notes (Addendum)
 OV 07/17/2020  Subjective:  Patient ID: Nicholas Webb, male , DOB: 1940/09/30 , age 83 y.o. , MRN: [id number] , ADDRESS: [address] [place] [address] PCP Douglass Ivanoff, MD Patient Care Team: Douglass Ivanoff, MD as PCP - General (Family Medicine) Jeffrie Oneil BROCKS, MD as PCP - Cardiology (Cardiology) Porter Andrez SAUNDERS, PA-C as Physician Assistant (Dermatology) Livingston Rigg, MD as Consulting Physician (Dermatology)  This Provider for this visit: Treatment Team:  Attending Provider: Geronimo Amel, MD    07/17/2020 -   Chief Complaint  Patient presents with   Follow-up    PFT performed 06/17/20. Pt states he is about the same since last visit.   Rheumatoid arthritis with interstitial lung disease History of pulmonary embolism on Eliquis  - since April 2020 History of coronary artery disease follows Dr. Jeffrie  HPI Nicholas Webb 83 y.o. -presents for follow-up.  He is accompanied by his daughter.  This visit is to focus on discussing the results.  His symptoms are extremely mild from a respiratory standpoint documented below.  His high-resolution CT chest shows interstitial lung disease probable UIP pattern.  He also has multiple small pulmonary nodules.  There is coronary artery calcification [he is known to have coronary artery disease and follows Dr. Adela.  His pulmonary function test shows very mild restriction from his ILD.  His walking desaturation test is essentially close to normal except for tachycardia.  Taken together the burden of ILD is mild.  His daughter wants to know how to approach his ILD.  They report that he was on methotrexate for many years [greater than 10].  There was wheezing and that is why he was referred to us .  He is currently off methotrexate.  He started working out at [employer] well     HRCT Chest data march 2022 CLINICAL DATA:  Interstitial lung disease, history of rheumatoid arthritis   EXAM: CT CHEST WITHOUT CONTRAST    TECHNIQUE: Multidetector CT imaging of the chest was performed following the standard protocol without intravenous contrast. High resolution imaging of the lungs, as well as inspiratory and expiratory imaging, was performed.   COMPARISON:  None.   FINDINGS: Cardiovascular: Aortic atherosclerosis. Normal heart size. Left coronary artery calcifications and stents. No pericardial effusion.   Mediastinum/Nodes: No enlarged mediastinal, hilar, or axillary lymph nodes. Thyroid  gland, trachea, and esophagus demonstrate no significant findings.   Lungs/Pleura: There is moderate pulmonary fibrosis in a pattern with apical to basal gradient featuring irregular peripheral interstitial opacity, septal thickening, mild traction bronchiectasis, areas of subpleural bronchiolectasis without clear evidence of honeycombing. No significant air trapping on expiratory phase imaging. There multiple small pulmonary nodules in the bilateral lung apices, generally irregular in morphology and measuring up to 5 mm, for example in the left apex (series 10, image 58). No pleural effusion or pneumothorax.   Upper Abdomen: No acute abnormality.   Musculoskeletal: No chest wall mass or suspicious bone lesions identified.  IMPRESSION: 1. There is moderate pulmonary fibrosis in a pattern with apical to basal gradient featuring irregular peripheral interstitial opacity, septal thickening, mild traction bronchiectasis, and areas of subpleural bronchiolectasis without clear evidence of honeycombing. Findings are consistent with a probable UIP pattern and generally in keeping with rheumatoid associated interstitial lung disease. Findings are categorized as probable UIP per consensus guidelines: Diagnosis of Idiopathic Pulmonary Fibrosis: An Official ATS/ERS/JRS/ALAT Clinical Practice Guideline. Am JINNY Honey Crit Care Med Vol 198, Iss 5, [phone], Oct 31 2016. 2. There multiple small  nonspecific pulmonary  nodules in the bilateral lung apices, generally irregular in morphology and measuring 5 mm and smaller. No follow-up needed if patient is low-risk (and has no known or suspected primary neoplasm). Non-contrast chest CT can be considered in 12 months if patient is high-risk. This recommendation follows the consensus statement: Guidelines for Management of Incidental Pulmonary Nodules Detected on CT Images: From the Fleischner Society 2017; Radiology 2017; 284:228-243. 3. Coronary artery disease.   Aortic Atherosclerosis (ICD10-I70.0).     Electronically Signed   By: Marolyn Jaksch M.D.   On: 05/22/2020 11:58      OV 01/16/2021  Subjective:  Patient ID: Nicholas Webb, male , DOB: 22-Aug-1940 , age 72 y.o. , MRN: [id number] , ADDRESS: [address] [place] [address] PCP Douglass Ivanoff, MD (Inactive) Patient Care Team: Douglass Ivanoff, MD (Inactive) as PCP - General (Family Medicine) Jeffrie Oneil BROCKS, MD as PCP - Cardiology (Cardiology) Porter Andrez JONELLE DEVONNA as Physician Assistant (Dermatology) Livingston Rigg, MD as Consulting Physician (Dermatology)  This Provider for this visit: Treatment Team:  Attending Provider: Geronimo Amel, MD   Rheumatoid arthritis with interstitial lung disease  -Started on Imuran by rheumatology between May 2022 and November 2022  -Last HRCT March 2022 History of pulmonary embolism on Eliquis  - since April 2020 History of coronary artery disease follows Dr. Jeffrie Pulmonary nodules associated with RA ILD  01/16/2021 -   Chief Complaint  Patient presents with   Follow-up    No new concerns, pft done today     HPI Nicholas Webb 83 y.o. -presents for routine follow-up.  He feels stable.  His dyspnea score is all altered.  It is documented below.  His walking desaturation test simple is also unchanged.  He feels great.  In the interim his rheumatologist started him on Imuran for his rheumatoid arthritis pain.  He feels good overall.   However his pulmonary function test shows 3-4% decline in FVC and a 16% decline in DLCO.  He is not feeling this.  We discussed about adding antifibrotic to his Imuran with the understanding that the Imuran itself might be having a antifibrotic effect.  He does not want to start this right now.     CT Chest data  No results found.  OV 03/27/2021  Subjective:  Patient ID: Nicholas Webb, male , DOB: 1940/10/03 , age 81 y.o. , MRN: [id number] , ADDRESS: [address] [place] [address] PCP Douglass Ivanoff, MD Patient Care Team: Douglass Ivanoff, MD as PCP - General (Family Medicine) Jeffrie Oneil BROCKS, MD as PCP - Cardiology (Cardiology) Porter Andrez JONELLE, PA-C as Physician Assistant (Dermatology) Livingston Rigg, MD as Consulting Physician (Dermatology)  This Provider for this visit: Treatment Team:  Attending Provider: Geronimo Amel, MD    03/27/2021 -   Chief Complaint  Patient presents with   Follow-up    10 week follow up with pft. Pt states that breathing is the same     HPI REON HUNLEY 83 y.o. -returns for follow-up.  He presents with his daughter Leita Elbe who is nurse practitioner within the [facility].  Since his last visit he stoppe exercises at [facility].  His daughter tried to encourage him in my presence.  I also encouraged him.  However he is not that motivated.  He continues on Imuran.  He feels his breathing is the same.  His symptom score shows possibly symptoms are slightly worse but he feels the same.  We did  pulmonary function test today and compared to November 2022 it is stable but compared to spring of last year the DLCO has declined.  This decline was apparent between November 2022 in April 2022.  Since starting Imuran lung function is stable.  Overall will deem this is stability.  He is interested in participating in research protocol.  In terms of lung nodules are very small.  Very likely due to rheumatoid.  Last CT scan was in March 2022.  1 year  CT scan will be the spring 2023 or summer 2023.       OV 10/13/2021  Subjective:  Patient ID: Nicholas Webb, male , DOB: [birth year] , age 20 y.o. , MRN: [id number] , ADDRESS: [address] [place] [facility] [address] PCP Dyane Anthony RAMAN, FNP Patient Care Team: Dyane Anthony RAMAN, FNP as PCP - General (Family Medicine) Jeffrie Oneil BROCKS, MD as PCP - Cardiology (Cardiology) Porter Andrez SAUNDERS, PA-C as Physician Assistant (Dermatology) Livingston Rigg, MD as Consulting Physician (Dermatology)  This Provider for this visit: Treatment Team:  Attending Provider: Geronimo Amel, MD    10/13/2021 -   Chief Complaint  Patient presents with   Follow-up    PFT performed today.  Pt states he has been doing okay since last visit and denies any complaints.       HPI ALEXIUS HANGARTNER 83 y.o. -returns for follow-up.  He is done his ILD-Pro registry visit today.  He feels stable.  Symptom scores are mild.  Walking desaturation test is stable.  He had pulmonary function test and it is stable compared to November 2022.  November 2022 is when he started his Imuran.  Since then his pulmonary function test is stable although prior to that his pulmonary function test and his CT scan done this summer compared to pre-Imuran show progression.  Overall he is stable.  Currently is not interested in antifibrotic's.      OV 03/31/2022  Subjective:  Patient ID: Nicholas Webb, male , DOB: 11/19/1940 , age 60 y.o. , MRN: [id number] , ADDRESS: [address] [place] [facility] [address] PCP Dyane Anthony RAMAN, FNP Patient Care Team: Dyane Anthony RAMAN, FNP as PCP - General (Family Medicine) Jeffrie Oneil BROCKS, MD as PCP - Cardiology (Cardiology) Porter Andrez SAUNDERS, PA-C as Physician Assistant (Dermatology) Livingston Rigg, MD (Inactive) as Consulting Physician (Dermatology)  This Provider for this visit: Treatment Team:  Attending Provider: Geronimo Amel, MD    03/31/2022 -   Chief Complaint  Patient  presents with   Follow-up    PFT done today. Had resp infection in early dec 2023- took augmentin, now only occ cough with white sputum. His breathing is unchanged maybe a little better.      HPI KELLI EGOLF 83 y.o. -returns for routine follow-up.  Last visit August 2023.  He is doing well.  He feels he is stable.  He continues on Imuran and prednisone through rheumatology.  His symptom score is stable pulmonary function test is stable.  He is up-to-date with the rest respiratory vaccines.  I personally reviewed the pulmonary function test from today.  He also participated in ILD-Pro registry visit today as a follow-up.  His walking desaturation test is stable.      OV 09/24/2022  Subjective:  Patient ID: Nicholas Webb, male , DOB: 07/22/1940 , age 66 y.o. , MRN: [id number] , ADDRESS: [address] [place] [facility] [address] PCP Dyane Anthony RAMAN, FNP Patient Care Team: Dyane Anthony RAMAN, FNP as  PCP - General (Family Medicine) Jeffrie Oneil BROCKS, MD as PCP - Cardiology (Cardiology) Porter Andrez SAUNDERS, PA-C as Physician Assistant (Dermatology) Livingston Rigg, MD (Inactive) as Consulting Physician (Dermatology)  This Provider for this visit: Treatment Team:  Attending Provider: Geronimo Amel, MD   09/24/2022 -   Chief Complaint  Patient presents with   Follow-up    Annual f/up, no complaints     HPI Nicholas Webb 83 y.o. -returns for follow-up.  Since his last visit he tells me that his arthritis is worse than 3 months ago Dr. Leni his rheumatologist put him on prednisone 5 mg/day and it is helping.  He now has wrist splints.  Also a skin cancer not otherwise specified in his left face.  Therefore he has not shaved and is supporting a beard.  He states he will soon shave himself.  Otherwise from a respiratory standpoint he is feeling stable.  There are no new complaints.  Pulmonary function test not done today because schedule issues.  But he feels same symptoms were the  same.  We did a sit/stand exercise hypoxemia test and it shows stability.  There are no other new issues.  We did vaccine counseling.      OV 03/08/2023  Subjective:  Patient ID: Nicholas Webb, male , DOB: 24-Sep-1940 , age 58 y.o. , MRN: [id number] , ADDRESS: [address] [place] [facility] [address] PCP Dyane Anthony RAMAN, FNP Patient Care Team: Dyane Anthony RAMAN, FNP as PCP - General (Family Medicine) Jeffrie Oneil BROCKS, MD as PCP - Cardiology (Cardiology) Porter Andrez SAUNDERS, PA-C as Physician Assistant (Dermatology) Livingston Rigg, MD (Inactive) as Consulting Physician (Dermatology)  This Provider for this visit: Treatment Team:  Attending Provider: Geronimo Amel, MD  03/08/2023 -   Chief Complaint  Patient presents with   Follow-up    PFT done today. Breathing has been stable. No new co's.      HPI ALEXANDRIA CURRENT 83 y.o. -presents for follow-up.  He remains on low-dose prednisone, methotrexate and also Imuran for his rheumatoid arthritis.  He says he was started on prednisone because of joint symptoms.  This happened approximately a year ago.  Since starting prednisone he states he is gained weight.  Otherwise he feels stable.  He does not feel he is any more short of breath.  Symptom scores are stable.  Walking desaturation test is also stable but is dropped 4 points.  However his pulmonary function test I am worried is beginning to show decline particularly with the DLCO although the DLCO itself is prone to a lot of variation.  I did share this with him and in the context of CT scan a year ago showing progression I did tell him that we need to keep a closer eye.  He is willing to do this.  I will see him back in a few months with both spirometry and DLCO and CT scan.  He is immunosuppressed with multiple agents.  I recommended he talk to his primary care rheumatologist about Bactrim.      OV 09/08/2023  Subjective:  Patient ID: Nicholas Webb, male , DOB: 01/04/1941 , age 2  y.o. , MRN: [id number] , ADDRESS: [address] [place] [facility] [address] PCP Dyane Anthony RAMAN, FNP Patient Care Team: Dyane Anthony RAMAN, FNP as PCP - General (Family Medicine) Jeffrie Oneil BROCKS, MD as PCP - Cardiology (Cardiology) Porter Andrez SAUNDERS, PA-C as Physician Assistant (Dermatology) Livingston Rigg, MD (Inactive) as Consulting Physician (Dermatology)  This  Provider for this visit: Treatment Team:  Attending Provider: Geronimo Amel, MD   Rheumatoid arthritis with interstitial lung disease  -Rd - Started on Imuran by rheumatology between May 2022 and November 2022 -Ongoing methotrexate as of January 2025-?  Start date -Prednisone low-dose since May 2024. - off in July  2025  -Last HRCT March 2022 -> June 2023 with progression +  - PFT progressed April 2022 - Nov 2022 -> immuran Rx -> stable Aug 2023 - Jan 2024  - ILD PRO participant  History of pulmonary embolism on Eliquis  - since April 2020 A Fib History of coronary artery disease follows Dr. Jeffrie  -s/p stent April 2020  Pulmonary nodules associated with RA ILD - 5mm UL in march 2022 and no change Jun 2023 and spring 2025   09/08/2023 -   Chief Complaint  Patient presents with   Follow-up    PFT done today.  Breathing is doing well.      HPI BRESLIN HEMANN 83 y.o. -retrun for 6 month followup. Denies interim complaints. Feels stable. I WAs concerned at last visit that despite him feeling stableminijally symptmatic that his DLCO was decling. So HRCT was done and this is stable x since 2022. PFT today shows worsening DLCO dspite stabilt y in symptoms /minimal symptoms and stable exercise desaturation test and CT.  He has chronic anemia hb 12gm% and this is stable  CT shows enarlraged PA.  I reviewd prior records - he has DR Jeffrie as cardiolgist. Last BNP in 2020 was elevaed 314. Last ECHO in 2020 did show elevaed RSVP     SYMPTOM SCALE - ILD 07/17/2020  03/27/2021   10/13/2021  03/31/2022 immuran  09/24/2022 immuran 09/08/2023 Immuran and methoexe  O2 use ra ra ra ra ra ra  Shortness of Breath 0 -> 5 scale with 5 being worst (score 6 If unable to do)       At rest 0 0 1 0 1 0  Simple tasks - showers, clothes change, eating, shaving 0 0 1 2 1  0  Household (dishes, doing bed, laundry) 1 2 0 1 1 0  Shopping 0 0 0 1 1 0  Walking level at own pace 0 2 0 1 1 0  Walking up Stairs 1 2 1 1 2  0  Total (30-36) Dyspnea Score 2 6 3 6 7  0  How bad is your cough? minimal 1 0 3 1 1   How bad is your fatigue 0 0 1 2 1 1   How bad is nausea 0 0 0 0 1 1  How bad is vomiting?  0 0 0 0 1 1  How bad is diarrhea? 0 0 00 1 1 1   How bad is anxiety? 00 2 0 1 1 1   How bad is depression 0 0 0 1 1        Simple office walk x  3 laps goal with forehead probe 07/17/2020  01/16/2021  03/27/2021  10/13/2021  03/31/2022  09/24/2022  03/08/2023   O2 used ra ra Ra ra3 ra ra ra  Number laps completed 3 3 3 3 3  Sit stand x 15 Sit stand x 15  Comments about pace avg avg       Resting Pulse Ox/HR 98% and 74/min 97% and 71/min 99% and 67 98% and HR 64 99% and HR 70 98% an dHR 73 96% and HR HR 73  Final Pulse Ox/HR 96% and 108/min 96% and HR 106 95% and 106 96%  and HR 99 95% and HR 116 96% and HR 92 92% and HR 85  Desaturated </= 88% no no no no  no no  Desaturated <= 3% points no no yes no Yes 4 pints no no  Got Tachycardic >/= 90/min yes yes yes yes Yes yes Yes 4 ponts  Symptoms at end of test none none none No complaints  4 of 10 dyapnea No dyspnea  Miscellaneous comments x No change x x  stable         SIT STAND TEST - goal 15 times   09/08/2023    O2 used ra   PRobe - finter or forehead finger   Number sit and stand completed - goal 15 15   Time taken to complete 40 sec   Resting Pulse Ox/HR/Dyspnea  96% and 71/min and dyspnea of 1/10    Peak measures 93 % and 73/min and dyspnea of 1/10   Final Pulse Ox/HR 93% and 86/min and dyspnea of 1/10   Desaturated </= 88% no   Desaturated <= 3% points yes    Got Tachycardic >/= 90/min no   Miscellaneous comments x      PFT     Latest Ref Rng & Units 03/08/2023    1:57 PM 03/31/2022    9:22 AM 10/13/2021    2:50 PM 03/27/2021    9:38 AM 01/16/2021    9:36 AM 06/17/2020    9:31 AM  PFT Results  FVC-Pre L 3.50  3.56  3.64  3.61  3.56  3.71   FVC-Predicted Pre % 95  96  98  96  95  98   FVC-Post L      3.75   FVC-Predicted Post %      99   Pre FEV1/FVC % % 79  81  82  82  81  81   Post FEV1/FCV % %      84   FEV1-Pre L 2.78  2.88  2.98  2.95  2.88  3.01   FEV1-Predicted Pre % 107  109  113  110  108  111   FEV1-Post L      3.14   DLCO uncorrected ml/min/mmHg 14.62  15.73  15.96  15.93  15.03  18.32   DLCO UNC% % 64  68  69  69  65  78   DLCO corrected ml/min/mmHg 14.62  15.73  15.96  15.93  15.03  18.32   DLCO COR %Predicted % 64  68  69  69  65  78   DLVA Predicted % 75  83  78  80  78  96   TLC L      5.30   TLC % Predicted %      79   RV % Predicted %      67        LAB RESULTS last 96 hours No results found.   LINICAL DATA:  Diffuse/interstitial lung disease. Connective tissue disease.   EXAM: CT CHEST WITHOUT CONTRAST   TECHNIQUE: Multidetector CT imaging of the chest was performed following the standard protocol without intravenous contrast. High resolution imaging of the lungs, as well as inspiratory and expiratory imaging, was performed.   RADIATION DOSE REDUCTION: This exam was performed according to the departmental dose-optimization program which includes automated exposure control, adjustment of the mA and/or kV according to patient size and/or use of iterative reconstruction technique.   COMPARISON:  08/29/2021, 05/21/2020.   FINDINGS: Cardiovascular: Atherosclerotic  calcification of the aorta, aortic valve and coronary arteries. Enlarged pulmonic trunk and heart. Chronic trace pericardial fluid.   Mediastinum/Nodes: Chronic small mediastinal lymph nodes. No pathologically enlarged mediastinal or  axillary lymph nodes. Hilar regions are difficult to definitively evaluate without IV contrast. Esophagus is grossly unremarkable.   Lungs/Pleura: Peripheral and basilar predominant subpleural reticulation, ground-glass, traction bronchiectasis/bronchiolectasis and scattered honeycombing, stable from 08/29/2021 and likely 05/21/2020. Tiny upper lobe pulmonary nodules measure 4 mm or less in size, stable from 05/21/2020. Per Fleischner Society guidelines, no follow-up is necessary. No pleural fluid. Airway is unremarkable. There is air trapping.   Upper Abdomen: Visualized portions of the liver, gallbladder, adrenal glands, kidneys, spleen, pancreas, stomach and bowel are grossly unremarkable. No upper abdominal adenopathy.   Musculoskeletal: Degenerative changes in the spine.  Osteopenia.   IMPRESSION: 1. Pulmonary parenchymal pattern of fibrosis, as detailed above, likely stable from 05/21/2020. Findings are consistent with UIP per consensus guidelines: Diagnosis of Idiopathic Pulmonary Fibrosis: An Official ATS/ERS/JRS/ALAT Clinical Practice Guideline. Am JINNY Honey Crit Care Med Vol 198, Iss 5, [phone], Oct 31 2016. 2. Air trapping is indicative of small airways disease. 3. Aortic atherosclerosis (ICD10-I70.0). Coronary artery calcification. 4. Enlarged pulmonic trunk, indicative of pulmonary arterial hypertension.     Electronically Signed   By: Newell Eke M.D.   On: 06/07/2023 11:37      has a past medical history of Anemia of chronic disease, Atherosclerosis of both carotid arteries, Atypical mole (03/28/2013), Barrett's esophagus, Basal cell carcinoma (12/17/2015), Dizziness, GERD (gastroesophageal reflux disease), adenomatous colonic polyps, Hyperlipidemia, Macrocytosis without anemia, Obesity, OSA (obstructive sleep apnea), Rheumatoid arthritis(714.0), Squamous cell carcinoma of skin (02/25/2010), Squamous cell carcinoma of skin (02/25/2010), Squamous cell carcinoma  of skin (03/24/2011), Squamous cell carcinoma of skin (03/24/2011), Squamous cell carcinoma of skin (03/24/2011), Squamous cell carcinoma of skin (05/05/2011), Squamous cell carcinoma of skin (11/28/2013), Squamous cell carcinoma of skin (12/17/2015), Squamous cell carcinoma of skin (12/17/2015), Squamous cell carcinoma of skin (01/03/2016), Squamous cell carcinoma of skin (04/22/2016), Squamous cell carcinoma of skin (10/06/2016), Squamous cell carcinoma of skin (12/22/2016), Squamous cell carcinoma of skin (01/11/2018), Squamous cell carcinoma of skin (01/11/2018), Squamous cell carcinoma of skin (01/11/2018), Squamous cell carcinoma of skin (01/11/2018), Squamous cell carcinoma of skin (03/07/2019), Squamous cell carcinoma of skin (03/07/2019), Squamous cell carcinoma of skin (03/07/2019), Squamous cell carcinoma of skin (01/04/2020), Squamous cell carcinoma of skin (01/04/2020), Squamous cell carcinoma of skin (01/04/2020), and Syncope.   reports that he has never smoked. He has never used smokeless tobacco.  Past Surgical History:  Procedure Laterality Date   CORONARY STENT INTERVENTION N/A 06/24/2018   Procedure: CORONARY STENT INTERVENTION;  Surgeon: Verlin Lonni BIRCH, MD;  Location: [facility];  Service: Cardiovascular;  Laterality: N/A;   IR IVC FILTER PLMT / S&I /IMG GUID/MOD SED  10/07/2018   IR IVC FILTER RETRIEVAL / S&I /IMG GUID/MOD SED  06/15/2019   IR RADIOLOGIST EVAL & MGMT  09/27/2018   IR RADIOLOGIST EVAL & MGMT  01/04/2019   JOINT REPLACEMENT  2004   left   LEFT HEART CATH AND CORONARY ANGIOGRAPHY N/A 06/24/2018   Procedure: LEFT HEART CATH AND CORONARY ANGIOGRAPHY;  Surgeon: Verlin Lonni BIRCH, MD;  Location: [facility];  Service: Cardiovascular;  Laterality: N/A;   SKIN CANCER EXCISION Left 11/05/2022   Left Cheek    No Known Allergies  Immunization History  Administered Date(s) Administered   Influenza Split 11/01/2022   Influenza, High Dose  Seasonal PF 12/18/2013,  12/18/2014, 12/13/2017   Influenza, Quadrivalent, Recombinant, Inj, Pf 12/14/2016, 12/07/2018   Influenza-Unspecified 12/01/2012, 11/30/2020, 11/26/2021   PFIZER(Purple Top)SARS-COV-2 Vaccination 03/23/2019, 04/13/2019, 11/30/2020, 11/26/2021   PNEUMOCOCCAL CONJUGATE-20 07/25/2021   Pfizer Covid-19 Vaccine Bivalent Booster 43yrs & up 11/01/2022   Tdap 06/23/2018   Zoster Recombinant(Shingrix) 05/08/2017, 07/09/2017    Family History  Problem Relation Age of Onset   Stroke Mother    Glaucoma Mother    Cancer Father    Prostate cancer Father    Colon cancer Father      Current Outpatient Medications:    atorvastatin  (LIPITOR ) 40 MG tablet, TAKE 1 TABLET(40 MG) BY MOUTH DAILY, Disp: 90 tablet, Rfl: 1   azaTHIOprine (IMURAN) 50 MG tablet, Take 50 mg by mouth 2 (two) times daily., Disp: , Rfl:    denosumab  (PROLIA ) 60 MG/ML SOSY injection, Inject 60 mg into the skin every 6 (six) months., Disp: , Rfl:    ELIQUIS  5 MG TABS tablet, TAKE 1 TABLET(5 MG) BY MOUTH TWICE DAILY, Disp: 60 tablet, Rfl: 11   esomeprazole (NEXIUM) 20 MG capsule, Take 20 mg by mouth daily at 12 noon. , Disp: , Rfl:    folic acid  (FOLVITE ) 1 MG tablet, Take 1 mg by mouth daily. 1-2 daily, Disp: , Rfl:    methotrexate (RHEUMATREX) 2.5 MG tablet, Take 10 mg by mouth once a week., Disp: , Rfl:    Multiple Vitamins-Minerals (MULTIVITAMIN WITH MINERALS) tablet, Take 1 tablet by mouth daily., Disp: , Rfl:    Niacinamide-Zn-Cu-Methfo-Se-Cr (NICOTINAMIDE PO), Take 500 mg by mouth., Disp: , Rfl:    PREVIDENT 5000 PLUS 1.1 % CREA dental cream, Place 1 Application onto teeth as directed., Disp: , Rfl:       Objective:   Vitals:   09/08/23 1518 09/08/23 1521  BP: 116/78   Pulse: 71   SpO2: 96%   Weight:  185 lb 12.8 oz (84.3 kg)  Height:  5' 8 (1.727 m)    Estimated body mass index is 28.25 kg/m as calculated from the following:   Height as of this encounter: 5' 8 (1.727 m).   Weight as of  this encounter: 185 lb 12.8 oz (84.3 kg).  @WEIGHTCHANGE @  [employer]   09/08/23 1521  Weight: 185 lb 12.8 oz (84.3 kg)     Physical Exam   General: No distress. Looks well O2 at rest: no Cane present: no Sitting in wheel chair: no Frail: no Obese: no Neuro: Alert and Oriented x 3. GCS 15. Speech normal Psych: Pleasant Resp:  Barrel Chest - no.  Wheeze - no, Crackles - YES BASE, No overt respiratory distress CVS: Normal heart sounds. Murmurs - no Ext: Stigmata of Connective Tissue Disease - no HEENT: Normal upper airway. PEERL +. No post nasal drip        Assessment:       ICD-10-CM   1. Interstitial lung disease due to connective tissue disease (HCC)  J84.89 B Nat Peptide   M35.9 ECHOCARDIOGRAM COMPLETE    CBC w/Diff    Basic Metabolic Panel (BMET)    Pulmonary function test    2. DOE (dyspnea on exertion)  R06.09 B Nat Peptide    ECHOCARDIOGRAM COMPLETE    CBC w/Diff    Basic Metabolic Panel (BMET)    Pulmonary function test    3. History of anemia  Z86.2 B Nat Peptide    ECHOCARDIOGRAM COMPLETE    CBC w/Diff    Basic Metabolic Panel (BMET)    Pulmonary  function test         Plan:     Patient Instructions     ICD-10-CM   1. Interstitial lung disease due to connective tissue disease (HCC)  J84.89    M35.9     2. DOE (dyspnea on exertion)  R06.09       Pulmonary fibrosis is clinically stable on CT scan 2025 compared to 2022 but the pulmonary function test shows worsening DLCO.  This means we need to rule out worsening anemia and/or pulmonary hypertension [2020 echocardiogram suggested pulmonary hypertension]   Plan -continue immuran, methtorexate and prednisone via Dr Leni - Check blood work for BNP, CBC NP met 09/08/2023 - Check echocardiogram anytime next few weeks - Will send a message to Dr. Jeffrie that based on the above results you might need right heart catheterization - Do spirometry and DLCO in 4-5 months - Keep up with research  registry results  Follow-up - Return in 4-5 months -but after breathing test  -Symptom score and walking desaturation test at follow-up  - 15-minute visit   FOLLOWUP Return in about 18 months (around 03/10/2025) for 15 min visit, after Spiro and DLCO, with Dr Geronimo.    SIGNATURE    Dr. Dorethia Geronimo, M.D., F.C.C.P,  Pulmonary and Critical Care Medicine Staff Physician, [facility] Director - Interstitial Lung Disease  Program  Pulmonary Fibrosis [facility] at [facility] [place], [place], [address]  Pager: [phone], If no answer or between  15:00h - 7:00h: call [phone] Telephone: [phone]  5:50 PM 09/08/2023

## 2023-09-09 LAB — BASIC METABOLIC PANEL WITH GFR
BUN: 17 mg/dL (ref 6–23)
CO2: 31 meq/L (ref 19–32)
Calcium: 9.8 mg/dL (ref 8.4–10.5)
Chloride: 103 meq/L (ref 96–112)
Creatinine, Ser: 0.99 mg/dL (ref 0.40–1.50)
GFR: 71.15 mL/min (ref >60.00)
Glucose, Bld: 126 mg/dL — ABNORMAL HIGH (ref 70–99)
Potassium: 4.4 meq/L (ref 3.5–5.1)
Sodium: 139 meq/L (ref 135–145)

## 2023-09-09 LAB — CBC WITH DIFFERENTIAL/PLATELET
Basophils Absolute: 0 K/uL (ref 0.0–0.1)
Basophils Relative: 0.5 % (ref 0.0–3.0)
Eosinophils Absolute: 0.3 K/uL (ref 0.0–0.7)
Eosinophils Relative: 3.6 % (ref 0.0–5.0)
HCT: 35.3 % — ABNORMAL LOW (ref 39.0–52.0)
Hemoglobin: 11.5 g/dL — ABNORMAL LOW (ref 13.0–17.0)
Lymphocytes Relative: 32.8 % (ref 12.0–46.0)
Lymphs Abs: 2.4 K/uL (ref 0.7–4.0)
MCHC: 32.7 g/dL (ref 30.0–36.0)
MCV: 108.4 fl — ABNORMAL HIGH (ref 78.0–100.0)
Monocytes Absolute: 0.9 K/uL (ref 0.1–1.0)
Monocytes Relative: 12.6 % — ABNORMAL HIGH (ref 3.0–12.0)
Neutro Abs: 3.7 K/uL (ref 1.4–7.7)
Neutrophils Relative %: 50.5 % (ref 43.0–77.0)
Platelets: 261 K/uL (ref 150.0–400.0)
RBC: 3.26 Mil/uL — ABNORMAL LOW (ref 4.22–5.81)
RDW: 16.3 % — ABNORMAL HIGH (ref 11.5–15.5)
WBC: 7.3 K/uL (ref 4.0–10.5)

## 2023-09-09 LAB — BRAIN NATRIURETIC PEPTIDE: Pro B Natriuretic peptide (BNP): 102 pg/mL — ABNORMAL HIGH (ref 0.0–100.0)

## 2023-09-14 DIAGNOSIS — G4733 Obstructive sleep apnea (adult) (pediatric): Secondary | ICD-10-CM | POA: Diagnosis not present

## 2023-09-20 LAB — PULMONARY FUNCTION TEST
DL/VA % pred: 67 %
DL/VA: 2.62 ml/min/mmHg/L
DLCO unc % pred: 55 %
DLCO unc: 12.6 ml/min/mmHg
FEF 25-75 Pre: 2.67 L/s
FEF2575-%Pred-Pre: 158 %
FEV1-%Pred-Pre: 108 %
FEV1-Pre: 2.76 L
FEV1FVC-%Pred-Pre: 110 %
FEV6-%Pred-Pre: 103 %
FEV6-Pre: 3.48 L
FEV6FVC-%Pred-Pre: 107 %
FVC-%Pred-Pre: 96 %
Pre FEV1/FVC ratio: 79 %
Pre FEV6/FVC Ratio: 99 %

## 2023-10-07 DIAGNOSIS — Z48817 Encounter for surgical aftercare following surgery on the skin and subcutaneous tissue: Secondary | ICD-10-CM | POA: Diagnosis not present

## 2023-10-07 DIAGNOSIS — L905 Scar conditions and fibrosis of skin: Secondary | ICD-10-CM | POA: Diagnosis not present

## 2023-10-11 ENCOUNTER — Encounter (HOSPITAL_BASED_OUTPATIENT_CLINIC_OR_DEPARTMENT_OTHER): Payer: Self-pay

## 2023-10-13 ENCOUNTER — Ambulatory Visit (HOSPITAL_BASED_OUTPATIENT_CLINIC_OR_DEPARTMENT_OTHER)

## 2023-10-13 DIAGNOSIS — M359 Systemic involvement of connective tissue, unspecified: Secondary | ICD-10-CM

## 2023-10-13 DIAGNOSIS — R0609 Other forms of dyspnea: Secondary | ICD-10-CM | POA: Diagnosis not present

## 2023-10-13 DIAGNOSIS — Z862 Personal history of diseases of the blood and blood-forming organs and certain disorders involving the immune mechanism: Secondary | ICD-10-CM

## 2023-10-13 DIAGNOSIS — J8489 Other specified interstitial pulmonary diseases: Secondary | ICD-10-CM

## 2023-10-13 LAB — ECHOCARDIOGRAM COMPLETE
AV Vena cont: 0.47 cm
Area-P 1/2: 2.66 cm2
Radius: 0.8 cm
S' Lateral: 3.37 cm

## 2023-10-14 ENCOUNTER — Encounter

## 2023-10-18 ENCOUNTER — Ambulatory Visit: Admitting: Sports Medicine

## 2023-10-18 ENCOUNTER — Other Ambulatory Visit: Payer: Self-pay

## 2023-10-18 ENCOUNTER — Ambulatory Visit (INDEPENDENT_AMBULATORY_CARE_PROVIDER_SITE_OTHER)

## 2023-10-18 VITALS — BP 118/72 | HR 71 | Ht 68.0 in

## 2023-10-18 DIAGNOSIS — M51362 Other intervertebral disc degeneration, lumbar region with discogenic back pain and lower extremity pain: Secondary | ICD-10-CM | POA: Diagnosis not present

## 2023-10-18 DIAGNOSIS — M25461 Effusion, right knee: Secondary | ICD-10-CM | POA: Diagnosis not present

## 2023-10-18 DIAGNOSIS — M25551 Pain in right hip: Secondary | ICD-10-CM

## 2023-10-18 DIAGNOSIS — M25511 Pain in right shoulder: Secondary | ICD-10-CM

## 2023-10-18 DIAGNOSIS — M1711 Unilateral primary osteoarthritis, right knee: Secondary | ICD-10-CM | POA: Diagnosis not present

## 2023-10-18 DIAGNOSIS — G8929 Other chronic pain: Secondary | ICD-10-CM

## 2023-10-18 DIAGNOSIS — M5136 Other intervertebral disc degeneration, lumbar region with discogenic back pain only: Secondary | ICD-10-CM | POA: Diagnosis not present

## 2023-10-18 DIAGNOSIS — I878 Other specified disorders of veins: Secondary | ICD-10-CM | POA: Diagnosis not present

## 2023-10-18 DIAGNOSIS — M5126 Other intervertebral disc displacement, lumbar region: Secondary | ICD-10-CM | POA: Diagnosis not present

## 2023-10-18 DIAGNOSIS — M25561 Pain in right knee: Secondary | ICD-10-CM | POA: Diagnosis not present

## 2023-10-18 DIAGNOSIS — M4316 Spondylolisthesis, lumbar region: Secondary | ICD-10-CM | POA: Diagnosis not present

## 2023-10-18 DIAGNOSIS — M48061 Spinal stenosis, lumbar region without neurogenic claudication: Secondary | ICD-10-CM | POA: Diagnosis not present

## 2023-10-18 DIAGNOSIS — M5441 Lumbago with sciatica, right side: Secondary | ICD-10-CM

## 2023-10-18 DIAGNOSIS — M19011 Primary osteoarthritis, right shoulder: Secondary | ICD-10-CM

## 2023-10-18 DIAGNOSIS — M1611 Unilateral primary osteoarthritis, right hip: Secondary | ICD-10-CM | POA: Diagnosis not present

## 2023-10-18 NOTE — Progress Notes (Signed)
 Ben Hattie Aguinaldo D.CLEMENTEEN [facility] [address] [place] [address] Phone: [phone]   Assessment and Plan:     1. Chronic bilateral low back pain with right-sided sciatica (Primary) 2. Chronic pain of right knee 3. Right hip pain 6. Degeneration of intervertebral disc of lumbar region with discogenic back pain and lower extremity pain -Chronic with exacerbation, initial sports medicine visit - Patient presenting with worsening right lower extremity pain radiating from the posterior right hip to right knee.  Most consistent with lumbar radiculopathy based on physical exam, significant degenerative changes seen in lumbar spine with only mild degenerative changes seen in hip and knee joints - X-rays obtained in clinic.  My interpretation: No acute fracture or dislocation.  Lateral knee decreased joint space.  Hardware in place from prior ACL repair.  Mild degenerative hip changes.  Significant lumbar degenerative changes - Recommend further evaluation with lumbar MRI due to chronic pain with exacerbation, risk of falls due to pain, degenerative changes seen on lumbar x-ray  4. Chronic right shoulder pain 5. Primary osteoarthritis of right shoulder -Chronic with exacerbation, subsequent visit - Patient has had significant improvement with intra-articular CSI in the past.  Last CSI 06/29/2023 with 3+ months relief and pain only returning over the past 1 to 2 weeks - Patient elected for repeat intra-articular CSI.  Tolerated well per note below.  Patient increased risk of bleeding due to past medical history of anticoagulation on Eliquis   Procedure: Ultrasound Guided Glenohumeral Joint Injection Side: Right Diagnosis: Flare of osteoarthritis US  Indication:  - accuracy is paramount for diagnosis - to ensure therapeutic efficacy or procedural success - to reduce procedural risk  After explaining the procedure, viable alternatives, risks, and answering any  questions, consent was given verbally. The site was cleaned with chlorhexidine  prep. An ultrasound transducer was placed on the posterior shoulder.  The posterior capsule, labrum, and infraspinatus were identified.  A steroid injection was performed under ultrasound guidance with sterile technique using  2ml of 1% lidocaine  without epinephrine and 40 mg of triamcinolone  (KENALOG) 40mg /ml. This was well tolerated and resulted in  relief.  Needle was removed and dressing placed and post injection instructions were given including  a discussion of likely return of pain today after the anesthetic wears off (with the possibility of worsened pain) until the steroid starts to work in 1-3 days.   Pt was advised to call or return to clinic if these symptoms worsen or fail to improve as anticipated.   Images permanently stored.    Pertinent previous records reviewed include none  Follow Up: 5 days after MRI to review results and discuss treatment plan.  Could consider epidural CSI   Subjective:    Chief Complaint: Hip pain, back pain, knee pain, shoulder pain  HPI:   10/18/23 Pain in R knee and R hip for past 2 weeks. Constant pain in R GT. Using Tylenol  and topical analgesics.   Hx of R ACL repair in the 90s. Pain worse in knee when he puts more weight on his R leg/hip. Pain over lateral aspect.     Relevant Historical Information: Chronic anticoagulation on Eliquis , history of PE and DVT, CAD  Additional pertinent review of systems negative.   Current Outpatient Medications:    atorvastatin  (LIPITOR ) 40 MG tablet, TAKE 1 TABLET(40 MG) BY MOUTH DAILY, Disp: 90 tablet, Rfl: 1   azaTHIOprine (IMURAN) 50 MG tablet, Take 50 mg by mouth 2 (two) times daily., Disp: ,  Rfl:    denosumab  (PROLIA ) 60 MG/ML SOSY injection, Inject 60 mg into the skin every 6 (six) months., Disp: , Rfl:    ELIQUIS  5 MG TABS tablet, TAKE 1 TABLET(5 MG) BY MOUTH TWICE DAILY, Disp: 60 tablet, Rfl: 11   esomeprazole (NEXIUM) 20  MG capsule, Take 20 mg by mouth daily at 12 noon. , Disp: , Rfl:    folic acid  (FOLVITE ) 1 MG tablet, Take 1 mg by mouth daily. 1-2 daily, Disp: , Rfl:    methotrexate (RHEUMATREX) 2.5 MG tablet, Take 10 mg by mouth once a week., Disp: , Rfl:    Multiple Vitamins-Minerals (MULTIVITAMIN WITH MINERALS) tablet, Take 1 tablet by mouth daily., Disp: , Rfl:    Niacinamide-Zn-Cu-Methfo-Se-Cr (NICOTINAMIDE PO), Take 500 mg by mouth., Disp: , Rfl:    PREVIDENT 5000 PLUS 1.1 % CREA dental cream, Place 1 Application onto teeth as directed., Disp: , Rfl:    Objective:     Vitals:   10/18/23 1505  BP: 118/72  Pulse: 71  SpO2: 91%  Height: 5' 8 (1.727 m)      Body mass index is 28.25 kg/m.    Physical Exam:    Gen: Appears well, nad, nontoxic and pleasant Psych: Alert and oriented, appropriate mood and affect Neuro: sensation intact, strength is 5/5 in upper and lower extremities, muscle tone wnl Skin: no susupicious lesions or rashes  Back - Normal skin, Spine with normal alignment and no deformity.   No tenderness to vertebral process palpation.   Lumbar paraspinous muscles are   tender and without spasm NTTP gluteal musculature Straight leg raise positive right  Gait antalgic, favoring left leg   Electronically signed by:  Odis Mace D[facility] 3:33 PM 10/18/23

## 2023-10-18 NOTE — Patient Instructions (Signed)
 Xray lumbar MRI lumbar See me 5 days after MRI

## 2023-10-28 ENCOUNTER — Ambulatory Visit
Admission: RE | Admit: 2023-10-28 | Discharge: 2023-10-28 | Disposition: A | Discharge status: Home / Self Care | Source: Ambulatory Visit | Attending: Sports Medicine | Admitting: Sports Medicine

## 2023-10-28 DIAGNOSIS — M4316 Spondylolisthesis, lumbar region: Secondary | ICD-10-CM | POA: Diagnosis not present

## 2023-10-28 DIAGNOSIS — M48061 Spinal stenosis, lumbar region without neurogenic claudication: Secondary | ICD-10-CM | POA: Diagnosis not present

## 2023-10-28 DIAGNOSIS — G8929 Other chronic pain: Secondary | ICD-10-CM

## 2023-10-28 DIAGNOSIS — M5126 Other intervertebral disc displacement, lumbar region: Secondary | ICD-10-CM | POA: Diagnosis not present

## 2023-11-02 ENCOUNTER — Ambulatory Visit: Payer: Self-pay | Admitting: Sports Medicine

## 2023-11-02 DIAGNOSIS — L538 Other specified erythematous conditions: Secondary | ICD-10-CM | POA: Diagnosis not present

## 2023-11-02 DIAGNOSIS — Z789 Other specified health status: Secondary | ICD-10-CM | POA: Diagnosis not present

## 2023-11-02 DIAGNOSIS — L820 Inflamed seborrheic keratosis: Secondary | ICD-10-CM | POA: Diagnosis not present

## 2023-11-02 DIAGNOSIS — L570 Actinic keratosis: Secondary | ICD-10-CM | POA: Diagnosis not present

## 2023-11-02 DIAGNOSIS — C4442 Squamous cell carcinoma of skin of scalp and neck: Secondary | ICD-10-CM | POA: Diagnosis not present

## 2023-11-02 DIAGNOSIS — L2989 Other pruritus: Secondary | ICD-10-CM | POA: Diagnosis not present

## 2023-11-02 DIAGNOSIS — D485 Neoplasm of uncertain behavior of skin: Secondary | ICD-10-CM | POA: Diagnosis not present

## 2023-11-03 NOTE — Progress Notes (Unsigned)
 [facility] [address] [place] [address] Phone: [phone]   Assessment and Plan:     1. Chronic bilateral low back pain with right-sided sciatica (Primary) 2. Degeneration of intervertebral disc of lumbar region with discogenic back pain and lower extremity pain 3. Spinal stenosis of lumbar region with neurogenic claudication -Chronic with exacerbation, subsequent visit - Continued low back pain with right sided radicular symptoms.  Consistent with right lumbar radiculopathy due to degenerative changes seen on lumbar MRI - Reviewed lumbar MRI from 10/28/2023 with patient which showed: 1. Progressive diffuse lumbar disc and facet degeneration. 2. Moderate bilateral lateral recess and severe right neural foraminal stenosis at L4-5. 3. Moderate to severe left neural foraminal stenosis at L5-S1. - Recommend epidural CSI to right sided L4-L5 - Do not recommend NSAIDs due to past medical history of chronic anticoagulation on Eliquis     Pertinent previous records reviewed include lumbar MRI 10/28/2023   Follow Up: 2 weeks after epidural to review benefit.  Could consider repeat epidural   Subjective:   I, Nicholas Webb, am serving as a Neurosurgeon for Doctor [employer]  Chief Complaint: Hip pain, back pain, knee pain, shoulder pain   HPI:    10/18/23 Pain in R knee and R hip for past 2 weeks. Constant pain in R GT. Using Tylenol  and topical analgesics.    Hx of R ACL repair in the 90s. Pain worse in knee when he puts more weight on his R leg/hip. Pain over lateral aspect.      11/04/2023 Patient states he is feeling the same   Relevant Historical Information: Chronic anticoagulation on Eliquis , history of PE and DVT, CAD  Additional pertinent review of systems negative.   Current Outpatient Medications:    atorvastatin  (LIPITOR ) 40 MG tablet, TAKE 1 TABLET(40 MG) BY MOUTH DAILY, Disp: 90 tablet, Rfl: 1    azaTHIOprine (IMURAN) 50 MG tablet, Take 50 mg by mouth 2 (two) times daily., Disp: , Rfl:    denosumab  (PROLIA ) 60 MG/ML SOSY injection, Inject 60 mg into the skin every 6 (six) months., Disp: , Rfl:    ELIQUIS  5 MG TABS tablet, TAKE 1 TABLET(5 MG) BY MOUTH TWICE DAILY, Disp: 60 tablet, Rfl: 11   esomeprazole (NEXIUM) 20 MG capsule, Take 20 mg by mouth daily at 12 noon. , Disp: , Rfl:    folic acid  (FOLVITE ) 1 MG tablet, Take 1 mg by mouth daily. 1-2 daily, Disp: , Rfl:    methotrexate (RHEUMATREX) 2.5 MG tablet, Take 10 mg by mouth once a week., Disp: , Rfl:    Multiple Vitamins-Minerals (MULTIVITAMIN WITH MINERALS) tablet, Take 1 tablet by mouth daily., Disp: , Rfl:    Niacinamide-Zn-Cu-Methfo-Se-Cr (NICOTINAMIDE PO), Take 500 mg by mouth., Disp: , Rfl:    PREVIDENT 5000 PLUS 1.1 % CREA dental cream, Place 1 Application onto teeth as directed., Disp: , Rfl:    Objective:     Vitals:   11/04/23 1031  Pulse: 72  SpO2: 91%  Weight: 185 lb (83.9 kg)  Height: 5' 8 (1.727 m)      Body mass index is 28.13 kg/m.    Physical Exam:    Gen: Appears well, nad, nontoxic and pleasant Psych: Alert and oriented, appropriate mood and affect Neuro: sensation intact, strength is 5/5 in upper and lower extremities, muscle tone wnl Skin: no susupicious lesions or rashes   Back - Normal skin, Spine with normal alignment and no deformity.  No tenderness to vertebral process palpation.   Lumbar paraspinous muscles are   tender and without spasm NTTP gluteal musculature Straight leg raise positive right  Gait antalgic, favoring left leg    Electronically signed by:  Odis Mace D.[facility] 10:40 AM 11/04/23

## 2023-11-04 ENCOUNTER — Ambulatory Visit: Admitting: Sports Medicine

## 2023-11-04 VITALS — HR 72 | Ht 68.0 in | Wt 185.0 lb

## 2023-11-04 DIAGNOSIS — M51362 Other intervertebral disc degeneration, lumbar region with discogenic back pain and lower extremity pain: Secondary | ICD-10-CM

## 2023-11-04 DIAGNOSIS — M48062 Spinal stenosis, lumbar region with neurogenic claudication: Secondary | ICD-10-CM | POA: Diagnosis not present

## 2023-11-04 DIAGNOSIS — M5441 Lumbago with sciatica, right side: Secondary | ICD-10-CM | POA: Diagnosis not present

## 2023-11-04 DIAGNOSIS — G8929 Other chronic pain: Secondary | ICD-10-CM

## 2023-11-04 NOTE — Patient Instructions (Signed)
Epidural right L4-5  Follow up 2 weeks after to discuss results

## 2023-11-05 ENCOUNTER — Ambulatory Visit: Admitting: Podiatry

## 2023-11-05 ENCOUNTER — Encounter: Payer: Self-pay | Admitting: Podiatry

## 2023-11-05 DIAGNOSIS — M79676 Pain in unspecified toe(s): Secondary | ICD-10-CM

## 2023-11-05 DIAGNOSIS — B351 Tinea unguium: Secondary | ICD-10-CM

## 2023-11-05 DIAGNOSIS — D689 Coagulation defect, unspecified: Secondary | ICD-10-CM

## 2023-11-05 NOTE — Progress Notes (Signed)
 This patient returns to my office for at risk foot care.  This patient requires this care by a professional since this patient will be at risk due to having coagulation defect.  Patient is taking eliquis .  This patient is unable to cut nails himself since the patient cannot reach his nails.These nails are painful walking and wearing shoes.  This patient presents for at risk foot care today.  General Appearance  Alert, conversant and in no acute stress.  Vascular  Dorsalis pedis and posterior tibial  pulses are palpable  bilaterally.  Capillary return is within normal limits  bilaterally. Temperature is within normal limits  bilaterally.  Neurologic  Senn-Weinstein monofilament wire test within normal limits  bilaterally. Muscle power within normal limits bilaterally.  Nails Thick disfigured discolored nails with subungual debris  from hallux to fifth toes bilaterally. No evidence of bacterial infection or drainage bilaterally.  Orthopedic  No limitations of motion  feet .  No crepitus or effusions noted.  No bony pathology or digital deformities noted.  DJD 1st MPJ right foot. Bony prominence medial aspect both rearfeet.  Pes planus right foot.  Skin  normotropic skin with no porokeratosis noted bilaterally.  No signs of ulcers noted.   Granulation tissue distal lateral aspect right hallux.  Onychomycosis  Pain in right toes  Pain in left toes  Consent was obtained for treatment procedures.   Mechanical debridement of nails 1-5  bilaterally performed with a nail nipper.  Filed with dremel without incident.    Return office visit    10 weeks                  Told patient to return for periodic foot care and evaluation due to potential at risk complications.   Cordella Bold DPM

## 2023-11-08 DIAGNOSIS — Z79899 Other long term (current) drug therapy: Secondary | ICD-10-CM | POA: Diagnosis not present

## 2023-11-08 DIAGNOSIS — J849 Interstitial pulmonary disease, unspecified: Secondary | ICD-10-CM | POA: Diagnosis not present

## 2023-11-08 DIAGNOSIS — M810 Age-related osteoporosis without current pathological fracture: Secondary | ICD-10-CM | POA: Diagnosis not present

## 2023-11-08 DIAGNOSIS — I776 Arteritis, unspecified: Secondary | ICD-10-CM | POA: Diagnosis not present

## 2023-11-08 DIAGNOSIS — M0589 Other rheumatoid arthritis with rheumatoid factor of multiple sites: Secondary | ICD-10-CM | POA: Diagnosis not present

## 2023-11-08 DIAGNOSIS — M150 Primary generalized (osteo)arthritis: Secondary | ICD-10-CM | POA: Diagnosis not present

## 2023-11-09 ENCOUNTER — Telehealth: Payer: Self-pay

## 2023-11-09 NOTE — Telephone Encounter (Signed)
   Pre-operative Risk Assessment    Patient Name: Nicholas Webb  DOB: [birth year] MRN: [id number]   Date of last office visit: 12/03/22 ONEIL PARCHMENT, MD Date of next office visit: 11/15/23 ONEIL PARCHMENT, MD   Request for Surgical Clearance    Procedure:  LUMBAR EPIDURAL  Date of Surgery:  Clearance TBD                                Surgeon:  NOT INDICATED Surgeon's Group or Practice Name:  [facility] Phone number:  [phone] Fax number:  [phone]   Type of Clearance Requested:   - Medical  - Pharmacy:  Hold Apixaban  (Eliquis ) 4 DOSES   Type of Anesthesia:  Not Indicated   Additional requests/questions:    Signed, Lucie DELENA Ku   11/09/2023, 5:45 PM

## 2023-11-10 ENCOUNTER — Encounter: Payer: Self-pay | Admitting: Cardiology

## 2023-11-10 ENCOUNTER — Encounter: Payer: Self-pay | Admitting: Internal Medicine

## 2023-11-10 ENCOUNTER — Encounter: Payer: Self-pay | Admitting: Oncology

## 2023-11-10 NOTE — Telephone Encounter (Signed)
 Orencia - > has been linked in copd (you do not have) with respiratory infections. Apparently there are rare reports of ILD getting worse (I have not seen it clinically)  Alternative  Can the rheumatologist try TOCILIZUMAB (trade name: Actemra or generic Tyenne) - this helps stabilize fibrosis and joint issues. Check wit then please. I am happy to give the new doc a call if you wish. What is his/her name?

## 2023-11-10 NOTE — Telephone Encounter (Signed)
 Forwarded to MD.

## 2023-11-10 NOTE — Telephone Encounter (Signed)
**Note De-identified  [place] Obfuscation** Please advise 

## 2023-11-12 NOTE — Telephone Encounter (Signed)
 No concerns for interaction with his cardiac medications, and no cardiac concerns with Orencia

## 2023-11-15 ENCOUNTER — Encounter: Payer: Self-pay | Admitting: Cardiology

## 2023-11-15 ENCOUNTER — Ambulatory Visit: Attending: Cardiology | Admitting: Cardiology

## 2023-11-15 VITALS — BP 120/69 | HR 69 | Ht 68.0 in | Wt 183.0 lb

## 2023-11-15 DIAGNOSIS — R000 Tachycardia, unspecified: Secondary | ICD-10-CM | POA: Diagnosis not present

## 2023-11-15 DIAGNOSIS — I4510 Unspecified right bundle-branch block: Secondary | ICD-10-CM | POA: Diagnosis not present

## 2023-11-15 DIAGNOSIS — I5032 Chronic diastolic (congestive) heart failure: Secondary | ICD-10-CM | POA: Diagnosis not present

## 2023-11-15 DIAGNOSIS — I2510 Atherosclerotic heart disease of native coronary artery without angina pectoris: Secondary | ICD-10-CM | POA: Diagnosis not present

## 2023-11-15 DIAGNOSIS — R55 Syncope and collapse: Secondary | ICD-10-CM

## 2023-11-15 DIAGNOSIS — R001 Bradycardia, unspecified: Secondary | ICD-10-CM | POA: Diagnosis not present

## 2023-11-15 DIAGNOSIS — G4733 Obstructive sleep apnea (adult) (pediatric): Secondary | ICD-10-CM | POA: Diagnosis not present

## 2023-11-15 NOTE — Patient Instructions (Signed)

## 2023-11-15 NOTE — Telephone Encounter (Signed)
 Patient is on Eliquis  for history of multiple PE. Patient is being followed by hematology/oncology and seen by Dr. Arley Hof. Will defer anticoagulation to heme/onc.

## 2023-11-15 NOTE — Progress Notes (Signed)
 Cardiology Office Note:  .   Date:  11/15/2023  ID:  Nicholas Webb, DOB April 27, 1940, MRN [id number] PCP: Nicholas Anthony RAMAN, FNP  [facility] Providers Cardiologist:  Oneil Parchment, MD     History of Present Illness: .   Nicholas Webb is a 83 y.o. male Discussed the use of AI scribe software for clinical note transcription with the patient, who gave verbal consent to proceed.  History of Present Illness Nicholas Webb is an 83 year old with coronary artery disease who presents for follow-up.  He has a history of coronary artery disease with a stent placed in the proximal LAD in 2020. He is on atorvastatin  40 mg daily, which has resulted in excellent cholesterol levels, with an LDL of 49. No chest pain is reported.  He has interstitial lung disease, as seen on high-resolution CT. He experiences occasional shortness of breath, which he attributes to age and reduced physical activity. He works at an [employer], washing cars two days a week, and aims for 3,000 steps a day.  He has a history of bilateral pulmonary embolism and had an IVC filter placed and subsequently removed. He is on lifelong Eliquis  5 mg twice a day, with hematology follow-up. No issues with Eliquis  are reported.  He experiences frequent premature atrial contractions (PACs), which have led to a falsely lowered heart rate on pulse oximetry. His heart rate was noted to be in the 30s during a recent visit to a sleep doctor, but EKGs have not shown any concerning findings.  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  He is on methotrexate 2.5 mg, taking four tablets once weekly, and Imuran for arthritis. He also receives Prolia  for bone health.   Studies Reviewed: SABRA   EKG Interpretation Date/Time:  Monday November 15 2023 14:29:32 EDT Ventricular Rate:  70 PR Interval:  144 QRS Duration:  128 QT Interval:  426 QTC Calculation: 460 R Axis:   -3  Text  Interpretation: Normal sinus rhythm Right bundle branch block When compared with ECG of 03-Dec-2022 14:05, No significant change was found Confirmed by Parchment Oneil ([id number]) on 11/15/2023 2:34:15 PM    Results LABS LDL: 49 mg/dL Hemoglobin: 88.4 g/dL Creatinine: 9.00 mg/dL Risk Assessment/Calculations:            Physical Exam:   VS:  BP 120/69 (BP Location: Left Arm, Patient Position: Sitting, Cuff Size: Large)   Pulse 69   Ht 5' 8 (1.727 m)   Wt 183 lb (83 kg)   SpO2 95%   BMI 27.83 kg/m    Wt Readings from Last 3 Encounters:  11/15/23 183 lb (83 kg)  11/04/23 185 lb (83.9 kg)  09/08/23 185 lb 12.8 oz (84.3 kg)    GEN: Well nourished, well developed in no acute distress NECK: No JVD; No carotid bruits CARDIAC: RRR, no murmurs, no rubs, no gallops RESPIRATORY:  Clear to auscultation without rales, wheezing or rhonchi  ABDOMEN: Soft, non-tender, non-distended EXTREMITIES:  No edema; No deformity   ASSESSMENT AND PLAN: .    Assessment and Plan Assessment & Plan Coronary artery disease status post LAD stent placement Coronary artery disease with stent placement in the proximal LAD in 2020. Currently well-controlled with no chest pain or dyspnea. LDL levels at 49 mg/dL on atorvastatin  40 mg daily. - Continue atorvastatin  40 mg daily.  Atrial fibrillation with history of pulmonary embolism on lifelong anticoagulation Atrial fibrillation with  bilateral pulmonary embolism. On lifelong anticoagulation with Eliquis  5 mg twice daily. - Continue Eliquis  5 mg twice daily.  Right bundle branch block Right bundle branch block with no new symptoms. Frequent PACs causing falsely lowered heart rate on pulse oximeter.  Interstitial lung disease Progressive interstitial lung disease with crackles on examination. Dyspnea may be related to age and decreased physical activity.  Anemia Anemia with hemoglobin level of 11.5 g/dL. No specific concerns or changes  discussed.  Osteoporosis Osteoporosis managed with Prolia . - Administer Prolia  injection as scheduled.  Rheumatoid arthritis Rheumatoid arthritis managed with methotrexate 10 mg weekly. No new concerns reported. - Continue methotrexate 10 mg weekly. - Ensure prescription refill is obtained.         Dispo: 1 yr  Signed, Oneil Parchment, MD

## 2023-11-16 DIAGNOSIS — M810 Age-related osteoporosis without current pathological fracture: Secondary | ICD-10-CM | POA: Diagnosis not present

## 2023-11-17 NOTE — Telephone Encounter (Signed)
   Patient Name: Nicholas Webb  DOB: 1940-03-11 MRN: [id number]  Primary Cardiologist: Oneil Parchment, MD  Chart reviewed as part of pre-operative protocol coverage. Pt was recently seen in clinic by Dr. Parchment on 11/15/2023. Per Dr. Parchment, He may proceed with lumbar epidural. Hold Eliquis  for 3 days prior. Low risk from a cardiac standpoint. Oneil Parchment, MD  Therefore, given past medical history and time since last visit, based on ACC/AHA guidelines,  Nicholas Webb is at acceptable risk for the planned procedure without further cardiovascular testing.   From a cardiac standpoint, pt may hold Eliquis  for 3 days prior to procedure as above. However, patient is on Eliquis  for history of multiple PE. Patient is being followed by hematology/oncology and seen by Dr. Arley Hof. Will defer final recommendations regarding anticoagulation to heme/onc.    I will route this recommendation to the requesting party via Epic fax function and remove from pre-op pool.  Please call with questions.  Nicholas JAYSON Braver, NP 11/17/2023, 2:44 PM

## 2023-11-25 DIAGNOSIS — C4442 Squamous cell carcinoma of skin of scalp and neck: Secondary | ICD-10-CM | POA: Diagnosis not present

## 2023-12-06 ENCOUNTER — Encounter: Payer: Self-pay | Admitting: Sports Medicine

## 2023-12-06 NOTE — Discharge Instructions (Signed)

## 2023-12-08 ENCOUNTER — Ambulatory Visit
Admission: RE | Admit: 2023-12-08 | Discharge: 2023-12-08 | Disposition: A | Discharge status: Home / Self Care | Source: Ambulatory Visit | Attending: Sports Medicine | Admitting: Sports Medicine

## 2023-12-08 DIAGNOSIS — M545 Low back pain, unspecified: Secondary | ICD-10-CM | POA: Diagnosis not present

## 2023-12-08 DIAGNOSIS — G8929 Other chronic pain: Secondary | ICD-10-CM

## 2023-12-08 MED ORDER — IOPAMIDOL (ISOVUE-M 200) INJECTION 41%
1.0000 mL | Freq: Once | INTRAMUSCULAR | Status: AC
  Administered 2023-12-08: 1 mL via EPIDURAL

## 2023-12-08 MED ORDER — METHYLPREDNISOLONE ACETATE 40 MG/ML INJ SUSP (RADIOLOG
80.0000 mg | Freq: Once | INTRAMUSCULAR | Status: AC
  Administered 2023-12-08: 80 mg via EPIDURAL

## 2023-12-10 DIAGNOSIS — E785 Hyperlipidemia, unspecified: Secondary | ICD-10-CM | POA: Diagnosis not present

## 2023-12-10 DIAGNOSIS — I2510 Atherosclerotic heart disease of native coronary artery without angina pectoris: Secondary | ICD-10-CM | POA: Diagnosis not present

## 2023-12-10 DIAGNOSIS — I5032 Chronic diastolic (congestive) heart failure: Secondary | ICD-10-CM | POA: Diagnosis not present

## 2023-12-10 DIAGNOSIS — R7303 Prediabetes: Secondary | ICD-10-CM | POA: Diagnosis not present

## 2023-12-10 DIAGNOSIS — M069 Rheumatoid arthritis, unspecified: Secondary | ICD-10-CM | POA: Diagnosis not present

## 2023-12-10 DIAGNOSIS — G4733 Obstructive sleep apnea (adult) (pediatric): Secondary | ICD-10-CM | POA: Diagnosis not present

## 2023-12-10 DIAGNOSIS — C44319 Basal cell carcinoma of skin of other parts of face: Secondary | ICD-10-CM | POA: Diagnosis not present

## 2023-12-10 DIAGNOSIS — D649 Anemia, unspecified: Secondary | ICD-10-CM | POA: Diagnosis not present

## 2023-12-10 DIAGNOSIS — Z0000 Encounter for general adult medical examination without abnormal findings: Secondary | ICD-10-CM | POA: Diagnosis not present

## 2023-12-12 ENCOUNTER — Ambulatory Visit: Payer: Self-pay | Admitting: Internal Medicine

## 2023-12-12 NOTE — Progress Notes (Signed)
 Unliely there is pulmnary hypertension right now bsed on echo Xxxxxx Details  Reading Physician Reading Date Result Priority  Lonni Slain, MD [phone]  10/13/2023    Result Text      ECHOCARDIOGRAM REPORT         Patient Name:   [facility]icholas [facility] Date of Exam: 10/13/2023 Medical Rec #:  [id number]          Height:       68.0 in Accession #:    [id number]         Weight:       185.8 lb Date of Birth:  October 04, 1940           BSA:          1.981 m Patient Age:    83 years           BP:           120/70 mmHg Patient Gender: M                  HR:           70 bpm. Exam Location:  Outpatient   Procedure: 2D Echo, 3D Echo, Cardiac Doppler, Color Doppler and Strain Analysis            (Both Spectral and Color Flow Doppler were utilized during            procedure).   Indications:    Dyspnea   History:        Patient has prior history of Echocardiogram examinations, most                 recent 06/24/2018. Previous Myocardial Infarction and CAD,                 Arrythmias:RBBB, Signs/Symptoms:Dizziness/Lightheadedness and                 Syncope; Risk Factors:Non-Smoker. Interstitial lung                 disease;prior bilateral pulmonary embolism.   Sonographer:    Orvil Holmes RDCS Referring Phys: 70 Chavonne Sforza   IMPRESSIONS      1. Left ventricular ejection fraction, by estimation, is 55 to 60%. The left ventricle has normal function. The left ventricle has no regional wall motion abnormalities. Left ventricular diastolic parameters are indeterminate. The average left  ventricular global longitudinal strain is -13.4 %. The global longitudinal strain is abnormal.  2. Right ventricular systolic function is normal. The right ventricular size is mildly enlarged. There is normal pulmonary artery systolic pressure. The estimated right ventricular systolic pressure is 32.6 mmHg.  3. Left atrial size was moderately dilated.  4. Right atrial size was moderately  dilated.  5. The mitral valve is normal in structure. Trivial mitral valve regurgitation. No evidence of mitral stenosis.  6. The aortic valve is tricuspid. Aortic valve regurgitation is mild. No aortic stenosis is present.  7. The inferior vena cava is dilated in size with >50% respiratory variability, suggesting right atrial pressure of 8 mmHg.   Comparison(s): Prior images reviewed side by side.   Conclusion(s)/Recommendation(s): Otherwise normal echocardiogram, with minor abnormalities described in the report.

## 2023-12-16 ENCOUNTER — Encounter: Payer: Self-pay | Admitting: Oncology

## 2023-12-22 NOTE — Progress Notes (Unsigned)
 Ben Jackson D[facility] [address] [place] [address] Phone: [phone]   Assessment and Plan:     1. Chronic bilateral low back pain with right-sided sciatica (Primary) 2. Degeneration of intervertebral disc of lumbar region with discogenic back pain and lower extremity pain 3. Spinal stenosis of lumbar region with neurogenic claudication -Chronic with exacerbation, subsequent visit - Overall significant improvement in low back pain and right-sided radicular symptoms after epidural CSI performed on 12/08/2023 to right sided L4-L5 - Patient had 90% relief from epidural CSI, decreased need for pain medication, and improved function after CSI.  Could consider repeat CSI in the future if pain returns - Do not recommend p.o. NSAIDs due to past medical history of chronic anticoagulation on Eliquis     Pertinent previous records reviewed include epidural procedure note 12/08/2023   Follow Up: As needed if no improvement or worsening of symptoms.  If pain returns identically, we can order repeat right sided L4-L5 epidural.  If pain changes, recommend follow-up in clinic for evaluation   Subjective:   I, Chestine Reeves, am serving as a Neurosurgeon for [employer]   Chief Complaint: Hip pain, back pain, knee pain, shoulder pain   HPI:    10/18/23 Pain in R knee and R hip for past 2 weeks. Constant pain in R GT. Using Tylenol  and topical analgesics.    Hx of R ACL repair in the 90s. Pain worse in knee when he puts more weight on his R leg/hip. Pain over lateral aspect.    11/04/2023 Patient states he is feeling the same  12/23/2023 Patient states thinks shot did great    Relevant Historical Information: Chronic anticoagulation on Eliquis , history of PE and DVT, CAD  Additional pertinent review of systems negative.   Current Outpatient Medications:    atorvastatin  (LIPITOR ) 40 MG tablet, TAKE 1 TABLET(40 MG) BY MOUTH DAILY,  Disp: 90 tablet, Rfl: 1   azaTHIOprine (IMURAN) 50 MG tablet, Take 50 mg by mouth 2 (two) times daily., Disp: , Rfl:    denosumab  (PROLIA ) 60 MG/ML SOSY injection, Inject 60 mg into the skin every 6 (six) months., Disp: , Rfl:    ELIQUIS  5 MG TABS tablet, TAKE 1 TABLET(5 MG) BY MOUTH TWICE DAILY, Disp: 60 tablet, Rfl: 11   esomeprazole (NEXIUM) 20 MG capsule, Take 20 mg by mouth daily at 12 noon. , Disp: , Rfl:    folic acid  (FOLVITE ) 1 MG tablet, Take 1 mg by mouth daily. 1-2 daily, Disp: , Rfl:    methotrexate (RHEUMATREX) 2.5 MG tablet, Take 10 mg by mouth once a week., Disp: , Rfl:    Multiple Vitamins-Minerals (MULTIVITAMIN WITH MINERALS) tablet, Take 1 tablet by mouth daily., Disp: , Rfl:    Niacinamide-Zn-Cu-Methfo-Se-Cr (NICOTINAMIDE PO), Take 500 mg by mouth., Disp: , Rfl:    PREVIDENT 5000 PLUS 1.1 % CREA dental cream, Place 1 Application onto teeth as directed., Disp: , Rfl:    Objective:     Vitals:   12/23/23 0930  BP: 106/80  Pulse: 75  SpO2: 90%  Weight: 186 lb (84.4 kg)  Height: 5' 8 (1.727 m)      Body mass index is 28.28 kg/m.    Physical Exam:    Gen: Appears well, nad, nontoxic and pleasant Psych: Alert and oriented, appropriate mood and affect Neuro: sensation intact, strength is 5/5 in upper and lower extremities, muscle tone wnl Skin: no susupicious lesions or rashes  Back -  Normal skin, Spine with normal alignment and no deformity.   No tenderness to vertebral process palpation.   Paraspinous muscles are not tender and without spasm NTTP gluteal musculature Straight leg raise negative Trendelenberg negative Piriformis Test negative Gait normal    Electronically signed by:  Odis Mace D[facility] 9:37 AM 12/23/23

## 2023-12-23 ENCOUNTER — Ambulatory Visit: Admitting: Sports Medicine

## 2023-12-23 VITALS — BP 106/80 | HR 75 | Ht 68.0 in | Wt 186.0 lb

## 2023-12-23 DIAGNOSIS — M51362 Other intervertebral disc degeneration, lumbar region with discogenic back pain and lower extremity pain: Secondary | ICD-10-CM

## 2023-12-23 DIAGNOSIS — G8929 Other chronic pain: Secondary | ICD-10-CM | POA: Diagnosis not present

## 2023-12-23 DIAGNOSIS — M5441 Lumbago with sciatica, right side: Secondary | ICD-10-CM | POA: Diagnosis not present

## 2023-12-23 DIAGNOSIS — M48062 Spinal stenosis, lumbar region with neurogenic claudication: Secondary | ICD-10-CM | POA: Diagnosis not present

## 2023-12-23 NOTE — Patient Instructions (Signed)
 As needed follow up   If pain return identically call and ask for repeat epidural   If pain changes follow up in clinic

## 2023-12-30 DIAGNOSIS — L820 Inflamed seborrheic keratosis: Secondary | ICD-10-CM | POA: Diagnosis not present

## 2023-12-30 DIAGNOSIS — D1801 Hemangioma of skin and subcutaneous tissue: Secondary | ICD-10-CM | POA: Diagnosis not present

## 2023-12-30 DIAGNOSIS — Z85828 Personal history of other malignant neoplasm of skin: Secondary | ICD-10-CM | POA: Diagnosis not present

## 2023-12-30 DIAGNOSIS — L814 Other melanin hyperpigmentation: Secondary | ICD-10-CM | POA: Diagnosis not present

## 2023-12-30 DIAGNOSIS — L821 Other seborrheic keratosis: Secondary | ICD-10-CM | POA: Diagnosis not present

## 2023-12-30 DIAGNOSIS — Z08 Encounter for follow-up examination after completed treatment for malignant neoplasm: Secondary | ICD-10-CM | POA: Diagnosis not present

## 2023-12-30 DIAGNOSIS — Z789 Other specified health status: Secondary | ICD-10-CM | POA: Diagnosis not present

## 2023-12-30 DIAGNOSIS — L2989 Other pruritus: Secondary | ICD-10-CM | POA: Diagnosis not present

## 2023-12-30 DIAGNOSIS — L538 Other specified erythematous conditions: Secondary | ICD-10-CM | POA: Diagnosis not present

## 2023-12-31 ENCOUNTER — Inpatient Hospital Stay: Payer: Medicare PPO | Attending: Oncology | Admitting: Oncology

## 2023-12-31 ENCOUNTER — Encounter: Payer: Self-pay | Admitting: Oncology

## 2023-12-31 VITALS — BP 100/65 | HR 85 | Temp 97.6°F | Resp 18 | Ht 68.0 in | Wt 183.3 lb

## 2023-12-31 DIAGNOSIS — I214 Non-ST elevation (NSTEMI) myocardial infarction: Secondary | ICD-10-CM | POA: Insufficient documentation

## 2023-12-31 DIAGNOSIS — D7589 Other specified diseases of blood and blood-forming organs: Secondary | ICD-10-CM | POA: Insufficient documentation

## 2023-12-31 DIAGNOSIS — Z8719 Personal history of other diseases of the digestive system: Secondary | ICD-10-CM | POA: Diagnosis not present

## 2023-12-31 DIAGNOSIS — Z7952 Long term (current) use of systemic steroids: Secondary | ICD-10-CM | POA: Insufficient documentation

## 2023-12-31 DIAGNOSIS — Z86711 Personal history of pulmonary embolism: Secondary | ICD-10-CM | POA: Diagnosis not present

## 2023-12-31 DIAGNOSIS — Z86718 Personal history of other venous thrombosis and embolism: Secondary | ICD-10-CM | POA: Diagnosis not present

## 2023-12-31 DIAGNOSIS — I252 Old myocardial infarction: Secondary | ICD-10-CM | POA: Diagnosis not present

## 2023-12-31 DIAGNOSIS — I2699 Other pulmonary embolism without acute cor pulmonale: Secondary | ICD-10-CM

## 2023-12-31 DIAGNOSIS — Z79899 Other long term (current) drug therapy: Secondary | ICD-10-CM | POA: Insufficient documentation

## 2023-12-31 DIAGNOSIS — Z87898 Personal history of other specified conditions: Secondary | ICD-10-CM | POA: Insufficient documentation

## 2023-12-31 DIAGNOSIS — D333 Benign neoplasm of cranial nerves: Secondary | ICD-10-CM | POA: Insufficient documentation

## 2023-12-31 DIAGNOSIS — M069 Rheumatoid arthritis, unspecified: Secondary | ICD-10-CM | POA: Diagnosis not present

## 2023-12-31 DIAGNOSIS — I2510 Atherosclerotic heart disease of native coronary artery without angina pectoris: Secondary | ICD-10-CM | POA: Insufficient documentation

## 2023-12-31 NOTE — Progress Notes (Signed)
 [facility] OFFICE PROGRESS NOTE   Diagnosis: History of a skull mass, pulmonary embolism  INTERVAL HISTORY:   Nicholas Webb returns as scheduled.  He generally feels well.  He bleeds and bruises easily with trauma.  He continues anticoagulation therapy.  No symptom of recurrent thrombosis.  He is followed by dermatology for scalp skin cancers.  He saw dermatology this week.  No head pain.  No neurologic symptoms.  He recently had a lumbar spine steroid injection and reports improvement in pain.  He is followed by rheumatology for rheumatoid arthritis.  Objective:  Vital signs in last 24 hours:  Blood pressure 100/65, pulse 85, temperature 97.6 F (36.4 C), temperature source Temporal, resp. rate 18, height 5' 8 (1.727 m), weight 183 lb 4.8 oz (83.1 kg), SpO2 98%.    HEENT: Surgical defects at the parietal scalp with multiple eschars.  No palpable mass. Lymphatics: No cervical, supraclavicular, axillary, or inguinal nodes Resp: Distant breath sounds, inspiratory rales at the lower posterior chest bilaterally, no respiratory distress Cardio: Regular rate and rhythm GI: No hepatosplenomegaly Vascular: No leg edema  Skin: Multiple biopsy sites and keratoses over the face and extremities  Lab Results:  Lab Results  Component Value Date   WBC 7.3 09/08/2023   HGB 11.5 (L) 09/08/2023   HCT 35.3 (L) 09/08/2023   MCV 108.4 (H) 09/08/2023   PLT 261.0 09/08/2023   NEUTROABS 3.7 09/08/2023    CMP  Lab Results  Component Value Date   NA 139 09/08/2023   K 4.4 09/08/2023   CL 103 09/08/2023   CO2 31 09/08/2023   GLUCOSE 126 (H) 09/08/2023   BUN 17 09/08/2023   CREATININE 0.99 09/08/2023   CALCIUM  9.8 09/08/2023   PROT 6.4 (L) 06/24/2018   ALBUMIN 3.2 (L) 06/24/2018   AST 40 06/24/2018   ALT 49 (H) 06/24/2018   ALKPHOS 67 06/24/2018   BILITOT 0.8 06/24/2018   GFRNONAA >60 10/07/2018   GFRAA >60 10/07/2018    Lab Results  Component Value Date   CEA1 1.4  06/27/2018    Medications: I have reviewed the patient's current medications.   Assessment/Plan: Skull mass, chest lymphadenopathy MRI brain 06/24/2018-posterior vertex infiltrated and poorly marginated mass with extending into the underlying pachymeningeal's and into the scalp, the mass invades and narrows the superior sagittal sinus CTs of the chest, abdomen, and pelvis on 06/25/2018-acute bilateral pulmonary emboli with a large clot burden, necrotic mediastinal adenopathy, 8 mm pleural-based left lower lobe nodule, 4 mm left upper lobe nodules, chronic pulmonary fibrosis PET scan 07/06/2018- no evidence of a primary malignancy, small pulmonary nodules are not metabolically active, mildly enlarged/mildly metabolic mediastinal nodes favored to be reactive MRI brain 10/10/2018-progression of calvarial lesion at the vertex, stable 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  Pulmonary fibrosis.  Multiple nonspecific pulmonary nodules CT chest 08/29/2021-progressive interstitial lung disease, stable pulmonary nodules, no large mediastinal or hilar nodes CT chest 05/18/2023-no pathologic enlarged mediastinal or axillary nodes, pulmonary fibrosis 2.  NSTEMI-cardiac catheterization 06/24/2018 with 99% LAD stenosis, status post placement of a drug-eluting stent 3.  Bilateral pulmonary emboli on CT 06/25/2018- treated with heparin , now maintained on apixaban  Doppler 06/27/2018- acute left femoral vein thrombosis   4.  History of gastroesophageal reflux disease and Barrett's esophagus 5.  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  Rheumatoid arthritis, treated with Humira and methotrexate, currently  treated with Imuran and prednisone 7.  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  Pulmonary fibrosis noted on CT 06/25/2018 9.  Squamous cell carcinoma left cheek, skin cancer parietal scalp 2024          Disposition: Nicholas Webb appears stable.  He has a history of DVT/pulmonary embolism in 2020.  He was found to have a mass at the posterior skull vertex in April 2020.  There is no clinical evidence of a progressive malignancy.  He is followed by dermatology for skin cancers.  He will continue anticoagulation therapy.  Nicholas Webb would like to continue follow-up in the oncology clinic.  He will return for an office visit in 1 year.  Nicholas Hof, MD  12/31/2023  8:51 AM

## 2024-01-03 ENCOUNTER — Telehealth: Payer: Self-pay

## 2024-01-03 NOTE — Telephone Encounter (Signed)
 Please contact [facility] and request that the most recent office note be faxed over.

## 2024-01-17 DIAGNOSIS — M0589 Other rheumatoid arthritis with rheumatoid factor of multiple sites: Secondary | ICD-10-CM | POA: Diagnosis not present

## 2024-01-17 DIAGNOSIS — M810 Age-related osteoporosis without current pathological fracture: Secondary | ICD-10-CM | POA: Diagnosis not present

## 2024-01-17 DIAGNOSIS — Z79899 Other long term (current) drug therapy: Secondary | ICD-10-CM | POA: Diagnosis not present

## 2024-01-17 DIAGNOSIS — J849 Interstitial pulmonary disease, unspecified: Secondary | ICD-10-CM | POA: Diagnosis not present

## 2024-01-17 DIAGNOSIS — M150 Primary generalized (osteo)arthritis: Secondary | ICD-10-CM | POA: Diagnosis not present

## 2024-01-24 DIAGNOSIS — D649 Anemia, unspecified: Secondary | ICD-10-CM | POA: Diagnosis not present

## 2024-02-01 ENCOUNTER — Other Ambulatory Visit: Payer: Self-pay | Admitting: Oncology

## 2024-02-04 ENCOUNTER — Ambulatory Visit: Admitting: Podiatry

## 2024-02-14 ENCOUNTER — Telehealth: Payer: Self-pay | Admitting: Sports Medicine

## 2024-02-14 ENCOUNTER — Telehealth: Payer: Self-pay | Admitting: Oncology

## 2024-02-14 ENCOUNTER — Telehealth: Payer: Self-pay | Admitting: *Deleted

## 2024-02-14 DIAGNOSIS — I2699 Other pulmonary embolism without acute cor pulmonale: Secondary | ICD-10-CM

## 2024-02-14 NOTE — Telephone Encounter (Signed)
 Patient called to ask if he is eligible to get another shoulder injection in his left shoulder. Please advise.

## 2024-02-14 NOTE — Telephone Encounter (Signed)
 PT called to see GBS earlier than his appt in October.

## 2024-02-14 NOTE — Telephone Encounter (Signed)
 [name] called to report he wants to bring his recent labs done with Margarete Erasmo Hint, NP) for Dr. Cloretta to review. He feels he should decrease his Eliquis  dose. Would like an appointment to discuss. He will drop off labs today.

## 2024-02-14 NOTE — Telephone Encounter (Signed)
 Nicholas Webb brought in recent labs from PCP collection and expresses concern about being on Eliquis  due to decrease in Hgb with normal iron level. Asking for an appointment to discuss w/MD about potential reduction in his Eliquis  dose. H/O bilateral PE 06/25/2018. Labs left for MD review.

## 2024-02-15 NOTE — Telephone Encounter (Signed)
 Per Dr. Cloretta: Schedule for lab/OV on 03/04/23. Scheduling message sent and patient notified of plan via voice mail.

## 2024-02-16 NOTE — Progress Notes (Unsigned)
 [facility] [address] [place] [address] Phone: [phone]   Assessment and Plan:     1. Chronic left shoulder pain (Primary) -Chronic with exacerbation, subsequent visit - Recurrence of left shoulder pain x 1 week with patient failing immediate pain and weakness after lifting a heavy object at [facility].  Concern for rotator cuff tear based on pain, weakness, reduced ROM - We will start with conservative therapy and monitor patient's improvement.  If no significant improvement, we will advance to MRI versus ultrasound - Patient elected for subacromial CSI.  Tolerated well per note below.  Increased risk of bleeding with patient on chronic anticoagulation with Eliquis  - Do not recommend p.o. NSAIDs due to past medical history of chronic anticoagulation on Eliquis  - Start HEP for shoulder and rotator cuff  Procedure: Subacromial Injection Side: Left  Risks explained and consent was given verbally. The site was cleaned with alcohol prep. A steroid injection was performed from posterior approach using 2mL of 1% lidocaine  without epinephrine and 1mL of kenalog 40mg /ml. This was well tolerated.  Needle was removed, hemostasis achieved, and post injection instructions were explained.   Pt was advised to call or return to clinic if these symptoms worsen or fail to improve as anticipated.    15 additional minutes spent for educating Therapeutic Home Exercise Program.  This included exercises focusing on stretching, strengthening, with focus on eccentric aspects.   Long term goals include an improvement in range of motion, strength, endurance as well as avoiding reinjury. Patient's frequency would include in 1-2 times a day, 3-5 times a week for a duration of 6-12 weeks. Proper technique shown and discussed handout in great detail with ATC.  All questions were discussed and answered.    Pertinent previous records reviewed include  none   Follow Up: 4 weeks for reevaluation.  If no significant improvement, could obtain ultrasound versus MRI.  Could consider physical therapy   Subjective:   I, Nicholas Webb, am serving as a neurosurgeon for Doctor [employer]   Chief Complaint: Hip pain, back pain, knee pain, shoulder pain   HPI:    10/18/23 Pain in R knee and R hip for past 2 weeks. Constant pain in R GT. Using Tylenol  and topical analgesics.    Hx of R ACL repair in the 90s. Pain worse in knee when he puts more weight on his R leg/hip. Pain over lateral aspect.    11/04/2023 Patient states he is feeling the same   12/23/2023 Patient states thinks shot did great   02/17/2024 Patient states left shoulder flare. He reached to grab something that was to heavy    Relevant Historical Information: Chronic anticoagulation on Eliquis , history of PE and DVT, CAD  Additional pertinent review of systems negative.  Current Medications[1]   Objective:     Vitals:   02/17/24 0809  BP: 108/68  Pulse: 72  SpO2: 92%  Weight: 183 lb (83 kg)  Height: 5' 8 (1.727 m)      Body mass index is 27.83 kg/m.    Physical Exam:    Gen: Appears well, nad, nontoxic and pleasant Neuro:sensation intact, strength is 5/5, muscle tone wnl Skin: no suspicious lesion or defmority Psych: A&O, appropriate mood and affect  Left shoulder:  No deformity, swelling or muscle wasting No scapular winging FF  80, abd  80, int 20, ext 70 NTTP over the [place], clavicle, ac, coracoid, biceps groove, humerus, deltoid, trapezius, cervical  spine Special testing limited by limited ROM Negative Spurling's test bilat FROM of neck    Electronically signed by:  [facility] 9:11 AM 02/17/2024     [1]  Current Outpatient Medications:    atorvastatin  (LIPITOR ) 40 MG tablet, TAKE 1 TABLET(40 MG) BY MOUTH DAILY, Disp: 90 tablet, Rfl: 1   azaTHIOprine (IMURAN) 50 MG tablet, Take 50 mg by mouth 2 (two)  times daily., Disp: , Rfl:    denosumab  (PROLIA ) 60 MG/ML SOSY injection, Inject 60 mg into the skin every 6 (six) months., Disp: , Rfl:    ELIQUIS  5 MG TABS tablet, TAKE 1 TABLET(5 MG) BY MOUTH TWICE DAILY, Disp: 60 tablet, Rfl: 11   esomeprazole (NEXIUM) 20 MG capsule, Take 20 mg by mouth daily at 12 noon. , Disp: , Rfl:    folic acid  (FOLVITE ) 1 MG tablet, Take 1 mg by mouth daily. 1-2 daily, Disp: , Rfl:    methotrexate (RHEUMATREX) 2.5 MG tablet, Take 10 mg by mouth once a week., Disp: , Rfl:    Multiple Vitamins-Minerals (MULTIVITAMIN WITH MINERALS) tablet, Take 1 tablet by mouth daily., Disp: , Rfl:    Niacinamide-Zn-Cu-Methfo-Se-Cr (NICOTINAMIDE PO), Take 500 mg by mouth., Disp: , Rfl:    PREVIDENT 5000 PLUS 1.1 % CREA dental cream, Place 1 Application onto teeth as directed., Disp: , Rfl:

## 2024-02-17 ENCOUNTER — Ambulatory Visit: Admitting: Sports Medicine

## 2024-02-17 VITALS — BP 108/68 | HR 72 | Ht 68.0 in | Wt 183.0 lb

## 2024-02-17 DIAGNOSIS — G8929 Other chronic pain: Secondary | ICD-10-CM

## 2024-02-17 DIAGNOSIS — M25512 Pain in left shoulder: Secondary | ICD-10-CM | POA: Diagnosis not present

## 2024-02-17 NOTE — Patient Instructions (Signed)
 Shoulder HEP   4 week follow up

## 2024-02-26 NOTE — Progress Notes (Signed)
 Mr Nicholas Webb - sorry for delay in release of these results. Overall stable but hgb for first time in 5 years went below 12 by a hair. I see that you are having upcoming visit with Dr Cloretta. Please do discuss with him your chronic anemia that overall is the same x 5 years  Thnks  Dr JONELLE

## 2024-03-03 ENCOUNTER — Other Ambulatory Visit: Payer: Self-pay | Admitting: *Deleted

## 2024-03-03 ENCOUNTER — Inpatient Hospital Stay: Attending: Oncology

## 2024-03-03 ENCOUNTER — Inpatient Hospital Stay: Admitting: Oncology

## 2024-03-03 VITALS — BP 109/71 | HR 72 | Temp 98.0°F | Resp 18 | Wt 184.0 lb

## 2024-03-03 DIAGNOSIS — Z87898 Personal history of other specified conditions: Secondary | ICD-10-CM | POA: Insufficient documentation

## 2024-03-03 DIAGNOSIS — Z79899 Other long term (current) drug therapy: Secondary | ICD-10-CM | POA: Diagnosis not present

## 2024-03-03 DIAGNOSIS — M069 Rheumatoid arthritis, unspecified: Secondary | ICD-10-CM | POA: Diagnosis not present

## 2024-03-03 DIAGNOSIS — Z86711 Personal history of pulmonary embolism: Secondary | ICD-10-CM | POA: Insufficient documentation

## 2024-03-03 DIAGNOSIS — I2699 Other pulmonary embolism without acute cor pulmonale: Secondary | ICD-10-CM | POA: Diagnosis not present

## 2024-03-03 DIAGNOSIS — D649 Anemia, unspecified: Secondary | ICD-10-CM

## 2024-03-03 DIAGNOSIS — Z86718 Personal history of other venous thrombosis and embolism: Secondary | ICD-10-CM | POA: Insufficient documentation

## 2024-03-03 DIAGNOSIS — D7589 Other specified diseases of blood and blood-forming organs: Secondary | ICD-10-CM | POA: Diagnosis not present

## 2024-03-03 DIAGNOSIS — Z8719 Personal history of other diseases of the digestive system: Secondary | ICD-10-CM | POA: Diagnosis not present

## 2024-03-03 DIAGNOSIS — I2510 Atherosclerotic heart disease of native coronary artery without angina pectoris: Secondary | ICD-10-CM | POA: Insufficient documentation

## 2024-03-03 DIAGNOSIS — Z7952 Long term (current) use of systemic steroids: Secondary | ICD-10-CM | POA: Insufficient documentation

## 2024-03-03 DIAGNOSIS — I252 Old myocardial infarction: Secondary | ICD-10-CM | POA: Diagnosis not present

## 2024-03-03 LAB — CMP (CANCER CENTER ONLY)
ALT: 27 U/L (ref 0–44)
AST: 32 U/L (ref 15–41)
Albumin: 3.8 g/dL (ref 3.5–5.0)
Alkaline Phosphatase: 54 U/L (ref 38–126)
Anion gap: 9 (ref 5–15)
BUN: 18 mg/dL (ref 8–23)
CO2: 28 mmol/L (ref 22–32)
Calcium: 10 mg/dL (ref 8.9–10.3)
Chloride: 105 mmol/L (ref 98–111)
Creatinine: 0.99 mg/dL (ref 0.61–1.24)
GFR, Estimated: >60 mL/min
Glucose, Bld: 92 mg/dL (ref 70–99)
Potassium: 4.1 mmol/L (ref 3.5–5.1)
Sodium: 142 mmol/L (ref 135–145)
Total Bilirubin: 0.5 mg/dL (ref 0.0–1.2)
Total Protein: 9.3 g/dL — ABNORMAL HIGH (ref 6.5–8.1)

## 2024-03-03 LAB — RETICULOCYTES
Immature Retic Fract: 8.8 % (ref 2.3–15.9)
RBC.: 3.04 MIL/uL — ABNORMAL LOW (ref 4.22–5.81)
Retic Count, Absolute: 32.8 K/uL (ref 19.0–186.0)
Retic Ct Pct: 1.1 % (ref 0.4–3.1)

## 2024-03-03 LAB — CBC WITH DIFFERENTIAL (CANCER CENTER ONLY)
Abs Immature Granulocytes: 0.05 K/uL (ref 0.00–0.07)
Basophils Absolute: 0 K/uL (ref 0.0–0.1)
Basophils Relative: 0 %
Eosinophils Absolute: 0.2 K/uL (ref 0.0–0.5)
Eosinophils Relative: 3 %
HCT: 33.9 % — ABNORMAL LOW (ref 39.0–52.0)
Hemoglobin: 11 g/dL — ABNORMAL LOW (ref 13.0–17.0)
Immature Granulocytes: 1 %
Lymphocytes Relative: 36 %
Lymphs Abs: 2.7 K/uL (ref 0.7–4.0)
MCH: 35.5 pg — ABNORMAL HIGH (ref 26.0–34.0)
MCHC: 32.4 g/dL (ref 30.0–36.0)
MCV: 109.4 fL — ABNORMAL HIGH (ref 80.0–100.0)
Monocytes Absolute: 0.6 K/uL (ref 0.1–1.0)
Monocytes Relative: 8 %
Neutro Abs: 4 K/uL (ref 1.7–7.7)
Neutrophils Relative %: 52 %
Platelet Count: 194 K/uL (ref 150–400)
RBC: 3.1 MIL/uL — ABNORMAL LOW (ref 4.22–5.81)
RDW: 15.5 % (ref 11.5–15.5)
WBC Count: 7.7 K/uL (ref 4.0–10.5)
nRBC: 0 % (ref 0.0–0.2)

## 2024-03-03 LAB — VITAMIN B12: Vitamin B-12: 668 pg/mL (ref 180–914)

## 2024-03-03 MED ORDER — APIXABAN 2.5 MG PO TABS: 2.5000 mg | ORAL_TABLET | Freq: Two times a day (BID) | ORAL | 5 refills | Status: AC

## 2024-03-03 NOTE — Progress Notes (Signed)
 " [facility] OFFICE PROGRESS NOTE   Diagnosis: History of DVT/PE, anemia  INTERVAL HISTORY:   Nicholas Webb returns prior to a scheduled visit.  He continues methotrexate and azathioprine for treatment of rheumatoid arthritis.  He reports his rheumatologist is planning a switch to Actemra.  When he saw his primary provider on 01/24/2024 the hemoglobin returned at 10.5, hematocrit 31.4%, MCV 105.9, platelets 219,000, and WBC 6.1.  A vitamin B12 returned at 489.  The iron saturation returned at 29%.  He denies bleeding.  He denies symptoms of recurrent thrombosis.  He continues follow-up with dermatology for the history of skin cancer.  He would like to consider decreasing the dose of apixaban .  Objective:  Vital signs in last 24 hours:  Blood pressure 109/71, pulse 72, temperature 98 F (36.7 C), resp. rate 18, weight 184 lb (83.5 kg), SpO2 93%.    HEENT: No mass at the parietal scalp surgical site. Lymphatics: No cervical, supraclavicular, axillary, or inguinal nodes Resp: Inspiratory rhonchi at the posterior chest bilaterally Cardio: Regular rate and rhythm GI: No hepatosplenomegaly Vascular: No leg edema  Skin: Multiple nodular keratoses over the scalp, face, and extremities  Lab Results:  Lab Results  Component Value Date   WBC 7.7 03/03/2024   HGB 11.0 (L) 03/03/2024   HCT 33.9 (L) 03/03/2024   MCV 109.4 (H) 03/03/2024   PLT 194 03/03/2024   NEUTROABS 4.0 03/03/2024    CMP  Lab Results  Component Value Date   NA 139 09/08/2023   K 4.4 09/08/2023   CL 103 09/08/2023   CO2 31 09/08/2023   GLUCOSE 126 (H) 09/08/2023   BUN 17 09/08/2023   CREATININE 0.99 09/08/2023   CALCIUM  9.8 09/08/2023   PROT 6.4 (L) 06/24/2018   ALBUMIN 3.2 (L) 06/24/2018   AST 40 06/24/2018   ALT 49 (H) 06/24/2018   ALKPHOS 67 06/24/2018   BILITOT 0.8 06/24/2018   GFRNONAA >60 10/07/2018   GFRAA >60 10/07/2018    Lab Results  Component Value Date   CEA1 1.4  06/27/2018      Medications: I have reviewed the patient's current medications.   Assessment/Plan:  Skull mass, chest lymphadenopathy MRI brain 06/24/2018-posterior vertex infiltrated and poorly marginated mass with extending into the underlying pachymeningeal's and into the scalp, the mass invades and narrows the superior sagittal sinus CTs of the chest, abdomen, and pelvis on 06/25/2018-acute bilateral pulmonary emboli with a large clot burden, necrotic mediastinal adenopathy, 8 mm pleural-based left lower lobe nodule, 4 mm left upper lobe nodules, chronic pulmonary fibrosis PET scan 07/06/2018- no evidence of a primary malignancy, small pulmonary nodules are not metabolically active, mildly enlarged/mildly metabolic mediastinal nodes favored to be reactive MRI brain 10/10/2018-progression of calvarial lesion at the vertex, stable 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  Pulmonary fibrosis.  Multiple nonspecific pulmonary nodules CT chest 08/29/2021-progressive interstitial lung disease, stable pulmonary nodules, no large mediastinal or hilar nodes CT chest 05/18/2023-no pathologic enlarged mediastinal or axillary nodes, pulmonary fibrosis 2.  NSTEMI-cardiac catheterization 06/24/2018 with 99% LAD stenosis, status post placement of a drug-eluting stent 3.  Bilateral pulmonary emboli on CT 06/25/2018- treated with heparin , now maintained on apixaban  Doppler 06/27/2018- acute left femoral vein thrombosis   4.  History of gastroesophageal reflux disease and  Barrett's esophagus 5.  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  Rheumatoid arthritis, treated with Humira and methotrexate,  currently treated with Imuran and prednisone 7.  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  Pulmonary fibrosis noted on CT 06/25/2018 9.  Squamous cell carcinoma left cheek, skin cancer parietal scalp 2024        Disposition: Nicholas Webb has a remote history of DVT/PE.  He is maintained on long-term anticoagulation therapy.  We discussed the risk/benefit of reduced intensity anticoagulation.  He appears to be a candidate for reduced intensity anticoagulation.  He will begin apixaban  at a dose of 2.5 mg twice daily.  The anemia is likely secondary to chronic disease methotrexate.  The elevated MCV is likely secondary to methotrexate and azathioprine.  He could have subclinical bleeding while on apixaban .  He reports a recent stool Hemoccult was negative at [facility].  He plans to follow-up with rheumatology for treatment of rheumatoid arthritis.  He reports a change to Actemra is being considered.  He will continue laboratory follow-up with his rheumatologist and primary provider.  Nicholas Webb will return for an office visit in October.  I am available to see him sooner as needed.  Arley Hof, MD  03/03/2024  11:54 AM   "

## 2024-03-07 ENCOUNTER — Ambulatory Visit: Admitting: Sports Medicine

## 2024-03-07 VITALS — BP 108/76 | HR 67 | Ht 68.0 in | Wt 184.0 lb

## 2024-03-07 DIAGNOSIS — G8929 Other chronic pain: Secondary | ICD-10-CM

## 2024-03-07 DIAGNOSIS — S46012D Strain of muscle(s) and tendon(s) of the rotator cuff of left shoulder, subsequent encounter: Secondary | ICD-10-CM

## 2024-03-07 DIAGNOSIS — M25512 Pain in left shoulder: Secondary | ICD-10-CM

## 2024-03-07 NOTE — Patient Instructions (Addendum)
 Tylenol  650 mg 1-2 pill 2x a day   MRI referral   Pause HEP   Follow up 1 week after MRI to discuss results

## 2024-03-07 NOTE — Progress Notes (Signed)
 "               Odis Mace D.[facility] [address] [place] [address] Phone: [phone]   Assessment and Plan:     1. Chronic left shoulder pain (Primary) 2. Traumatic tear of left rotator cuff, unspecified tear extent, subsequent encounter -Chronic with exacerbation, subsequent visit - Continued left shoulder pain with weakness and reduced ROM after patient feeling immediate pain and weakness after lifting heavy object at [facility] in December 2025.  High concern for rotator cuff tear - Recommend left shoulder MRI due to high concern for rotator cuff tear based on HPI, physical exam, no significant improvement with conservative therapy, pain >6/10, pain with day-to-day activities - Improvement in pain after subacromial CSI, however HEP caused recurrent flare of shoulder pain.  Pause HEP until MRI can be reviewed at follow-up visit -Do not recommend p.o. NSAIDs due to chronic anticoagulation on Eliquis   Pertinent previous records reviewed include none   Follow Up: 1 week after MRI to review imaging.  Could consider alternative CSI versus physical therapy versus orthopedic surgery referral   Subjective:   I, Nicholas Webb, am serving as a neurosurgeon for Nicholas Webb   Chief Complaint: Hip pain, back pain, knee pain, shoulder pain   HPI:    10/18/23 Pain in R knee and R hip for past 2 weeks. Constant pain in R GT. Using Tylenol  and topical analgesics.    Hx of R ACL repair in the 90s. Pain worse in knee when he puts more weight on his R leg/hip. Pain over lateral aspect.    11/04/2023 Patient states he is feeling the same   12/23/2023 Patient states thinks shot did great    02/17/2024 Patient states left shoulder flare. He reached to grab something that was to heavy   03/07/2024 Patient states he has notice some increased pain when done HEP. Pain is radiating to his hand      Relevant Historical Information: Chronic anticoagulation on  Eliquis , history of PE and DVT, CAD  Additional pertinent review of systems negative.  Current Medications[1]   Objective:     Vitals:   03/07/24 1503  BP: 108/76  Pulse: 67  SpO2: 96%  Weight: 184 lb (83.5 kg)  Height: 5' 8 (1.727 m)      Body mass index is 27.98 kg/m.    Physical Exam:    Gen: Appears well, nad, nontoxic and pleasant Neuro:sensation intact, strength is 5/5, muscle tone wnl Skin: no suspicious lesion or defmority Psych: A&O, appropriate mood and affect   Left shoulder:  No deformity, swelling or muscle wasting No scapular winging FF  80, abd  80, int 20, ext 70 NTTP over the [place], clavicle, ac, coracoid, biceps groove, humerus, deltoid, trapezius, cervical spine Special testing limited by limited ROM Negative Spurling's test bilat FROM of neck     Electronically signed by:  Odis Mace D[facility] 3:24 PM 03/07/2024     [1]  Current Outpatient Medications:    atorvastatin  (LIPITOR ) 40 MG tablet, TAKE 1 TABLET(40 MG) BY MOUTH DAILY, Disp: 90 tablet, Rfl: 1   azaTHIOprine (IMURAN) 50 MG tablet, Take 50 mg by mouth 2 (two) times daily., Disp: , Rfl:    denosumab  (PROLIA ) 60 MG/ML SOSY injection, Inject 60 mg into the skin every 6 (six) months., Disp: , Rfl:    esomeprazole (NEXIUM) 20 MG capsule, Take 20 mg by mouth daily at 12 noon. ,  Disp: , Rfl:    folic acid  (FOLVITE ) 1 MG tablet, Take 1 mg by mouth daily. 1-2 daily, Disp: , Rfl:    methotrexate (RHEUMATREX) 2.5 MG tablet, Take 10 mg by mouth once a week., Disp: , Rfl:    Multiple Vitamins-Minerals (MULTIVITAMIN WITH MINERALS) tablet, Take 1 tablet by mouth daily., Disp: , Rfl:    Niacinamide-Zn-Cu-Methfo-Se-Cr (NICOTINAMIDE PO), Take 500 mg by mouth., Disp: , Rfl:    PREVIDENT 5000 PLUS 1.1 % CREA dental cream, Place 1 Application onto teeth as directed., Disp: , Rfl:    apixaban  (ELIQUIS ) 2.5 MG TABS tablet, Take 1 tablet (2.5 mg total) by mouth 2 (two) times daily.,  Disp: 60 tablet, Rfl: 5   Tocilizumab (ACTEMRA) 162 MG/0.9ML SOSY, Inject 162 mg into the skin every 14 (fourteen) days. (Patient not taking: Reported on 03/03/2024), Disp: , Rfl:   "

## 2024-03-08 ENCOUNTER — Telehealth: Payer: Self-pay | Admitting: *Deleted

## 2024-03-08 ENCOUNTER — Other Ambulatory Visit: Payer: Self-pay | Admitting: Cardiology

## 2024-03-08 DIAGNOSIS — I2510 Atherosclerotic heart disease of native coronary artery without angina pectoris: Secondary | ICD-10-CM

## 2024-03-08 NOTE — Telephone Encounter (Signed)
 Mr. Delph called to report that Dr. Leeroy High has not received his lab results yet from our office. Routed labs to Dr. High.

## 2024-03-09 ENCOUNTER — Ambulatory Visit: Admitting: Sports Medicine

## 2024-03-16 ENCOUNTER — Ambulatory Visit: Admitting: Sports Medicine

## 2024-03-20 ENCOUNTER — Ambulatory Visit: Admitting: Podiatry

## 2024-03-20 ENCOUNTER — Encounter: Payer: Self-pay | Admitting: Podiatry

## 2024-03-20 DIAGNOSIS — B351 Tinea unguium: Secondary | ICD-10-CM

## 2024-03-20 DIAGNOSIS — D689 Coagulation defect, unspecified: Secondary | ICD-10-CM | POA: Diagnosis not present

## 2024-03-20 DIAGNOSIS — M79676 Pain in unspecified toe(s): Secondary | ICD-10-CM

## 2024-03-20 NOTE — Progress Notes (Signed)
 This patient returns to my office for at risk foot care.  This patient requires this care by a professional since this patient will be at risk due to having coagulation defect.  Patient is taking eliquis .  This patient is unable to cut nails himself since the patient cannot reach his nails.These nails are painful walking and wearing shoes.  This patient presents for at risk foot care today.  General Appearance  Alert, conversant and in no acute stress.  Vascular  Dorsalis pedis and posterior tibial  pulses are palpable  bilaterally.  Capillary return is within normal limits  bilaterally. Temperature is within normal limits  bilaterally.  Neurologic  Senn-Weinstein monofilament wire test within normal limits  bilaterally. Muscle power within normal limits bilaterally.  Nails Thick disfigured discolored nails with subungual debris  from hallux to fifth toes bilaterally. No evidence of bacterial infection or drainage bilaterally.  Orthopedic  No limitations of motion  feet .  No crepitus or effusions noted.  No bony pathology or digital deformities noted.   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 Bony prominence medial aspect both rearfeet.  Pes planus right foot.  Skin  normotropic skin with no porokeratosis noted bilaterally.  No signs of ulcers noted.   Onychomycosis  Pain in right toes  Pain in left toes  Consent was obtained for treatment procedures.   Mechanical debridement of nails 1-5  bilaterally performed with a nail nipper.  Filed with dremel without incident.    Return office visit    3 months                Told patient to return for periodic foot care and evaluation due to potential at risk complications.   Cordella Bold DPM

## 2024-03-22 ENCOUNTER — Ambulatory Visit
Admission: RE | Admit: 2024-03-22 | Discharge: 2024-03-22 | Disposition: A | Source: Ambulatory Visit | Attending: Sports Medicine

## 2024-03-22 DIAGNOSIS — G8929 Other chronic pain: Secondary | ICD-10-CM

## 2024-03-22 DIAGNOSIS — S46012D Strain of muscle(s) and tendon(s) of the rotator cuff of left shoulder, subsequent encounter: Secondary | ICD-10-CM

## 2024-03-24 ENCOUNTER — Ambulatory Visit: Payer: Self-pay | Admitting: Sports Medicine

## 2024-03-28 NOTE — Progress Notes (Unsigned)
 "               [facility] [address] [place] [address] Phone: [phone]   Assessment and Plan:     1. Chronic left shoulder pain (Primary) 2. Traumatic complete tear of left rotator cuff, subsequent encounter 3. Primary osteoarthritis of left shoulder -Chronic with exacerbation, subsequent visit - Overall improvement in left shoulder pain.  Consistent with acute traumatic tear of supraspinatus muscle with underlying degenerative changes including glenohumeral osteoarthritis, AC osteoarthritis, partial infraspinatus tendon tearing, tendinopathy, high riding humeral head - Patient overall is doing well with maintained range of motion and minimal pain that is well-controlled with Tylenol .  Based on patient's adequate function, and well-controlled pain, I do not feel it is necessary for him to have orthopedic surgery referral at this time.  Could consider orthopedic surgery referral in the future if symptoms do not improve. - Patient has benefited from subacromial CSI in the past.  Due to acute rotator cuff tear, I do not recommend repeat subacromial CSI at this time.  Could consider in the future, waiting at least 2 to 3 months to allow for tendon tear to settle - Use Tylenol  500 to 1000 mg tablets 2-3 times a day for day-to-day pain relief     Pertinent previous records reviewed include left shoulder MRI 03/22/2024 IMPRESSION: 1. Complete retracted tear of the supraspinatus tendon with retraction to the level of the humeral head. 2. Partial-thickness insertional tear of the subscapularis tendon with small interstitial delaminating component and moderate tendinosis. 3. Bursal sided fraying of the infraspinatus tendon with moderate tendinosis. 4. The intra-articular biceps tendon is not visualized and may be torn and retracted. 5. Moderate atrophy of the infraspinatus muscle. Mild atrophy of the supraspinatus muscle. 6. High-riding humeral  head. 7. Small glenohumeral joint effusion with synovitis in the axillary recess. 8. Diffuse labral degeneration with tear of the superior labrum extending posteriorly. 9. Mild glenohumeral joint osteoarthritis. 10. Moderate to advanced arthropathy of the acromioclavicular joint.   Follow Up: As needed.  Could consider repeat CSI versus orthopedic surgery referral   Subjective:   I, Coraline Talwar, am serving as a neurosurgeon for [employer]   Chief Complaint: Hip pain, back pain, knee pain, shoulder pain   HPI:    10/18/23 Pain in R knee and R hip for past 2 weeks. Constant pain in R GT. Using Tylenol  and topical analgesics.    Hx of R ACL repair in the 90s. Pain worse in knee when he puts more weight on his R leg/hip. Pain over lateral aspect.    11/04/2023 Patient states he is feeling the same   12/23/2023 Patient states thinks shot did great    02/17/2024 Patient states left shoulder flare. He reached to grab something that was to heavy    03/07/2024 Patient states he has notice some increased pain when done HEP. Pain is radiating to his hand     03/29/2024 Patient states he thinks he has improved    Relevant Historical Information: Chronic anticoagulation on Eliquis , history of PE and DVT, CAD  Additional pertinent review of systems negative.  Current Medications[1]   Objective:     Vitals:   03/29/24 1507  BP: 102/80  Pulse: 89  SpO2: 95%  Weight: 182 lb (82.6 kg)  Height: 5' 8 (1.727 m)      Body mass index is 27.67 kg/m.    Physical Exam:    Gen:  Appears well, nad, nontoxic and pleasant Neuro:sensation intact, strength is 5/5, muscle tone wnl Skin: no suspicious lesion or defmority Psych: A&O, appropriate mood and affect   Left shoulder:  No deformity, swelling or muscle wasting No scapular winging FF  150, abd  150, int 10, ext 80 NTTP over the [place], clavicle, ac, coracoid, biceps groove, humerus, deltoid, trapezius, cervical  spine Negative Spurling's test bilat FROM of neck    Electronically signed by:  [facility] 3:22 PM 03/29/24     [1]  Current Outpatient Medications:    apixaban  (ELIQUIS ) 2.5 MG TABS tablet, Take 1 tablet (2.5 mg total) by mouth 2 (two) times daily., Disp: 60 tablet, Rfl: 5   atorvastatin  (LIPITOR ) 40 MG tablet, TAKE 1 TABLET(40 MG) BY MOUTH DAILY, Disp: 90 tablet, Rfl: 3   azaTHIOprine (IMURAN) 50 MG tablet, Take 50 mg by mouth 2 (two) times daily., Disp: , Rfl:    denosumab  (PROLIA ) 60 MG/ML SOSY injection, Inject 60 mg into the skin every 6 (six) months., Disp: , Rfl:    esomeprazole (NEXIUM) 20 MG capsule, Take 20 mg by mouth daily at 12 noon. , Disp: , Rfl:    folic acid  (FOLVITE ) 1 MG tablet, Take 1 mg by mouth daily. 1-2 daily, Disp: , Rfl:    methotrexate (RHEUMATREX) 2.5 MG tablet, Take 10 mg by mouth once a week., Disp: , Rfl:    Multiple Vitamins-Minerals (MULTIVITAMIN WITH MINERALS) tablet, Take 1 tablet by mouth daily., Disp: , Rfl:    Niacinamide-Zn-Cu-Methfo-Se-Cr (NICOTINAMIDE PO), Take 500 mg by mouth., Disp: , Rfl:    PREVIDENT 5000 PLUS 1.1 % CREA dental cream, Place 1 Application onto teeth as directed., Disp: , Rfl:    Tocilizumab (ACTEMRA) 162 MG/0.9ML SOSY, Inject 162 mg into the skin every 14 (fourteen) days., Disp: , Rfl:   "

## 2024-03-29 ENCOUNTER — Ambulatory Visit: Admitting: Sports Medicine

## 2024-03-29 VITALS — BP 102/80 | HR 89 | Ht 68.0 in | Wt 182.0 lb

## 2024-03-29 DIAGNOSIS — G8929 Other chronic pain: Secondary | ICD-10-CM

## 2024-03-29 DIAGNOSIS — M19012 Primary osteoarthritis, left shoulder: Secondary | ICD-10-CM

## 2024-03-29 DIAGNOSIS — S46012D Strain of muscle(s) and tendon(s) of the rotator cuff of left shoulder, subsequent encounter: Secondary | ICD-10-CM

## 2024-03-29 DIAGNOSIS — M25512 Pain in left shoulder: Secondary | ICD-10-CM | POA: Diagnosis not present

## 2024-03-29 NOTE — Patient Instructions (Signed)
 Thank you for coming in today.  We discussed your MRI results.  The full results are listed below.  The take away is that there is a new complete rotator cuff tear of the supraspinatus muscle.  There are multiple chronic findings including partial tendon tearing, tendinitis, osteoarthritis.  Since you currently have good range of motion and minimal pain that is well-controlled, you do not have to have surgery.  If your symptoms worsen, please reach out to our clinic and we can refer you to orthopedic surgery.  Could consider repeat injection in the future, though I would recommend waiting at least 2 to 3 months to allow for full healing of rotator cuff tear prior to repeat injection.  - Use Tylenol  500 to 1000 mg tablets 2-3 times a day for day-to-day pain relief     Left shoulder MRI IMPRESSION: 1. Complete retracted tear of the supraspinatus tendon with retraction to the level of the humeral head. 2. Partial-thickness insertional tear of the subscapularis tendon with small interstitial delaminating component and moderate tendinosis. 3. Bursal sided fraying of the infraspinatus tendon with moderate tendinosis. 4. The intra-articular biceps tendon is not visualized and may be torn and retracted. 5. Moderate atrophy of the infraspinatus muscle. Mild atrophy of the supraspinatus muscle. 6. High-riding humeral head. 7. Small glenohumeral joint effusion with synovitis in the axillary recess. 8. Diffuse labral degeneration with tear of the superior labrum extending posteriorly. 9. Mild glenohumeral joint osteoarthritis. 10. Moderate to advanced arthropathy of the acromioclavicular joint.

## 2024-05-11 ENCOUNTER — Encounter

## 2024-06-19 ENCOUNTER — Ambulatory Visit: Admitting: Podiatry

## 2024-07-11 ENCOUNTER — Ambulatory Visit: Admitting: Internal Medicine

## 2024-12-28 ENCOUNTER — Inpatient Hospital Stay: Admitting: Oncology

## 2024-12-28 ENCOUNTER — Inpatient Hospital Stay
